# Patient Record
Sex: Male | Born: 1990 | Race: White | Hispanic: No | Marital: Single | Smoking: Current some day smoker
Health system: Southern US, Community
[De-identification: ages and names within clinical notes are randomized; demographics above are authoritative.]

## PROBLEM LIST (undated history)

## (undated) DIAGNOSIS — F209 Schizophrenia, unspecified: Secondary | ICD-10-CM

---
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
## 2012-05-23 ENCOUNTER — Emergency Department: Payer: Self-pay | Admitting: Emergency Medicine

## 2014-07-01 ENCOUNTER — Emergency Department: Payer: Self-pay | Admitting: Emergency Medicine

## 2014-11-02 ENCOUNTER — Emergency Department: Payer: Self-pay | Admitting: Internal Medicine

## 2014-11-02 LAB — URINALYSIS, COMPLETE
Bacteria: NONE SEEN
Bilirubin,UR: NEGATIVE
Blood: NEGATIVE
GLUCOSE, UR: NEGATIVE mg/dL (ref 0–75)
KETONE: NEGATIVE
Leukocyte Esterase: NEGATIVE
NITRITE: NEGATIVE
Ph: 6 (ref 4.5–8.0)
RBC,UR: 1 /HPF (ref 0–5)
Specific Gravity: 1.023 (ref 1.003–1.030)
Squamous Epithelial: <1

## 2014-11-02 LAB — COMPREHENSIVE METABOLIC PANEL
Albumin: 4.5 g/dL (ref 3.4–5.0)
Alkaline Phosphatase: 89 U/L
Anion Gap: 6 — ABNORMAL LOW (ref 7–16)
BUN: 14 mg/dL (ref 7–18)
Bilirubin,Total: 1.1 mg/dL — ABNORMAL HIGH (ref 0.2–1.0)
CHLORIDE: 103 mmol/L (ref 98–107)
Calcium, Total: 9.4 mg/dL (ref 8.5–10.1)
Co2: 26 mmol/L (ref 21–32)
Creatinine: 0.93 mg/dL (ref 0.60–1.30)
EGFR (African American): >60
EGFR (Non-African Amer.): >60
Glucose: 98 mg/dL (ref 65–99)
OSMOLALITY: 271 (ref 275–301)
POTASSIUM: 4.2 mmol/L (ref 3.5–5.1)
SGOT(AST): 19 U/L (ref 15–37)
SGPT (ALT): 29 U/L
Sodium: 135 mmol/L — ABNORMAL LOW (ref 136–145)
Total Protein: 8.1 g/dL (ref 6.4–8.2)

## 2014-11-02 LAB — CBC WITH DIFFERENTIAL/PLATELET
Basophil #: 0 10*3/uL (ref 0.0–0.1)
Basophil %: 0.4 %
EOS PCT: 0.6 %
Eosinophil #: 0.1 10*3/uL (ref 0.0–0.7)
HCT: 50.3 % (ref 40.0–52.0)
HGB: 16.9 g/dL (ref 13.0–18.0)
LYMPHS ABS: 2.3 10*3/uL (ref 1.0–3.6)
Lymphocyte %: 19 %
MCH: 29.6 pg (ref 26.0–34.0)
MCHC: 33.6 g/dL (ref 32.0–36.0)
MCV: 88 fL (ref 80–100)
Monocyte #: 0.8 x10 3/mm (ref 0.2–1.0)
Monocyte %: 6.3 %
Neutrophil #: 8.8 10*3/uL — ABNORMAL HIGH (ref 1.4–6.5)
Neutrophil %: 73.7 %
PLATELETS: 271 10*3/uL (ref 150–440)
RBC: 5.72 10*6/uL (ref 4.40–5.90)
RDW: 14.2 % (ref 11.5–14.5)
WBC: 11.9 10*3/uL — AB (ref 3.8–10.6)

## 2015-03-05 ENCOUNTER — Encounter (HOSPITAL_COMMUNITY): Payer: Self-pay | Admitting: Emergency Medicine

## 2015-03-05 ENCOUNTER — Emergency Department (HOSPITAL_COMMUNITY): Payer: No Typology Code available for payment source

## 2015-03-05 ENCOUNTER — Emergency Department (HOSPITAL_COMMUNITY)
Admission: EM | Admit: 2015-03-05 | Discharge: 2015-03-05 | Disposition: A | Discharge status: Home / Self Care | Payer: No Typology Code available for payment source | Attending: Emergency Medicine | Admitting: Emergency Medicine

## 2015-03-05 DIAGNOSIS — Y9389 Activity, other specified: Secondary | ICD-10-CM | POA: Diagnosis not present

## 2015-03-05 DIAGNOSIS — S3992XA Unspecified injury of lower back, initial encounter: Secondary | ICD-10-CM | POA: Diagnosis not present

## 2015-03-05 DIAGNOSIS — S3991XA Unspecified injury of abdomen, initial encounter: Secondary | ICD-10-CM | POA: Diagnosis not present

## 2015-03-05 DIAGNOSIS — F10129 Alcohol abuse with intoxication, unspecified: Secondary | ICD-10-CM | POA: Diagnosis not present

## 2015-03-05 DIAGNOSIS — Z720 Tobacco use: Secondary | ICD-10-CM | POA: Diagnosis not present

## 2015-03-05 DIAGNOSIS — Y998 Other external cause status: Secondary | ICD-10-CM | POA: Insufficient documentation

## 2015-03-05 DIAGNOSIS — S29092A Other injury of muscle and tendon of back wall of thorax, initial encounter: Secondary | ICD-10-CM | POA: Insufficient documentation

## 2015-03-05 DIAGNOSIS — S0990XA Unspecified injury of head, initial encounter: Secondary | ICD-10-CM

## 2015-03-05 DIAGNOSIS — Y92410 Unspecified street and highway as the place of occurrence of the external cause: Secondary | ICD-10-CM | POA: Insufficient documentation

## 2015-03-05 DIAGNOSIS — M549 Dorsalgia, unspecified: Secondary | ICD-10-CM

## 2015-03-05 LAB — CBC
HCT: 43.2 % (ref 39.0–52.0)
HEMOGLOBIN: 15.1 g/dL (ref 13.0–17.0)
MCH: 29.6 pg (ref 26.0–34.0)
MCHC: 35 g/dL (ref 30.0–36.0)
MCV: 84.7 fL (ref 78.0–100.0)
Platelets: 225 10*3/uL (ref 150–400)
RBC: 5.1 MIL/uL (ref 4.22–5.81)
RDW: 12.9 % (ref 11.5–15.5)
WBC: 7.9 10*3/uL (ref 4.0–10.5)

## 2015-03-05 LAB — COMPREHENSIVE METABOLIC PANEL
ALK PHOS: 81 U/L (ref 39–117)
ALT: 17 U/L (ref 0–53)
AST: 20 U/L (ref 0–37)
Albumin: 4.2 g/dL (ref 3.5–5.2)
Anion gap: 9 (ref 5–15)
BILIRUBIN TOTAL: 0.5 mg/dL (ref 0.3–1.2)
BUN: 10 mg/dL (ref 6–23)
CO2: 22 mmol/L (ref 19–32)
CREATININE: 1.16 mg/dL (ref 0.50–1.35)
Calcium: 8.5 mg/dL (ref 8.4–10.5)
Chloride: 109 mmol/L (ref 96–112)
GFR calc Af Amer: >90 mL/min (ref >90)
GFR calc non Af Amer: 89 mL/min — ABNORMAL LOW (ref >90)
Glucose, Bld: 91 mg/dL (ref 70–99)
POTASSIUM: 3.7 mmol/L (ref 3.5–5.1)
Sodium: 140 mmol/L (ref 135–145)
Total Protein: 6.8 g/dL (ref 6.0–8.3)

## 2015-03-05 LAB — ETHANOL: Alcohol, Ethyl (B): 211 mg/dL — ABNORMAL HIGH (ref 0–9)

## 2015-03-05 LAB — PROTIME-INR
INR: 1.04 (ref 0.00–1.49)
PROTHROMBIN TIME: 13.7 s (ref 11.6–15.2)

## 2015-03-05 LAB — SAMPLE TO BLOOD BANK

## 2015-03-05 MED ORDER — IOHEXOL 300 MG/ML  SOLN
100.0000 mL | Freq: Once | INTRAMUSCULAR | Status: AC | PRN
  Administered 2015-03-05: 100 mL via INTRAVENOUS

## 2015-03-05 MED ORDER — IBUPROFEN 800 MG PO TABS
800.0000 mg | ORAL_TABLET | Freq: Once | ORAL | Status: AC
  Administered 2015-03-05: 800 mg via ORAL
  Filled 2015-03-05: qty 1

## 2015-03-05 MED ORDER — NAPROXEN 500 MG PO TABS: 500.0000 mg | ORAL_TABLET | Freq: Two times a day (BID) | ORAL | Status: DC

## 2015-03-05 MED ORDER — SODIUM CHLORIDE 0.9 % IV BOLUS (SEPSIS)
1000.0000 mL | Freq: Once | INTRAVENOUS | Status: AC
  Administered 2015-03-05: 1000 mL via INTRAVENOUS

## 2015-03-05 MED ORDER — FENTANYL CITRATE 0.05 MG/ML IJ SOLN
50.0000 ug | INTRAMUSCULAR | Status: DC | PRN
  Administered 2015-03-05 (×2): 50 ug via INTRAVENOUS
  Filled 2015-03-05 (×2): qty 2

## 2015-03-05 MED ORDER — HYDROCODONE-ACETAMINOPHEN 5-325 MG PO TABS
2.0000 | ORAL_TABLET | Freq: Once | ORAL | Status: AC
  Administered 2015-03-05: 2 via ORAL
  Filled 2015-03-05: qty 2
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
## 2015-03-05 NOTE — Discharge Instructions (Signed)
If you were given medicines take as directed.  If you are on coumadin or contraceptives realize their levels and effectiveness is altered by many different medicines.  If you have any reaction (rash, tongues swelling, other) to the medicines stop taking and see a physician.   Please follow up as directed and return to the ER or see a physician for new or worsening symptoms.  Thank you. Filed Vitals:   03/05/15 1925 03/05/15 1927 03/05/15 2312  BP:  118/64 109/45  Pulse:  104 75  Temp:  98.7 F (37.1 C) 98.4 F (36.9 C)  TempSrc:  Oral Oral  Resp:  14 22  Height:  5\' 8"  (1.727 m)   Weight:  165 lb (74.844 kg)   SpO2: 96% 100% 99%
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
## 2015-03-07 ENCOUNTER — Encounter (HOSPITAL_COMMUNITY): Payer: Self-pay

## 2015-03-07 ENCOUNTER — Emergency Department (HOSPITAL_COMMUNITY)
Admission: EM | Admit: 2015-03-07 | Discharge: 2015-03-07 | Disposition: A | Discharge status: Home / Self Care | Payer: Self-pay | Attending: Emergency Medicine | Admitting: Emergency Medicine

## 2015-03-07 DIAGNOSIS — R110 Nausea: Secondary | ICD-10-CM

## 2015-03-07 DIAGNOSIS — Y92410 Unspecified street and highway as the place of occurrence of the external cause: Secondary | ICD-10-CM | POA: Insufficient documentation

## 2015-03-07 DIAGNOSIS — R51 Headache: Secondary | ICD-10-CM | POA: Insufficient documentation

## 2015-03-07 DIAGNOSIS — S39012A Strain of muscle, fascia and tendon of lower back, initial encounter: Secondary | ICD-10-CM

## 2015-03-07 DIAGNOSIS — R519 Headache, unspecified: Secondary | ICD-10-CM

## 2015-03-07 DIAGNOSIS — Y998 Other external cause status: Secondary | ICD-10-CM | POA: Insufficient documentation

## 2015-03-07 DIAGNOSIS — Y9389 Activity, other specified: Secondary | ICD-10-CM | POA: Insufficient documentation

## 2015-03-07 DIAGNOSIS — Z720 Tobacco use: Secondary | ICD-10-CM | POA: Insufficient documentation

## 2015-03-07 MED ORDER — ONDANSETRON 4 MG PO TBDP: ORAL_TABLET | ORAL | Status: DC

## 2015-03-07 MED ORDER — OXYCODONE-ACETAMINOPHEN 5-325 MG PO TABS: 2.0000 | ORAL_TABLET | Freq: Four times a day (QID) | ORAL | Status: DC | PRN

## 2015-03-07 MED ORDER — ONDANSETRON 4 MG PO TBDP
4.0000 mg | ORAL_TABLET | Freq: Once | ORAL | Status: AC
  Administered 2015-03-07: 4 mg via ORAL
  Filled 2015-03-07: qty 1

## 2015-03-07 MED ORDER — OXYCODONE-ACETAMINOPHEN 5-325 MG PO TABS
2.0000 | ORAL_TABLET | Freq: Once | ORAL | Status: AC
  Administered 2015-03-07: 2 via ORAL
  Filled 2015-03-07: qty 2

## 2015-03-07 MED ORDER — CYCLOBENZAPRINE HCL 10 MG PO TABS: 10.0000 mg | ORAL_TABLET | Freq: Two times a day (BID) | ORAL | Status: DC | PRN
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
## 2015-09-21 ENCOUNTER — Emergency Department
Admission: EM | Admit: 2015-09-21 | Discharge: 2015-09-21 | Disposition: A | Discharge status: Home / Self Care | Payer: BLUE CROSS/BLUE SHIELD | Attending: Emergency Medicine | Admitting: Emergency Medicine

## 2015-09-21 ENCOUNTER — Emergency Department: Payer: BLUE CROSS/BLUE SHIELD

## 2015-09-21 ENCOUNTER — Encounter: Payer: Self-pay | Admitting: *Deleted

## 2015-09-21 DIAGNOSIS — M25571 Pain in right ankle and joints of right foot: Secondary | ICD-10-CM | POA: Diagnosis not present

## 2015-09-21 DIAGNOSIS — Z720 Tobacco use: Secondary | ICD-10-CM | POA: Insufficient documentation

## 2015-09-21 DIAGNOSIS — Z79899 Other long term (current) drug therapy: Secondary | ICD-10-CM | POA: Diagnosis not present

## 2015-09-21 MED ORDER — NAPROXEN 500 MG PO TABS: 500.0000 mg | ORAL_TABLET | Freq: Two times a day (BID) | ORAL | Status: DC

## 2015-09-21 MED ORDER — IBUPROFEN 800 MG PO TABS
800.0000 mg | ORAL_TABLET | Freq: Once | ORAL | Status: AC
  Administered 2015-09-21: 800 mg via ORAL
  Filled 2015-09-21: qty 1

## 2015-09-21 MED ORDER — TRAMADOL HCL 50 MG PO TABS
50.0000 mg | ORAL_TABLET | Freq: Once | ORAL | Status: AC
  Administered 2015-09-21: 50 mg via ORAL
  Filled 2015-09-21: qty 1

## 2015-09-21 NOTE — Discharge Instructions (Signed)
Wear ankle support for 3-5 days as needed. Ankle Pain Ankle pain is a common symptom. The bones, cartilage, tendons, and muscles of the ankle joint perform a lot of work each day. The ankle joint holds your body weight and allows you to move around. Ankle pain can occur on either side or back of 1 or both ankles. Ankle pain may be sharp and burning or dull and aching. There may be tenderness, stiffness, redness, or warmth around the ankle. The pain occurs more often when a person walks or puts pressure on the ankle. CAUSES  There are many reasons ankle pain can develop. It is important to work with your caregiver to identify the cause since many conditions can impact the bones, cartilage, muscles, and tendons. Causes for ankle pain include:  Injury, including a break (fracture), sprain, or strain often due to a fall, sports, or a high-impact activity.  Swelling (inflammation) of a tendon (tendonitis).  Achilles tendon rupture.  Ankle instability after repeated sprains and strains.  Poor foot alignment.  Pressure on a nerve (tarsal tunnel syndrome).  Arthritis in the ankle or the lining of the ankle.  Crystal formation in the ankle (gout or pseudogout). DIAGNOSIS  A diagnosis is based on your medical history, your symptoms, results of your physical exam, and results of diagnostic tests. Diagnostic tests may include X-ray exams or a computerized magnetic scan (magnetic resonance imaging, MRI). TREATMENT  Treatment will depend on the cause of your ankle pain and may include:  Keeping pressure off the ankle and limiting activities.  Using crutches or other walking support (a cane or brace).  Using rest, ice, compression, and elevation.  Participating in physical therapy or home exercises.  Wearing shoe inserts or special shoes.  Losing weight.  Taking medications to reduce pain or swelling or receiving an injection.  Undergoing surgery. HOME CARE INSTRUCTIONS   Only take  over-the-counter or prescription medicines for pain, discomfort, or fever as directed by your caregiver.  Put ice on the injured area.  Put ice in a plastic bag.  Place a towel between your skin and the bag.  Leave the ice on for 15-20 minutes at a time, 03-04 times a day.  Keep your leg raised (elevated) when possible to lessen swelling.  Avoid activities that cause ankle pain.  Follow specific exercises as directed by your caregiver.  Record how often you have ankle pain, the location of the pain, and what it feels like. This information may be helpful to you and your caregiver.  Ask your caregiver about returning to work or sports and whether you should drive.  Follow up with your caregiver for further examination, therapy, or testing as directed. SEEK MEDICAL CARE IF:   Pain or swelling continues or worsens beyond 1 week.  You have an oral temperature above 102 F (38.9 C).  You are feeling unwell or have chills.  You are having an increasingly difficult time with walking.  You have loss of sensation or other new symptoms.  You have questions or concerns. MAKE SURE YOU:   Understand these instructions.  Will watch your condition.  Will get help right away if you are not doing well or get worse.   This information is not intended to replace advice given to you by your health care provider. Make sure you discuss any questions you have with your health care provider.   Document Released: 05/09/2010 Document Revised: 02/11/2012 Document Reviewed: 06/21/2015 Elsevier Interactive Patient Education [employer]2016 [employer]
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
## 2015-09-21 NOTE — ED Provider Notes (Signed)
[facility] Emergency Department Provider Note  ____________________________________________  Time seen: Approximately 11:33 AM  I have reviewed the triage vital signs and the nursing notes.   HISTORY  Chief Complaint Ankle Pain    HPI Jeremiah Newman is a 24 y.o. male patient complaining of right ankle pain and edema. Patient state he was standing this morning and felt like ankle collapsed. Patient has surgery 1 year ago to the right ankle. Patient denies any internal fixation. Patient is rating his pain as a 7/10. No palliative measures taken for this complaint.   History reviewed. No pertinent past medical history.  There are no active problems to display for this patient.   History reviewed. No pertinent past surgical history.  Current Outpatient Rx  Name  Route  Sig  Dispense  Refill  . cyclobenzaprine (FLEXERIL) 10 MG tablet   Oral   Take 1 tablet (10 mg total) by mouth 2 (two) times daily as needed for muscle spasms.   10 tablet   0   . naproxen (NAPROSYN) 500 MG tablet   Oral   Take 1 tablet (500 mg total) by mouth 2 (two) times daily.   10 tablet   0   . ondansetron (ZOFRAN ODT) 4 MG disintegrating tablet       ODT q4 hours prn nausea/vomit   20 tablet   0   . oxyCODONE-acetaminophen (PERCOCET) 5-325 MG per tablet   Oral   Take 2 tablets by mouth every 6 (six) hours as needed for moderate pain.   15 tablet   0     Allergies Review of patient's allergies indicates no known allergies.  No family history on file.  Social History Social History  Substance Use Topics  . Smoking status: Current Some Day Smoker  . Smokeless tobacco: None  . Alcohol Use: Yes    Review of Systems Constitutional: No fever/chills Eyes: No visual changes. ENT: No sore throat. Cardiovascular: Denies chest pain. Respiratory: Denies shortness of breath. Gastrointestinal: No abdominal pain.  No nausea, no vomiting.  No diarrhea.  No  constipation. Genitourinary: Negative for dysuria. Musculoskeletal: Right ankle pain  Skin: Negative for rash. Neurological: Negative for headaches, focal weakness or numbness. 10-point ROS otherwise negative.  ____________________________________________   PHYSICAL EXAM:  VITAL SIGNS: ED Triage Vitals  Enc Vitals Group     BP 09/21/15 1115 137/71 mmHg     Pulse Rate 09/21/15 1115 80     Resp 09/21/15 1115 18     Temp 09/21/15 1115 98 F (36.7 C)     Temp Source 09/21/15 1115 Oral     SpO2 09/21/15 1115 97 %     Weight 09/21/15 1115 170 lb (77.111 kg)     Height 09/21/15 1115  (1.727 m)     Head Cir --      Peak Flow --      Pain Score 09/21/15 1114 7     Pain Loc --      Pain Edu? --      Excl. in GC? --     Constitutional: Alert and oriented. Well appearing and in no acute distress. Eyes: Conjunctivae are normal. PERRL. EOMI. Head: Atraumatic. Nose: No congestion/rhinnorhea. Mouth/Throat: Mucous membranes are moist.  Oropharynx non-erythematous. Neck: No stridor.  No cervical spine tenderness to palpation. Hematological/Lymphatic/Immunilogical: No cervical lymphadenopathy. Cardiovascular: Normal rate, regular rhythm. Grossly normal heart sounds.  Good peripheral circulation. Respiratory: Normal respiratory effort.  No retractions. Lungs CTAB. Gastrointestinal: Soft and  nontender. No distention. No abdominal bruits. No CVA tenderness. Musculoskeletal:Deformity to the right medial malleolus. Surgical scar consistent with history. Moderate guarding palpation of the medial malleolus. Patient has decreased 2. discrimination to the great toe. Patient has full nuchal range of motion of the ankle.  Neurologic:  Normal speech and language. No gross focal neurologic deficits are appreciated. No gait instability. Skin:  Skin is warm, dry and intact. No rash noted. Psychiatric: Mood and affect are normal. Speech and behavior are  normal.  ____________________________________________   LABS (all labs ordered are listed, but only abnormal results are displayed)  Labs Reviewed - No data to display ____________________________________________  EKG   ____________________________________________  RADIOLOGY  No acute findings on x-ray. I, Joni Reiningonald K Smith, personally viewed and evaluated these images (plain radiographs) as part of my medical decision making.   ____________________________________________   PROCEDURES  Procedure(s) performed: None  Critical Care performed: No  ____________________________________________   INITIAL IMPRESSION / ASSESSMENT AND PLAN / ED COURSE  Pertinent labs & imaging results that were available during my care of the patient were reviewed by me and considered in my medical decision making (see chart for details). Sprain right ankle. Discussed x-ray findings with patient. Patient placed in a Velcro ankle splint. Patient given a prescription for naproxen. Patient [facility] this condition persists. ____________________________________________   FINAL CLINICAL IMPRESSION(S) / ED DIAGNOSES  Final diagnoses:  Ankle pain, right      Joni ReiningRonald K Smith, PA-C 09/21/15 1214  Darien Ramusavid W Kaminski, MD 09/21/15 [phone]

## 2015-12-08 ENCOUNTER — Emergency Department: Payer: BLUE CROSS/BLUE SHIELD

## 2015-12-08 ENCOUNTER — Emergency Department
Admission: EM | Admit: 2015-12-08 | Discharge: 2015-12-08 | Disposition: A | Discharge status: Home / Self Care | Payer: BLUE CROSS/BLUE SHIELD | Attending: Emergency Medicine | Admitting: Emergency Medicine

## 2015-12-08 DIAGNOSIS — S4991XA Unspecified injury of right shoulder and upper arm, initial encounter: Secondary | ICD-10-CM | POA: Diagnosis present

## 2015-12-08 DIAGNOSIS — Y9289 Other specified places as the place of occurrence of the external cause: Secondary | ICD-10-CM | POA: Diagnosis not present

## 2015-12-08 DIAGNOSIS — M542 Cervicalgia: Secondary | ICD-10-CM | POA: Insufficient documentation

## 2015-12-08 DIAGNOSIS — R221 Localized swelling, mass and lump, neck: Secondary | ICD-10-CM | POA: Diagnosis not present

## 2015-12-08 DIAGNOSIS — Y9389 Activity, other specified: Secondary | ICD-10-CM | POA: Diagnosis not present

## 2015-12-08 DIAGNOSIS — F17200 Nicotine dependence, unspecified, uncomplicated: Secondary | ICD-10-CM | POA: Insufficient documentation

## 2015-12-08 DIAGNOSIS — M62838 Other muscle spasm: Secondary | ICD-10-CM | POA: Insufficient documentation

## 2015-12-08 DIAGNOSIS — X58XXXA Exposure to other specified factors, initial encounter: Secondary | ICD-10-CM | POA: Diagnosis not present

## 2015-12-08 DIAGNOSIS — S46911A Strain of unspecified muscle, fascia and tendon at shoulder and upper arm level, right arm, initial encounter: Secondary | ICD-10-CM | POA: Diagnosis not present

## 2015-12-08 DIAGNOSIS — Y998 Other external cause status: Secondary | ICD-10-CM | POA: Insufficient documentation

## 2015-12-08 DIAGNOSIS — Z79899 Other long term (current) drug therapy: Secondary | ICD-10-CM | POA: Insufficient documentation

## 2015-12-08 DIAGNOSIS — Z791 Long term (current) use of non-steroidal anti-inflammatories (NSAID): Secondary | ICD-10-CM | POA: Diagnosis not present

## 2015-12-08 DIAGNOSIS — T148XXA Other injury of unspecified body region, initial encounter: Secondary | ICD-10-CM

## 2015-12-08 MED ORDER — OXYCODONE-ACETAMINOPHEN 5-325 MG PO TABS
1.0000 | ORAL_TABLET | Freq: Once | ORAL | Status: AC
  Administered 2015-12-08: 1 via ORAL
  Filled 2015-12-08: qty 1

## 2015-12-08 MED ORDER — DIAZEPAM 2 MG PO TABS
2.0000 mg | ORAL_TABLET | Freq: Once | ORAL | Status: AC
  Administered 2015-12-08: 2 mg via ORAL
  Filled 2015-12-08: qty 1

## 2015-12-08 MED ORDER — OXYCODONE-ACETAMINOPHEN 5-325 MG PO TABS: 1.0000 | ORAL_TABLET | ORAL | Status: DC | PRN

## 2015-12-08 MED ORDER — IBUPROFEN 800 MG PO TABS: 800.0000 mg | ORAL_TABLET | Freq: Three times a day (TID) | ORAL | Status: DC | PRN

## 2015-12-08 MED ORDER — DIAZEPAM 2 MG PO TABS: 2.0000 mg | ORAL_TABLET | Freq: Three times a day (TID) | ORAL | Status: DC | PRN

## 2015-12-08 MED ORDER — KETOROLAC TROMETHAMINE 30 MG/ML IJ SOLN
60.0000 mg | Freq: Once | INTRAMUSCULAR | Status: AC
  Administered 2015-12-08: 60 mg via INTRAMUSCULAR
  Filled 2015-12-08: qty 2

## 2015-12-08 MED ORDER — OXYCODONE-ACETAMINOPHEN 5-325 MG PO TABS
1.0000 | ORAL_TABLET | Freq: Once | ORAL | Status: AC
  Administered 2015-12-08: 1 via ORAL

## 2015-12-08 NOTE — Discharge Instructions (Signed)
1. Take medicines as needed for pain and muscle spasms (Motrin/Percocet/Valium #15). 2. Wear sling as needed for comfort. 3. Apply ice several times daily. 4. Return to the ER for worsening symptoms, persistent vomiting, difficulty breathing or other concerns.  Muscle Cramps and Spasms Muscle cramps and spasms occur when a muscle or muscles tighten and you have no control over this tightening (involuntary muscle contraction). They are a common problem and can develop in any muscle. The most common place is in the calf muscles of the leg. Both muscle cramps and muscle spasms are involuntary muscle contractions, but they also have differences:   Muscle cramps are sporadic and painful. They may last a few seconds to a quarter of an hour. Muscle cramps are often more forceful and last longer than muscle spasms.  Muscle spasms may or may not be painful. They may also last just a few seconds or much longer. CAUSES  It is uncommon for cramps or spasms to be due to a serious underlying problem. In many cases, the cause of cramps or spasms is unknown. Some common causes are:   Overexertion.   Overuse from repetitive motions (doing the same thing over and over).   Remaining in a certain position for a long period of time.   Improper preparation, form, or technique while performing a sport or activity.   Dehydration.   Injury.   Side effects of some medicines.   Abnormally low levels of the salts and ions in your blood (electrolytes), especially potassium and calcium. This could happen if you are taking water pills (diuretics) or you are pregnant.  Some underlying medical problems can make it more likely to develop cramps or spasms. These include, but are not limited to:   Diabetes.   Parkinson disease.   Hormone disorders, such as thyroid problems.   Alcohol abuse.   Diseases specific to muscles, joints, and bones.   Blood vessel disease where not enough blood is getting  to the muscles.  HOME CARE INSTRUCTIONS   Stay well hydrated. Drink enough water and fluids to keep your urine clear or pale yellow.  It may be helpful to massage, stretch, and relax the affected muscle.  For tight or tense muscles, use a warm towel, heating pad, or hot shower water directed to the affected area.  If you are sore or have pain after a cramp or spasm, applying ice to the affected area may relieve discomfort.  Put ice in a plastic bag.  Place a towel between your skin and the bag.  Leave the ice on for 15-20 minutes, 03-04 times a day.  Medicines used to treat a known cause of cramps or spasms may help reduce their frequency or severity. Only take over-the-counter or prescription medicines as directed by your caregiver. SEEK MEDICAL CARE IF:  Your cramps or spasms get more severe, more frequent, or do not improve over time.  MAKE SURE YOU:   Understand these instructions.  Will watch your condition.  Will get help right away if you are not doing well or get worse.   This information is not intended to replace advice given to you by your health care provider. Make sure you discuss any questions you have with your health care provider.   Document Released: 05/11/2002 Document Revised: 03/16/2013 Document Reviewed: 11/05/2012 Elsevier Interactive Patient Education [employer]2016 Elsevier Inc.

## 2015-12-08 NOTE — ED Notes (Signed)
Patient was bench pressing 200 pounds and felt something in right shoulder pop. Now with swelling of trapezius muscle on the right and difficulty straightening right arm. Patient states he has used ice and tried to sleep but can't take the pain anymore.

## 2015-12-08 NOTE — ED Provider Notes (Signed)
[facility] Emergency Department Provider Note  ____________________________________________  Time seen: Approximately 4:03 AM  I have reviewed the triage vital signs and the nursing notes.   HISTORY  Chief Complaint Shoulder Pain    HPI Jeremiah Newman is a 25 y.o. male who presents to the ED from home with a chief complaint of right shoulder and neck pain. Patient reports he was bench pressing 200 pounds, extended his right arm too wide and felt something pop in his right shoulder. Presents with swelling to his neck and shoulder with limited range of motion. Patient is left-hand dominant. Tried ice without relief. Denies associated headache, vision changes, weakness, numbness, tingling, chest pain, shortness of breath, abdominal pain, nausea, vomiting, diarrhea.   Past medical history None  There are no active problems to display for this patient.   History reviewed. No pertinent past surgical history.  Current Outpatient Rx  Name  Route  Sig  Dispense  Refill  . cyclobenzaprine (FLEXERIL) 10 MG tablet   Oral   Take 1 tablet (10 mg total) by mouth 2 (two) times daily as needed for muscle spasms.   10 tablet   0   . naproxen (NAPROSYN) 500 MG tablet   Oral   Take 1 tablet (500 mg total) by mouth 2 (two) times daily.   10 tablet   0   . naproxen (NAPROSYN) 500 MG tablet   Oral   Take 1 tablet (500 mg total) by mouth 2 (two) times daily with a meal.   20 tablet   0   . ondansetron (ZOFRAN ODT) 4 MG disintegrating tablet      4mg  ODT q4 hours prn nausea/vomit   20 tablet   0   . oxyCODONE-acetaminophen (PERCOCET) 5-325 MG per tablet   Oral   Take 2 tablets by mouth every 6 (six) hours as needed for moderate pain.   15 tablet   0     Allergies Review of patient's allergies indicates no known allergies.  No family history on file.  Social History Social History  Substance Use Topics  . Smoking status: Current Some Day Smoker   . Smokeless tobacco: None  . Alcohol Use: Yes    Review of Systems Constitutional: No fever/chills Eyes: No visual changes. ENT: No sore throat. Cardiovascular: Denies chest pain. Respiratory: Denies shortness of breath. Gastrointestinal: No abdominal pain.  No nausea, no vomiting.  No diarrhea.  No constipation. Genitourinary: Negative for dysuria. Musculoskeletal: Positive for right shoulder and neck pain. Negative for back pain. Skin: Negative for rash. Neurological: Negative for headaches, focal weakness or numbness.  10-point ROS otherwise negative.  ____________________________________________   PHYSICAL EXAM:  VITAL SIGNS: ED Triage Vitals  Enc Vitals Group     BP 12/08/15 0112 148/120 mmHg     Pulse Rate 12/08/15 0112 83     Resp 12/08/15 0112 22     Temp 12/08/15 0112 98 F (36.7 C)     Temp Source 12/08/15 0112 Oral     SpO2 12/08/15 0112 99 %     Weight 12/08/15 0112 170 lb (77.111 kg)     Height 12/08/15 0112 5\' 7"  (1.702 m)     Head Cir --      Peak Flow --      Pain Score 12/08/15 0113 10     Pain Loc --      Pain Edu? --      Excl. in GC? --     Constitutional:  Alert and oriented. Well appearing and in mild acute distress. Eyes: Conjunctivae are normal. PERRL. EOMI. Head: Atraumatic. Nose: No congestion/rhinnorhea. Mouth/Throat: Mucous membranes are moist.  Oropharynx non-erythematous. Neck: No stridor.  No cervical spine tenderness to palpation.  No carotid bruits.  Right trapezius (superior shoulder and neck) area with mild swelling, tender to palpation, limited range of motion secondary to pain. Cardiovascular: Normal rate, regular rhythm. Grossly normal heart sounds.  Good peripheral circulation. Respiratory: Normal respiratory effort.  No retractions. Lungs CTAB. Gastrointestinal: Soft and nontender. No distention. No abdominal bruits. No CVA tenderness. Musculoskeletal: See above under neck. No lower extremity tenderness nor edema.  No joint  effusions. Neurologic:  Normal speech and language. No gross focal neurologic deficits are appreciated. No gait instability. Skin:  Skin is warm, dry and intact. No rash noted. Psychiatric: Mood and affect are normal. Speech and behavior are normal.  ____________________________________________   LABS (all labs ordered are listed, but only abnormal results are displayed)  Labs Reviewed - No data to display ____________________________________________  EKG  None ____________________________________________  RADIOLOGY  Right shoulder x-rays (viewed by me, interpreted per Dr. Jake Sharkhaney): No evidence of fracture or dislocation. ____________________________________________   PROCEDURES  Procedure(s) performed: None  Critical Care performed: No  ____________________________________________   INITIAL IMPRESSION / ASSESSMENT AND PLAN / ED COURSE  Pertinent labs & imaging results that were available during my care of the patient were reviewed by me and considered in my medical decision making (see chart for details).  25 year old male with trapezius tear/strain. Patient received 2 Percocets while awaiting treatment room. Will administer IM Toradol, and Valium for muscle relaxation, place and sling with orthopedics follow-up. Strict return precautions given. Patient verbalizes understanding and agrees with plan of care. ____________________________________________   FINAL CLINICAL IMPRESSION(S) / ED DIAGNOSES  Final diagnoses:  Muscle strain  Muscle spasm      Irean HongJade J Sung, MD 12/08/15 [id number]

## 2016-06-11 ENCOUNTER — Emergency Department
Admission: EM | Admit: 2016-06-11 | Discharge: 2016-06-11 | Disposition: A | Discharge status: Home / Self Care | Payer: BLUE CROSS/BLUE SHIELD | Attending: Emergency Medicine | Admitting: Emergency Medicine

## 2016-06-11 ENCOUNTER — Encounter: Payer: Self-pay | Admitting: Emergency Medicine

## 2016-06-11 ENCOUNTER — Emergency Department: Payer: BLUE CROSS/BLUE SHIELD

## 2016-06-11 DIAGNOSIS — Z79899 Other long term (current) drug therapy: Secondary | ICD-10-CM | POA: Diagnosis not present

## 2016-06-11 DIAGNOSIS — F17210 Nicotine dependence, cigarettes, uncomplicated: Secondary | ICD-10-CM | POA: Diagnosis not present

## 2016-06-11 DIAGNOSIS — J9801 Acute bronchospasm: Secondary | ICD-10-CM | POA: Diagnosis not present

## 2016-06-11 DIAGNOSIS — Z791 Long term (current) use of non-steroidal anti-inflammatories (NSAID): Secondary | ICD-10-CM | POA: Diagnosis not present

## 2016-06-11 DIAGNOSIS — R05 Cough: Secondary | ICD-10-CM | POA: Diagnosis present

## 2016-06-11 MED ORDER — BENZONATATE 100 MG PO CAPS
200.0000 mg | ORAL_CAPSULE | Freq: Once | ORAL | Status: AC
  Administered 2016-06-11: 200 mg via ORAL
  Filled 2016-06-11: qty 2

## 2016-06-11 MED ORDER — BENZONATATE 100 MG PO CAPS: 100.0000 mg | ORAL_CAPSULE | Freq: Three times a day (TID) | ORAL | Status: AC | PRN

## 2016-06-11 MED ORDER — METHYLPREDNISOLONE 4 MG PO TBPK: ORAL_TABLET | ORAL | Status: DC

## 2016-06-11 MED ORDER — DEXAMETHASONE SODIUM PHOSPHATE 10 MG/ML IJ SOLN
10.0000 mg | Freq: Once | INTRAMUSCULAR | Status: AC
  Administered 2016-06-11: 10 mg via INTRAMUSCULAR
  Filled 2016-06-11: qty 1

## 2016-06-11 NOTE — ED Provider Notes (Signed)
[facility] Emergency Department Provider Note   ____________________________________________  Time seen: Approximately 1:18 PM  I have reviewed the triage vital signs and the nursing notes.   HISTORY  Chief Complaint Cough    HPI Jeremiah Newman is a 25 y.o. male presenting with cough and congestion for 1 week. Cough is described as productive with yellow sputum and morning blood and has woken him from sleep. Currently experiencing 8/10 musculoskeletal thoracic pain due to persistent coughing. Current smoker with a 5 pack year history. Has tried multiple palliative measures without relief. Associated symptoms include nausea, diarrhea, headaches, decreased appetite, skin sensitivity, and itchy eyes. Denies vomiting, fever, and eye discharge.   History reviewed. No pertinent past medical history.  There are no active problems to display for this patient.   History reviewed. No pertinent past surgical history.  Current Outpatient Rx  Name  Route  Sig  Dispense  Refill  . benzonatate (TESSALON PERLES) 100 MG capsule   Oral   Take 1 capsule (100 mg total) by mouth 3 (three) times daily as needed for cough.   30 capsule   0   . cyclobenzaprine (FLEXERIL) 10 MG tablet   Oral   Take 1 tablet (10 mg total) by mouth 2 (two) times daily as needed for muscle spasms.   10 tablet   0   . diazepam (VALIUM) 2 MG tablet   Oral   Take 1 tablet (2 mg total) by mouth every 8 (eight) hours as needed for muscle spasms.   15 tablet   0   . ibuprofen (ADVIL,MOTRIN) 800 MG tablet   Oral   Take 1 tablet (800 mg total) by mouth every 8 (eight) hours as needed for moderate pain.   15 tablet   0   . methylPREDNISolone (MEDROL DOSEPAK) 4 MG TBPK tablet      Take Tapered dose as directed   21 tablet   0   . naproxen (NAPROSYN) 500 MG tablet   Oral   Take 1 tablet (500 mg total) by mouth 2 (two) times daily.   10 tablet   0   . naproxen (NAPROSYN) 500 MG  tablet   Oral   Take 1 tablet (500 mg total) by mouth 2 (two) times daily with a meal.   20 tablet   0   . ondansetron (ZOFRAN ODT) 4 MG disintegrating tablet       ODT q4 hours prn nausea/vomit   20 tablet   0   . oxyCODONE-acetaminophen (ROXICET) 5-325 MG tablet   Oral   Take 1 tablet by mouth every 4 (four) hours as needed for severe pain.   15 tablet   0     Allergies Review of patient's allergies indicates no known allergies.  No family history on file.  Social History Social History  Substance Use Topics  . Smoking status: Current Some Day Smoker -- 1.00 packs/day    Types: Cigarettes  . Smokeless tobacco: None  . Alcohol Use: Yes    Review of Systems Constitutional: No fever/chills Eyes: No visual changes. ENT: No sore throat. rhinorrhea Cardiovascular: Denies chest pain. Respiratory: Denies shortness of breath. Productive cough  Gastrointestinal: No abdominal pain.  No nausea, no vomiting.  No diarrhea.  No constipation. Genitourinary: Negative for dysuria. Musculoskeletal: As wall pain  Skin: Negative for rash. Neurological: Negative for headaches, focal weakness or numbness.    ____________________________________________   PHYSICAL EXAM:  VITAL SIGNS: ED Triage Vitals  Enc Vitals Group     BP 06/11/16 1254 116/86 mmHg     Pulse Rate 06/11/16 1254 76     Resp 06/11/16 1254 18     Temp 06/11/16 1254 98.4 F (36.9 C)     Temp src --      SpO2 06/11/16 1254 96 %     Weight 06/11/16 1254 168 lb (76.204 kg)     Height 06/11/16 1254 5\' 8"  (1.727 m)     Head Cir --      Peak Flow --      Pain Score 06/11/16 1255 5     Pain Loc --      Pain Edu? --      Excl. in GC? --     Constitutional: Alert and oriented. Well appearing and in no acute distress. Eyes: Conjunctivae are normal. PERRL. EOMI. Head: Atraumatic. Nose: No congestion/rhinnorhea. Mouth/Throat: Mucous membranes are moist.  Oropharynx non-erythematous. Neck: No stridor.  No  cervical spine tenderness to palpation. Hematological/Lymphatic/Immunilogical: No cervical lymphadenopathy. Cardiovascular: Normal rate, regular rhythm. Grossly normal heart sounds.  Good peripheral circulation. Respiratory: Normal respiratory effort.  No retractions. Lungs CTAB. Gastrointestinal: Soft and nontender. No distention. No abdominal bruits. No CVA tenderness. Musculoskeletal: No lower extremity tenderness nor edema.  No joint effusions. Neurologic:  Normal speech and language. No gross focal neurologic deficits are appreciated. No gait instability. Skin:  Skin is warm, dry and intact. No rash noted. Psychiatric: Mood and affect are normal. Speech and behavior are normal.  ____________________________________________   LABS (all labs ordered are listed, but only abnormal results are displayed)  Labs Reviewed - No data to display ____________________________________________  EKG   ____________________________________________  RADIOLOGY  This x-ray unremarkable except for some mild bronchitic changes ____________________________________________   PROCEDURES  Procedure(s) performed: None  Procedures  Critical Care performed: No  ____________________________________________   INITIAL IMPRESSION / ASSESSMENT AND PLAN / ED COURSE  Pertinent labs & imaging results that were available during my care of the patient were reviewed by me and considered in my medical decision making (see chart for details).  All secondary to bronchospasms. Discussed x-ray finding with patient. Patient given discharge care instructions. Patient given a prescription for the Medrol Dosepak and Tessalon Perles. Patient given a work excuse for 2 days. Ad[facility] condition [facility]. ____________________________________________   FINAL CLINICAL IMPRESSION(S) / ED DIAGNOSES  Final diagnoses:  Cough due to bronchospasm      NEW MEDICATIONS  STARTED DURING THIS VISIT:  New Prescriptions   BENZONATATE (TESSALON PERLES) 100 MG CAPSULE    Take 1 capsule (100 mg total) by mouth 3 (three) times daily as needed for cough.   METHYLPREDNISOLONE (MEDROL DOSEPAK) 4 MG TBPK TABLET    Take Tapered dose as directed     Note:  This document was prepared using Dragon voice recognition software and may include unintentional dictation errors.    Joni Reiningonald K Smith, PA-C 06/11/16 1431  Jene Everyobert Kinner, MD 06/11/16 [phone]

## 2016-06-11 NOTE — ED Notes (Signed)
Cough and congestion x 1 week 

## 2016-06-11 NOTE — Discharge Instructions (Signed)
Bronchospasm, Adult A bronchospasm is when the tubes that carry air in and out of your lungs (airways) spasm or tighten. During a bronchospasm it is hard to breathe. This is because the airways get smaller. A bronchospasm can be triggered by:  Allergies. These may be to animals, pollen, food, or mold.  Infection. This is a common cause of bronchospasm.  Exercise.  Irritants. These include pollution, cigarette smoke, strong odors, aerosol sprays, and paint fumes.  Weather changes.  Stress.  Being emotional. HOME CARE   Always have a plan for getting help. Know when to call your doctor and local emergency services (911 in the [place] Know where you can get emergency care.  Only take medicines as told by your doctor.  If you were prescribed an inhaler or nebulizer machine, ask your doctor how to use it correctly. Always use a spacer with your inhaler if you were given one.  Stay calm during an attack. Try to relax and breathe more slowly.  Control your home environment:  Change your heating and air conditioning filter at least once a month.  Limit your use of fireplaces and wood stoves.  Do not  smoke. Do not  allow smoking in your home.  Avoid perfumes and fragrances.  Get rid of pests (such as roaches and mice) and their droppings.  Throw away plants if you see mold on them.  Keep your house clean and dust free.  Replace carpet with wood, tile, or vinyl flooring. Carpet can trap dander and dust.  Use allergy-proof pillows, mattress covers, and box spring covers.  Wash bed sheets and blankets every week in hot water. Dry them in a dryer.  Use blankets that are made of polyester or cotton.  Wash hands frequently. GET HELP IF:  You have muscle aches.  You have chest pain.  The thick spit you spit or cough up (sputum) changes from clear or white to yellow, green, gray, or bloody.  The thick spit you spit or cough up gets thicker.  There are problems that may be  related to the medicine you are given such as:  A rash.  Itching.  Swelling.  Trouble breathing. GET HELP RIGHT AWAY IF:  You feel you cannot breathe or catch your breath.  You cannot stop coughing.  Your treatment is not helping you breathe better.  You have very bad chest pain. MAKE SURE YOU:   Understand these instructions.  Will watch your condition.  Will get help right away if you are not doing well or get worse.   This information is not intended to replace advice given to you by your health care provider. Make sure you discuss any questions you have with your health care provider.   Document Released: 09/16/2009 Document Revised: 12/10/2014 Document Reviewed: 05/12/2013 Elsevier Interactive Patient Education 2016 Else[employer] Inc.  

## 2016-06-11 NOTE — ED Notes (Signed)
Cough times 1 week  Non productive  Afebrile on arrival  No resp distress noted

## 2016-06-12 ENCOUNTER — Emergency Department
Admission: EM | Admit: 2016-06-12 | Discharge: 2016-06-12 | Disposition: A | Discharge status: Home / Self Care | Payer: BLUE CROSS/BLUE SHIELD | Attending: Emergency Medicine | Admitting: Emergency Medicine

## 2016-06-12 ENCOUNTER — Encounter: Payer: Self-pay | Admitting: Emergency Medicine

## 2016-06-12 DIAGNOSIS — J40 Bronchitis, not specified as acute or chronic: Secondary | ICD-10-CM | POA: Diagnosis not present

## 2016-06-12 DIAGNOSIS — F17210 Nicotine dependence, cigarettes, uncomplicated: Secondary | ICD-10-CM | POA: Diagnosis not present

## 2016-06-12 DIAGNOSIS — R05 Cough: Secondary | ICD-10-CM | POA: Diagnosis present

## 2016-06-12 MED ORDER — ALBUTEROL SULFATE (2.5 MG/3ML) 0.083% IN NEBU
2.5000 mg | INHALATION_SOLUTION | Freq: Once | RESPIRATORY_TRACT | Status: AC
  Administered 2016-06-12: 2.5 mg via RESPIRATORY_TRACT
  Filled 2016-06-12: qty 3

## 2016-06-12 MED ORDER — AZITHROMYCIN 250 MG PO TABS: ORAL_TABLET | ORAL | Status: DC

## 2016-06-12 MED ORDER — ALBUTEROL SULFATE HFA 108 (90 BASE) MCG/ACT IN AERS: 2.0000 | INHALATION_SPRAY | Freq: Four times a day (QID) | RESPIRATORY_TRACT | Status: DC | PRN

## 2016-06-12 MED ORDER — IPRATROPIUM-ALBUTEROL 0.5-2.5 (3) MG/3ML IN SOLN
3.0000 mL | Freq: Once | RESPIRATORY_TRACT | Status: AC
  Administered 2016-06-12: 3 mL via RESPIRATORY_TRACT
  Filled 2016-06-12: qty 3

## 2016-06-12 MED ORDER — GUAIFENESIN-CODEINE 100-10 MG/5ML PO SYRP: 5.0000 mL | ORAL_SOLUTION | Freq: Three times a day (TID) | ORAL | Status: DC | PRN

## 2016-06-12 NOTE — Discharge Instructions (Signed)
How to Use an Inhaler °Proper inhaler technique is very important. Good technique ensures that the medicine reaches the lungs. Poor technique results in depositing the medicine on the tongue and back of the throat rather than in the airways. If you do not use the inhaler with good technique, the medicine will not help you. °STEPS TO FOLLOW IF USING AN INHALER WITHOUT AN EXTENSION TUBE °1. Remove the cap from the inhaler. °2. If you are using the inhaler for the first time, you will need to prime it. Shake the inhaler for 5 seconds and release four puffs into the air, away from your face. Ask your health care provider or pharmacist if you have questions about priming your inhaler. °3. Shake the inhaler for 5 seconds before each breath in (inhalation). °4. Position the inhaler so that the top of the canister faces up. °5. Put your index finger on the top of the medicine canister. Your thumb supports the bottom of the inhaler. °6. Open your mouth. °7. Either place the inhaler between your teeth and place your lips tightly around the mouthpiece, or hold the inhaler 1-2 inches away from your open mouth. If you are unsure of which technique to use, ask your health care provider. °8. Breathe out (exhale) normally and as completely as possible. °9. Press the canister down with your index finger to release the medicine. °10. At the same time as the canister is pressed, inhale deeply and slowly until your lungs are completely filled. This should take 4-6 seconds. Keep your tongue down. °11. Hold the medicine in your lungs for 5-10 seconds (10 seconds is best). This helps the medicine get into the small airways of your lungs. °12. Breathe out slowly, through pursed lips. Whistling is an example of pursed lips. °13. Wait at least 15-30 seconds between puffs. Continue with the above steps until you have taken the number of puffs your health care provider has ordered. Do not use the inhaler more than your health care provider  tells you. °14. Replace the cap on the inhaler. °15. Follow the directions from your health care provider or the inhaler insert for cleaning the inhaler. °STEPS TO FOLLOW IF USING AN INHALER WITH AN EXTENSION (SPACER) °1. Remove the cap from the inhaler. °2. If you are using the inhaler for the first time, you will need to prime it. Shake the inhaler for 5 seconds and release four puffs into the air, away from your face. Ask your health care provider or pharmacist if you have questions about priming your inhaler. °3. Shake the inhaler for 5 seconds before each breath in (inhalation). °4. Place the open end of the spacer onto the mouthpiece of the inhaler. °5. Position the inhaler so that the top of the canister faces up and the spacer mouthpiece faces you. °6. Put your index finger on the top of the medicine canister. Your thumb supports the bottom of the inhaler and the spacer. °7. Breathe out (exhale) normally and as completely as possible. °8. Immediately after exhaling, place the spacer between your teeth and into your mouth. Close your lips tightly around the spacer. °9. Press the canister down with your index finger to release the medicine. °10. At the same time as the canister is pressed, inhale deeply and slowly until your lungs are completely filled. This should take 4-6 seconds. Keep your tongue down and out of the way. °11. Hold the medicine in your lungs for 5-10 seconds (10 seconds is best). This helps the   medicine get into the small airways of your lungs. Exhale. °12. Repeat inhaling deeply through the spacer mouthpiece. Again hold that breath for up to 10 seconds (10 seconds is best). Exhale slowly. If it is difficult to take this second deep breath through the spacer, breathe normally several times through the spacer. Remove the spacer from your mouth. °13. Wait at least 15-30 seconds between puffs. Continue with the above steps until you have taken the number of puffs your health care provider has  ordered. Do not use the inhaler more than your health care provider tells you. °14. Remove the spacer from the inhaler, and place the cap on the inhaler. °15. Follow the directions from your health care provider or the inhaler insert for cleaning the inhaler and spacer. °If you are using different kinds of inhalers, use your quick relief medicine to open the airways 10-15 minutes before using a steroid if instructed to do so by your health care provider. If you are unsure which inhalers to use and the order of using them, ask your health care provider, nurse, or respiratory therapist. °If you are using a steroid inhaler, always rinse your mouth with water after your last puff, then gargle and spit out the water. Do not swallow the water. °AVOID: °· Inhaling before or after starting the spray of medicine. It takes practice to coordinate your breathing with triggering the spray. °· Inhaling through the nose (rather than the mouth) when triggering the spray. °HOW TO DETERMINE IF YOUR INHALER IS FULL OR NEARLY EMPTY °You cannot know when an inhaler is empty by shaking it. A few inhalers are now being made with dose counters. Ask your health care provider for a prescription that has a dose counter if you feel you need that extra help. If your inhaler does not have a counter, ask your health care provider to help you determine the date you need to refill your inhaler. Write the refill date on a calendar or your inhaler canister. Refill your inhaler 7-10 days before it runs out. Be sure to keep an adequate supply of medicine. This includes making sure it is not expired, and that you have a spare inhaler.  °SEEK MEDICAL CARE IF:  °· Your symptoms are only partially relieved with your inhaler. °· You are having trouble using your inhaler. °· You have some increase in phlegm. °SEEK IMMEDIATE MEDICAL CARE IF:  °· You feel little or no relief with your inhalers. You are still wheezing and are feeling shortness of breath or  tightness in your chest or both. °· You have dizziness, headaches, or a fast heart rate. °· You have chills, fever, or night sweats. °· You have a noticeable increase in phlegm production, or there is blood in the phlegm. °MAKE SURE YOU:  °· Understand these instructions. °· Will watch your condition. °· Will get help right away if you are not doing well or get worse. °  °This information is not intended to replace advice given to you by your health care provider. Make sure you discuss any questions you have with your health care provider. °  °Document Released: 11/16/2000 Document Revised: 09/09/2013 Document Reviewed: 06/18/2013 °Elsevier Interactive Patient Education ©2016 [employer] ° °

## 2016-06-12 NOTE — ED Notes (Signed)
  Reviewed d/c instructions, follow-up care, and prescriptions with pt. Pt verbalized understanding 

## 2016-06-12 NOTE — ED Notes (Signed)
States he was seen yesterday for same  States he is not any better  Had relief from cough last pm but cough returned this am

## 2016-06-12 NOTE — ED Provider Notes (Signed)
[facility] Emergency Department Provider Note  ____________________________________________  Time seen: Approximately 6:55 PM  I have reviewed the triage vital signs and the nursing notes.   HISTORY  Chief Complaint Cough   HPI Jeremiah Newman is a 25 y.o. male who presents to the emergency department for evaluation of cough. He was evaluated yesterday for the same. He has taken the steroid and tessalon pearls without relief. He states that he has coughed to the point that his throat and chest are burning and his head is throbbing. He states that he has been wheezing as well.   History reviewed. No pertinent past medical history.  There are no active problems to display for this patient.   History reviewed. No pertinent past surgical history.  Current Outpatient Rx  Name  Route  Sig  Dispense  Refill  . albuterol (PROVENTIL HFA;VENTOLIN HFA) 108 (90 Base) MCG/ACT inhaler   Inhalation   Inhale 2 puffs into the lungs every 6 (six) hours as needed for wheezing or shortness of breath.   1 Inhaler   2   . azithromycin (ZITHROMAX) 250 MG tablet      2 tablets today, then 1 tablet for the next 4 days.   6 each   0   . benzonatate (TESSALON PERLES) 100 MG capsule   Oral   Take 1 capsule (100 mg total) by mouth 3 (three) times daily as needed for cough.   30 capsule   0   . cyclobenzaprine (FLEXERIL) 10 MG tablet   Oral   Take 1 tablet (10 mg total) by mouth 2 (two) times daily as needed for muscle spasms.   10 tablet   0   . diazepam (VALIUM) 2 MG tablet   Oral   Take 1 tablet (2 mg total) by mouth every 8 (eight) hours as needed for muscle spasms.   15 tablet   0   . guaiFENesin-codeine (ROBITUSSIN AC) 100-10 MG/5ML syrup   Oral   Take 5 mLs by mouth 3 (three) times daily as needed for cough.   120 mL   0   . ibuprofen (ADVIL,MOTRIN) 800 MG tablet   Oral   Take 1 tablet (800 mg total) by mouth every 8 (eight) hours as needed for  moderate pain.   15 tablet   0   . methylPREDNISolone (MEDROL DOSEPAK) 4 MG TBPK tablet      Take Tapered dose as directed   21 tablet   0   . naproxen (NAPROSYN) 500 MG tablet   Oral   Take 1 tablet (500 mg total) by mouth 2 (two) times daily.   10 tablet   0   . naproxen (NAPROSYN) 500 MG tablet   Oral   Take 1 tablet (500 mg total) by mouth 2 (two) times daily with a meal.   20 tablet   0   . ondansetron (ZOFRAN ODT) 4 MG disintegrating tablet       ODT q4 hours prn nausea/vomit   20 tablet   0   . oxyCODONE-acetaminophen (ROXICET) 5-325 MG tablet   Oral   Take 1 tablet by mouth every 4 (four) hours as needed for severe pain.   15 tablet   0     Allergies Review of patient's allergies indicates no known allergies.  No family history on file.  Social History Social History  Substance Use Topics  . Smoking status: Current Some Day Smoker -- 1.00 packs/day    Types:  Cigarettes  . Smokeless tobacco: None  . Alcohol Use: Yes    Review of Systems Constitutional: Negative fever/chills ENT: Positive for sore throat. Cardiovascular: Denies chest pain. Respiratory: Negative for shortness of breath. Positive for cough. Gastrointestinal: Negative for nausea,  no vomiting.  Negative for diarrhea.  Musculoskeletal: Positive for body aches Skin: Negative for rash. Neurological: Positive for headaches ____________________________________________   PHYSICAL EXAM:  VITAL SIGNS: ED Triage Vitals  Enc Vitals Group     BP 06/12/16 1749 161/68 mmHg     Pulse Rate 06/12/16 1749 114     Resp --      Temp 06/12/16 1749 98.5 F (36.9 C)     Temp Source 06/12/16 1749 Oral     SpO2 06/12/16 1749 95 %     Weight 06/12/16 1749 165 lb (74.844 kg)     Height 06/12/16 1749 5\' 7"  (1.702 m)     Head Cir --      Peak Flow --      Pain Score 06/12/16 1751 10     Pain Loc --      Pain Edu? --      Excl. in GC? --     Constitutional: Alert and oriented. Acutely ill  appearing and in no acute distress. Eyes: Conjunctivae are normal. EOMI. Ears: Normal TM Nose: No congestion; no rhinnorhea. Mouth/Throat: Mucous membranes are moist.  Oropharynx erythematous. Tonsils appear normal. Neck: No stridor.  Lymphatic: No cervical lymphadenopathy. Cardiovascular: Normal rate, regular rhythm. Grossly normal heart sounds.  Good peripheral circulation. Respiratory: Normal respiratory effort.  No retractions. Wheezing throughout with rhonchi in the bases. Gastrointestinal: Soft and nontender.  Musculoskeletal: FROM x 4 extremities.  Neurologic:  Normal speech and language.  Skin:  Skin is warm, dry and intact. No rash noted. Psychiatric: Mood and affect are normal. Speech and behavior are normal.  ____________________________________________   LABS (all labs ordered are listed, but only abnormal results are displayed)  Labs Reviewed - No data to display ____________________________________________  EKG   ____________________________________________  RADIOLOGY  Not indicated. ____________________________________________   PROCEDURES  Procedure(s) performed: None  Critical Care performed: No  ____________________________________________   INITIAL IMPRESSION / ASSESSMENT AND PLAN / ED COURSE  Pertinent labs & imaging results that were available during my care of the patient were reviewed by me and considered in my medical decision making (see chart for details).   Duoneb given. Will reassess. Persistent cough observed.  ----------------------------------------- 7:35 PM on 06/12/2016 -----------------------------------------  Improved breath sounds with much improvement in the airflow. Albuterol treatment ordered. Will reassess. Plan will be to prescribe azithromycin for potential atypicals and give Robitussin AC for cough.  8:20 PM  Patient to be discharged home with azithromycin, albuterol inhaler, and Robitussin-AC. He was encouraged to  follow up with the primary care provider for choice for symptoms that are not improving over the next 2-3 days. He was encouraged to return to the emergency department for symptoms that change or worsen if he is unable schedule an appointment. ____________________________________________   FINAL CLINICAL IMPRESSION(S) / ED DIAGNOSES  Final diagnoses:  Bronchitis       Chinita PesterCari B Donyelle Enyeart, FNP 06/12/16 2059  Minna AntisKevin Paduchowski, MD 06/12/16 2332

## 2016-06-12 NOTE — ED Notes (Signed)
Pt presents with cough, was seen yesterday for same but not any better.

## 2017-03-18 ENCOUNTER — Emergency Department: Payer: BLUE CROSS/BLUE SHIELD

## 2017-03-18 ENCOUNTER — Encounter: Payer: Self-pay | Admitting: Emergency Medicine

## 2017-03-18 ENCOUNTER — Emergency Department
Admission: EM | Admit: 2017-03-18 | Discharge: 2017-03-18 | Disposition: A | Discharge status: Home / Self Care | Payer: BLUE CROSS/BLUE SHIELD | Attending: Emergency Medicine | Admitting: Emergency Medicine

## 2017-03-18 DIAGNOSIS — Y929 Unspecified place or not applicable: Secondary | ICD-10-CM | POA: Insufficient documentation

## 2017-03-18 DIAGNOSIS — Y939 Activity, unspecified: Secondary | ICD-10-CM | POA: Insufficient documentation

## 2017-03-18 DIAGNOSIS — S61412A Laceration without foreign body of left hand, initial encounter: Secondary | ICD-10-CM | POA: Diagnosis not present

## 2017-03-18 DIAGNOSIS — F17210 Nicotine dependence, cigarettes, uncomplicated: Secondary | ICD-10-CM | POA: Diagnosis not present

## 2017-03-18 DIAGNOSIS — M79642 Pain in left hand: Secondary | ICD-10-CM

## 2017-03-18 DIAGNOSIS — W228XXA Striking against or struck by other objects, initial encounter: Secondary | ICD-10-CM | POA: Diagnosis not present

## 2017-03-18 DIAGNOSIS — S6992XA Unspecified injury of left wrist, hand and finger(s), initial encounter: Secondary | ICD-10-CM | POA: Diagnosis present

## 2017-03-18 DIAGNOSIS — Y999 Unspecified external cause status: Secondary | ICD-10-CM | POA: Insufficient documentation

## 2017-03-18 DIAGNOSIS — F1010 Alcohol abuse, uncomplicated: Secondary | ICD-10-CM

## 2017-03-18 DIAGNOSIS — S60222A Contusion of left hand, initial encounter: Secondary | ICD-10-CM

## 2017-03-18 DIAGNOSIS — F1994 Other psychoactive substance use, unspecified with psychoactive substance-induced mood disorder: Secondary | ICD-10-CM

## 2017-03-18 LAB — COMPREHENSIVE METABOLIC PANEL
ALT: 22 U/L (ref 17–63)
ANION GAP: 9 (ref 5–15)
AST: 29 U/L (ref 15–41)
Albumin: 4.4 g/dL (ref 3.5–5.0)
Alkaline Phosphatase: 57 U/L (ref 38–126)
BILIRUBIN TOTAL: 0.7 mg/dL (ref 0.3–1.2)
BUN: 7 mg/dL (ref 6–20)
CO2: 22 mmol/L (ref 22–32)
CREATININE: 0.73 mg/dL (ref 0.61–1.24)
Calcium: 8.5 mg/dL — ABNORMAL LOW (ref 8.9–10.3)
Chloride: 110 mmol/L (ref 101–111)
GFR calc Af Amer: >60 mL/min (ref >60)
Glucose, Bld: 88 mg/dL (ref 65–99)
POTASSIUM: 3.7 mmol/L (ref 3.5–5.1)
Sodium: 141 mmol/L (ref 135–145)
Total Protein: 6.9 g/dL (ref 6.5–8.1)

## 2017-03-18 LAB — CBC
HCT: 46.6 % (ref 40.0–52.0)
Hemoglobin: 16 g/dL (ref 13.0–18.0)
MCH: 29.9 pg (ref 26.0–34.0)
MCHC: 34.4 g/dL (ref 32.0–36.0)
MCV: 87.1 fL (ref 80.0–100.0)
PLATELETS: 249 10*3/uL (ref 150–440)
RBC: 5.35 MIL/uL (ref 4.40–5.90)
RDW: 13 % (ref 11.5–14.5)
WBC: 15.9 10*3/uL — ABNORMAL HIGH (ref 3.8–10.6)

## 2017-03-18 LAB — ACETAMINOPHEN LEVEL: Acetaminophen (Tylenol), Serum: <10 ug/mL — ABNORMAL LOW (ref 10–30)

## 2017-03-18 LAB — SALICYLATE LEVEL

## 2017-03-18 LAB — ETHANOL: ALCOHOL ETHYL (B): 228 mg/dL — AB (ref <5)

## 2017-03-18 MED ORDER — BACITRACIN ZINC 500 UNIT/GM EX OINT
TOPICAL_OINTMENT | CUTANEOUS | Status: AC
  Administered 2017-03-18: 1
  Filled 2017-03-18: qty 0.9

## 2017-03-18 MED ORDER — LIDOCAINE HCL (PF) 1 % IJ SOLN
INTRAMUSCULAR | Status: AC
  Administered 2017-03-18: 5 mL via INTRADERMAL
  Filled 2017-03-18: qty 5

## 2017-03-18 MED ORDER — IBUPROFEN 800 MG PO TABS
800.0000 mg | ORAL_TABLET | Freq: Once | ORAL | Status: AC
Start: 2017-03-18 — End: 2017-03-18
  Administered 2017-03-18: 800 mg via ORAL

## 2017-03-18 MED ORDER — LIDOCAINE HCL (PF) 1 % IJ SOLN
5.0000 mL | Freq: Once | INTRAMUSCULAR | Status: AC
  Administered 2017-03-18: 5 mL via INTRADERMAL

## 2017-03-18 MED ORDER — IBUPROFEN 800 MG PO TABS
ORAL_TABLET | ORAL | Status: AC
  Administered 2017-03-18: 800 mg via ORAL
  Filled 2017-03-18: qty 1

## 2017-03-18 NOTE — ED Notes (Signed)
Gave pt a blanket
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
## 2017-03-18 NOTE — Discharge Instructions (Signed)
Keep the stitches and the rest of the wounds clean and dry. Change dressing every day. You can clean the skin with soap and water pat it dry. Do not rub. Reapply Neosporin to the open abrasions. Return for any signs of infection increased pain swelling or redness or pus. Your hand should feel better soon, you can take Motrin or Tylenol first 2 or 3 days if needed. If you're not any better return in about a week for re-x-ray. Follow-up with your doctor or urgent care or return in 2 days for recheck and get the stitches out in 10-14 days. Be careful with the alcohol. You can do silly only things if you're drunk.

## 2017-04-22 ENCOUNTER — Encounter: Payer: Self-pay | Admitting: Emergency Medicine

## 2017-04-22 ENCOUNTER — Emergency Department: Payer: BLUE CROSS/BLUE SHIELD

## 2017-04-22 ENCOUNTER — Emergency Department
Admission: EM | Admit: 2017-04-22 | Discharge: 2017-04-23 | Disposition: A | Discharge status: Home / Self Care | Payer: BLUE CROSS/BLUE SHIELD | Attending: Emergency Medicine | Admitting: Emergency Medicine

## 2017-04-22 DIAGNOSIS — Z79899 Other long term (current) drug therapy: Secondary | ICD-10-CM | POA: Diagnosis not present

## 2017-04-22 DIAGNOSIS — S0101XA Laceration without foreign body of scalp, initial encounter: Secondary | ICD-10-CM | POA: Insufficient documentation

## 2017-04-22 DIAGNOSIS — R0981 Nasal congestion: Secondary | ICD-10-CM | POA: Insufficient documentation

## 2017-04-22 DIAGNOSIS — Y999 Unspecified external cause status: Secondary | ICD-10-CM | POA: Insufficient documentation

## 2017-04-22 DIAGNOSIS — Z23 Encounter for immunization: Secondary | ICD-10-CM | POA: Diagnosis not present

## 2017-04-22 DIAGNOSIS — X838XXA Intentional self-harm by other specified means, initial encounter: Secondary | ICD-10-CM | POA: Insufficient documentation

## 2017-04-22 DIAGNOSIS — F1994 Other psychoactive substance use, unspecified with psychoactive substance-induced mood disorder: Secondary | ICD-10-CM | POA: Diagnosis not present

## 2017-04-22 DIAGNOSIS — F17210 Nicotine dependence, cigarettes, uncomplicated: Secondary | ICD-10-CM | POA: Diagnosis not present

## 2017-04-22 DIAGNOSIS — S0990XA Unspecified injury of head, initial encounter: Secondary | ICD-10-CM | POA: Diagnosis present

## 2017-04-22 DIAGNOSIS — Z7289 Other problems related to lifestyle: Secondary | ICD-10-CM

## 2017-04-22 DIAGNOSIS — Y939 Activity, unspecified: Secondary | ICD-10-CM | POA: Insufficient documentation

## 2017-04-22 DIAGNOSIS — R55 Syncope and collapse: Secondary | ICD-10-CM

## 2017-04-22 DIAGNOSIS — F1010 Alcohol abuse, uncomplicated: Secondary | ICD-10-CM | POA: Diagnosis present

## 2017-04-22 DIAGNOSIS — Y929 Unspecified place or not applicable: Secondary | ICD-10-CM | POA: Insufficient documentation

## 2017-04-22 DIAGNOSIS — F1410 Cocaine abuse, uncomplicated: Secondary | ICD-10-CM

## 2017-04-22 LAB — COMPREHENSIVE METABOLIC PANEL
ALBUMIN: 3.9 g/dL (ref 3.5–5.0)
ALT: 13 U/L — ABNORMAL LOW (ref 17–63)
ANION GAP: 6 (ref 5–15)
AST: 18 U/L (ref 15–41)
Alkaline Phosphatase: 75 U/L (ref 38–126)
BUN: 8 mg/dL (ref 6–20)
CO2: 22 mmol/L (ref 22–32)
Calcium: 8.8 mg/dL — ABNORMAL LOW (ref 8.9–10.3)
Chloride: 114 mmol/L — ABNORMAL HIGH (ref 101–111)
Creatinine, Ser: 0.98 mg/dL (ref 0.61–1.24)
GFR calc non Af Amer: >60 mL/min (ref >60)
GLUCOSE: 98 mg/dL (ref 65–99)
Potassium: 3.8 mmol/L (ref 3.5–5.1)
SODIUM: 142 mmol/L (ref 135–145)
Total Bilirubin: 0.4 mg/dL (ref 0.3–1.2)
Total Protein: 6.4 g/dL — ABNORMAL LOW (ref 6.5–8.1)

## 2017-04-22 LAB — CBC
HCT: 45.6 % (ref 40.0–52.0)
Hemoglobin: 15.8 g/dL (ref 13.0–18.0)
MCH: 30.7 pg (ref 26.0–34.0)
MCHC: 34.5 g/dL (ref 32.0–36.0)
MCV: 88.9 fL (ref 80.0–100.0)
PLATELETS: 247 10*3/uL (ref 150–440)
RBC: 5.13 MIL/uL (ref 4.40–5.90)
RDW: 13.8 % (ref 11.5–14.5)
WBC: 12.6 10*3/uL — ABNORMAL HIGH (ref 3.8–10.6)

## 2017-04-22 LAB — SALICYLATE LEVEL

## 2017-04-22 LAB — ETHANOL: Alcohol, Ethyl (B): 215 mg/dL — ABNORMAL HIGH (ref <5)

## 2017-04-22 LAB — ACETAMINOPHEN LEVEL

## 2017-04-22 MED ORDER — TETANUS-DIPHTH-ACELL PERTUSSIS 5-2.5-18.5 LF-MCG/0.5 IM SUSP
0.5000 mL | Freq: Once | INTRAMUSCULAR | Status: AC
  Administered 2017-04-23: 0.5 mL via INTRAMUSCULAR
  Filled 2017-04-22: qty 0.5

## 2017-04-22 MED ORDER — LIDOCAINE HCL (PF) 1 % IJ SOLN
INTRAMUSCULAR | Status: AC
  Filled 2017-04-22: qty 5
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
## 2017-04-22 NOTE — ED Provider Notes (Signed)
[facility] Emergency Department Provider Note  Time seen: 10:25 PM  I have reviewed the triage vital signs and the nursing notes.   HISTORY  Chief Complaint No chief complaint on file.    HPI Jeremiah Newman is a 26 y.o. male with a past medical history of alcohol use, presents the emergency department with self-mutilation and alcohol intoxication. According to police the patient was found to be intoxicated, was being arrested for public intoxication. While in the back of the police, the patient began hitting his head on the police car causing a laceration to his head. They state they got the patient out of the police car onto the ground and he began smashing his own head on the ground as well. Denied LOC. Denies vomiting. They stated the patient was then threatening police as well as his wife. Saying he wants to die, so they brought him to the emergency department for evaluation. Per police the patient does have a warning for his arrest for tonight's behavior.  No past medical history on file.  Patient Active Problem List   Diagnosis Date Noted  . Substance induced mood disorder (HCC) 03/18/2017  . Alcohol abuse 03/18/2017    No past surgical history on file.  Prior to Admission medications   Medication Sig Start Date End Date Taking? Authorizing Provider  albuterol (PROVENTIL HFA;VENTOLIN HFA) 108 (90 Base) MCG/ACT inhaler Inhale 2 puffs into the lungs every 6 (six) hours as needed for wheezing or shortness of breath. 06/12/16   Triplett, Rulon Eisenmengerari B, FNP  azithromycin (ZITHROMAX) 250 MG tablet 2 tablets today, then 1 tablet for the next 4 days. 06/12/16   Triplett, Cari B, FNP  benzonatate (TESSALON PERLES) 100 MG capsule Take 1 capsule (100 mg total) by mouth 3 (three) times daily as needed for cough. 06/11/16 06/11/17  Joni ReiningSmith, Ronald K, PA-C  cyclobenzaprine (FLEXERIL) 10 MG tablet Take 1 tablet (10 mg total) by mouth 2 (two) times daily as needed for muscle  spasms. 03/07/15   Purvis SheffieldHarrison, Forrest, MD  diazepam (VALIUM) 2 MG tablet Take 1 tablet (2 mg total) by mouth every 8 (eight) hours as needed for muscle spasms. 12/08/15   Irean HongSung, Jade J, MD  guaiFENesin-codeine [facility](ROBITUSSIN AC) 100-10 MG/5ML syrup Take 5 mLs by mouth 3 (three) times daily as needed for cough. 06/12/16   Triplett, Cari B, FNP  ibuprofen (ADVIL,MOTRIN) 800 MG tablet Take 1 tablet (800 mg total) by mouth every 8 (eight) hours as needed for moderate pain. 12/08/15   Irean HongSung, Jade J, MD  methylPREDNISolone (MEDROL DOSEPAK) 4 MG TBPK tablet Take Tapered dose as directed 06/11/16   Joni ReiningSmith, Ronald K, PA-C  naproxen (NAPROSYN) 500 MG tablet Take 1 tablet (500 mg total) by mouth 2 (two) times daily. 03/05/15   Blane OharaZavitz, Joshua, MD  naproxen (NAPROSYN) 500 MG tablet Take 1 tablet (500 mg total) by mouth 2 (two) times daily with a meal. 09/21/15   Joni ReiningSmith, Ronald K, PA-C  ondansetron (ZOFRAN ODT) 4 MG disintegrating tablet 4mg  ODT q4 hours prn nausea/vomit 03/07/15   Purvis SheffieldHarrison, Forrest, MD  oxyCODONE-acetaminophen (ROXICET) 5-325 MG tablet Take 1 tablet by mouth every 4 (four) hours as needed for severe pain. 12/08/15   Irean HongSung, Jade J, MD    No Known Allergies  No family history on file.  Social History Social History  Substance Use Topics  . Smoking status: Current Some Day Smoker    Packs/day: 1.00    Types: Cigarettes  . Smokeless tobacco: Never  Used  . Alcohol use Yes    Review of Systems Constitutional: Negative for fever. Eyes: Negative for visual changes. ENT: Positive for nasal congestion Cardiovascular: Negative for chest pain. Respiratory: Negative for shortness of breath. Gastrointestinal: Negative for abdominal pain Musculoskeletal: Negative for neck pain or back pain Skin: Laceration to the head Neurological: Moderate headache. Denies weakness or numbness. All other ROS negative  ____________________________________________   PHYSICAL EXAM:  VITAL SIGNS: ED Triage Vitals [04/22/17  2215]  Enc Vitals Group     BP (!) 120/53     Pulse Rate 100     Resp 18     Temp 98.9 F (37.2 C)     Temp Source Oral     SpO2 97 %     Weight      Height      Head Circumference      Peak Flow      Pain Score      Pain Loc      Pain Edu?      Excl. in GC?     Constitutional: Alert and oriented. Well appearing and in no distress. Eyes: Normal exam ENT   Head: 3 cm laceration to frontal scalp within the hairline, hemostatic, small laceration above right eye. Dried blood over face.   Mouth/Throat: Mucous membranes are moist.No oral trauma. The patient does have dried blood in bilateral nostrils but no septal hematoma. Cardiovascular: Normal rate, regular rhythm. No murmur Respiratory: Normal respiratory effort without tachypnea nor retractions. Breath sounds are clear  Gastrointestinal: Soft and nontender. No distention.  Musculoskeletal: Nontender with normal range of motion in all extremities.  Neurologic:  Normal speech and language. No gross focal neurologic deficits  Skin:  Skin is warm, dry. 3 similar laceration to frontal scalp within the hairline, 0.5 cm laceration above right eye. Psychiatric: Suicidal ideation, self inhalation  ____________________________________________     RADIOLOGY  CT head negative  ____________________________________________   INITIAL IMPRESSION / ASSESSMENT AND PLAN / ED COURSE  Pertinent labs & imaging results that were available during my care of the patient were reviewed by me and considered in my medical decision making (see chart for details).  Patient presents to the emergency department with self-mutilation and suicidal ideation. Patient continues to admit suicidal ideation in the emergency department. We will place the patient under an involuntary commitment. The patient does have a 3 cm laceration to the frontal scalp within the hairline, we will repair with staples. The patient has a very small 0.5 cm laceration just  above the right eyebrow that is non-gaping, non-hemostatic we will Dermabond. Given the patient's head injury we'll obtain a CT scan and rule out intracranial abnormality. We'll place the patient under IVC, have the patient seen by psychiatry and TTS. Patient agreeable to plan. Currently calm and cooperative.   LACERATION REPAIR Performed by: Minna Antis Authorized by: Minna Antis Consent: Verbal consent obtained. Risks and benefits: risks, benefits and alternatives were discussed Consent given by: patient Patient identity confirmed: provided demographic data Prepped and Draped in normal sterile fashion Wound explored  Laceration Location: frontal scalp  Laceration Length: 3cm  No Foreign Bodies seen or palpated  Anesthesia: local infiltration  Local anesthetic: lidocaine 1 % w/o epinephrine  Anesthetic total: 4 ml  Irrigation method: syringe Amount of cleaning: standard  Skin closure: staples  Number of staples: 3   Technique: staples  Patient tolerance: Patient tolerated the procedure well with no immediate complications.   ____________________________________________   FINAL  CLINICAL IMPRESSION(S) / ED DIAGNOSES  Head injury Laceration Self mutilation Suicidal ideation    Minna Antis, MD 04/22/17 2320

## 2017-04-23 DIAGNOSIS — F1994 Other psychoactive substance use, unspecified with psychoactive substance-induced mood disorder: Secondary | ICD-10-CM

## 2017-04-23 DIAGNOSIS — R55 Syncope and collapse: Secondary | ICD-10-CM

## 2017-04-23 DIAGNOSIS — F1410 Cocaine abuse, uncomplicated: Secondary | ICD-10-CM

## 2017-04-23 LAB — URINE DRUG SCREEN, QUALITATIVE (ARMC ONLY)
AMPHETAMINES, UR SCREEN: NOT DETECTED
BARBITURATES, UR SCREEN: NOT DETECTED
BENZODIAZEPINE, UR SCRN: NOT DETECTED
Cannabinoid 50 Ng, Ur ~~LOC~~: NOT DETECTED
Cocaine Metabolite,Ur ~~LOC~~: POSITIVE — AB
MDMA (Ecstasy)Ur Screen: NOT DETECTED
Methadone Scn, Ur: NOT DETECTED
Opiate, Ur Screen: NOT DETECTED
PHENCYCLIDINE (PCP) UR S: NOT DETECTED
Tricyclic, Ur Screen: NOT DETECTED

## 2017-04-23 NOTE — ED Notes (Signed)
Discussed discharge instructions, prescriptions, and follow-up care with patient. No questions or concerns at this time. Pt stable at discharge.  

## 2017-04-23 NOTE — ED Triage Notes (Signed)
[place] [facility] [place] [facility] Per EMS pt was combative at scene, banging head against police car window. When patient was removed from vehicle, patient persisted on hitting head against asphalt in the parking lot. Upon EMS arrival pt was alert and oriented, pupil WNL, and patient calm upon their arrival. (+) ETOH and (+) SI.  Patient arrives with head dressing and covered in dried blood. Laceration noted to middle top of head, bleeding controlled. Patient alert and oriented, (+) ETOH Pt states drank 3-4 (25 oz) beers today, denies drug use. Reports depression, states SI with plan. When asked what his plan is, pt states "the quickest way-cutting my wrist or slicing my throat." Pt alert and oriented, respirations even and unlabored.

## 2017-04-23 NOTE — Consult Note (Signed)
[place] Psychiatry Consult   Reason for Consult:  Consult for 26 year old man brought in by police last night after pounding his head on the police car while intoxicated Referring Physician:  R[place] Patient Identification: Jeremiah Newman MRN:  [id number] Principal Diagnosis: Substance induced mood disorder [facility]) Diagnosis:   Patient Active Problem List   Diagnosis Date Noted  . Cocaine abuse [F14.10] 04/23/2017  . Blackout [R55] 04/23/2017  . Substance induced mood disorder ([place]) [F19.94] 03/18/2017  . Alcohol abuse [F10.10] 03/18/2017    Total Time spent with patient: 1 hour  Subjective:   Jeremiah Newman is a 26 y.o. male patient admitted with "I guess they say that I hit my head or something".  HPI:  Patient interviewed. Chart reviewed. 26 year old man brought to the emergency room last night by law enforcement. They picked him up for intoxication in public and reports that he banged his head intentionally on the police car and made statements about wishing he were dead. Patient on interview today has no memory of any of this but doesn't deny the possibility of it. He remembers drinking heavily yesterday. Can't remember how much but says he's been drinking every single day and also using a lot of drugs. He is vague about exactly what he's been using. Drug screen is positive for cocaine. Patient says he knows that his substance abuse problem is getting worse and is out of control. Mood feels down and negative and nervous most of the time. Sleep patterns are erratic. He is having trouble maintaining his work and his relationships with his family. Has pretty much lost any stable place to live for now. Patient denies however having any thought of wishing to die or wishing to hurt himself now that he is sober and denies any wish to hurt anyone else. He denies that he's having any hallucinations. He is feeling jittery and admits that he is having blackouts pretty regularly. He is  not currently engaged in any kind of outpatient substance abuse treatment or any mental health treatment. He doesn't report any stresses except for those already mentioned which she takes essentially flow from his substance abuse problem.  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 He says he doesn't really have a place to live anymore because nobody can put up with him so he's probably going to stay in his car.  Medical history: He has a small laceration just above his right eyebrow from where he had been hitting his head. It's not bleeding right now and doesn't look like he needs any other treatment. Doesn't know of any ongoing medical problems.  Substance abuse history: He says he's been drinking since he was 23 and has had spells of adding drugs on intermittently since but things are just getting worse. He does not know of any seizures that he's ever had and does not appear to of ever had delirium tremens. Has really never engaged in any kind of substance abuse treatment program. He has had previous presentations to the emergency room here for similar circumstances  Past Psychiatric History: No history of suicide attempts no history of hospitalization no history of psychiatric medicine. Behavioral and mood problems seem to all come from his substance abuse issues. No evidence or history of psychosis  Risk to Self: Suicidal Ideation: Yes-Currently Present Suicidal Intent: Yes-Currently Present Is patient at risk for suicide?: No (Currently in the hospital) Suicidal Plan?: Yes-Currently Present Specify Current Suicidal  Plan: Cut his wrist or his throat Access to Means: No (Currently in the hospital) What has been your use of drugs/alcohol within the last 12 months?: Reports use of alcohol, heroine, cocaine, and molly How many times?: 1 Other Self Harm Risks: denied Triggers for Past Attempts: None known Intentional Self Injurious  Behavior: None Risk to Others: Homicidal Ideation: No Thoughts of Harm to Others: No Current Homicidal Intent: No Current Homicidal Plan: No Access to Homicidal Means: No Identified Victim: None reported History of harm to others?: No Assessment of Violence: On admission Violent Behavior Description: combative with officers in community Does patient have access to weapons?: Yes (Comment) (Knives and guns) Criminal Charges Pending?: No Does patient have a court date: No Prior Inpatient Therapy: Prior Inpatient Therapy: No Prior Therapy Dates: n/a Prior Therapy Facilty/Provider(s): n/a Reason for Treatment: n/a Prior Outpatient Therapy: Prior Outpatient Therapy: No Prior Therapy Dates: n/a Prior Therapy Facilty/Provider(s): n/a Reason for Treatment: n/a Does patient have an ACCT team?: No Does patient have Intensive In-House Services?  : No Does patient have [facility] services? : No Does patient have P4CC services?: No  Past Medical History: History reviewed. No pertinent past medical history. History reviewed. No pertinent surgical history. Family History: History reviewed. No pertinent family history. Family Psychiatric  History: He says there is a very extensive family history of substance abuse. He describes both of his parents as having been alcoholic's says that there are multiple people on both sides of his family with heavy drug and alcohol problems Social History:  History  Alcohol Use  . Yes     History  Drug Use No    Social History   Social History  . Marital status: Single    Spouse name: N/A  . Number of children: N/A  . Years of education: N/A   Social History Main Topics  . Smoking status: Current Some Day Smoker    Packs/day: 1.00    Types: Cigarettes  . Smokeless tobacco: Never Used  . Alcohol use Yes  . Drug use: No  . Sexual activity: Not Asked   Other Topics Concern  . None   Social History Narrative  . None   Additional Social History:      Allergies:  No Known Allergies  Labs:  Results for orders placed or performed during the hospital encounter of 04/22/17 (from the past 48 hour(s))  CBC     Status: Abnormal   Collection Time: 04/22/17 10:17 PM  Result Value Ref Range   WBC 12.6 (H) 3.8 - 10.6 K/uL   RBC 5.13 4.40 - 5.90 MIL/uL   Hemoglobin 15.8 13.0 - 18.0 g/dL   HCT 45.6 40.0 - 52.0 %   MCV 88.9 80.0 - 100.0 fL   MCH 30.7 26.0 - 34.0 pg   MCHC 34.5 32.0 - 36.0 g/dL   RDW 13.8 11.5 - 14.5 %   Platelets 247 150 - 440 K/uL  Comprehensive metabolic panel     Status: Abnormal   Collection Time: 04/22/17 10:17 PM  Result Value Ref Range   Sodium 142 135 - 145 mmol/L   Potassium 3.8 3.5 - 5.1 mmol/L   Chloride 114 (H) 101 - 111 mmol/L   CO2 22 22 - 32 mmol/L   Glucose, Bld 98 65 - 99 mg/dL   BUN 8 6 - 20 mg/dL   Creatinine, Ser 0.98 0.61 - 1.24 mg/dL   Calcium 8.8 (L) 8.9 - 10.3 mg/dL   Total Protein 6.4 (L) 6.5 -  8.1 g/dL   Albumin 3.9 3.5 - 5.0 g/dL   AST 18 15 - 41 U/L   ALT 13 (L) 17 - 63 U/L   Alkaline Phosphatase 75 38 - 126 U/L   Total Bilirubin 0.4 0.3 - 1.2 mg/dL   GFR calc non Af Amer >60 >60 mL/min   GFR calc Af Amer >60 >60 mL/min    Comment: (NOTE) The eGFR has been calculated using the CKD EPI equation. This calculation has not been validated in all clinical situations. eGFR's persistently <60 mL/min signify possible Chronic Kidney Disease.    Anion gap 6 5 - 15  Ethanol     Status: Abnormal   Collection Time: 04/22/17 10:17 PM  Result Value Ref Range   Alcohol, Ethyl (B) 215 (H) <5 mg/dL    Comment:        LOWEST DETECTABLE LIMIT FOR SERUM ALCOHOL IS 5 mg/dL FOR MEDICAL PURPOSES ONLY   Acetaminophen level     Status: Abnormal   Collection Time: 04/22/17 10:17 PM  Result Value Ref Range   Acetaminophen (Tylenol), Serum <10 (L) 10 - 30 ug/mL    Comment:        THERAPEUTIC CONCENTRATIONS VARY SIGNIFICANTLY. A RANGE OF 10-30 ug/mL MAY BE AN EFFECTIVE CONCENTRATION FOR MANY  PATIENTS. HOWEVER, SOME ARE BEST TREATED AT CONCENTRATIONS OUTSIDE THIS RANGE. ACETAMINOPHEN CONCENTRATIONS >150 ug/mL AT 4 HOURS AFTER INGESTION AND >50 ug/mL AT 12 HOURS AFTER INGESTION ARE OFTEN ASSOCIATED WITH TOXIC REACTIONS.   Salicylate level     Status: None   Collection Time: 04/22/17 10:17 PM  Result Value Ref Range   Salicylate Lvl <3.2 2.8 - 30.0 mg/dL  Urine Drug Screen, Qualitative ([facility] only)     Status: Abnormal   Collection Time: 04/22/17 10:33 PM  Result Value Ref Range   Tricyclic, Ur Screen NONE DETECTED NONE DETECTED   Amphetamines, Ur Screen NONE DETECTED NONE DETECTED   MDMA (Ecstasy)Ur Screen NONE DETECTED NONE DETECTED   Cocaine Metabolite,Ur [place] POSITIVE (A) NONE DETECTED   Opiate, Ur Screen NONE DETECTED NONE DETECTED   Phencyclidine (PCP) Ur S NONE DETECTED NONE DETECTED   Cannabinoid 50 Ng, Ur [place] NONE DETECTED NONE DETECTED   Barbiturates, Ur Screen NONE DETECTED NONE DETECTED   Benzodiazepine, Ur Scrn NONE DETECTED NONE DETECTED   Methadone Scn, Ur NONE DETECTED NONE DETECTED    Comment: (NOTE) 919  Tricyclics, urine               Cutoff 1000 ng/mL 200  Amphetamines, urine             Cutoff 1000 ng/mL 300  MDMA (Ecstasy), urine           Cutoff 500 ng/mL 400  Cocaine Metabolite, urine       Cutoff 300 ng/mL 500  Opiate, urine                   Cutoff 300 ng/mL 600  Phencyclidine (PCP), urine      Cutoff 25 ng/mL 700  Cannabinoid, urine              Cutoff 50 ng/mL 800  Barbiturates, urine             Cutoff 200 ng/mL 900  Benzodiazepine, urine           Cutoff 200 ng/mL 1000 Methadone, urine                Cutoff 300 ng/mL 1100 1200 The urine  drug screen provides only a preliminary, unconfirmed 1300 analytical test result and should not be used for non-medical 1400 purposes. Clinical consideration and professional judgment should 1500 be applied to any positive drug screen result due to possible 1600 interfering substances. A more specific  alternate chemical method 1700 must be used in order to obtain a confirmed analytical result.  1800 Gas chromato graphy / mass spectrometry (GC/MS) is the preferred 1900 confirmatory method.     No current facility-administered medications for this encounter.    Current Outpatient Prescriptions  Medication Sig Dispense Refill  . albuterol (PROVENTIL HFA;VENTOLIN HFA) 108 (90 Base) MCG/ACT inhaler Inhale 2 puffs into the lungs every 6 (six) hours as needed for wheezing or shortness of breath. 1 Inhaler 2  . azithromycin (ZITHROMAX) 250 MG tablet 2 tablets today, then 1 tablet for the next 4 days. 6 each 0  . benzonatate (TESSALON PERLES) 100 MG capsule Take 1 capsule (100 mg total) by mouth 3 (three) times daily as needed for cough. 30 capsule 0  . cyclobenzaprine (FLEXERIL) 10 MG tablet Take 1 tablet (10 mg total) by mouth 2 (two) times daily as needed for muscle spasms. 10 tablet 0  . diazepam (VALIUM) 2 MG tablet Take 1 tablet (2 mg total) by mouth every 8 (eight) hours as needed for muscle spasms. 15 tablet 0  . guaiFENesin-codeine (ROBITUSSIN AC) 100-10 MG/5ML syrup Take 5 mLs by mouth 3 (three) times daily as needed for cough. 120 mL 0  . ibuprofen (ADVIL,MOTRIN) 800 MG tablet Take 1 tablet (800 mg total) by mouth every 8 (eight) hours as needed for moderate pain. 15 tablet 0  . methylPREDNISolone (MEDROL DOSEPAK) 4 MG TBPK tablet Take Tapered dose as directed 21 tablet 0  . naproxen (NAPROSYN) 500 MG tablet Take 1 tablet (500 mg total) by mouth 2 (two) times daily. 10 tablet 0  . naproxen (NAPROSYN) 500 MG tablet Take 1 tablet (500 mg total) by mouth 2 (two) times daily with a meal. 20 tablet 0  . ondansetron (ZOFRAN ODT) 4 MG disintegrating tablet 70m ODT q4 hours prn nausea/vomit 20 tablet 0  . oxyCODONE-acetaminophen (ROXICET) 5-325 MG tablet Take 1 tablet by mouth every 4 (four) hours as needed for severe pain. 15 tablet 0    Musculoskeletal: Strength & Muscle Tone: within normal  limits Gait & Station: normal Patient leans: N/A  Psychiatric Specialty Exam: Physical Exam  Nursing note and vitals reviewed. Constitutional: He appears well-developed and well-nourished.  HENT:  Head: Normocephalic and atraumatic.  Eyes: Conjunctivae are normal. Pupils are equal, round, and reactive to light.  Neck: Normal range of motion.  Cardiovascular: Regular rhythm and normal heart sounds.   Respiratory: Effort normal.  GI: Soft.  Musculoskeletal: Normal range of motion.  Neurological: He is alert.  Skin: Skin is warm and dry.     Psychiatric: His affect is blunt. His speech is delayed. He is slowed. Thought content is not paranoid. Cognition and memory are impaired. He expresses impulsivity. He expresses no homicidal and no suicidal ideation. He exhibits abnormal recent memory.    Review of Systems  Constitutional: Negative.   HENT: Negative.   Eyes: Negative.   Respiratory: Negative.   Cardiovascular: Negative.   Gastrointestinal: Negative.   Musculoskeletal: Negative.   Skin: Negative.   Neurological: Positive for headaches.  Psychiatric/Behavioral: Positive for depression, memory loss and substance abuse. Negative for hallucinations and suicidal ideas. The patient is nervous/anxious and has insomnia.     Blood pressure (Marland Kitchen  103/51, pulse 90, temperature 98.6 F (37 C), temperature source Oral, resp. rate 16, height _0  (1.702 m), weight 81.6 kg (180 lb), SpO2 96 %.Body mass index is 28.19 kg/m.  General Appearance: Disheveled  Eye Contact:  Fair  Speech:  Slow  Volume:  Decreased  Mood:  Dysphoric  Affect:  Constricted  Thought Process:  Goal Directed  Orientation:  Full (Time, Place, and Person)  Thought Content:  Logical  Suicidal Thoughts:  No  Homicidal Thoughts:  No  Memory:  Immediate;   Fair Recent;   Fair Remote;   Poor  Judgement:  Impaired  Insight:  Shallow  Psychomotor Activity:  Decreased  Concentration:  Concentration: Fair  Recall:   [employer] of Knowledge:  Fair  Language:  Fair  Akathisia:  No  Handed:  Right  AIMS (if indicated):     Assets:  Desire for Improvement Physical Health  ADL's:  Intact  Cognition:  WNL  Sleep:        Treatment Plan Summary: Plan 26 year old man who presented last night intoxicated with a blood alcohol level over 200 and a drug screen positive for cocaine. He appears to of had a blackout last night and engaged in some self-destructive behavior. On interview today he is not psychotic not agitated and not delirious. Denies any suicidal ideation. Patient was educated about how his behavior and substance abuse issues are getting more and more life threatening. He was educated about the availability of substance abuse treatment in the community. I offered him the option of going to residential treatment services for detox but he declines that. At this point I don't think he meets commitment and would not benefit from hospitalization on our inpatient unit. His problems appear to be primarily his substance abuse issues. Tried to offer support to him and informed him that [place] is available all the time and has very good substance abuse counselors and also encouraged him to look into just starting to go to 12 step meetings. Case reviewed with emergency room physician. Case reviewed with TTS. Discontinue IVC and he can be discharged from the emergency room at the discretion of the ER doctor.  Disposition: Patient does not meet criteria for psychiatric inpatient admission. Supportive therapy provided about ongoing stressors.  Alethia Berthold, MD 04/23/2017 12:51 PM

## 2017-04-23 NOTE — ED Provider Notes (Signed)
-----------------------------------------   6:41 AM on 04/23/2017 -----------------------------------------   Blood pressure (!) 103/51, pulse 90, temperature 98.6 F (37 C), temperature source Oral, resp. rate 16, height 5\' 7"  (1.702 m), weight 81.6 kg (180 lb), SpO2 96 %.  The patient had no acute events since last update.  Calm and cooperative at this time.  Disposition is pending Psychiatry/Behavioral Medicine team recommendations.     Irean HongSung, Jade J, MD 04/23/17 [phone]

## 2017-04-23 NOTE — BH Assessment (Signed)
Per Dr.Clapacs pt's IVC has been rescinded. Pt will be d/c and does not want community resources.

## 2017-04-23 NOTE — ED Notes (Signed)
PT up to shower

## 2017-04-23 NOTE — ED Notes (Signed)
[facility]   RESCINDED  PER  DR  CLAPACS  INFORMED  RN  [name]

## 2017-04-23 NOTE — ED Provider Notes (Signed)
Dr. Toni Amendlapacs has rescinded the patient's involuntary commitment. He does not want help with detox. He is discharged home in stable condition.   Merrily Brittleifenbark, Horatio Bertz, MD 04/23/17 [phone]
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
## 2023-10-08 ENCOUNTER — Other Ambulatory Visit: Payer: Self-pay

## 2023-10-08 ENCOUNTER — Emergency Department
Admission: EM | Admit: 2023-10-08 | Discharge: 2023-10-09 | Disposition: A | Discharge status: Other Acute Inpatient Hospital | Payer: BLUE CROSS/BLUE SHIELD | Attending: Emergency Medicine | Admitting: Emergency Medicine

## 2023-10-08 DIAGNOSIS — F4310 Post-traumatic stress disorder, unspecified: Secondary | ICD-10-CM | POA: Insufficient documentation

## 2023-10-08 DIAGNOSIS — R45851 Suicidal ideations: Secondary | ICD-10-CM

## 2023-10-08 DIAGNOSIS — G4700 Insomnia, unspecified: Secondary | ICD-10-CM | POA: Insufficient documentation

## 2023-10-08 DIAGNOSIS — F209 Schizophrenia, unspecified: Secondary | ICD-10-CM | POA: Insufficient documentation

## 2023-10-08 DIAGNOSIS — Z79899 Other long term (current) drug therapy: Secondary | ICD-10-CM | POA: Insufficient documentation

## 2023-10-08 DIAGNOSIS — F332 Major depressive disorder, recurrent severe without psychotic features: Secondary | ICD-10-CM | POA: Insufficient documentation

## 2023-10-08 NOTE — ED Notes (Signed)
Pt dressed out in triage room 1 with this Clinical research associate and EDT French Ana.  Pt dressed out into blue colored BH scrubs.  Pt belongings placed into pt belongings bag and labeled appropriately w/pt labels.     Pt belongings: Black t-shirt  [employer]  Wallace Cullens boxer Therapist, music  Black cell phone  Black tennis shoes  Musician

## 2023-10-08 NOTE — ED Triage Notes (Signed)
Pt presents to ER with c/o needing help with mental health issues.  Pt reports he just got out of prison 2-3 weeks ago, and has been having issues with hearing voices, not sleeping as well, and has not been taking his meds for a while as he states they don't work for him.  Pt reports he is back home with family and it has been a very toxic environment for him, and his stress has been getting worse.  Pt states he has been having SI as well.  No specific plan in mind.  Pt otherwise calm and cooperative in triage and is A&O x4 and in NAD at this time.

## 2023-10-09 ENCOUNTER — Encounter: Payer: Self-pay | Admitting: Psychiatric/Mental Health

## 2023-10-09 ENCOUNTER — Inpatient Hospital Stay
Admission: AD | Admit: 2023-10-09 | Discharge: 2023-10-23 | DRG: 885 | Disposition: A | Discharge status: Home / Self Care | Payer: 59 | Source: Intra-hospital | Attending: Psychiatry | Admitting: Psychiatry

## 2023-10-09 DIAGNOSIS — F4310 Post-traumatic stress disorder, unspecified: Secondary | ICD-10-CM | POA: Diagnosis present

## 2023-10-09 DIAGNOSIS — Z818 Family history of other mental and behavioral disorders: Secondary | ICD-10-CM | POA: Diagnosis not present

## 2023-10-09 DIAGNOSIS — Z653 Problems related to other legal circumstances: Secondary | ICD-10-CM | POA: Diagnosis not present

## 2023-10-09 DIAGNOSIS — F410 Panic disorder [episodic paroxysmal anxiety] without agoraphobia: Secondary | ICD-10-CM | POA: Diagnosis present

## 2023-10-09 DIAGNOSIS — R45851 Suicidal ideations: Secondary | ICD-10-CM | POA: Diagnosis present

## 2023-10-09 DIAGNOSIS — F1010 Alcohol abuse, uncomplicated: Secondary | ICD-10-CM | POA: Diagnosis present

## 2023-10-09 DIAGNOSIS — F411 Generalized anxiety disorder: Secondary | ICD-10-CM | POA: Diagnosis present

## 2023-10-09 DIAGNOSIS — F333 Major depressive disorder, recurrent, severe with psychotic symptoms: Secondary | ICD-10-CM | POA: Diagnosis not present

## 2023-10-09 DIAGNOSIS — Z59819 Housing instability, housed unspecified: Secondary | ICD-10-CM | POA: Diagnosis not present

## 2023-10-09 DIAGNOSIS — F332 Major depressive disorder, recurrent severe without psychotic features: Secondary | ICD-10-CM | POA: Diagnosis present

## 2023-10-09 DIAGNOSIS — Z79899 Other long term (current) drug therapy: Secondary | ICD-10-CM

## 2023-10-09 DIAGNOSIS — Z814 Family history of other substance abuse and dependence: Secondary | ICD-10-CM | POA: Diagnosis not present

## 2023-10-09 DIAGNOSIS — G4700 Insomnia, unspecified: Secondary | ICD-10-CM | POA: Diagnosis present

## 2023-10-09 DIAGNOSIS — F17210 Nicotine dependence, cigarettes, uncomplicated: Secondary | ICD-10-CM | POA: Diagnosis present

## 2023-10-09 DIAGNOSIS — F209 Schizophrenia, unspecified: Secondary | ICD-10-CM | POA: Insufficient documentation

## 2023-10-09 DIAGNOSIS — F203 Undifferentiated schizophrenia: Secondary | ICD-10-CM

## 2023-10-09 LAB — COMPREHENSIVE METABOLIC PANEL
ALT: 17 U/L (ref 0–44)
AST: 18 U/L (ref 15–41)
Albumin: 4.5 g/dL (ref 3.5–5.0)
Alkaline Phosphatase: 83 U/L (ref 38–126)
Anion gap: 8 (ref 5–15)
BUN: 13 mg/dL (ref 6–20)
CO2: 25 mmol/L (ref 22–32)
Calcium: 9.4 mg/dL (ref 8.9–10.3)
Chloride: 107 mmol/L (ref 98–111)
Creatinine, Ser: 1.03 mg/dL (ref 0.61–1.24)
GFR, Estimated: >60 mL/min (ref >60)
Glucose, Bld: 111 mg/dL — ABNORMAL HIGH (ref 70–99)
Potassium: 3.9 mmol/L (ref 3.5–5.1)
Sodium: 140 mmol/L (ref 135–145)
Total Bilirubin: 0.8 mg/dL (ref <1.2)
Total Protein: 7.8 g/dL (ref 6.5–8.1)

## 2023-10-09 LAB — CBC
HCT: 45.7 % (ref 39.0–52.0)
Hemoglobin: 15.6 g/dL (ref 13.0–17.0)
MCH: 30.2 pg (ref 26.0–34.0)
MCHC: 34.1 g/dL (ref 30.0–36.0)
MCV: 88.4 fL (ref 80.0–100.0)
Platelets: 285 10*3/uL (ref 150–400)
RBC: 5.17 MIL/uL (ref 4.22–5.81)
RDW: 13.3 % (ref 11.5–15.5)
WBC: 11.1 10*3/uL — ABNORMAL HIGH (ref 4.0–10.5)
nRBC: 0 % (ref 0.0–0.2)

## 2023-10-09 LAB — URINE DRUG SCREEN, QUALITATIVE (ARMC ONLY)
Amphetamines, Ur Screen: NOT DETECTED
Barbiturates, Ur Screen: NOT DETECTED
Benzodiazepine, Ur Scrn: NOT DETECTED
Cannabinoid 50 Ng, Ur ~~LOC~~: POSITIVE — AB
Cocaine Metabolite,Ur ~~LOC~~: NOT DETECTED
MDMA (Ecstasy)Ur Screen: NOT DETECTED
Methadone Scn, Ur: NOT DETECTED
Opiate, Ur Screen: NOT DETECTED
Phencyclidine (PCP) Ur S: NOT DETECTED
Tricyclic, Ur Screen: NOT DETECTED

## 2023-10-09 LAB — ACETAMINOPHEN LEVEL: Acetaminophen (Tylenol), Serum: <10 ug/mL — ABNORMAL LOW (ref 10–30)

## 2023-10-09 LAB — SALICYLATE LEVEL: Salicylate Lvl: <7 mg/dL — ABNORMAL LOW (ref 7.0–30.0)

## 2023-10-09 LAB — ETHANOL: Alcohol, Ethyl (B): <10 mg/dL (ref <10)

## 2023-10-09 MED ORDER — ESCITALOPRAM OXALATE 10 MG PO TABS
10.0000 mg | ORAL_TABLET | Freq: Every day | ORAL | Status: DC
  Administered 2023-10-09 – 2023-10-13 (×5): 10 mg via ORAL
  Filled 2023-10-09 (×4): qty 1

## 2023-10-09 MED ORDER — MAGNESIUM HYDROXIDE 400 MG/5ML PO SUSP
30.0000 mL | Freq: Every day | ORAL | Status: DC | PRN
  Administered 2023-10-15: 30 mL via ORAL
  Filled 2023-10-09: qty 30

## 2023-10-09 MED ORDER — DIPHENHYDRAMINE HCL 50 MG/ML IJ SOLN: 50.0000 mg | Freq: Three times a day (TID) | INTRAMUSCULAR | Status: DC | PRN

## 2023-10-09 MED ORDER — LORAZEPAM 2 MG PO TABS
2.0000 mg | ORAL_TABLET | Freq: Three times a day (TID) | ORAL | Status: DC | PRN
Start: 2023-10-09 — End: 2023-10-22

## 2023-10-09 MED ORDER — HALOPERIDOL 5 MG PO TABS: 5.0000 mg | ORAL_TABLET | Freq: Three times a day (TID) | ORAL | Status: DC | PRN

## 2023-10-09 MED ORDER — QUETIAPINE FUMARATE 25 MG PO TABS
50.0000 mg | ORAL_TABLET | Freq: Every day | ORAL | Status: DC
  Administered 2023-10-09: 50 mg via ORAL
  Filled 2023-10-09: qty 2

## 2023-10-09 MED ORDER — ACETAMINOPHEN 325 MG PO TABS
650.0000 mg | ORAL_TABLET | Freq: Four times a day (QID) | ORAL | Status: DC | PRN
  Administered 2023-10-15 – 2023-10-16 (×2): 650 mg via ORAL
  Filled 2023-10-09 (×2): qty 2

## 2023-10-09 MED ORDER — HYDROXYZINE HCL 25 MG PO TABS
25.0000 mg | ORAL_TABLET | Freq: Three times a day (TID) | ORAL | Status: DC | PRN
Start: 2023-10-09 — End: 2023-10-13

## 2023-10-09 MED ORDER — QUETIAPINE FUMARATE 100 MG PO TABS
100.0000 mg | ORAL_TABLET | Freq: Every day | ORAL | Status: DC
  Administered 2023-10-09 – 2023-10-11 (×3): 100 mg via ORAL
  Filled 2023-10-09 (×3): qty 1

## 2023-10-09 MED ORDER — HALOPERIDOL LACTATE 5 MG/ML IJ SOLN: 5.0000 mg | Freq: Three times a day (TID) | INTRAMUSCULAR | Status: DC | PRN

## 2023-10-09 MED ORDER — TRAZODONE HCL 100 MG PO TABS
100.0000 mg | ORAL_TABLET | Freq: Every evening | ORAL | Status: DC | PRN
  Administered 2023-10-10 – 2023-10-13 (×3): 100 mg via ORAL
  Filled 2023-10-09 (×4): qty 1

## 2023-10-09 MED ORDER — ALUM & MAG HYDROXIDE-SIMETH 200-200-20 MG/5ML PO SUSP
30.0000 mL | ORAL | Status: DC | PRN
  Administered 2023-10-21: 30 mL via ORAL
  Filled 2023-10-09: qty 30

## 2023-10-09 MED ORDER — TRAZODONE HCL 50 MG PO TABS: 50.0000 mg | ORAL_TABLET | Freq: Every evening | ORAL | Status: DC | PRN

## 2023-10-09 MED ORDER — LORAZEPAM 2 MG/ML IJ SOLN
2.0000 mg | Freq: Three times a day (TID) | INTRAMUSCULAR | Status: DC | PRN
Start: 2023-10-09 — End: 2023-10-18

## 2023-10-09 MED ORDER — DIPHENHYDRAMINE HCL 25 MG PO CAPS: 50.0000 mg | ORAL_CAPSULE | Freq: Three times a day (TID) | ORAL | Status: DC | PRN

## 2023-10-09 MED ORDER — QUETIAPINE FUMARATE 25 MG PO TABS: 50.0000 mg | ORAL_TABLET | Freq: Every day | ORAL | Status: DC

## 2023-10-09 MED ORDER — GABAPENTIN 100 MG PO CAPS
200.0000 mg | ORAL_CAPSULE | Freq: Three times a day (TID) | ORAL | Status: DC
  Administered 2023-10-09 – 2023-10-12 (×8): 200 mg via ORAL
  Filled 2023-10-09 (×8): qty 2

## 2023-10-09 NOTE — ED Notes (Signed)
Patient participating in assessment with psych team at this time.
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
## 2023-10-09 NOTE — Tx Team (Signed)
Initial Treatment Plan 10/09/2023 4:43 AM [facility] [place] [id number]    PATIENT STRESSORS: Substance abuse   Traumatic event     PATIENT STRENGTHS: Average or above average intelligence  Capable of independent living    PATIENT IDENTIFIED PROBLEMS: Major Depressive Disorder  Substance abuse  Ineffective coping/ released from prison 2 weeks ago                 DISCHARGE CRITERIA:  Improved stabilization in mood, thinking, and/or behavior  PRELIMINARY DISCHARGE PLAN: Outpatient therapy  PATIENT/FAMILY INVOLVEMENT: This treatment plan has been presented to and reviewed with the patient, Jeremiah Newman, and/or family member, .  The patient and family have been given the opportunity to ask questions and make suggestions.  Shelia Media, RN 10/09/2023, 4:43 AM

## 2023-10-09 NOTE — ED Notes (Signed)
Pt. has been discharged to the b[facility] room #311

## 2023-10-09 NOTE — Progress Notes (Signed)
Pt calm and pleasant during assessment denying SI/HI/AVH. Pt stated he slept a lot today and he feels that's what he needs right now. Pt compliant with medication administration per MD orders. Pt given education, support, and encouragement to be active in his treatment plan. Pt being monitored Q 15 minutes for safety per unit protocol, remains safe on the unit

## 2023-10-09 NOTE — BH IP Treatment Plan (Signed)
Interdisciplinary Treatment and Diagnostic Plan Update  10/09/2023 Time of Session: 9:56 Jeremiah Newman MRN: [id number]  Principal Diagnosis: Major depressive disorder, recurrent episode, severe, with psychosis (HCC)  Secondary Diagnoses: Principal Problem:   Major depressive disorder, recurrent episode, severe, with psychosis (HCC)   Current Medications:  Current Facility-Administered Medications  Medication Dose Route Frequency Provider Last Rate Last Admin   acetaminophen (TYLENOL) tablet 650 mg  650 mg Oral Q6H PRN [name], NP       alum & mag hydroxide-simeth (MAALOX/MYLANTA) 200-200-20 MG/5ML suspension 30 mL  30 mL Oral Q4H PRN Dixon, [name], NP       diphenhydrAMINE (BENADRYL) capsule 50 mg  50 mg Oral TID PRN [name], NP       Or   diphenhydrAMINE (BENADRYL) injection 50 mg  50 mg Intramuscular TID PRN Dixon, [name], NP       haloperidol (HALDOL) tablet 5 mg  5 mg Oral TID PRN [name], NP       Or   haloperidol lactate (HALDOL) injection 5 mg  5 mg Intramuscular TID PRN [name], NP       hydrOXYzine (ATARAX) tablet 25 mg  25 mg Oral TID PRN [name], NP       LORazepam (ATIVAN) tablet 2 mg  2 mg Oral TID PRN [name], NP       Or   LORazepam (ATIVAN) injection 2 mg  2 mg Intramuscular TID PRN Dixon, [name], NP       magnesium hydroxide (MILK OF MAGNESIA) suspension 30 mL  30 mL Oral Daily PRN Dixon, [name], NP       QUEtiapine (SEROQUEL) tablet 50 mg  50 mg Oral QHS Dixon, [name], NP       traZODone (DESYREL) tablet 50 mg  50 mg Oral QHS PRN [name], NP       PTA Medications: No medications prior to admission.    Patient Stressors: Substance abuse   Traumatic event    Patient Strengths: Average or above average intelligence  Capable of independent living   Treatment Modalities: Medication Management, Group therapy, Case management,  1 to 1 session with clinician, Psychoeducation,  Recreational therapy.   Physician Treatment Plan for Primary Diagnosis: Major depressive disorder, recurrent episode, severe, with psychosis (HCC) Long Term Goal(s): Improvement in symptoms so as ready for discharge   Short Term Goals: Ability to identify changes in lifestyle to reduce recurrence of condition will improve Ability to verbalize feelings will improve Ability to disclose and discuss suicidal ideas Ability to demonstrate self-control will improve Ability to identify and develop effective coping behaviors will improve Ability to maintain clinical measurements within normal limits will improve Compliance with prescribed medications will improve Ability to identify triggers associated with substance abuse/mental health issues will improve  Medication Management: Evaluate patient's response, side effects, and tolerance of medication regimen.  Therapeutic Interventions: 1 to 1 sessions, Unit Group sessions and Medication administration.  Evaluation of Outcomes: Not Met  Physician Treatment Plan for Secondary Diagnosis: Principal Problem:   Major depressive disorder, recurrent episode, severe, with psychosis (HCC)  Long Term Goal(s): Improvement in symptoms so as ready for discharge   Short Term Goals: Ability to identify changes in lifestyle to reduce recurrence of condition will improve Ability to verbalize feelings will improve Ability to disclose and discuss suicidal ideas Ability to demonstrate self-control will improve Ability to identify and develop effective coping behaviors  will improve Ability to maintain clinical measurements within normal limits will improve Compliance with prescribed medications will improve Ability to identify triggers associated with substance abuse/mental health issues will improve     Medication Management: Evaluate patient's response, side effects, and tolerance of medication regimen.  Therapeutic Interventions: 1 to 1 sessions, Unit Group  sessions and Medication administration.  Evaluation of Outcomes: Not Met   RN Treatment Plan for Primary Diagnosis: Major depressive disorder, recurrent episode, severe, with psychosis (HCC) Long Term Goal(s): Knowledge of disease and therapeutic regimen to maintain health will improve  Short Term Goals: Ability to remain free from injury will improve, Ability to verbalize frustration and anger appropriately will improve, Ability to demonstrate self-control, Ability to participate in decision making will improve, Ability to verbalize feelings will improve, Ability to disclose and discuss suicidal ideas, and Ability to identify and develop effective coping behaviors will improve  Medication Management: RN will administer medications as ordered by provider, will assess and evaluate patient's response and provide education to patient for prescribed medication. RN will report any adverse and/or side effects to prescribing provider.  Therapeutic Interventions: 1 on 1 counseling sessions, Psychoeducation, Medication administration, Evaluate responses to treatment, Monitor vital signs and CBGs as ordered, Perform/monitor CIWA, COWS, AIMS and Fall Risk screenings as ordered, Perform wound care treatments as ordered.  Evaluation of Outcomes: Not Met   LCSW Treatment Plan for Primary Diagnosis: Major depressive disorder, recurrent episode, severe, with psychosis (HCC) Long Term Goal(s): Safe transition to appropriate next level of care at discharge, Engage patient in therapeutic group addressing interpersonal concerns.  Short Term Goals: Engage patient in aftercare planning with referrals and resources, Increase social support, Increase ability to appropriately verbalize feelings, Increase emotional regulation, Facilitate acceptance of mental health diagnosis and concerns, Facilitate patient progression through stages of change regarding substance use diagnoses and concerns, Identify triggers associated  with mental health/substance abuse issues, and Increase skills for wellness and recovery  Therapeutic Interventions: Assess for all discharge needs, 1 to 1 time with Social worker, Explore available resources and support systems, Assess for adequacy in community support network, Educate family and significant other(s) on suicide prevention, Complete Psychosocial Assessment, Interpersonal group therapy.  Evaluation of Outcomes: Not Met   Progress in Treatment: Attending groups: No. Participating in groups: No. Taking medication as prescribed: Yes. Toleration medication: Yes. Family/Significant other contact made: No, will contact:  CSW to contact once permission is granted.  Patient understands diagnosis: Yes. and No. Discussing patient identified problems/goals with staff: Yes. Medical problems stabilized or resolved: Yes. and No. Denies suicidal/homicidal ideation: Yes. Issues/concerns per patient self-inventory: Yes. Other: None  New problem(s) identified: Yes, Describe:  Patient is not comfortable at home and is interested in possible group home placement.   New Short Term/Long Term Goal(s):detox, elimination of symptoms of psychosis, medication management for mood stabilization; elimination of SI thoughts; development of comprehensive mental wellness/sobriety plan.    Patient Goals:  "Have peace in my life."  Discharge Plan or Barriers:   Reason for Continuation of Hospitalization: Anxiety Depression Medication stabilization Withdrawal symptoms  Estimated Length of Stay:1-7 days.   Last 3 [place] Suicide Severity Risk Score: Flowsheet Row Admission (Current) from 10/09/2023 in [facility] INPATIENT BE[facility]AL MEDICINE ED from 10/08/2023 in [facility] Emergency Department at [facility]  C-SSRS RISK CATEGORY Moderate Risk Moderate Risk       Last PHQ 2/9 Scores:     No data to display          Scribe  for Treatment Team: Lowry Ram, LCSW 10/09/2023 11:12  AM

## 2023-10-09 NOTE — ED Notes (Signed)
Pt instructed to obtain urine sample for admission.

## 2023-10-09 NOTE — Group Note (Signed)
Date:  10/09/2023 Time:  6:19 PM  Group Topic/Focus:  Wellness Toolbox:   The focus of this group is to discuss various aspects of wellness, balancing those aspects and exploring ways to increase the ability to experience wellness.  Patients will create a wellness toolbox for use upon discharge.    Participation Level:  Did Not Attend  Lynelle Smoke [facility] 10/09/2023, 6:19 PM

## 2023-10-09 NOTE — [facility] Suicide Risk Assessment (Cosign Needed Addendum)
[facility] Admission Suicide Risk Assessment   Nursing information obtained from:  Patient Demographic factors:  Male, Caucasian Current Mental Status:  NA Loss Factors:  NA Historical Factors:  NA Risk Reduction Factors:  NA  Total Time spent with patient: 1 hour Principal Problem: Major depressive disorder, recurrent episode, severe, with psychosis (HCC) Diagnosis:  Principal Problem:   Major depressive disorder, recurrent episode, severe, with psychosis (HCC)  Subjective Data: "I'm constantly peeping out the window, panicking, always fight or flight."   Continued Clinical Symptoms:  Alcohol Use Disorder Identification Test Final Score (AUDIT): 1 The "Alcohol Use Disorders Identification Test", Guidelines for Use in Primary Care, Second Edition.  World Science writer [facility]). Score between 0-7:  no or low risk or alcohol related problems. Score between 8-15:  moderate risk of alcohol related problems. Score between 16-19:  high risk of alcohol related problems. Score 20 or above:  warrants further diagnostic evaluation for alcohol dependence and treatment.   CLINICAL FACTORS:   Depression:   Anhedonia Comorbid alcohol abuse/dependence Insomnia   Musculoskeletal: Strength & Muscle Tone: within normal limits Gait & Station: normal Patient leans: N/A  Psychiatric Specialty Exam: Physical Exam Vitals and nursing note reviewed.  Constitutional:      Appearance: Normal appearance.  HENT:     Head: Normocephalic.     Nose: Nose normal.  Pulmonary:     Effort: Pulmonary effort is normal.  Musculoskeletal:        General: Normal range of motion.     Cervical back: Normal range of motion.  Neurological:     General: No focal deficit present.     Mental Status: He is alert and oriented to person, place, and time.     Review of Systems  Psychiatric/Behavioral:  Positive for depression. The patient is nervous/anxious.   All other systems reviewed and are negative.   Blood  pressure 118/69, pulse 71, temperature 98.1 F (36.7 C), temperature source Oral, resp. rate 14, height 5\' 8"  (1.727 m), weight 79.4 kg, SpO2 100%.Body mass index is 26.61 kg/m.  General Appearance: Disheveled  Eye Contact:  Fair  Speech:  Normal Rate  Volume:  Normal  Mood:  Anxious and Depressed  Affect:  Congruent  Thought Process:  Coherent and Descriptions of Associations: Intact  Orientation:  Full (Time, Place, and Person)  Thought Content:  Rumination  Suicidal Thoughts:  Yes.  without intent/plan  Homicidal Thoughts:  No  Memory:  Immediate;   Fair Recent;   Fair Remote;   Fair  Judgement:  Fair  Insight:  Fair  Psychomotor Activity:  Normal  Concentration:  Concentration: Fair and Attention Span: Fair  Recall:  Good  Fund of Knowledge:  Fair  Language:  Good  Akathisia:  No  Handed:  Right  AIMS (if indicated):     Assets:  Housing Leisure Time Physical Health Resilience  ADL's:  Intact  Cognition:  WNL  Sleep:         Physical Exam: Physical Exam Vitals and nursing note reviewed.  Constitutional:      Appearance: Normal appearance.  HENT:     Head: Normocephalic.     Nose: Nose normal.  Pulmonary:     Effort: Pulmonary effort is normal.  Musculoskeletal:        General: Normal range of motion.     Cervical back: Normal range of motion.  Neurological:     General: No focal deficit present.     Mental Status: He is alert and  oriented to person, place, and time.    Review of Systems  Psychiatric/Behavioral:  Positive for depression, substance abuse and suicidal ideas. The patient is nervous/anxious.   All other systems reviewed and are negative.  Blood pressure 118/69, pulse 71, temperature 98.1 F (36.7 C), temperature source Oral, resp. rate 14, height 5\' 8"  (1.727 m), weight 79.4 kg, SpO2 100%. Body mass index is 26.61 kg/m.   COGNITIVE FEATURES THAT CONTRIBUTE TO RISK:  None    SUICIDE RISK:   Minimal: No identifiable suicidal ideation.   Patients presenting with no risk factors but with morbid ruminations; may be classified as minimal risk based on the severity of the depressive symptoms  PLAN OF CARE:  Major depressive disorder, recurrent, severe with psychosis: Lexapro 10 mg daily  General anxiety disorder: Gabapentin 200 mg TID  Insomnia: Seroquel 100 mg daily at bedtime Trazodone 100 mg daily at bedtime PRN  I certify that inpatient services furnished can reasonably be expected to improve the patient's condition.   Nanine Means, NP 10/09/2023, 7:27 AM

## 2023-10-09 NOTE — Group Note (Signed)
Date:  10/09/2023 Time:  10:27 AM  Group Topic/Focus:  Activity Group:  The focus of the group is to encourage patients to come outside to the courtyard to get some fresh air and exercise for the benefit of their mental health,    Participation Level:  Did Not Attend    Jeremiah Newman 10/09/2023, 10:27 AM

## 2023-10-09 NOTE — Consult Note (Signed)
Telepsych Consultation   Reason for Consult:  Psych Evaluation Referring Physician:  Dr. Rosalia Hammers Location of Patient: [facility] ER Location of Provider: Behavioral Health TTS Department  Patient Identification: Jeremiah Newman MRN:  [id number] Principal Diagnosis: MDD (major depressive disorder), recurrent episode, severe (HCC) Diagnosis:  Principal Problem:   MDD (major depressive disorder), recurrent episode, severe (HCC) Active Problems:   Schizophrenia (HCC)   Suicidal ideation   PTSD (post-traumatic stress disorder)   Insomnia   Total Time spent with patient: 30 minutes  Subjective:   "I got out of prison 3 weeks ago"  HPI:  Tele psych Assessment   Jeremiah Newman, 32 y.o., male patient seen via tele health by TTS and this provider; chart reviewed and consulted with Dr. Silver Newman on 10/09/23.  On evaluation Jeremiah Newman reports "I can't sleep at night and I hear voices. My environment isn't good."  He says he does not take his medications any longer because he feels as thought it doesn't help him.  He says he takes Remeron at night because it helps him to sleep.  He says he is feeling so hopeless and helpless.  He wishes his life was better. He states that everyday is fight for his sanity , between the voices and the visions, he "can't take it anymore."  He admits that he does not want to kill himself, but he's tired, but would not mind being dead.Marland Kitchen He says he drank the day before yesterday, but no longer have a drinking or drug problem. He says he's having panic attacks and is unable to sleep.  He says that everything is fine until his mother starts drinking.  He says his mother is suffering and is difficult to watch, and as result he's suffering.   During evaluation Jeremiah Newman is sitting on his bed, he has a disheveled and ungroomed appearance he is alert/oriented x 4; anxious/ somber/ depressed/ cooperative; and mood congruent with affect.  Patient is speaking in a  clear tone at moderate volume, and normal pace; with good eye contact.  His thought process is coherent and relevant; There is no indication that he is currently responding to internal/external stimuli or experiencing delusional thought content.  Patient endorsed depressed mood, markedly diminished pleasure, decreased appetite, decreased sleep, and feelings of  guilt and worthlessness with passive SI.  Patient  has answered questions appropriately.    Recommendations:  Psychiatric Inpatient hospitalization  Dr. Rosalia Hammers informed of above recommendation and disposition   Past Psychiatric History: MDD, anxiety  Risk to Self:   Risk to Others:   Prior Inpatient Therapy:   Prior Outpatient Therapy:    Past Medical History: No past medical history on file. No past surgical history on file. Family History: No family history on file. Family Psychiatric  History: unknown Social History:  Social History   Substance and Sexual Activity  Alcohol Use Yes     Social History   Substance and Sexual Activity  Drug Use No    Social History   Socioeconomic History   Marital status: Single    Spouse name: Not on file   Number of children: Not on file   Years of education: Not on file   Highest education level: Not on file  Occupational History   Not on file  Tobacco Use   Smoking status: Some Days    Current packs/day: 1.00    Types: Cigarettes   Smokeless tobacco: Never  Substance and Sexual Activity  Alcohol use: Yes   Drug use: No   Sexual activity: Not on file  Other Topics Concern   Not on file  Social History Narrative   Not on file   Social Determinants of Health   Financial Resource Strain: Not on file  Food Insecurity: Not on file  Transportation Needs: Not on file  Physical Activity: Not on file  Stress: Not on file  Social Connections: Not on file   Additional Social History:    Allergies:  No Known Allergies  Labs:  Results for orders placed or performed during  the hospital encounter of 10/08/23 (from the past 48 hour(s))  Comprehensive metabolic panel     Status: Abnormal   Collection Time: 10/08/23 11:32 PM  Result Value Ref Range   Sodium 140 135 - 145 mmol/L   Potassium 3.9 3.5 - 5.1 mmol/L   Chloride 107 98 - 111 mmol/L   CO2 25 22 - 32 mmol/L   Glucose, Bld 111 (H) 70 - 99 mg/dL    Comment: Glucose reference range applies only to samples taken after fasting for at least 8 hours.   BUN 13 6 - 20 mg/dL   Creatinine, Ser 8.11 0.61 - 1.24 mg/dL   Calcium 9.4 8.9 - 91.4 mg/dL   Total Protein 7.8 6.5 - 8.1 g/dL   Albumin 4.5 3.5 - 5.0 g/dL   AST 18 15 - 41 U/L   ALT 17 0 - 44 U/L   Alkaline Phosphatase 83 38 - 126 U/L   Total Bilirubin 0.8 <1.2 mg/dL   GFR, Estimated >78 >29 mL/min    Comment: (NOTE) Calculated using the CKD-EPI Creatinine Equation (2021)    Anion gap 8 5 - 15    Comment: Performed at [facility], [address], [place], [place] [address]  Ethanol     Status: None   Collection Time: 10/08/23 11:32 PM  Result Value Ref Range   Alcohol, Ethyl (B) <10 <10 mg/dL    Comment: (NOTE) Lowest detectable limit for serum alcohol is 10 mg/dL.  For medical purposes only. Performed at [facility], [address] [place], [place] [address]   Salicylate level     Status: Abnormal   Collection Time: 10/08/23 11:32 PM  Result Value Ref Range   Salicylate Lvl <7.0 (L) 7.0 - 30.0 mg/dL    Comment: Performed at [facility], [address] [place], [place] [address]  Acetaminophen level     Status: Abnormal   Collection Time: 10/08/23 11:32 PM  Result Value Ref Range   Acetaminophen (Tylenol), Serum <10 (L) 10 - 30 ug/mL    Comment: (NOTE) Therapeutic concentrations vary significantly. A range of 10-30 ug/mL  may be an effective concentration for many patients. However, some  are best treated at concentrations outside of this range. Acetaminophen concentrations >150 ug/mL at 4 hours after  ingestion  and >50 ug/mL at 12 hours after ingestion are often associated with  toxic reactions.  Performed at [facility], [address] [place], [place] [address]   cbc     Status: Abnormal   Collection Time: 10/08/23 11:32 PM  Result Value Ref Range   WBC 11.1 (H) 4.0 - 10.5 K/uL   RBC 5.17 4.22 - 5.81 MIL/uL   Hemoglobin 15.6 13.0 - 17.0 g/dL   HCT 41.3 24.4 - 01.0 %   MCV 88.4 80.0 - 100.0 fL   MCH 30.2 26.0 - 34.0 pg   MCHC 34.1 30.0 -  36.0 g/dL   RDW 16.1 09.6 - 04.5 %   Platelets 285 150 - 400 K/uL   nRBC 0.0 0.0 - 0.2 %    Comment: Performed at [facility], [address] [place], [place] [address]    Medications:  No current facility-administered medications for this encounter.   Current Outpatient Medications  Medication Sig Dispense Refill   albuterol (PROVENTIL HFA;VENTOLIN HFA) 108 (90 Base) MCG/ACT inhaler Inhale 2 puffs into the lungs every 6 (six) hours as needed for wheezing or shortness of breath. 1 Inhaler 2   azithromycin (ZITHROMAX) 250 MG tablet 2 tablets today, then 1 tablet for the next 4 days. 6 each 0   cyclobenzaprine (FLEXERIL) 10 MG tablet Take 1 tablet (10 mg total) by mouth 2 (two) times daily as needed for muscle spasms. 10 tablet 0   diazepam (VALIUM) 2 MG tablet Take 1 tablet (2 mg total) by mouth every 8 (eight) hours as needed for muscle spasms. 15 tablet 0   guaiFENesin-codeine (ROBITUSSIN AC) 100-10 MG/5ML syrup Take 5 mLs by mouth 3 (three) times daily as needed for cough. 120 mL 0   ibuprofen (ADVIL,MOTRIN) 800 MG tablet Take 1 tablet (800 mg total) by mouth every 8 (eight) hours as needed for moderate pain. 15 tablet 0   methylPREDNISolone (MEDROL DOSEPAK) 4 MG TBPK tablet Take Tapered dose as directed 21 tablet 0   naproxen (NAPROSYN) 500 MG tablet Take 1 tablet (500 mg total) by mouth 2 (two) times daily. 10 tablet 0   naproxen (NAPROSYN) 500 MG tablet Take 1 tablet (500 mg total) by mouth 2 (two) times daily  with a meal. 20 tablet 0   ondansetron (ZOFRAN ODT) 4 MG disintegrating tablet 4mg  ODT q4 hours prn nausea/vomit 20 tablet 0   oxyCODONE-acetaminophen (ROXICET) 5-325 MG tablet Take 1 tablet by mouth every 4 (four) hours as needed for severe pain. 15 tablet 0    Musculoskeletal: Strength & Muscle Tone: within normal limits Gait & Station: normal Patient leans: N/A  Psychiatric Specialty Exam:  Presentation  General Appearance: Appropriate for Environment; Disheveled  Eye Contact:Fair  Speech:Clear and Coherent  Speech Volume:Normal  Handedness:Right   Mood and Affect  Mood:Anxious; Depressed; Dysphoric; Hopeless; Worthless  Affect:Congruent   Thought Process  Thought Processes:Coherent  Descriptions of Associations:Intact  Orientation:Full (Time, Place and Person)  Thought Content:WDL  History of Schizophrenia/Schizoaffective disorder:No data recorded Duration of Psychotic Symptoms:No data recorded Hallucinations:Hallucinations: Auditory; Visual  Ideas of Reference:Paranoia; Percusatory  Suicidal Thoughts:Suicidal Thoughts: Yes, Passive SI Passive Intent and/or Plan: Without Plan  Homicidal Thoughts:Homicidal Thoughts: No   Sensorium  Memory:Immediate Fair; Remote Fair  Judgment:Impaired  Insight:Fair   Executive Functions  Concentration:Fair  Attention Span:Fair  Recall:Fair  Fund of Knowledge:Fair  Language:Fair   Psychomotor Activity  Psychomotor Activity:Psychomotor Activity: Normal   Assets  Assets:Communication Skills; Desire for Improvement; Housing   Sleep  Sleep:Sleep: Poor    Physical Exam: Physical Exam Vitals and nursing note reviewed.  HENT:     Head: Normocephalic and atraumatic.     Nose: Nose normal.     Mouth/Throat:     Mouth: Mucous membranes are dry.  Eyes:     Pupils: Pupils are equal, round, and reactive to light.  Musculoskeletal:        General: Normal range of motion.  Skin:    General: Skin is  dry.  Neurological:     Mental Status: He is alert and oriented to person, place, and time.  Psychiatric:  Attention and Perception: Attention normal. He perceives auditory hallucinations.        Mood and Affect: Mood is anxious and depressed. Affect is tearful.        Speech: Speech normal.        Behavior: Behavior is cooperative.        Thought Content: Thought content is paranoid. Thought content includes suicidal ideation. Thought content does not include suicidal plan.        Judgment: Judgment is impulsive.    Review of Systems  Psychiatric/Behavioral:  Positive for depression, hallucinations and suicidal ideas. The patient is nervous/anxious and has insomnia.   All other systems reviewed and are negative.  Blood pressure 136/86, pulse 78, temperature 98.1 F (36.7 C), temperature source Oral, resp. rate 18, height 5\' 8"  (1.727 m), weight 79.4 kg, SpO2 98%. Body mass index is 26.61 kg/m.  Treatment Plan Summary: Daily contact with patient to assess and evaluate symptoms and progress in treatment, Medication management, and Plan  Jeremiah Newman was admitted to [facility] ER MDD (major depressive disorder), recurrent episode, severe (HCC), crisis management, and stabilization. Routine labs ordered, which include Lab Orders         Comprehensive metabolic panel         Ethanol         Salicylate level         Acetaminophen level         cbc         Urine Drug Screen, Qualitative    Medication Management: Medications started  Will maintain observation checks every 15 minutes for safety. Psychosocial education regarding relapse prevention and self-care; social and communication  Social work will consult with family for collateral information and discuss discharge and follow up plan.  Disposition: Recommend psychiatric Inpatient admission when medically cleared. Supportive therapy provided about ongoing stressors. Discussed crisis plan, support from social network, calling  911, coming to the Emergency Department, and calling Suicide Hotline.  This service was provided via telemedicine using a 2-way, interactive audio and video technology.    [name], NP 10/09/2023 1:09 AM

## 2023-10-09 NOTE — BH Assessment (Signed)
Comprehensive Clinical Assessment (CCA) Note  10/09/2023 [name] [id number] Recommendations for Services/Supports/Treatments: Psych NP Rashaun D. determined pt. meets psychiatric inpatient criteria.  Jeremiah Newman is a 32 y.o., Caucasian, English speaking male with a history of PTSD and schizophrenia. Per triage note: Pt presents to ER with c/o needing help with mental health issues. Pt reports he just got out of prison 2-3 weeks ago, and has been having issues with hearing voices, not sleeping as well, and has not been taking his meds for a while as he states they don't work for him. Pt reports he is back home with family and it has been a very toxic environment for him, and his stress has been getting worse. Pt states he has been having SI as well. No specific plan in mind. Pt otherwise calm and cooperative in triage and is A&O x4 and in NAD at this time.    Pt presented with a depressed mood and a congruent affect. Pt was alert and oriented x3. Pt identified the reason he'd presented to the ED as family conflict and issues adjusting post prison release. Pt explained that his parents are alcoholics and the stress of living in the environment is overwhelming and adversely impacts his mental health. Pt reported that he has not been on any mental health medications since he was released from prison October 11. Pt reported having worsening hallucinations, paranoia, and depressive sx. Pt denied substance use; however, pt admitted that he'd used marijuana just prior to leaving prison. Pt had good insight and impaired judgment. Pt expressed a desire for help, explaining that that his current medications are ineffective and that he feels like ending things. Pt endorsed having sleep disturbance. Pt presented with restless psychomotor activity. Pt denies HI. Pt continued to endorse passive SI and AV/H and became tearful when describing his living situation.   Chief Complaint:  Chief Complaint   Patient presents with   Mental Health Problem   Visit Diagnosis: MDD (major depressive disorder), recurrent episode, severe (HCC) Active Problems:   Schizophrenia (HCC)   Suicidal ideation   PTSD (post-traumatic stress disorder)   Insomnia    CCA Screening, Triage and Referral (STR)  Patient Reported Information How did you hear about [place]? Self  Referral name: No data recorded Referral phone number: No data recorded  Whom do you see for routine medical problems? No data recorded Practice/Facility Name: No data recorded Practice/Facility Phone Number: No data recorded Name of Contact: No data recorded Contact Number: No data recorded Contact Fax Number: No data recorded Prescriber Name: No data recorded Prescriber Address (if known): No data recorded  What Is the Reason for Your Visit/Call Today? No data recorded How Long Has This Been Causing You Problems? No data recorded What Do You Feel Would Help You the Most Today? No data recorded  Have You Recently Been in Any Inpatient Treatment (Hospital/Detox/Crisis Center/28-Day Program)? No data recorded Name/Location of Program/Hospital:No data recorded How Long Were You There? No data recorded When Were You Discharged? No data recorded  Have You Ever Received Services From [facility] Before? No data recorded Who Do You See at [facility]? No data recorded  Have You Recently Had Any Thoughts About Hurting Yourself? No data recorded Are You Planning to Commit Suicide/Harm Yourself At This time? No data recorded  Have you Recently Had Thoughts About Hurting Someone [name]? No data recorded Explanation: No data recorded  Have You Used Any Alcohol or Drugs in the Past 24 Hours?  No data recorded How Long Ago Did You Use Drugs or Alcohol? No data recorded What Did You Use and How Much? No data recorded  Do You Currently Have a Therapist/Psychiatrist? No data recorded Name of Therapist/Psychiatrist: No data recorded  Have  You Been Recently Discharged From Any Office Practice or Programs? No data recorded Explanation of Discharge From Practice/Program: No data recorded    CCA Screening Triage Referral Assessment Type of Contact: No data recorded Is this Initial or Reassessment? No data recorded Date Telepsych consult ordered in [facility]:  No data recorded Time Telepsych consult ordered in [facility]:  No data recorded  Patient Reported Information Reviewed? No data recorded Patient Left Without Being Seen? No data recorded Reason for Not Completing Assessment: No data recorded  Collateral Involvement: No data recorded  Does Patient Have a Court Appointed Legal Guardian? No data recorded Name and Contact of Legal Guardian: No data recorded If Minor and Not Living with Parent(s), Who has Custody? No data recorded Is CPS involved or ever been involved? No data recorded Is APS involved or ever been involved? No data recorded  Patient Determined To Be At Risk for Harm To Self or Others Based on Review of Patient Reported Information or Presenting Complaint? No data recorded Method: No data recorded Availability of Means: No data recorded Intent: No data recorded Notification Required: No data recorded Additional Information for Danger to Others Potential: No data recorded Additional Comments for Danger to Others Potential: No data recorded Are There Guns or Other Weapons in Your Home? No data recorded Types of Guns/Weapons: No data recorded Are These Weapons Safely Secured?                            No data recorded Who Could Verify You Are Able To Have These Secured: No data recorded Do You Have any Outstanding Charges, Pending Court Dates, Parole/Probation? No data recorded Contacted To Inform of Risk of Harm To Self or Others: No data recorded  Location of Assessment: No data recorded  Does Patient Present under Involuntary Commitment? No data recorded IVC Papers Initial File Date: No data recorded  [place]  of Residence: No data recorded  Patient Currently Receiving the Following Services: No data recorded  Determination of Need: No data recorded  Options For Referral: No data recorded    CCA Biopsychosocial Intake/Chief Complaint:  No data recorded Current Symptoms/Problems: No data recorded  Patient Reported Schizophrenia/Schizoaffective Diagnosis in Past: No data recorded  Strengths: No data recorded Preferences: No data recorded Abilities: No data recorded  Type of Services Patient Feels are Needed: No data recorded  Initial Clinical Notes/Concerns: No data recorded  Mental Health Symptoms Depression:  No data recorded  Duration of Depressive symptoms: No data recorded  Mania:  No data recorded  Anxiety:   No data recorded  Psychosis:  No data recorded  Duration of Psychotic symptoms: No data recorded  Trauma:  No data recorded  Obsessions:  No data recorded  Compulsions:  No data recorded  Inattention:  No data recorded  Hyperactivity/Impulsivity:  No data recorded  Oppositional/Defiant Behaviors:  No data recorded  Emotional Irregularity:  No data recorded  Other Mood/Personality Symptoms:  No data recorded   Mental Status Exam Appearance and self-care  Stature:  No data recorded  Weight:  No data recorded  Clothing:  No data recorded  Grooming:  No data recorded  Cosmetic use:  No data recorded  Posture/gait:  No data recorded  Motor activity:  No data recorded  Sensorium  Attention:  No data recorded  Concentration:  No data recorded  Orientation:  No data recorded  Recall/memory:  No data recorded  Affect and Mood  Affect:  No data recorded  Mood:  No data recorded  Relating  Eye contact:  No data recorded  Facial expression:  No data recorded  Attitude toward examiner:  No data recorded  Thought and Language  Speech flow: No data recorded  Thought content:  No data recorded  Preoccupation:  No data recorded  Hallucinations:  No data recorded   Organization:  No data recorded  [employer] of Knowledge:  No data recorded  Intelligence:  No data recorded  Abstraction:  No data recorded  Judgement:  No data recorded  Reality Testing:  No data recorded  Insight:  No data recorded  Decision Making:  No data recorded  Social Functioning  Social Maturity:  No data recorded  Social Judgement:  No data recorded  Stress  Stressors:  No data recorded  Coping Ability:  No data recorded  Skill Deficits:  No data recorded  Supports:  No data recorded    Religion:    Leisure/Recreation:    Exercise/Diet:     CCA Employment/Education Employment/Work Situation:    Education:     CCA Family/Childhood History Family and Relationship History:    Childhood History:     Child/Adolescent Assessment:     CCA Substance Use Alcohol/Drug Use:                           ASAM's:  Six Dimensions of Multidimensional Assessment  Dimension 1:  Acute Intoxication and/or Withdrawal Potential:      Dimension 2:  Biomedical Conditions and Complications:      Dimension 3:  Emotional, Behavioral, or Cognitive Conditions and Complications:     Dimension 4:  Readiness to Change:     Dimension 5:  Relapse, Continued use, or Continued Problem Potential:     Dimension 6:  Recovery/Living Environment:     ASAM Severity Score:    ASAM Recommended Level of Treatment:     Substance use Disorder (SUD)    Recommendations for Services/Supports/Treatments:    DSM5 Diagnoses: Patient Active Problem List   Diagnosis Date Noted   MDD (major depressive disorder), recurrent episode, severe (HCC) 10/09/2023   Schizophrenia (HCC) 10/09/2023   Suicidal ideation 10/09/2023   PTSD (post-traumatic stress disorder) 10/09/2023   Insomnia 10/09/2023   Cocaine abuse (HCC) 04/23/2017   Blackout 04/23/2017   Substance induced mood disorder (HCC) 03/18/2017   Alcohol abuse 03/18/2017   Jenalee Trevizo R Lenell Lama, LCAS

## 2023-10-09 NOTE — Group Note (Signed)
Recreation Therapy Group Note   Group Topic:Goal Setting  Group Date: 10/09/2023 Start Time: 1015 End Time: 1115 Facilitators: Rosina Lowenstein, LRT, CTRS Location:  Dayroom  Group Description: Vision Boards. Patients were given many different magazines, a glue stick, markers, and a piece of cardstock paper. LRT and pts discussed the importance of having goals in life. LRT and pts discussed the difference between short-term and long-term goals, as well as what a SMART goal is. LRT encouraged pts to create a vision board, with images they picked and then cut out with safety scissors from the magazine, for themselves, that capture their short and long-term goals. LRT encouraged pts to show and explain their vision board to the group.   Goal Area(s) Addressed:  Patient will gain knowledge of short vs. long term goals.  Patient will identify goals for themselves. Patient will practice setting SMART goals. Patient will verbalize their goals to LRT and peers.   Affect/Mood: N/A   Participation Level: Did not attend    Clinical Observations/Individualized Feedback: Yahsir did not attend group.   Plan: Continue to engage patient in RT group sessions 2-3x/week.   Rosina Lowenstein, LRT, CTRS 10/09/2023 11:40 AM

## 2023-10-09 NOTE — ED Provider Notes (Signed)
[facility] Provider Note    Event Date/Time   First MD Initiated Contact with Patient 10/08/23 2343     (approximate)   History   Mental Health Problem   HPI  Jeremiah Newman is a 32 year old male with presenting to the emergency department with behavioral health concerns.  Patient reports he has been incarcerated for the past 6 years.  Reports he was diagnosed with PTSD, bipolar/schizophrenia.  He was recently released and reports that his home has been a stressful environment and he is having worsening mental health problems.  Reports hearing voices, poor sleep.  Reports he has had suicidal thoughts, but denies them currently and denies a specific plan.  Reported that he has not been taking his medications in triage.      Physical Exam   Triage Vital Signs: ED Triage Vitals  Encounter Vitals Group     BP 10/08/23 2327 136/86     Systolic BP Percentile --      Diastolic BP Percentile --      Pulse Rate 10/08/23 2327 78     Resp 10/08/23 2327 18     Temp 10/08/23 2327 98.1 F (36.7 C)     Temp Source 10/08/23 2327 Oral     SpO2 10/08/23 2327 98 %     Weight 10/08/23 2332 175 lb (79.4 kg)     Height 10/08/23 2332 5\' 8"  (1.727 m)     Head Circumference --      Peak Flow --      Pain Score 10/08/23 2332 5     Pain Loc --      Pain Education --      Exclude from Growth Chart --     Most recent vital signs: Vitals:   10/08/23 2327  BP: 136/86  Pulse: 78  Resp: 18  Temp: 98.1 F (36.7 C)  SpO2: 98%     General: Awake, interactive  CV:  Regular rate, good peripheral perfusion.  Resp:  Unlabored respirations.  Abd:  Nondistended.  Neuro:  Symmetric facial movement, fluid speech   ED Results / Procedures / Treatments   Labs (all labs ordered are listed, but only abnormal results are displayed) Labs Reviewed  COMPREHENSIVE METABOLIC PANEL - Abnormal; Notable for the following components:      Result Value   Glucose, Bld 111 (*)     All other components within normal limits  SALICYLATE LEVEL - Abnormal; Notable for the following components:   Salicylate Lvl <7.0 (*)    All other components within normal limits  ACETAMINOPHEN LEVEL - Abnormal; Notable for the following components:   Acetaminophen (Tylenol), Serum <10 (*)    All other components within normal limits  CBC - Abnormal; Notable for the following components:   WBC 11.1 (*)    All other components within normal limits  ETHANOL  URINE DRUG SCREEN, QUALITATIVE ([facility] ONLY)     EKG EKG independently reviewed interpreted by myself (ER attending) demonstrates:    RADIOLOGY Imaging independently reviewed and interpreted by myself demonstrates:    PROCEDURES:  Critical Care performed: No  Procedures   MEDICATIONS ORDERED IN ED: Medications  QUEtiapine (SEROQUEL) tablet 50 mg (50 mg Oral Given 10/09/23 0118)     IMPRESSION / MDM / ASSESSMENT AND PLAN / ED COURSE  I reviewed the triage vital signs and the nursing notes.  Differential diagnosis includes, but is not limited to, decompensated primary psychiatric disorder, substance-induced mood disorder, acute stress response  Patient's presentation is most consistent with acute presentation with potential threat to life or bodily function.  32 year old male presenting to the emergency department for evaluation of behavioral health concerns.  No acute medical concerns.  Vital stable on presentation.  UDS pending, labs otherwise reassuring, do think patient can be medically cleared.  Psychiatry and TTS consulted.  No active SI or plan, will hold off on IVC for now.  The patient has been placed in psychiatric observation due to the need to provide a safe environment for the patient while obtaining psychiatric consultation and evaluation, as well as ongoing medical and medication management to treat the patient's condition.  The patient has not been placed under full IVC at this time.       FINAL  CLINICAL IMPRESSION(S) / ED DIAGNOSES   Final diagnoses:  Suicidal ideation     Rx / DC Orders   ED Discharge Orders     None        Note:  This document was prepared using Dragon voice recognition software and may include unintentional dictation errors.   Trinna Post, MD 10/09/23 [phone]

## 2023-10-09 NOTE — Plan of Care (Signed)
  Problem: Education: Goal: Knowledge of [facility] Education information/materials will improve Outcome: Progressing Goal: Emotional status will improve Outcome: Progressing   

## 2023-10-09 NOTE — Group Note (Signed)
Date:  10/09/2023 Time:  9:59 PM  Group Topic/Focus:  Orientation:   The focus of this group is to educate the patient on the purpose and policies of crisis stabilization and provide a format to answer questions about their admission.  The group details unit policies and expectations of patients while admitted.    Participation Level:  Did Not Attend  Participation Quality:   none  Affect:   none  Cognitive:   none  Insight: None  Engagement in Group:   none  Modes of Intervention:   none  Additional Comments:  none   Monserratt Knezevic 10/09/2023, 9:59 PM

## 2023-10-09 NOTE — Progress Notes (Signed)
Patient admitted from [facility] - ED, report received form Herbert Seta, RN. Pt calm and pleasant during assessment. Pt stated he was recently released form prison which he was in for the past 6 years. Pt stated his home isn't a safe environment for him. Pt stated he needs help with his mental health and probably housing. Pt given education, support, and encouragement to be active in his treatment plan. Pt skin assessment completed with Marylu Lund, RN, no abnormalities just scattered tattoos. Pt oriented to his room and the unit. Pt being monitored Q 15 minutes for safety per unit protocol, remains safe on the unit

## 2023-10-10 DIAGNOSIS — F333 Major depressive disorder, recurrent, severe with psychotic symptoms: Secondary | ICD-10-CM | POA: Diagnosis not present

## 2023-10-10 LAB — HEMOGLOBIN A1C
Hgb A1c MFr Bld: 4.9 % (ref 4.8–5.6)
Mean Plasma Glucose: 93.93 mg/dL

## 2023-10-10 LAB — LIPID PANEL
Cholesterol: 214 mg/dL — ABNORMAL HIGH (ref 0–200)
HDL: 41 mg/dL (ref >40)
LDL Cholesterol: 146 mg/dL — ABNORMAL HIGH (ref 0–99)
Total CHOL/HDL Ratio: 5.2 {ratio}
Triglycerides: 135 mg/dL (ref <150)
VLDL: 27 mg/dL (ref 0–40)

## 2023-10-10 LAB — TSH: TSH: 0.925 u[IU]/mL (ref 0.350–4.500)

## 2023-10-10 NOTE — Progress Notes (Addendum)
[facility] MD Progress Note  10/10/2023 7:16 AM Jeremiah Newman  MRN:  [id number]  Subjective:  Notes, labs, and vital signs reviewed. Client resting in bed when entering the room. "I am just relaxing, my thoughts are wondering". Reports anxiety is 2/10 when alone, when around people anxiety is 10/10, denies panic attacks. Reports "My depression is always high, that never changes", denies current suicidal ideation. Reports sleeping well and his appetite is getting better. Denies medication side effects, paranoia and homicidal ideations. Reports an internal dialogue with himself, his good and bad side. Reports seeing characters like Sponge Nadine Counts or Luisa Hart pop up around his room, on the floor or in the paint, states this occurs daily.   Principal Problem: Major depressive disorder, recurrent episode, severe, with psychosis (HCC) Diagnosis: Principal Problem:   Major depressive disorder, recurrent episode, severe, with psychosis (HCC) Active Problems:   PTSD (post-traumatic stress disorder)   Insomnia  Total Time spent with patient: 30 minutes  Past Psychiatric History: depression, anxiety, substance abuse  Past Medical History: History reviewed. No pertinent past medical history. History reviewed. No pertinent surgical history. Family History: History reviewed. No pertinent family history. Family Psychiatric  History: mother with alcohol use disorder Social History:  Social History   Substance and Sexual Activity  Alcohol Use Yes     Social History   Substance and Sexual Activity  Drug Use No    Social History   Socioeconomic History   Marital status: Single    Spouse name: Not on file   Number of children: Not on file   Years of education: Not on file   Highest education level: Not on file  Occupational History   Not on file  Tobacco Use   Smoking status: Some Days    Current packs/day: 1.00    Average packs/day: 1 pack/day for 8.8 years (8.8 ttl pk-yrs)    Types: Cigarettes     Start date: 2016   Smokeless tobacco: Never  Substance and Sexual Activity   Alcohol use: Yes   Drug use: No   Sexual activity: Not Currently  Other Topics Concern   Not on file  Social History Narrative   Not on file   Social Determinants of Health   Financial Resource Strain: Not on file  Food Insecurity: No Food Insecurity (10/09/2023)   Hunger Vital Sign    Worried About Running Out of Food in the Last Year: Never true    Ran Out of Food in the Last Year: Never true  Transportation Needs: No Transportation Needs (10/09/2023)   PRAPARE - Administrator, Civil Service (Medical): No    Lack of Transportation (Non-Medical): No  Physical Activity: Not on file  Stress: Not on file  Social Connections: Not on file   Additional Social History: lives with his parents    Sleep: Good  Appetite:  Good  Current Medications: Current Facility-Administered Medications  Medication Dose Route Frequency Provider Last Rate Last Admin   acetaminophen (TYLENOL) tablet 650 mg  650 mg Oral Q6H PRN Dixon, [name], NP       alum & mag hydroxide-simeth (MAALOX/MYLANTA) 200-200-20 MG/5ML suspension 30 mL  30 mL Oral Q4H PRN Dixon, [name], NP       diphenhydrAMINE (BENADRYL) capsule 50 mg  50 mg Oral TID PRN [name], NP       Or   diphenhydrAMINE (BENADRYL) injection 50 mg  50 mg Intramuscular TID PRN [name], NP  escitalopram (LEXAPRO) tablet 10 mg  10 mg Oral Daily Charm Rings, NP   10 mg at 10/09/23 1822   gabapentin (NEURONTIN) capsule 200 mg  200 mg Oral TID Charm Rings, NP   200 mg at 10/09/23 1821   haloperidol (HALDOL) tablet 5 mg  5 mg Oral TID PRN [name], NP       Or   haloperidol lactate (HALDOL) injection 5 mg  5 mg Intramuscular TID PRN [name], NP       hydrOXYzine (ATARAX) tablet 25 mg  25 mg Oral TID PRN [name], NP       LORazepam (ATIVAN) tablet 2 mg  2 mg Oral TID PRN [name], NP       Or    LORazepam (ATIVAN) injection 2 mg  2 mg Intramuscular TID PRN [name], NP       magnesium hydroxide (MILK OF MAGNESIA) suspension 30 mL  30 mL Oral Daily PRN [name], NP       QUEtiapine (SEROQUEL) tablet 100 mg  100 mg Oral QHS Charm Rings, NP   100 mg at 10/09/23 2127   traZODone (DESYREL) tablet 100 mg  100 mg Oral QHS PRN Charm Rings, NP        Lab Results:  Results for orders placed or performed during the hospital encounter of 10/08/23 (from the past 48 hour(s))  Comprehensive metabolic panel     Status: Abnormal   Collection Time: 10/08/23 11:32 PM  Result Value Ref Range   Sodium 140 135 - 145 mmol/L   Potassium 3.9 3.5 - 5.1 mmol/L   Chloride 107 98 - 111 mmol/L   CO2 25 22 - 32 mmol/L   Glucose, Bld 111 (H) 70 - 99 mg/dL    Comment: Glucose reference range applies only to samples taken after fasting for at least 8 hours.   BUN 13 6 - 20 mg/dL   Creatinine, Ser 2.95 0.61 - 1.24 mg/dL   Calcium 9.4 8.9 - 62.1 mg/dL   Total Protein 7.8 6.5 - 8.1 g/dL   Albumin 4.5 3.5 - 5.0 g/dL   AST 18 15 - 41 U/L   ALT 17 0 - 44 U/L   Alkaline Phosphatase 83 38 - 126 U/L   Total Bilirubin 0.8 <1.2 mg/dL   GFR, Estimated >30 >86 mL/min    Comment: (NOTE) Calculated using the CKD-EPI Creatinine Equation (2021)    Anion gap 8 5 - 15    Comment: Performed at [facility], [address], [place], [place] [address]  Ethanol     Status: None   Collection Time: 10/08/23 11:32 PM  Result Value Ref Range   Alcohol, Ethyl (B) <10 <10 mg/dL    Comment: (NOTE) Lowest detectable limit for serum alcohol is 10 mg/dL.  For medical purposes only. Performed at [facility], [address] [place], [place] [address]   Salicylate level     Status: Abnormal   Collection Time: 10/08/23 11:32 PM  Result Value Ref Range   Salicylate Lvl <7.0 (L) 7.0 - 30.0 mg/dL    Comment: Performed at [facility], [address] [place], [place] [address]   Acetaminophen level     Status: Abnormal   Collection Time: 10/08/23 11:32 PM  Result Value Ref Range   Acetaminophen (Tylenol), Serum <10 (L) 10 - 30 ug/mL    Comment: (NOTE) Therapeutic concentrations vary significantly. A range  of 10-30 ug/mL  may be an effective concentration for many patients. However, some  are best treated at concentrations outside of this range. Acetaminophen concentrations >150 ug/mL at 4 hours after ingestion  and >50 ug/mL at 12 hours after ingestion are often associated with  toxic reactions.  Performed at [facility], [address] [place], [place] [address]   cbc     Status: Abnormal   Collection Time: 10/08/23 11:32 PM  Result Value Ref Range   WBC 11.1 (H) 4.0 - 10.5 K/uL   RBC 5.17 4.22 - 5.81 MIL/uL   Hemoglobin 15.6 13.0 - 17.0 g/dL   HCT 64.4 03.4 - 74.2 %   MCV 88.4 80.0 - 100.0 fL   MCH 30.2 26.0 - 34.0 pg   MCHC 34.1 30.0 - 36.0 g/dL   RDW 59.5 63.8 - 75.6 %   Platelets 285 150 - 400 K/uL   nRBC 0.0 0.0 - 0.2 %    Comment: Performed at [facility], [address], [place], [place] [address]  Urine Drug Screen, Qualitative     Status: Abnormal   Collection Time: 10/09/23  3:36 AM  Result Value Ref Range   Tricyclic, Ur Screen NONE DETECTED NONE DETECTED   Amphetamines, Ur Screen NONE DETECTED NONE DETECTED   MDMA (Ecstasy)Ur Screen NONE DETECTED NONE DETECTED   Cocaine Metabolite,Ur [place] NONE DETECTED NONE DETECTED   Opiate, Ur Screen NONE DETECTED NONE DETECTED   Phencyclidine (PCP) Ur S NONE DETECTED NONE DETECTED   Cannabinoid 50 Ng, Ur [place] POSITIVE (A) NONE DETECTED   Barbiturates, Ur Screen NONE DETECTED NONE DETECTED   Benzodiazepine, Ur Scrn NONE DETECTED NONE DETECTED   Methadone Scn, Ur NONE DETECTED NONE DETECTED    Comment: (NOTE) Tricyclics + metabolites, urine    Cutoff 1000 ng/mL Amphetamines + metabolites, urine  Cutoff 1000 ng/mL MDMA (Ecstasy), urine              Cutoff 500 ng/mL Cocaine  Metabolite, urine          Cutoff 300 ng/mL Opiate + metabolites, urine        Cutoff 300 ng/mL Phencyclidine (PCP), urine         Cutoff 25 ng/mL Cannabinoid, urine                 Cutoff 50 ng/mL Barbiturates + metabolites, urine  Cutoff 200 ng/mL Benzodiazepine, urine              Cutoff 200 ng/mL Methadone, urine                   Cutoff 300 ng/mL  The urine drug screen provides only a preliminary, unconfirmed analytical test result and should not be used for non-medical purposes. Clinical consideration and professional judgment should be applied to any positive drug screen result due to possible interfering substances. A more specific alternate chemical method must be used in order to obtain a confirmed analytical result. Gas chromatography / mass spectrometry (GC/MS) is the preferred confirm atory method. Performed at [facility], [address] [place], [place] [address]     Blood Alcohol level:  Lab Results  Component Value Date   ETH <10 10/08/2023   ETH 215 (H) 04/22/2017    Metabolic Disorder Labs: No results found for: "HGBA1C", "MPG" No results found for: "PROLACTIN" No results found for: "CHOL", "TRIG", "HDL", "CHOLHDL", "VLDL", "LDLCALC"   Musculoskeletal: Strength & Muscle Tone: within normal limits Gait & Station: normal  Patient leans: N/A  Psychiatric Specialty Exam: Physical Exam Vitals and nursing note reviewed.  Constitutional:      Appearance: Normal appearance.  HENT:     Head: Normocephalic.     Nose: Nose normal.  Pulmonary:     Effort: Pulmonary effort is normal.  Musculoskeletal:        General: Normal range of motion.     Cervical back: Normal range of motion.  Neurological:     General: No focal deficit present.     Mental Status: He is alert and oriented to person, place, and time.     Review of Systems  Psychiatric/Behavioral:  Positive for depression and hallucinations. The patient is nervous/anxious.   All other  systems reviewed and are negative.   Blood pressure 109/62, pulse 61, temperature 98.4 F (36.9 C), temperature source Oral, resp. rate 18, height 5\' 8"  (1.727 m), weight 79.4 kg, SpO2 99%.Body mass index is 26.61 kg/m.  General Appearance: Fairly Groomed  Eye Contact:  Good  Speech:  Normal Rate  Volume:  Normal  Mood:  Anxious and Depressed  Affect:  Depressed and Flat  Thought Process:  Coherent  Orientation:  Full (Time, Place, and Person)  Thought Content:  Hallucinations: Visual  Suicidal Thoughts:  No  Homicidal Thoughts:  No  Memory:  Immediate;   Fair Recent;   Fair Remote;   Fair  Judgement:  Fair  Insight:  Lacking  Psychomotor Activity:  Normal  Concentration:  Concentration: Fair and Attention Span: Fair  Recall:  Good  Fund of Knowledge:  Fair  Language:  Good  Akathisia:  NA  Handed:  Right  AIMS (if indicated):     Assets:  Communication Skills Desire for Improvement Resilience  ADL's:  Intact  Cognition:  WNL  Sleep:  Good     Physical Exam: Physical Exam Vitals and nursing note reviewed.  Constitutional:      Appearance: Normal appearance.  HENT:     Head: Normocephalic.     Nose: Nose normal.  Pulmonary:     Effort: Pulmonary effort is normal.  Musculoskeletal:        General: Normal range of motion.     Cervical back: Normal range of motion.  Neurological:     General: No focal deficit present.     Mental Status: He is alert and oriented to person, place, and time.    Review of Systems  Psychiatric/Behavioral:  Positive for depression and hallucinations. The patient is nervous/anxious.   All other systems reviewed and are negative.  Blood pressure 109/62, pulse 61, temperature 98.4 F (36.9 C), temperature source Oral, resp. rate 18, height 5\' 8"  (1.727 m), weight 79.4 kg, SpO2 99%. Body mass index is 26.61 kg/m.   Treatment Plan Summary: Daily contact with patient to assess and evaluate symptoms and progress in treatment,  Medication management, and Plan : Major depressive disorder, recurrent, severe with psychosis: Lexapro 10 mg daily   General anxiety disorder: Gabapentin 200 mg TID   Insomnia: Seroquel 100 mg daily at bedtime increased to 150 mg--lipid panel with elevated cholesterol of 214 and LDLs of 146, EKG & A1C Pending, and TSH 0.925 Trazodone 100 mg daily at bedtime PRN   Nanine Means, NP 10/10/2023, 7:16 AM

## 2023-10-10 NOTE — Group Note (Unsigned)
Date:  10/10/2023 Time:  9:22 PM  Group Topic/Focus:  Wrap-Up Group:   The focus of this group is to help patients review their daily goal of treatment and discuss progress on daily workbooks.     Participation Level:  {[facility] PARTICIPATION WUJWJ:19147}  Participation Quality:  {[facility] PARTICIPATION QUALITY:22265}  Affect:  {[facility] AFFECT:22266}  Cognitive:  {[facility] COGNITIVE:22267}  Insight: {BHH Insight2:20797}  Engagement in Group:  {B[facility] ENGAGEMENT IN WGNFA:21308}  Modes of Intervention:  {B[facility] MODES OF INTERVENTION:22269}  Additional Comments:  ***  [name] 10/10/2023, 9:22 PM

## 2023-10-10 NOTE — Group Note (Signed)
Date:  10/10/2023 Time:  9:31 PM  Group Topic/Focus:  Wrap-Up Group:   The focus of this group is to help patients review their daily goal of treatment and discuss progress on daily workbooks.    Participation Level:  Active  Participation Quality:  Appropriate, Attentive, Sharing, and Supportive  Affect:  Appropriate  Cognitive:  Appropriate  Insight: Appropriate and Good  Engagement in Group:  Supportive  Modes of Intervention:  Discussion and Support  Additional Comments:     [name] 10/10/2023, 9:31 PM

## 2023-10-10 NOTE — [facility] Suicide Risk Assessment (Signed)
[facility] INPATIENT:  Family/Significant Other Suicide Prevention Education  Suicide Prevention Education:  Patient Refusal for Family/Significant Other Suicide Prevention Education: The patient [name] has refused to provide written consent for family/significant other to be provided Family/Significant Other Suicide Prevention Education during admission and/or prior to discharge.  Physician notified.  Lowry Ram 10/10/2023, 3:16 PM
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
## 2023-10-10 NOTE — Group Note (Signed)
Recreation Therapy Group Note   Group Topic:Relaxation  Group Date: 10/10/2023 Start Time: 1005 End Time: 1055 Facilitators: Rosina Lowenstein, LRT, CTRS Location:  [facility]  Group Description: PMR (Progressive Muscle Relaxation). LRT asks patients their current level of stress/anxiety from 1-10, with 10 being the highest. LRT educates patients on what PMR is and the benefits that come from it. Patients are asked to sit with their feet flat on the floor while sitting up and all the way back in their chair, if possible. LRT and pts follow a prompt through a speaker that requires you to tense and release different muscles in their body and focus on their breathing. During session, lights are off and soft music is being played. Pts are given a stress ball to use if needed. At the end of the prompt, LRT asks patients to rank their current levels of stress/anxiety from 1-10, 10 being the highest. LRT provides patients with an education handout on PMR.   Goal Area(s) Addressed:  Patients will be able to describe progressive muscle relaxation.  Patient will practice using relaxation technique. Patient will identify a new coping skill.  Patient will follow multistep directions to reduce anxiety and stress.   Affect/Mood: N/A   Participation Level: Did not attend    Clinical Observations/Individualized Feedback: [place] did not attend group.   Plan: Continue to engage patient in RT group sessions 2-3x/week.   Rosina Lowenstein, LRT, CTRS 10/10/2023 11:24 AM

## 2023-10-10 NOTE — Progress Notes (Signed)
D- Patient alert and oriented x4, affect/mood. Seems bright and superficial, he denied SI, HI, plan or intent, did admit to  AVH, and without pain. Patient declined to attended scheduled groups and minimum interactions noted with peers. A- Scheduled medications administered to patient, per MD orders. Support and encouragement provided.  Routine safety checks conducted every 15 minutes.  Patient informed to notify staff with problems or concerns. R- No adverse drug reactions noted. Patient contracts for safety at this time. Patient compliant with medications and treatment plan. Patient receptive, calm, and cooperative. Patient interacts well with others on the unit.  Patient remains safe at this time.

## 2023-10-10 NOTE — Plan of Care (Signed)
  Problem: Education: Goal: Knowledge of [facility] Education information/materials will improve Outcome: Progressing Goal: Mental status will improve Outcome: Progressing Goal: Verbalization of understanding the information provided will improve Outcome: Progressing   Problem: Activity: Goal: Sleeping patterns will improve Outcome: Progressing   Problem: Coping: Goal: Ability to verbalize frustrations and anger appropriately will improve Outcome: Progressing   Problem: Education: Goal: Emotional status will improve Outcome: Not Progressing   Problem: Activity: Goal: Interest or engagement in activities will improve Outcome: Not Progressing

## 2023-10-10 NOTE — Plan of Care (Signed)
Pt denies SI/HI/AVH, compliant with medication administration per MD orders  Problem: Education: Goal: Knowledge of [facility] Education information/materials will improve Outcome: Progressing Goal: Emotional status will improve Outcome: Progressing Goal: Mental status will improve Outcome: Progressing Goal: Verbalization of understanding the information provided will improve Outcome: Progressing   Problem: Activity: Goal: Interest or engagement in activities will improve Outcome: Progressing Goal: Sleeping patterns will improve Outcome: Progressing   Problem: Coping: Goal: Ability to verbalize frustrations and anger appropriately will improve Outcome: Progressing Goal: Ability to demonstrate self-control will improve Outcome: Progressing   Problem: Health Behavior/Discharge Planning: Goal: Identification of resources available to assist in meeting health care needs will improve Outcome: Progressing Goal: Compliance with treatment plan for underlying cause of condition will improve Outcome: Progressing   Problem: Physical Regulation: Goal: Ability to maintain clinical measurements within normal limits will improve Outcome: Progressing   Problem: Safety: Goal: Periods of time without injury will increase Outcome: Progressing   Problem: Education: Goal: Knowledge of [facility] Education information/materials will improve Outcome: Progressing Goal: Emotional status will improve Outcome: Progressing Goal: Mental status will improve Outcome: Progressing Goal: Verbalization of understanding the information provided will improve Outcome: Progressing   Problem: Activity: Goal: Interest or engagement in activities will improve Outcome: Progressing Goal: Sleeping patterns will improve Outcome: Progressing   Problem: Coping: Goal: Ability to verbalize frustrations and anger appropriately will improve Outcome: Progressing Goal: Ability to demonstrate  self-control will improve Outcome: Progressing   Problem: Health Behavior/Discharge Planning: Goal: Identification of resources available to assist in meeting health care needs will improve Outcome: Progressing Goal: Compliance with treatment plan for underlying cause of condition will improve Outcome: Progressing   Problem: Physical Regulation: Goal: Ability to maintain clinical measurements within normal limits will improve Outcome: Progressing   Problem: Safety: Goal: Periods of time without injury will increase Outcome: Progressing   Problem: Education: Goal: Utilization of techniques to improve thought processes will improve Outcome: Progressing Goal: Knowledge of the prescribed therapeutic regimen will improve Outcome: Progressing   Problem: Activity: Goal: Interest or engagement in leisure activities will improve Outcome: Progressing Goal: Imbalance in normal sleep/wake cycle will improve Outcome: Progressing   Problem: Coping: Goal: Coping ability will improve Outcome: Progressing Goal: Will verbalize feelings Outcome: Progressing   Problem: Health Behavior/Discharge Planning: Goal: Ability to make decisions will improve Outcome: Progressing Goal: Compliance with therapeutic regimen will improve Outcome: Progressing   Problem: Role Relationship: Goal: Will demonstrate positive changes in social behaviors and relationships Outcome: Progressing   Problem: Safety: Goal: Ability to disclose and discuss suicidal ideas will improve Outcome: Progressing Goal: Ability to identify and utilize support systems that promote safety will improve Outcome: Progressing   Problem: Self-Concept: Goal: Will verbalize positive feelings about self Outcome: Progressing Goal: Level of anxiety will decrease Outcome: Progressing   Problem: Activity: Goal: Will identify at least one activity in which they can participate Outcome: Progressing   Problem: Coping: Goal: Ability  to identify and develop effective coping behavior will improve Outcome: Progressing Goal: Ability to interact with others will improve Outcome: Progressing Goal: Demonstration of participation in decision-making regarding own care will improve Outcome: Progressing Goal: Ability to use eye contact when communicating with others will improve Outcome: Progressing   Problem: Health Behavior/Discharge Planning: Goal: Identification of resources available to assist in meeting health care needs will improve Outcome: Progressing   Problem: Self-Concept: Goal: Will verbalize positive feelings about self Outcome: Progressing

## 2023-10-10 NOTE — Group Note (Signed)
Date:  10/10/2023 Time:  10:23 AM  Group Topic/Focus:  Goals Group:   The focus of this group is to help patients establish daily goals to achieve during treatment and discuss how the patient can incorporate goal setting into their daily lives to aide in recovery.    Participation Level:  Did Not Attend   [facility] 10/10/2023, 10:23 AM

## 2023-10-11 DIAGNOSIS — F333 Major depressive disorder, recurrent, severe with psychotic symptoms: Secondary | ICD-10-CM | POA: Diagnosis not present

## 2023-10-11 NOTE — Plan of Care (Signed)
Patient pleasant and cooperative on approach. Patient stated that his depression is high but anxiety is ok. Patient states " lots going on. I haven't seen my son for 4 years." Patient stated that his goal is to have " peace." Patient went outside for group. Denies SI,HI and AVH at this time. Appetite and energy level good. Support and encouragement given.

## 2023-10-11 NOTE — Plan of Care (Signed)
  Problem: Education: Goal: Knowledge of [facility] Education information/materials will improve Outcome: Progressing Goal: Emotional status will improve Outcome: Progressing Goal: Mental status will improve Outcome: Progressing Goal: Verbalization of understanding the information provided will improve Outcome: Progressing   Problem: Activity: Goal: Interest or engagement in activities will improve Outcome: Progressing Goal: Sleeping patterns will improve Outcome: Progressing   Problem: Coping: Goal: Ability to verbalize frustrations and anger appropriately will improve Outcome: Progressing Goal: Ability to demonstrate self-control will improve Outcome: Progressing   Problem: Health Behavior/Discharge Planning: Goal: Identification of resources available to assist in meeting health care needs will improve Outcome: Progressing Goal: Compliance with treatment plan for underlying cause of condition will improve Outcome: Progressing   Problem: Physical Regulation: Goal: Ability to maintain clinical measurements within normal limits will improve Outcome: Progressing   Problem: Safety: Goal: Periods of time without injury will increase Outcome: Progressing   Problem: Education: Goal: Knowledge of [facility] [place] [facility] Education information/materials will improve Outcome: Progressing Goal: Emotional status will improve Outcome: Progressing Goal: Mental status will improve Outcome: Progressing Goal: Verbalization of understanding the information provided will improve Outcome: Progressing   Problem: Activity: Goal: Interest or engagement in activities will improve Outcome: Progressing Goal: Sleeping patterns will improve Outcome: Progressing   Problem: Coping: Goal: Ability to verbalize frustrations and anger appropriately will improve Outcome: Progressing Goal: Ability to demonstrate self-control will improve Outcome: Progressing   Problem: Health  Behavior/Discharge Planning: Goal: Identification of resources available to assist in meeting health care needs will improve Outcome: Progressing Goal: Compliance with treatment plan for underlying cause of condition will improve Outcome: Progressing   Problem: Physical Regulation: Goal: Ability to maintain clinical measurements within normal limits will improve Outcome: Progressing   Problem: Safety: Goal: Periods of time without injury will increase Outcome: Progressing   Problem: Education: Goal: Utilization of techniques to improve thought processes will improve Outcome: Progressing Goal: Knowledge of the prescribed therapeutic regimen will improve Outcome: Progressing   Problem: Activity: Goal: Interest or engagement in leisure activities will improve Outcome: Progressing Goal: Imbalance in normal sleep/wake cycle will improve Outcome: Progressing   Problem: Coping: Goal: Coping ability will improve Outcome: Progressing Goal: Will verbalize feelings Outcome: Progressing   Problem: Health Behavior/Discharge Planning: Goal: Ability to make decisions will improve Outcome: Progressing Goal: Compliance with therapeutic regimen will improve Outcome: Progressing   Problem: Role Relationship: Goal: Will demonstrate positive changes in social behaviors and relationships Outcome: Progressing   Problem: Safety: Goal: Ability to disclose and discuss suicidal ideas will improve Outcome: Progressing Goal: Ability to identify and utilize support systems that promote safety will improve Outcome: Progressing   Problem: Self-Concept: Goal: Will verbalize positive feelings about self Outcome: Progressing Goal: Level of anxiety will decrease Outcome: Progressing   Problem: Activity: Goal: Will identify at least one activity in which they can participate Outcome: Progressing   Problem: Coping: Goal: Ability to identify and develop effective coping behavior will  improve Outcome: Progressing Goal: Ability to interact with others will improve Outcome: Progressing Goal: Demonstration of participation in decision-making regarding own care will improve Outcome: Progressing Goal: Ability to use eye contact when communicating with others will improve Outcome: Progressing   Problem: Health Behavior/Discharge Planning: Goal: Identification of resources available to assist in meeting health care needs will improve Outcome: Progressing   Problem: Self-Concept: Goal: Will verbalize positive feelings about self Outcome: Progressing

## 2023-10-11 NOTE — Group Note (Signed)
Date:  10/11/2023 Time:  12:28 PM  Group Topic/Focus:  Developing a Wellness Toolbox:   The focus of this group is to help patients develop a "wellness toolbox" with skills and strategies to promote recovery upon discharge.    Participation Level:  Active  Participation Quality:  Appropriate  Affect:  Appropriate  Cognitive:  Appropriate  Insight: Appropriate  Engagement in Group:  Engaged  Modes of Intervention:  Activity  Additional Comments:    Jeremiah Newman 10/11/2023, 12:28 PM

## 2023-10-11 NOTE — Group Note (Signed)
Recreation Therapy Group Note   Group Topic:Leisure Education  Group Date: 10/11/2023 Start Time: 1000 End Time: 1100 Facilitators: Rosina Lowenstein, LRT, CTRS Location:  Craft Room  Group Description: Leisure. Patients were given the option to choose from singing karaoke, coloring mandalas, using oil pastels, journaling, or playing with play-doh. LRT and pts discussed the meaning of leisure, the importance of participating in leisure during their free time/when they're outside of the hospital, as well as how our leisure interests can also serve as coping skills.   Goal Area(s) Addressed:  Patient will identify a current leisure interest.  Patient will learn the definition of "leisure". Patient will practice making a positive decision. Patient will have the opportunity to try a new leisure activity. Patient will communicate with peers and LRT.    Affect/Mood: Appropriate   Participation Level: Active and Engaged   Participation Quality: Independent   Behavior: Calm and Cooperative   Speech/Thought Process: Coherent   Insight: Good   Judgement: Good   Modes of Intervention: Activity   Patient Response to Interventions:  Attentive, Engaged, and Interested    Education Outcome:  Acknowledges education   Clinical Observations/Individualized Feedback: Jimel was active in their participation of session activities and group discussion. 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 Pt chose to draw with oil pastels while in group. Pt interacted well with LRT and peers duration of session.    Plan: Continue to engage patient in RT group sessions 2-3x/week.   Rosina Lowenstein, LRT, CTRS 10/11/2023 12:03 PM

## 2023-10-11 NOTE — Group Note (Signed)
Date:  10/11/2023 Time:  12:55 PM  Group Topic/Focus:  Coping With Mental Health Crisis:   The purpose of this group is to help patients identify strategies for coping with mental health crisis.  Group discusses possible causes of crisis and ways to manage them effectively. Goals Group:   The focus of this group is to help patients establish daily goals to achieve during treatment and discuss how the patient can incorporate goal setting into their daily lives to aide in recovery.    Participation Level:  Active  Participation Quality:  Appropriate  Affect:  Appropriate  Cognitive:  Appropriate  Insight: Appropriate  Engagement in Group:  Engaged  Modes of Intervention:  Discussion  Additional Comments:    Ophelia Shoulder 10/11/2023, 12:55 PM

## 2023-10-11 NOTE — Progress Notes (Signed)
[facility] MD Progress Note  10/11/2023 7:27 AM Jeremiah Newman  MRN:  [id number]  Subjective:  Notes, labs, and vital signs reviewed. Client is in the group room mingling with others. Client reports "I am ok. My depression is pretty high. I am anxious about being out in the real world again when I leave here. My Mom/ Dad drink almost every night and when they do I get these urges to go do things I shouldn't. I mean I have friends I can call to get out of the house, but it is hard." Client reports mild cravings for substances and alcohol but does not specify which substances. Reports sleep is fair. Appetite is good "The food is a lot better here than what I have been eating for the past years." Denies suicidal and homicidal ideations. Reports hearing negative self-talk frequently, denies visual hallucinations and paranoia. No medication side effects   Principal Problem: Major depressive disorder, recurrent episode, severe, with psychosis (HCC) Diagnosis: Principal Problem:   Major depressive disorder, recurrent episode, severe, with psychosis (HCC) Active Problems:   PTSD (post-traumatic stress disorder)   Insomnia  Total Time spent with patient: 30 minutes  Past Psychiatric History: depression, anxiety, substance abuse  Past Medical History: History reviewed. No pertinent past medical history. History reviewed. No pertinent surgical history. Family History: History reviewed. No pertinent family history. Family Psychiatric  History: mother with alcohol use disorder Social History:  Social History   Substance and Sexual Activity  Alcohol Use Yes     Social History   Substance and Sexual Activity  Drug Use No    Social History   Socioeconomic History   Marital status: Single    Spouse name: Not on file   Number of children: Not on file   Years of education: Not on file   Highest education level: Not on file  Occupational History   Not on file  Tobacco Use   Smoking status: Some  Days    Current packs/day: 1.00    Average packs/day: 1 pack/day for 8.9 years (8.9 ttl pk-yrs)    Types: Cigarettes    Start date: 2016   Smokeless tobacco: Never  Substance and Sexual Activity   Alcohol use: Yes   Drug use: No   Sexual activity: Not Currently  Other Topics Concern   Not on file  Social History Narrative   Not on file   Social Determinants of Health   Financial Resource Strain: Not on file  Food Insecurity: No Food Insecurity (10/09/2023)   Hunger Vital Sign    Worried About Running Out of Food in the Last Year: Never true    Ran Out of Food in the Last Year: Never true  Transportation Needs: No Transportation Needs (10/09/2023)   PRAPARE - Administrator, Civil Service (Medical): No    Lack of Transportation (Non-Medical): No  Physical Activity: Not on file  Stress: Not on file  Social Connections: Not on file   Additional Social History: lives with his parents    Sleep: Good  Appetite:  Good  Current Medications: Current Facility-Administered Medications  Medication Dose Route Frequency Provider Last Rate Last Admin   acetaminophen (TYLENOL) tablet 650 mg  650 mg Oral Q6H PRN Dixon, [name], NP       alum & mag hydroxide-simeth (MAALOX/MYLANTA) 200-200-20 MG/5ML suspension 30 mL  30 mL Oral Q4H PRN Dixon, [name], NP       diphenhydrAMINE (BENADRYL) capsule 50 mg  50  mg Oral TID PRN [name], NP       Or   diphenhydrAMINE (BENADRYL) injection 50 mg  50 mg Intramuscular TID PRN Durwin Nora, [name], NP       escitalopram (LEXAPRO) tablet 10 mg  10 mg Oral Daily Charm Rings, NP   10 mg at 10/10/23 9811   gabapentin (NEURONTIN) capsule 200 mg  200 mg Oral TID Charm Rings, NP   200 mg at 10/10/23 1715   haloperidol (HALDOL) tablet 5 mg  5 mg Oral TID PRN [name], NP       Or   haloperidol lactate (HALDOL) injection 5 mg  5 mg Intramuscular TID PRN [name], NP       hydrOXYzine (ATARAX) tablet 25 mg  25 mg  Oral TID PRN [name], NP       LORazepam (ATIVAN) tablet 2 mg  2 mg Oral TID PRN [name], NP       Or   LORazepam (ATIVAN) injection 2 mg  2 mg Intramuscular TID PRN [name], NP       magnesium hydroxide (MILK OF MAGNESIA) suspension 30 mL  30 mL Oral Daily PRN [name], NP       QUEtiapine (SEROQUEL) tablet 100 mg  100 mg Oral QHS Charm Rings, NP   100 mg at 10/10/23 2113   traZODone (DESYREL) tablet 100 mg  100 mg Oral QHS PRN Charm Rings, NP   100 mg at 10/10/23 2113    Lab Results:  Results for orders placed or performed during the hospital encounter of 10/09/23 (from the past 48 hour(s))  Lipid panel     Status: Abnormal   Collection Time: 10/10/23  6:54 AM  Result Value Ref Range   Cholesterol 214 (H) 0 - 200 mg/dL   Triglycerides 914 <782 mg/dL   HDL 41 >95 mg/dL   Total CHOL/HDL Ratio 5.2 RATIO   VLDL 27 0 - 40 mg/dL   LDL Cholesterol 621 (H) 0 - 99 mg/dL    Comment:        Total Cholesterol/HDL:CHD Risk Coronary Heart Disease Risk Table                     Men   Women  1/2 Average Risk   3.4   3.3  Average Risk       5.0   4.4  2 X Average Risk   9.6   7.1  3 X Average Risk  23.4   11.0        Use the calculated Patient Ratio above and the CHD Risk Table to determine the patient's CHD Risk.        ATP III CLASSIFICATION (LDL):  <100     mg/dL   Optimal  308-657  mg/dL   Near or Above                    Optimal  130-159  mg/dL   Borderline  846-962  mg/dL   High  >952     mg/dL   Very High Performed at [facility], [address] [place], [place] [address]   Hemoglobin A1c     Status: None   Collection Time: 10/10/23  6:54 AM  Result Value Ref Range   Hgb A1c MFr Bld 4.9 4.8 - 5.6 %    Comment: (NOTE) Pre diabetes:  5.7%-6.4%  Diabetes:              >6.4%  Glycemic control for   <7.0% adults with diabetes    Mean Plasma Glucose 93.93 mg/dL    Comment: Performed at [facility],  [address], [place], [place] [address]  TSH     Status: None   Collection Time: 10/10/23  6:54 AM  Result Value Ref Range   TSH 0.925 0.350 - 4.500 uIU/mL    Comment: Performed by a 3rd Generation assay with a functional sensitivity of <=0.01 uIU/mL. Performed at [facility], [address] [place], [place] [address]     Blood Alcohol level:  Lab Results  Component Value Date   ETH <10 10/08/2023   ETH 215 (H) 04/22/2017    Metabolic Disorder Labs: Lab Results  Component Value Date   HGBA1C 4.9 10/10/2023   MPG 93.93 10/10/2023   No results found for: "PROLACTIN" Lab Results  Component Value Date   CHOL 214 (H) 10/10/2023   TRIG 135 10/10/2023   HDL 41 10/10/2023   CHOLHDL 5.2 10/10/2023   VLDL 27 10/10/2023   LDLCALC 146 (H) 10/10/2023     Musculoskeletal: Strength & Muscle Tone: within normal limits Gait & Station: normal Patient leans: N/A  Psychiatric Specialty Exam: Physical Exam Vitals and nursing note reviewed.  Constitutional:      Appearance: Normal appearance.  HENT:     Head: Normocephalic.     Nose: Nose normal.  Pulmonary:     Effort: Pulmonary effort is normal.  Musculoskeletal:        General: Normal range of motion.     Cervical back: Normal range of motion.  Neurological:     General: No focal deficit present.     Mental Status: He is alert and oriented to person, place, and time.    Review of Systems  Psychiatric/Behavioral:  Positive for depression. The patient is nervous/anxious.   All other systems reviewed and are negative.   Blood pressure (!) 98/53, pulse 67, temperature 97.7 F (36.5 C), temperature source Oral, resp. rate 18, height 5\' 8"  (1.727 m), weight 79.4 kg, SpO2 100%.Body mass index is 26.61 kg/m.  General Appearance: Fairly Groomed  Eye Contact:  Good  Speech:  Normal Rate  Volume:  Normal  Mood:  Anxious and Depressed  Affect:  Depressed and Flat  Thought Process:  Coherent  Orientation:  Full (Time,  Place, and Person)  Thought Content:  WDL  Suicidal Thoughts:  No  Homicidal Thoughts:  No  Memory:  Immediate;   Fair Recent;   Fair Remote;   Fair  Judgement:  Good  Insight:  Fair  Psychomotor Activity:  Normal  Concentration:  Concentration: Fair and Attention Span: Fair  Recall:  Good  Fund of Knowledge:  Fair  Language:  Good  Akathisia:  NA  Handed:  Right  AIMS (if indicated):     Assets:  Communication Skills Desire for Improvement Resilience  ADL's:  Intact  Cognition:  WNL  Sleep:  Good     Physical Exam: Physical Exam Vitals and nursing note reviewed.  Constitutional:      Appearance: Normal appearance.  HENT:     Head: Normocephalic.     Nose: Nose normal.  Pulmonary:     Effort: Pulmonary effort is normal.  Musculoskeletal:        General: Normal range of motion.     Cervical back: Normal range of motion.  Neurological:     General: No focal deficit present.     Mental Status: He is alert and oriented to person, place, and time.   Review of Systems  Psychiatric/Behavioral:  Positive for depression. The patient is nervous/anxious.   All other systems reviewed and are negative.  Blood pressure (!) 98/53, pulse 67, temperature 97.7 F (36.5 C), temperature source Oral, resp. rate 18, height 5\' 8"  (1.727 m), weight 79.4 kg, SpO2 100%. Body mass index is 26.61 kg/m.   Treatment Plan Summary: Daily contact with patient to assess and evaluate symptoms and progress in treatment, Medication management, and Plan : Major depressive disorder, recurrent, severe with psychosis: Lexapro 10 mg daily   General anxiety disorder: Gabapentin 200 mg TID increased to 300 mg TID   Insomnia: Seroquel 100 mg daily at bedtime increased to 150 mg--lipid panel with elevated cholesterol of 214 and LDLs of 146, EKG & A1C Pending, and TSH 0.925 Trazodone 100 mg daily at bedtime PRN   Nanine Means, NP 10/11/2023, 7:27 AM

## 2023-10-11 NOTE — Group Note (Signed)
Date:  10/11/2023 Time:  6:14 PM  Group Topic/Focus:  Making Healthy Choices:   The focus of this group is to help patients identify negative/unhealthy choices they were using prior to admission and identify positive/healthier coping strategies to replace them upon discharge.    Participation Level:  Active  Participation Quality:  Appropriate  Affect:  Appropriate  Cognitive:  Appropriate  Insight: Appropriate  Engagement in Group:  Engaged  Modes of Intervention:  Activity, Discussion, and Socialization  Additional Comments:    Wilford Corner 10/11/2023, 6:14 PM

## 2023-10-11 NOTE — Group Note (Unsigned)
Date:  10/11/2023 Time:  10:32 PM  Group Topic/Focus:  Wrap-Up Group:   The focus of this group is to help patients review their daily goal of treatment and discuss progress on daily workbooks.     Participation Level:  {BHH PARTICIPATION XBJYN:82956}  Participation Quality:  {BHH PARTICIPATION QUALITY:22265}  Affect:  {BHH AFFECT:22266}  Cognitive:  {BHH COGNITIVE:22267}  Insight: {BHH Insight2:20797}  Engagement in Group:  {BHH ENGAGEMENT IN OZHYQ:65784}  Modes of Intervention:  {BHH MODES OF INTERVENTION:22269}  Additional Comments:  ***  Lenore Cordia 10/11/2023, 10:32 PM

## 2023-10-11 NOTE — Group Note (Signed)
Date:  10/11/2023 Time:  10:52 PM  Group Topic/Focus:  Wrap-Up Group:   The focus of this group is to help patients review their daily goal of treatment and discuss progress on daily workbooks.    Participation Level:  Active  Participation Quality:  Appropriate  Affect:  Appropriate  Cognitive:  Appropriate  Insight: Appropriate  Engagement in Group:  Engaged  Modes of Intervention:  Discussion   Lenore Cordia 10/11/2023, 10:52 PM

## 2023-10-11 NOTE — Group Note (Unsigned)
Date:  10/11/2023 Time:  11:23 PM  Group Topic/Focus:  Wrap-Up Group:   The focus of this group is to help patients review their daily goal of treatment and discuss progress on daily workbooks.     Participation Level:  {BHH PARTICIPATION OZHYQ:65784}  Participation Quality:  {BHH PARTICIPATION QUALITY:22265}  Affect:  {BHH AFFECT:22266}  Cognitive:  {BHH COGNITIVE:22267}  Insight: {BHH Insight2:20797}  Engagement in Group:  {BHH ENGAGEMENT IN ONGEX:52841}  Modes of Intervention:  {BHH MODES OF INTERVENTION:22269}  Additional Comments:  ***  Lenore Cordia 10/11/2023, 11:23 PM

## 2023-10-12 DIAGNOSIS — F333 Major depressive disorder, recurrent, severe with psychotic symptoms: Secondary | ICD-10-CM | POA: Diagnosis not present

## 2023-10-12 MED ORDER — QUETIAPINE FUMARATE 100 MG PO TABS
150.0000 mg | ORAL_TABLET | Freq: Every day | ORAL | Status: DC
  Administered 2023-10-12 – 2023-10-15 (×4): 150 mg via ORAL
  Filled 2023-10-12 (×4): qty 2

## 2023-10-12 MED ORDER — MIRTAZAPINE 15 MG PO TABS
7.5000 mg | ORAL_TABLET | Freq: Every day | ORAL | Status: DC
  Administered 2023-10-13 – 2023-10-18 (×6): 7.5 mg via ORAL
  Filled 2023-10-12 (×7): qty 1

## 2023-10-12 MED ORDER — GABAPENTIN 300 MG PO CAPS
300.0000 mg | ORAL_CAPSULE | Freq: Three times a day (TID) | ORAL | Status: DC
  Administered 2023-10-12 – 2023-10-23 (×34): 300 mg via ORAL
  Filled 2023-10-12 (×34): qty 1

## 2023-10-12 NOTE — Progress Notes (Signed)
D- Patient alert and oriented x 4. Affect flat/mood congruent. Denies SI/ HI/ AVH. Patient denies pain. Patient endorses depression and anxiety. His goal today is "peace". He states he has anxiety and is concerned about transitioning "back home, my family drinks a lot of alcohol". He states he doesn't want to "be around it". He wants to go to a structured rehabilitation to get back in the community and get a job and a house. A- Scheduled medications administered to patient, per MD orders. Support and encouragement provided.  Routine safety checks conducted every 15 minutes without incident.  Patient informed to notify staff with problems or concerns and verbalizes understanding. R- No adverse drug reactions noted.  Patient compliant with medications and treatment plan. Patient receptive, calm and cooperative. He interacts well with others on the unit.  Patient contracts for safety and  remains safe on the unit at this time.

## 2023-10-12 NOTE — Plan of Care (Signed)
Problem: Education: Goal: Knowledge of [facility] Education information/materials will improve Outcome: Not Progressing Goal: Emotional status will improve Outcome: Not Progressing Goal: Mental status will improve Outcome: Not Progressing Goal: Verbalization of understanding the information provided will improve Outcome: Not Progressing   Problem: Activity: Goal: Interest or engagement in activities will improve Outcome: Not Progressing Goal: Sleeping patterns will improve Outcome: Not Progressing   Problem: Coping: Goal: Ability to verbalize frustrations and anger appropriately will improve Outcome: Not Progressing Goal: Ability to demonstrate self-control will improve Outcome: Not Progressing   Problem: Health Behavior/Discharge Planning: Goal: Identification of resources available to assist in meeting health care needs will improve Outcome: Not Progressing Goal: Compliance with treatment plan for underlying cause of condition will improve Outcome: Not Progressing   Problem: Physical Regulation: Goal: Ability to maintain clinical measurements within normal limits will improve Outcome: Not Progressing   Problem: Safety: Goal: Periods of time without injury will increase Outcome: Not Progressing   Problem: Education: Goal: Knowledge of  General Education information/materials will improve Outcome: Not Progressing Goal: Emotional status will improve Outcome: Not Progressing Goal: Mental status will improve Outcome: Not Progressing Goal: Verbalization of understanding the information provided will improve Outcome: Not Progressing   Problem: Activity: Goal: Interest or engagement in activities will improve Outcome: Not Progressing Goal: Sleeping patterns will improve Outcome: Not Progressing   Problem: Coping: Goal: Ability to verbalize frustrations and anger appropriately will improve Outcome: Not Progressing Goal: Ability to demonstrate  self-control will improve Outcome: Not Progressing   Problem: Health Behavior/Discharge Planning: Goal: Identification of resources available to assist in meeting health care needs will improve Outcome: Not Progressing Goal: Compliance with treatment plan for underlying cause of condition will improve Outcome: Not Progressing   Problem: Physical Regulation: Goal: Ability to maintain clinical measurements within normal limits will improve Outcome: Not Progressing   Problem: Safety: Goal: Periods of time without injury will increase Outcome: Not Progressing   Problem: Education: Goal: Utilization of techniques to improve thought processes will improve Outcome: Not Progressing Goal: Knowledge of the prescribed therapeutic regimen will improve Outcome: Not Progressing   Problem: Activity: Goal: Interest or engagement in leisure activities will improve Outcome: Not Progressing Goal: Imbalance in normal sleep/wake cycle will improve Outcome: Not Progressing   Problem: Coping: Goal: Coping ability will improve Outcome: Not Progressing Goal: Will verbalize feelings Outcome: Not Progressing   Problem: Health Behavior/Discharge Planning: Goal: Ability to make decisions will improve Outcome: Not Progressing Goal: Compliance with therapeutic regimen will improve Outcome: Not Progressing   Problem: Role Relationship: Goal: Will demonstrate positive changes in social behaviors and relationships Outcome: Not Progressing   Problem: Safety: Goal: Ability to disclose and discuss suicidal ideas will improve Outcome: Not Progressing Goal: Ability to identify and utilize support systems that promote safety will improve Outcome: Not Progressing   Problem: Self-Concept: Goal: Will verbalize positive feelings about self Outcome: Not Progressing Goal: Level of anxiety will decrease Outcome: Not Progressing   Problem: Activity: Goal: Will identify at least one activity in which they  can participate Outcome: Not Progressing   Problem: Coping: Goal: Ability to identify and develop effective coping behavior will improve Outcome: Not Progressing Goal: Ability to interact with others will improve Outcome: Not Progressing Goal: Demonstration of participation in decision-making regarding own care will improve Outcome: Not Progressing Goal: Ability to use eye contact when communicating with others will improve Outcome: Not Progressing   Problem: Health Behavior/Discharge Planning: Goal: Identification of resources available to assist in  meeting health care needs will improve Outcome: Not Progressing   Problem: Self-Concept: Goal: Will verbalize positive feelings about self Outcome: Not Progressing

## 2023-10-12 NOTE — Group Note (Signed)
Date:  10/12/2023 Time:  3:53 PM  Group Topic/Focus:  Self Care:   The focus of this group is to help patients understand the importance of self-care in order to improve or restore emotional, physical, spiritual, interpersonal, and financial health.    Participation Level:  Active  Participation Quality:  Appropriate  Affect:  Appropriate  Cognitive:  Appropriate  Insight: Appropriate  Engagement in Group:  Engaged  Modes of Intervention:  Activity  Additional Comments:    Kyla Duffy 10/12/2023, 3:53 PM

## 2023-10-12 NOTE — Plan of Care (Signed)
  Problem: Education: Goal: Knowledge of [facility] Education information/materials will improve Outcome: Progressing Goal: Emotional status will improve Outcome: Progressing Goal: Mental status will improve Outcome: Progressing Goal: Verbalization of understanding the information provided will improve Outcome: Progressing   Problem: Activity: Goal: Interest or engagement in activities will improve Outcome: Progressing Goal: Sleeping patterns will improve Outcome: Progressing   Problem: Coping: Goal: Ability to verbalize frustrations and anger appropriately will improve Outcome: Progressing Goal: Ability to demonstrate self-control will improve Outcome: Progressing   Problem: Health Behavior/Discharge Planning: Goal: Identification of resources available to assist in meeting health care needs will improve Outcome: Progressing Goal: Compliance with treatment plan for underlying cause of condition will improve Outcome: Progressing   Problem: Physical Regulation: Goal: Ability to maintain clinical measurements within normal limits will improve Outcome: Progressing   Problem: Safety: Goal: Periods of time without injury will increase Outcome: Progressing   Problem: Education: Goal: Knowledge of [facility] [place] [facility] Education information/materials will improve Outcome: Progressing Goal: Emotional status will improve Outcome: Progressing Goal: Mental status will improve Outcome: Progressing Goal: Verbalization of understanding the information provided will improve Outcome: Progressing   Problem: Activity: Goal: Interest or engagement in activities will improve Outcome: Progressing Goal: Sleeping patterns will improve Outcome: Progressing   Problem: Coping: Goal: Ability to verbalize frustrations and anger appropriately will improve Outcome: Progressing Goal: Ability to demonstrate self-control will improve Outcome: Progressing   Problem: Health  Behavior/Discharge Planning: Goal: Identification of resources available to assist in meeting health care needs will improve Outcome: Progressing Goal: Compliance with treatment plan for underlying cause of condition will improve Outcome: Progressing   Problem: Physical Regulation: Goal: Ability to maintain clinical measurements within normal limits will improve Outcome: Progressing   Problem: Safety: Goal: Periods of time without injury will increase Outcome: Progressing   Problem: Education: Goal: Utilization of techniques to improve thought processes will improve Outcome: Progressing Goal: Knowledge of the prescribed therapeutic regimen will improve Outcome: Progressing   Problem: Activity: Goal: Interest or engagement in leisure activities will improve Outcome: Progressing Goal: Imbalance in normal sleep/wake cycle will improve Outcome: Progressing   Problem: Coping: Goal: Coping ability will improve Outcome: Progressing Goal: Will verbalize feelings Outcome: Progressing   Problem: Health Behavior/Discharge Planning: Goal: Ability to make decisions will improve Outcome: Progressing Goal: Compliance with therapeutic regimen will improve Outcome: Progressing   Problem: Role Relationship: Goal: Will demonstrate positive changes in social behaviors and relationships Outcome: Progressing   Problem: Safety: Goal: Ability to disclose and discuss suicidal ideas will improve Outcome: Progressing Goal: Ability to identify and utilize support systems that promote safety will improve Outcome: Progressing   Problem: Self-Concept: Goal: Will verbalize positive feelings about self Outcome: Progressing Goal: Level of anxiety will decrease Outcome: Progressing   Problem: Activity: Goal: Will identify at least one activity in which they can participate Outcome: Progressing   Problem: Coping: Goal: Ability to identify and develop effective coping behavior will  improve Outcome: Progressing Goal: Ability to interact with others will improve Outcome: Progressing Goal: Demonstration of participation in decision-making regarding own care will improve Outcome: Progressing Goal: Ability to use eye contact when communicating with others will improve Outcome: Progressing   Problem: Health Behavior/Discharge Planning: Goal: Identification of resources available to assist in meeting health care needs will improve Outcome: Progressing   Problem: Self-Concept: Goal: Will verbalize positive feelings about self Outcome: Progressing

## 2023-10-12 NOTE — Progress Notes (Signed)
[facility] MD Progress Note  10/12/2023 7:22 AM Jeremiah Newman  MRN:  [id number]  Subjective:  Notes, labs, and vital signs reviewed. Client is in the group room mingling with others. Client reports "I was a little shaky this morning from my anxiety but it is getting better". Reports anxiety 4/10, denies panic attacks. Per client depression is high when he is alone, when around others he can get his mind off of it and his depression lowers. Appetite remains fair, "I eat because I have to". Sleep reported as poor due to anxiety and racing thoughts about what life looks like after discharge. Client reports craving a connection and when that is not fulfilled he craves drugs/ alcohol. Denies suicidal ideation but reports having self destructive behavior thoughts such as doing drugs or drinking. Denies medication side effects, homicidal ideation, paranoia and hallucinations. Reports when his depression/ anxiety is heightened, that is when he typically sees "bad faces" pop up around him. Reports that has not occurred since he has been admitted to the unit.   Principal Problem: Major depressive disorder, recurrent episode, severe, with psychosis (HCC) Diagnosis: Principal Problem:   Major depressive disorder, recurrent episode, severe, with psychosis (HCC) Active Problems:   PTSD (post-traumatic stress disorder)   Insomnia  Total Time spent with patient: 30 minutes  Past Psychiatric History: depression, anxiety, substance abuse  Past Medical History: History reviewed. No pertinent past medical history. History reviewed. No pertinent surgical history. Family History: History reviewed. No pertinent family history. Family Psychiatric  History: mother with alcohol use disorder Social History:  Social History   Substance and Sexual Activity  Alcohol Use Yes     Social History   Substance and Sexual Activity  Drug Use No    Social History   Socioeconomic History   Marital status: Single    Spouse  name: Not on file   Number of children: Not on file   Years of education: Not on file   Highest education level: Not on file  Occupational History   Not on file  Tobacco Use   Smoking status: Some Days    Current packs/day: 1.00    Average packs/day: 1 pack/day for 8.9 years (8.9 ttl pk-yrs)    Types: Cigarettes    Start date: 2016   Smokeless tobacco: Never  Substance and Sexual Activity   Alcohol use: Yes   Drug use: No   Sexual activity: Not Currently  Other Topics Concern   Not on file  Social History Narrative   Not on file   Social Determinants of Health   Financial Resource Strain: Not on file  Food Insecurity: No Food Insecurity (10/09/2023)   Hunger Vital Sign    Worried About Running Out of Food in the Last Year: Never true    Ran Out of Food in the Last Year: Never true  Transportation Needs: No Transportation Needs (10/09/2023)   PRAPARE - Administrator, Civil Service (Medical): No    Lack of Transportation (Non-Medical): No  Physical Activity: Not on file  Stress: Not on file  Social Connections: Not on file   Additional Social History: lives with his parents    Sleep: Good  Appetite:  Good  Current Medications: Current Facility-Administered Medications  Medication Dose Route Frequency Provider Last Rate Last Admin   acetaminophen (TYLENOL) tablet 650 mg  650 mg Oral Q6H PRN [name], NP       alum & mag hydroxide-simeth (MAALOX/MYLANTA) 200-200-20 MG/5ML suspension 30  mL  30 mL Oral Q4H PRN [name], NP       diphenhydrAMINE (BENADRYL) capsule 50 mg  50 mg Oral TID PRN [name], NP       Or   diphenhydrAMINE (BENADRYL) injection 50 mg  50 mg Intramuscular TID PRN [name], NP       escitalopram (LEXAPRO) tablet 10 mg  10 mg Oral Daily Charm Rings, NP   10 mg at 10/11/23 1914   gabapentin (NEURONTIN) capsule 200 mg  200 mg Oral TID Charm Rings, NP   200 mg at 10/11/23 1708   haloperidol (HALDOL)  tablet 5 mg  5 mg Oral TID PRN [name], NP       Or   haloperidol lactate (HALDOL) injection 5 mg  5 mg Intramuscular TID PRN [name], NP       hydrOXYzine (ATARAX) tablet 25 mg  25 mg Oral TID PRN [name], NP       LORazepam (ATIVAN) tablet 2 mg  2 mg Oral TID PRN [name], NP       Or   LORazepam (ATIVAN) injection 2 mg  2 mg Intramuscular TID PRN [name], NP       magnesium hydroxide (MILK OF MAGNESIA) suspension 30 mL  30 mL Oral Daily PRN [name], NP       QUEtiapine (SEROQUEL) tablet 100 mg  100 mg Oral QHS Charm Rings, NP   100 mg at 10/11/23 2130   traZODone (DESYREL) tablet 100 mg  100 mg Oral QHS PRN Charm Rings, NP   100 mg at 10/11/23 2130    Lab Results:  No results found for this or any previous visit (from the past 48 hour(s)).   Blood Alcohol level:  Lab Results  Component Value Date   ETH <10 10/08/2023   ETH 215 (H) 04/22/2017    Metabolic Disorder Labs: Lab Results  Component Value Date   HGBA1C 4.9 10/10/2023   MPG 93.93 10/10/2023   No results found for: "PROLACTIN" Lab Results  Component Value Date   CHOL 214 (H) 10/10/2023   TRIG 135 10/10/2023   HDL 41 10/10/2023   CHOLHDL 5.2 10/10/2023   VLDL 27 10/10/2023   LDLCALC 146 (H) 10/10/2023     Musculoskeletal: Strength & Muscle Tone: within normal limits Gait & Station: normal Patient leans: N/A  Psychiatric Specialty Exam: Physical Exam Vitals and nursing note reviewed.  Constitutional:      Appearance: Normal appearance.  HENT:     Head: Normocephalic.     Nose: Nose normal.  Pulmonary:     Effort: Pulmonary effort is normal.  Musculoskeletal:        General: Normal range of motion.     Cervical back: Normal range of motion.  Neurological:     General: No focal deficit present.     Mental Status: He is alert and oriented to person, place, and time.     Review of Systems  Psychiatric/Behavioral:  Positive for depression.  The patient is nervous/anxious.   All other systems reviewed and are negative.   Blood pressure 112/71, pulse 61, temperature (!) 97.3 F (36.3 C), resp. rate 18, height 5\' 8"  (1.727 m), weight 79.4 kg, SpO2 99%.Body mass index is 26.61 kg/m.  General Appearance: Fairly Groomed  Eye Contact:  Good  Speech:  Normal Rate  Volume:  Normal  Mood:  Anxious and Depressed  Affect:  Depressed  Thought Process:  Coherent  Orientation:  Full (Time, Place, and Person)  Thought Content:  WDL  Suicidal Thoughts:  No  Homicidal Thoughts:  No  Memory:  Immediate;   Good Recent;   Good Remote;   Good  Judgement:  Fair  Insight:  Fair  Psychomotor Activity:  Normal  Concentration:  Concentration: Fair and Attention Span: Fair  Recall:  Good  Fund of Knowledge:  Fair  Language:  Good  Akathisia:  NA  Handed:  Right  AIMS (if indicated):     Assets:  Communication Skills Desire for Improvement Resilience  ADL's:  Intact  Cognition:  WNL  Sleep:  Poor    Physical Exam: Physical Exam Vitals and nursing note reviewed.  Constitutional:      Appearance: Normal appearance.  HENT:     Head: Normocephalic.     Nose: Nose normal.  Pulmonary:     Effort: Pulmonary effort is normal.  Musculoskeletal:        General: Normal range of motion.     Cervical back: Normal range of motion.  Neurological:     General: No focal deficit present.     Mental Status: He is alert and oriented to person, place, and time.    Review of Systems  Psychiatric/Behavioral:  Positive for depression. The patient is nervous/anxious.   All other systems reviewed and are negative.  Blood pressure 112/71, pulse 61, temperature (!) 97.3 F (36.3 C), resp. rate 18, height 5\' 8"  (1.727 m), weight 79.4 kg, SpO2 99%. Body mass index is 26.61 kg/m.   Treatment Plan Summary: Daily contact with patient to assess and evaluate symptoms and progress in treatment, Medication management, and Plan :  Major depressive  disorder, recurrent, severe with psychosis: Lexapro 10 mg daily   General anxiety disorder: Gabapentin 200 mg TID increased to 300 mg TID   Insomnia: Seroquel 100 mg daily at bedtime increased to 150 mg--lipid panel with elevated cholesterol of 214 and LDLs of 146, EKG & A1C Pending, and TSH 0.925 Trazodone 100 mg daily at bedtime PRN Remeron 7.5 mg daily at bedtime started   Nanine Means, NP 10/12/2023, 7:22 AM

## 2023-10-12 NOTE — Group Note (Signed)
Date:  10/12/2023 Time:  8:59 PM  Group Topic/Focus:  Wrap-Up Group:   The focus of this group is to help patients review their daily goal of treatment and discuss progress on daily workbooks.    Participation Level:  Active  Participation Quality:  Appropriate, Attentive, Sharing, and Supportive  Affect:  Appropriate  Cognitive:  Appropriate  Insight: Appropriate and Good  Engagement in Group:  Engaged and Supportive  Modes of Intervention:  Discussion and Support  Additional Comments:     Jeremiah Newman 10/12/2023, 8:59 PM

## 2023-10-13 DIAGNOSIS — F333 Major depressive disorder, recurrent, severe with psychotic symptoms: Secondary | ICD-10-CM | POA: Diagnosis not present

## 2023-10-13 MED ORDER — HYDROXYZINE HCL 25 MG PO TABS
25.0000 mg | ORAL_TABLET | Freq: Four times a day (QID) | ORAL | Status: DC | PRN
  Administered 2023-10-14 – 2023-10-21 (×4): 25 mg via ORAL
  Filled 2023-10-13 (×6): qty 1

## 2023-10-13 MED ORDER — ESCITALOPRAM OXALATE 10 MG PO TABS
20.0000 mg | ORAL_TABLET | Freq: Every day | ORAL | Status: DC
  Administered 2023-10-14 – 2023-10-19 (×6): 20 mg via ORAL
  Filled 2023-10-13 (×6): qty 2

## 2023-10-13 NOTE — Plan of Care (Signed)
  Problem: Education: Goal: Emotional status will improve Outcome: Progressing   Problem: Education: Goal: Mental status will improve Outcome: Progressing   

## 2023-10-13 NOTE — Progress Notes (Signed)
[facility] MD Progress Note  10/13/2023 5:45 PM Jeremiah Newman  MRN:  [id number] Subjective:  32 year old Caucasian male.reports a desire to enter rehab, stating, "I want to go to rehab, I don't want to go back home; it is too toxic." He describes experiencing significant anxiety and acknowledges a history of PTSD. Expresses dissatisfaction with current medications, noting, "I don't want to take the trazodone; it's too much, and the Remeron makes me gain weight."Engaged and cooperative in expressing needs and treatment preferences. Principal Problem: Major depressive disorder, recurrent episode, severe, with psychosis (HCC) Diagnosis: Principal Problem:   Major depressive disorder, recurrent episode, severe, with psychosis (HCC) Active Problems:   PTSD (post-traumatic stress disorder)   Insomnia  Total Time spent with patient: 45 minutes  Past Psychiatric History: Anxiety  Past Medical History: History reviewed. No pertinent past medical history. History reviewed. No pertinent surgical history. Family History: History reviewed. No pertinent family history. Family Psychiatric  History: Mother and Father Polysubstance Abuse Depression Social History:  Social History   Substance and Sexual Activity  Alcohol Use Yes     Social History   Substance and Sexual Activity  Drug Use No    Social History   Socioeconomic History   Marital status: Single    Spouse name: Not on file   Number of children: Not on file   Years of education: Not on file   Highest education level: Not on file  Occupational History   Not on file  Tobacco Use   Smoking status: Some Days    Current packs/day: 1.00    Average packs/day: 1 pack/day for 8.9 years (8.9 ttl pk-yrs)    Types: Cigarettes    Start date: 2016   Smokeless tobacco: Never  Substance and Sexual Activity   Alcohol use: Yes   Drug use: No   Sexual activity: Not Currently  Other Topics Concern   Not on file  Social History Narrative   Not  on file   Social Determinants of Health   Financial Resource Strain: Not on file  Food Insecurity: No Food Insecurity (10/09/2023)   Hunger Vital Sign    Worried About Running Out of Food in the Last Year: Never true    Ran Out of Food in the Last Year: Never true  Transportation Needs: No Transportation Needs (10/09/2023)   PRAPARE - Administrator, Civil Service (Medical): No    Lack of Transportation (Non-Medical): No  Physical Activity: Not on file  Stress: Not on file  Social Connections: Not on file   Additional Social History:                         Sleep: Good  Appetite:  Good  Current Medications: Current Facility-Administered Medications  Medication Dose Route Frequency Provider Last Rate Last Admin   acetaminophen (TYLENOL) tablet 650 mg  650 mg Oral Q6H PRN Dixon, [name], NP       alum & mag hydroxide-simeth (MAALOX/MYLANTA) 200-200-20 MG/5ML suspension 30 mL  30 mL Oral Q4H PRN Dixon, [name], NP       diphenhydrAMINE (BENADRYL) capsule 50 mg  50 mg Oral TID PRN [name], NP       Or   diphenhydrAMINE (BENADRYL) injection 50 mg  50 mg Intramuscular TID PRN [name], NP       [START ON 10/14/2023] escitalopram (LEXAPRO) tablet 20 mg  20 mg Oral Daily [name], NP  gabapentin (NEURONTIN) capsule 300 mg  300 mg Oral TID Charm Rings, NP   300 mg at 10/13/23 1724   haloperidol (HALDOL) tablet 5 mg  5 mg Oral TID PRN [name], NP       Or   haloperidol lactate (HALDOL) injection 5 mg  5 mg Intramuscular TID PRN [name], NP       hydrOXYzine (ATARAX) tablet 25 mg  25 mg Oral Q6H PRN [name], NP       LORazepam (ATIVAN) tablet 2 mg  2 mg Oral TID PRN [name], NP       Or   LORazepam (ATIVAN) injection 2 mg  2 mg Intramuscular TID PRN [name], NP       magnesium hydroxide (MILK OF MAGNESIA) suspension 30 mL  30 mL Oral Daily PRN Dixon, [name], NP       mirtazapine (REMERON)  tablet 7.5 mg  7.5 mg Oral QHS Charm Rings, NP       QUEtiapine (SEROQUEL) tablet 150 mg  150 mg Oral QHS Charm Rings, NP   150 mg at 10/12/23 2209   traZODone (DESYREL) tablet 100 mg  100 mg Oral QHS PRN Charm Rings, NP   100 mg at 10/11/23 2130    Lab Results: No results found for this or any previous visit (from the past 48 hour(s)).  Blood Alcohol level:  Lab Results  Component Value Date   ETH <10 10/08/2023   ETH 215 (H) 04/22/2017    Metabolic Disorder Labs: Lab Results  Component Value Date   HGBA1C 4.9 10/10/2023   MPG 93.93 10/10/2023   No results found for: "PROLACTIN" Lab Results  Component Value Date   CHOL 214 (H) 10/10/2023   TRIG 135 10/10/2023   HDL 41 10/10/2023   CHOLHDL 5.2 10/10/2023   VLDL 27 10/10/2023   LDLCALC 146 (H) 10/10/2023    Physical Findings: AIMS:  , ,  ,  ,    CIWA:    COWS:     Musculoskeletal: Strength & Muscle Tone: within normal limits Gait & Station: normal Patient leans: N/A  Psychiatric Specialty Exam:  Presentation  General Appearance:  Appropriate for Environment  Eye Contact: Good  Speech: Clear and Coherent; Normal Rate  Speech Volume: Normal  Handedness: Right   Mood and Affect  Mood: Euthymic  Affect: Appropriate; Congruent   Thought Process  Thought Processes: Coherent  Descriptions of Associations:Intact  Orientation:Full (Time, Place and Person) (and situation)  Thought Content:WDL  History of Schizophrenia/Schizoaffective disorder:No  Duration of Psychotic Symptoms:N/A  Hallucinations:Hallucinations: None  Ideas of Reference:None  Suicidal Thoughts:Suicidal Thoughts: Yes, Passive SI Passive Intent and/or Plan: Without Intent; Without Plan; Without Access to Means; Without Means to Carry Out  Homicidal Thoughts:Homicidal Thoughts: No   Sensorium  Memory: Immediate Good; Remote Good  Judgment: Fair  Insight: Fair   Art therapist   Concentration: Fair  Attention Span: Good  Recall: Good  Fund of Knowledge: Good  Language: Good   Psychomotor Activity  Psychomotor Activity:Psychomotor Activity: Normal   Assets  Assets: Communication Skills; Desire for Improvement; Resilience   Sleep  Sleep:Sleep: Good Number of Hours of Sleep: 6    Physical Exam: Physical Exam Vitals and nursing note reviewed.  Constitutional:      Appearance: Normal appearance.  HENT:     Head: Normocephalic and atraumatic.     Nose: Nose normal.  Pulmonary:  Effort: Pulmonary effort is normal.  Musculoskeletal:        General: Normal range of motion.     Cervical back: Normal range of motion.  Neurological:     General: No focal deficit present.     Mental Status: He is alert and oriented to person, place, and time.  Psychiatric:        Attention and Perception: Attention and perception normal.        Mood and Affect: Affect normal. Mood is anxious.        Speech: Speech normal.        Behavior: Behavior normal. Behavior is cooperative.        Thought Content: Thought content normal.        Cognition and Memory: Cognition and memory normal.        Judgment: Judgment normal.    Review of Systems  Psychiatric/Behavioral:  The patient is nervous/anxious.   All other systems reviewed and are negative.  Blood pressure 113/65, pulse 79, temperature (!) 97.3 F (36.3 C), resp. rate 20, height 5\' 8"  (1.727 m), weight 79.4 kg, SpO2 99%. Body mass index is 26.61 kg/m.   Treatment Plan Summary: Daily contact with patient to assess and evaluate symptoms and progress in treatment and Medication management Remeron Decrease dose to 2.5 mg to address concerns of weight gain and to evaluate response at a lower dosage. Lexapro Increase dose to 20 mg daily to better manage anxiety and symptoms of PTSD. Vistaril Increase dose to 25 mg every 6 hours as needed for acute anxiety relief. Rehab Consultation: Consult with LCSW  to facilitate rehab placement, prioritizing a supportive, non-toxic environment for recovery and stability Collaborate with the LCSW to identify and secure a rehab facility that meets the patient's needs, with a focus on stable, supportive recovery and trauma-informed care.  [name], NP 10/13/2023, 8:21 PM

## 2023-10-13 NOTE — Plan of Care (Signed)
  Problem: Education: Goal: Knowledge of [facility] Education information/materials will improve Outcome: Progressing Goal: Emotional status will improve Outcome: Progressing Goal: Mental status will improve Outcome: Progressing Goal: Verbalization of understanding the information provided will improve Outcome: Progressing   Problem: Activity: Goal: Interest or engagement in activities will improve Outcome: Progressing Goal: Sleeping patterns will improve Outcome: Progressing   Problem: Coping: Goal: Ability to verbalize frustrations and anger appropriately will improve Outcome: Progressing Goal: Ability to demonstrate self-control will improve Outcome: Progressing   Problem: Health Behavior/Discharge Planning: Goal: Identification of resources available to assist in meeting health care needs will improve Outcome: Progressing Goal: Compliance with treatment plan for underlying cause of condition will improve Outcome: Progressing   Problem: Physical Regulation: Goal: Ability to maintain clinical measurements within normal limits will improve Outcome: Progressing   Problem: Safety: Goal: Periods of time without injury will increase Outcome: Progressing   Problem: Education: Goal: Knowledge of [facility] [place] [facility] Education information/materials will improve Outcome: Progressing Goal: Emotional status will improve Outcome: Progressing Goal: Mental status will improve Outcome: Progressing Goal: Verbalization of understanding the information provided will improve Outcome: Progressing   Problem: Activity: Goal: Interest or engagement in activities will improve Outcome: Progressing Goal: Sleeping patterns will improve Outcome: Progressing   Problem: Coping: Goal: Ability to verbalize frustrations and anger appropriately will improve Outcome: Progressing Goal: Ability to demonstrate self-control will improve Outcome: Progressing   Problem: Health  Behavior/Discharge Planning: Goal: Identification of resources available to assist in meeting health care needs will improve Outcome: Progressing Goal: Compliance with treatment plan for underlying cause of condition will improve Outcome: Progressing   Problem: Physical Regulation: Goal: Ability to maintain clinical measurements within normal limits will improve Outcome: Progressing   Problem: Safety: Goal: Periods of time without injury will increase Outcome: Progressing   Problem: Education: Goal: Utilization of techniques to improve thought processes will improve Outcome: Progressing Goal: Knowledge of the prescribed therapeutic regimen will improve Outcome: Progressing   Problem: Activity: Goal: Interest or engagement in leisure activities will improve Outcome: Progressing Goal: Imbalance in normal sleep/wake cycle will improve Outcome: Progressing   Problem: Coping: Goal: Coping ability will improve Outcome: Progressing Goal: Will verbalize feelings Outcome: Progressing   Problem: Health Behavior/Discharge Planning: Goal: Ability to make decisions will improve Outcome: Progressing Goal: Compliance with therapeutic regimen will improve Outcome: Progressing   Problem: Role Relationship: Goal: Will demonstrate positive changes in social behaviors and relationships Outcome: Progressing   Problem: Safety: Goal: Ability to disclose and discuss suicidal ideas will improve Outcome: Progressing Goal: Ability to identify and utilize support systems that promote safety will improve Outcome: Progressing   Problem: Self-Concept: Goal: Will verbalize positive feelings about self Outcome: Progressing Goal: Level of anxiety will decrease Outcome: Progressing   Problem: Activity: Goal: Will identify at least one activity in which they can participate Outcome: Progressing   Problem: Coping: Goal: Ability to identify and develop effective coping behavior will  improve Outcome: Progressing Goal: Ability to interact with others will improve Outcome: Progressing Goal: Demonstration of participation in decision-making regarding own care will improve Outcome: Progressing Goal: Ability to use eye contact when communicating with others will improve Outcome: Progressing   Problem: Health Behavior/Discharge Planning: Goal: Identification of resources available to assist in meeting health care needs will improve Outcome: Progressing   Problem: Self-Concept: Goal: Will verbalize positive feelings about self Outcome: Progressing

## 2023-10-13 NOTE — Group Note (Signed)
Date:  10/13/2023 Time:  4:31 PM  Group Topic/Focus:  Activity Group:  The focus of this group is to promote activity for the patients and to encourage them to go outside in the courtyard to get some exercise and some fresh air.    Participation Level:  Active  Participation Quality:  Appropriate  Affect:  Appropriate  Cognitive:  Appropriate  Insight: Appropriate  Engagement in Group:  Engaged  Modes of Intervention:  Activity  Additional Comments:    Jeremiah Newman [name] 10/13/2023, 4:31 PM

## 2023-10-13 NOTE — Group Note (Signed)
Date:  10/13/2023 Time:  10:13 AM  Group Topic/Focus:  Goals Group:   The focus of this group is to help patients establish daily goals to achieve during treatment and discuss how the patient can incorporate goal setting into their daily lives to aide in recovery. Healthy Communication:   The focus of this group is to discuss communication, barriers to communication, as well as healthy ways to communicate with others.    Participation Level:  Active  Participation Quality:  Appropriate and Attentive  Affect:  Appropriate  Cognitive:  Alert, Appropriate, and Oriented  Insight: Appropriate  Engagement in Group:  Developing/Improving and Engaged  Modes of Intervention:  Activity, Discussion, Education, and Rapport Building  Additional Comments:    Rosaura Carpenter 10/13/2023, 10:13 AM

## 2023-10-13 NOTE — Group Note (Signed)
[facility] LCSW Group Therapy Note   Group Date: 10/13/2023 Start Time: 1300 End Time: 1420   Type of Therapy/Topic:  Group Therapy:  Emotion Regulation  Participation Level:  Active   Mood:  Description of Group:    The purpose of this group is to assist patients in learning to regulate negative emotions and experience positive emotions. Patients will be guided to discuss ways in which they have been vulnerable to their negative emotions. These vulnerabilities will be juxtaposed with experiences of positive emotions or situations, and patients challenged to use positive emotions to combat negative ones. Special emphasis will be placed on coping with negative emotions in conflict situations, and patients will process healthy conflict resolution skills.  Therapeutic Goals: Patient will identify two positive emotions or experiences to reflect on in order to balance out negative emotions:  Patient will label two or more emotions that they find the most difficult to experience:  Patient will be able to demonstrate positive conflict resolution skills through discussion or role plays:   Summary of Patient Progress:   The patient attended group. Patient proved open to input from peers and feedback from [facility]. The patient was respectful of peers and participated throughout the entire session. The patient participated during The Emotional Regulation BINGO game.      Marshell Levan, LCSW

## 2023-10-13 NOTE — Progress Notes (Signed)
Nursing Shift Note:  1900-0700  Attended Evening Group: Yes, actively participated Medication Compliant:  Yes Behavior: cooperative; anxious Sleep Quality: good Significant Changes:  None noted    10/13/23 0100  Psych Admission Type (Psych Patients Only)  Admission Status Voluntary  Psychosocial Assessment  Patient Complaints Anxiety  Eye Contact Fair  Facial Expression Anxious  Affect Anxious  Speech Logical/coherent  Interaction Minimal  Motor Activity Slow  Appearance/Hygiene Unremarkable  Behavior Characteristics Cooperative  Mood Anxious  Thought Process  Coherency WDL  Content WDL  Delusions None reported or observed  Perception WDL  Hallucination None reported or observed  Judgment WDL  Confusion None  Danger to Self  Current suicidal ideation? Denies  Danger to Others  Danger to Others None reported or observed

## 2023-10-13 NOTE — Group Note (Signed)
Date:  10/13/2023 Time:  9:00 PM  Group Topic/Focus:  Making Healthy Choices:   The focus of this group is to help patients identify negative/unhealthy choices they were using prior to admission and identify positive/healthier coping strategies to replace them upon discharge.    Participation Level:  Active  Participation Quality:  Appropriate and Attentive  Affect:  Appropriate  Cognitive:  Alert and Appropriate  Insight: Appropriate and Good  Engagement in Group:  Developing/Improving and Engaged  Modes of Intervention:  Activity, Discussion, Rapport Building, and Support  Additional Comments:     Jeremiah Newman 10/13/2023, 9:00 PM

## 2023-10-13 NOTE — Progress Notes (Signed)
D- Patient alert and oriented x 4. Affect anxious/mood congruent and pleasant.. Denies SI/ HI/ AVH. Patient denies pain. Patient endorses depression and anxiety. His goal of the day is "peace" and a "safe home plan". A- Scheduled medications administered to patient, per MD orders. Support and encouragement provided.  Routine safety checks conducted every 15 minutes without incident.  Patient informed to notify staff with problems or concerns and verbalizes understanding. R- No adverse drug reactions noted.  Patient compliant with medications and treatment plan. Patient receptive, calm and cooperative. He interacts well with others on the unit.  Patient contracts for safety and  remains safe on the unit at this time.

## 2023-10-14 DIAGNOSIS — F333 Major depressive disorder, recurrent, severe with psychotic symptoms: Secondary | ICD-10-CM | POA: Diagnosis not present

## 2023-10-14 NOTE — Progress Notes (Signed)
[facility] MD Progress Note  10/14/2023 1:10 PM Jeremiah Newman  MRN:  [id number] Subjective:  32yo male has found placment in rehab, arrangements are being made for follow up care. Denies SI/HI/A/V hallucinations Principal Problem: Major depressive disorder, recurrent episode, severe, with psychosis (HCC) Diagnosis: Principal Problem:   Major depressive disorder, recurrent episode, severe, with psychosis (HCC) Active Problems:   PTSD (post-traumatic stress disorder)   Insomnia  Total Time spent with patient: 45 minutes  Past Psychiatric History: Anxiety PTSD  Past Medical History: History reviewed. No pertinent past medical history. History reviewed. No pertinent surgical history. Family History: History reviewed. No pertinent family history. Family Psychiatric  History: Depression Maternal Social History:  Social History   Substance and Sexual Activity  Alcohol Use Yes     Social History   Substance and Sexual Activity  Drug Use No    Social History   Socioeconomic History   Marital status: Single    Spouse name: Not on file   Number of children: Not on file   Years of education: Not on file   Highest education level: Not on file  Occupational History   Not on file  Tobacco Use   Smoking status: Some Days    Current packs/day: 1.00    Average packs/day: 1 pack/day for 8.9 years (8.9 ttl pk-yrs)    Types: Cigarettes    Start date: 2016   Smokeless tobacco: Never  Substance and Sexual Activity   Alcohol use: Yes   Drug use: No   Sexual activity: Not Currently  Other Topics Concern   Not on file  Social History Narrative   Not on file   Social Determinants of Health   Financial Resource Strain: Not on file  Food Insecurity: No Food Insecurity (10/09/2023)   Hunger Vital Sign    Worried About Running Out of Food in the Last Year: Never true    Ran Out of Food in the Last Year: Never true  Transportation Needs: No Transportation Needs (10/09/2023)   PRAPARE -  Administrator, Civil Service (Medical): No    Lack of Transportation (Non-Medical): No  Physical Activity: Not on file  Stress: Not on file  Social Connections: Not on file   Additional Social History:                         Sleep: Good  Appetite:  Good  Current Medications: Current Facility-Administered Medications  Medication Dose Route Frequency Provider Last Rate Last Admin   acetaminophen (TYLENOL) tablet 650 mg  650 mg Oral Q6H PRN Dixon, [name], NP       alum & mag hydroxide-simeth (MAALOX/MYLANTA) 200-200-20 MG/5ML suspension 30 mL  30 mL Oral Q4H PRN Dixon, [name], NP       diphenhydrAMINE (BENADRYL) capsule 50 mg  50 mg Oral TID PRN [name], NP       Or   diphenhydrAMINE (BENADRYL) injection 50 mg  50 mg Intramuscular TID PRN [name], NP       escitalopram (LEXAPRO) tablet 20 mg  20 mg Oral Daily [name], NP   20 mg at 10/14/23 8295   gabapentin (NEURONTIN) capsule 300 mg  300 mg Oral TID Charm Rings, NP   300 mg at 10/14/23 1717   haloperidol (HALDOL) tablet 5 mg  5 mg Oral TID PRN [name], NP       Or   haloperidol  lactate (HALDOL) injection 5 mg  5 mg Intramuscular TID PRN [name], NP       hydrOXYzine (ATARAX) tablet 25 mg  25 mg Oral Q6H PRN [name], NP   25 mg at 10/14/23 0844   LORazepam (ATIVAN) tablet 2 mg  2 mg Oral TID PRN [name], NP       Or   LORazepam (ATIVAN) injection 2 mg  2 mg Intramuscular TID PRN [name], NP       magnesium hydroxide (MILK OF MAGNESIA) suspension 30 mL  30 mL Oral Daily PRN [name], NP       mirtazapine (REMERON) tablet 7.5 mg  7.5 mg Oral QHS Charm Rings, NP   7.5 mg at 10/13/23 2114   QUEtiapine (SEROQUEL) tablet 150 mg  150 mg Oral QHS Charm Rings, NP   150 mg at 10/13/23 2113   traZODone (DESYREL) tablet 100 mg  100 mg Oral QHS PRN Charm Rings, NP   100 mg at 10/13/23 2114    Lab Results: No results found for  this or any previous visit (from the past 48 hour(s)).  Blood Alcohol level:  Lab Results  Component Value Date   ETH <10 10/08/2023   ETH 215 (H) 04/22/2017    Metabolic Disorder Labs: Lab Results  Component Value Date   HGBA1C 4.9 10/10/2023   MPG 93.93 10/10/2023   No results found for: "PROLACTIN" Lab Results  Component Value Date   CHOL 214 (H) 10/10/2023   TRIG 135 10/10/2023   HDL 41 10/10/2023   CHOLHDL 5.2 10/10/2023   VLDL 27 10/10/2023   LDLCALC 146 (H) 10/10/2023    Physical Findings: AIMS:  , ,  ,  ,    CIWA:    COWS:     Musculoskeletal: Strength & Muscle Tone: within normal limits Gait & Station: normal Patient leans: N/A  Psychiatric Specialty Exam:  Presentation  General Appearance:  Appropriate for Environment  Eye Contact: Good  Speech: Clear and Coherent; Normal Rate  Speech Volume: Normal  Handedness: Right   Mood and Affect  Mood: Euthymic  Affect: Appropriate; Congruent   Thought Process  Thought Processes: Coherent  Descriptions of Associations:Intact  Orientation:Full (Time, Place and Person) (and situation)  Thought Content:WDL  History of Schizophrenia/Schizoaffective disorder:No  Duration of Psychotic Symptoms:N/A  Hallucinations:Hallucinations: None  Ideas of Reference:None  Suicidal Thoughts:Suicidal Thoughts: Yes, Passive SI Passive Intent and/or Plan: Without Intent; Without Plan; Without Access to Means; Without Means to Carry Out  Homicidal Thoughts:Homicidal Thoughts: No   Sensorium  Memory: Immediate Good; Remote Good  Judgment: Fair  Insight: Fair   Art therapist  Concentration: Fair  Attention Span: Good  Recall: Good  Fund of Knowledge: Good  Language: Good   Psychomotor Activity  Psychomotor Activity: Psychomotor Activity: Normal   Assets  Assets: Communication Skills; Desire for Improvement; Resilience   Sleep  Sleep: Sleep: Good Number of  Hours of Sleep: 6    Physical Exam: Physical Exam Vitals and nursing note reviewed.  Constitutional:      Appearance: Normal appearance.  HENT:     Head: Normocephalic and atraumatic.     Nose: Nose normal.  Pulmonary:     Effort: Pulmonary effort is normal.  Musculoskeletal:        General: Normal range of motion.     Cervical back: Normal range of motion.  Neurological:     General: No focal deficit present.  Mental Status: He is alert and oriented to person, place, and time.  Psychiatric:        Attention and Perception: Attention and perception normal.        Mood and Affect: Mood and affect normal.        Speech: Speech normal.        Behavior: Behavior normal. Behavior is cooperative.        Thought Content: Thought content normal.        Cognition and Memory: Cognition and memory normal.        Judgment: Judgment normal.    Review of Systems  All other systems reviewed and are negative.  Blood pressure 113/65, pulse 79, temperature (!) 97.3 F (36.3 C), resp. rate 20, height 5\' 8"  (1.727 m), weight 79.4 kg, SpO2 99%. Body mass index is 26.61 kg/m.   Treatment Plan Summary: Daily contact with patient to assess and evaluate symptoms and progress in treatment and Medication management Remeron  2.5 mg to address concerns of weight gain and to evaluate response at a lower dosage. Lexapro Increase dose to 20 mg daily to better manage anxiety and symptoms of PTSD  [name], NP 10/14/2023, 6:56 PM

## 2023-10-14 NOTE — [facility] Counselor (Signed)
CSW provided patient with referral to [facility].  Patient completed and returned the form.  CSW sent the fax with confirmation of receipt.  Penni Homans, MSW, LCSW 10/14/2023 3:13 PM

## 2023-10-14 NOTE — Plan of Care (Signed)
  Problem: Education: Goal: Knowledge of [facility] Education information/materials will improve Outcome: Progressing Goal: Emotional status will improve Outcome: Progressing Goal: Mental status will improve Outcome: Progressing Goal: Verbalization of understanding the information provided will improve Outcome: Progressing   Problem: Activity: Goal: Interest or engagement in activities will improve Outcome: Progressing Goal: Sleeping patterns will improve Outcome: Progressing   Problem: Coping: Goal: Ability to verbalize frustrations and anger appropriately will improve Outcome: Progressing Goal: Ability to demonstrate self-control will improve Outcome: Progressing   Problem: Health Behavior/Discharge Planning: Goal: Identification of resources available to assist in meeting health care needs will improve Outcome: Progressing Goal: Compliance with treatment plan for underlying cause of condition will improve Outcome: Progressing   Problem: Physical Regulation: Goal: Ability to maintain clinical measurements within normal limits will improve Outcome: Progressing   Problem: Safety: Goal: Periods of time without injury will increase Outcome: Progressing   Problem: Education: Goal: Knowledge of [facility] [place] [facility] Education information/materials will improve Outcome: Progressing Goal: Emotional status will improve Outcome: Progressing Goal: Mental status will improve Outcome: Progressing Goal: Verbalization of understanding the information provided will improve Outcome: Progressing   Problem: Activity: Goal: Interest or engagement in activities will improve Outcome: Progressing Goal: Sleeping patterns will improve Outcome: Progressing   Problem: Coping: Goal: Ability to verbalize frustrations and anger appropriately will improve Outcome: Progressing Goal: Ability to demonstrate self-control will improve Outcome: Progressing   Problem: Health  Behavior/Discharge Planning: Goal: Identification of resources available to assist in meeting health care needs will improve Outcome: Progressing Goal: Compliance with treatment plan for underlying cause of condition will improve Outcome: Progressing   Problem: Physical Regulation: Goal: Ability to maintain clinical measurements within normal limits will improve Outcome: Progressing   Problem: Safety: Goal: Periods of time without injury will increase Outcome: Progressing   Problem: Education: Goal: Utilization of techniques to improve thought processes will improve Outcome: Progressing Goal: Knowledge of the prescribed therapeutic regimen will improve Outcome: Progressing   Problem: Activity: Goal: Interest or engagement in leisure activities will improve Outcome: Progressing Goal: Imbalance in normal sleep/wake cycle will improve Outcome: Progressing   Problem: Coping: Goal: Coping ability will improve Outcome: Progressing Goal: Will verbalize feelings Outcome: Progressing   Problem: Health Behavior/Discharge Planning: Goal: Ability to make decisions will improve Outcome: Progressing Goal: Compliance with therapeutic regimen will improve Outcome: Progressing   Problem: Role Relationship: Goal: Will demonstrate positive changes in social behaviors and relationships Outcome: Progressing   Problem: Safety: Goal: Ability to disclose and discuss suicidal ideas will improve Outcome: Progressing Goal: Ability to identify and utilize support systems that promote safety will improve Outcome: Progressing   Problem: Self-Concept: Goal: Will verbalize positive feelings about self Outcome: Progressing Goal: Level of anxiety will decrease Outcome: Progressing   Problem: Activity: Goal: Will identify at least one activity in which they can participate Outcome: Progressing   Problem: Coping: Goal: Ability to identify and develop effective coping behavior will  improve Outcome: Progressing Goal: Ability to interact with others will improve Outcome: Progressing Goal: Demonstration of participation in decision-making regarding own care will improve Outcome: Progressing Goal: Ability to use eye contact when communicating with others will improve Outcome: Progressing   Problem: Health Behavior/Discharge Planning: Goal: Identification of resources available to assist in meeting health care needs will improve Outcome: Progressing   Problem: Self-Concept: Goal: Will verbalize positive feelings about self Outcome: Progressing

## 2023-10-14 NOTE — Group Note (Signed)
Date:  10/14/2023 Time:  10:04 PM  Group Topic/Focus:  Wrap-Up Group:   The focus of this group is to help patients review their daily goal of treatment and discuss progress on daily workbooks.    Participation Level:  Active  Participation Quality:  Appropriate, Attentive, Sharing, and Supportive  Affect:  Appropriate and Excited  Cognitive:  Alert and Appropriate  Insight: Appropriate and Good  Engagement in Group:  Engaged  Modes of Intervention:  Discussion  Additional Comments:     [name],Zavion Sleight E 10/14/2023, 10:04 PM

## 2023-10-14 NOTE — Group Note (Signed)
Date:  10/14/2023 Time:  10:22 AM  Group Topic/Focus:  Dimensions of Wellness:   The focus of this group is to introduce the topic of wellness and discuss the role each dimension of wellness plays in total health. Goals Group:   The focus of this group is to help patients establish daily goals to achieve during treatment and discuss how the patient can incorporate goal setting into their daily lives to aide in recovery.    Participation Level:  Active  Participation Quality:  Appropriate and Attentive  Affect:  Appropriate  Cognitive:  Alert, Appropriate, and Oriented  Insight: Appropriate  Engagement in Group:  Developing/Improving and Engaged  Modes of Intervention:  Activity, Discussion, and Education  Additional Comments:    Jeremiah Newman 10/14/2023, 10:22 AM

## 2023-10-14 NOTE — BH IP Treatment Plan (Signed)
Interdisciplinary Treatment and Diagnostic Plan Update  10/14/2023 Time of Session: 9:00AM HAOX[place] Jeremiah Newman MRN: [id number]  Principal Diagnosis: Major depressive disorder, recurrent episode, severe, with psychosis (HCC)  Secondary Diagnoses: Principal Problem:   Major depressive disorder, recurrent episode, severe, with psychosis (HCC) Active Problems:   PTSD (post-traumatic stress disorder)   Insomnia   Current Medications:  Current Facility-Administered Medications  Medication Dose Route Frequency Provider Last Rate Last Admin   acetaminophen (TYLENOL) tablet 650 mg  650 mg Oral Q6H PRN [name], NP       alum & mag hydroxide-simeth (MAALOX/MYLANTA) 200-200-20 MG/5ML suspension 30 mL  30 mL Oral Q4H PRN Dixon, [name], NP       diphenhydrAMINE (BENADRYL) capsule 50 mg  50 mg Oral TID PRN [name], NP       Or   diphenhydrAMINE (BENADRYL) injection 50 mg  50 mg Intramuscular TID PRN [name], NP       escitalopram (LEXAPRO) tablet 20 mg  20 mg Oral Daily [name], NP   20 mg at 10/14/23 0865   gabapentin (NEURONTIN) capsule 300 mg  300 mg Oral TID Charm Rings, NP   300 mg at 10/14/23 1222   haloperidol (HALDOL) tablet 5 mg  5 mg Oral TID PRN [name], NP       Or   haloperidol lactate (HALDOL) injection 5 mg  5 mg Intramuscular TID PRN [name], NP       hydrOXYzine (ATARAX) tablet 25 mg  25 mg Oral Q6H PRN [name], NP   25 mg at 10/14/23 0844   LORazepam (ATIVAN) tablet 2 mg  2 mg Oral TID PRN [name], NP       Or   LORazepam (ATIVAN) injection 2 mg  2 mg Intramuscular TID PRN [name], NP       magnesium hydroxide (MILK OF MAGNESIA) suspension 30 mL  30 mL Oral Daily PRN [name], NP       mirtazapine (REMERON) tablet 7.5 mg  7.5 mg Oral QHS Charm Rings, NP   7.5 mg at 10/13/23 2114   QUEtiapine (SEROQUEL) tablet 150 mg  150 mg Oral QHS Charm Rings, NP   150 mg at 10/13/23 2113    traZODone (DESYREL) tablet 100 mg  100 mg Oral QHS PRN Charm Rings, NP   100 mg at 10/13/23 2114   PTA Medications: No medications prior to admission.    Patient Stressors: Substance abuse   Traumatic event    Patient Strengths: Average or above average intelligence  Capable of independent living   Treatment Modalities: Medication Management, Group therapy, Case management,  1 to 1 session with clinician, Psychoeducation, Recreational therapy.   Physician Treatment Plan for Primary Diagnosis: Major depressive disorder, recurrent episode, severe, with psychosis (HCC) Long Term Goal(s): Improvement in symptoms so as ready for discharge   Short Term Goals: Ability to identify changes in lifestyle to reduce recurrence of condition will improve Ability to verbalize feelings will improve Ability to disclose and discuss suicidal ideas Ability to demonstrate self-control will improve Ability to identify and develop effective coping behaviors will improve Ability to maintain clinical measurements within normal limits will improve Compliance with prescribed medications will improve Ability to identify triggers associated with substance abuse/mental health issues will improve  Medication Management: Evaluate patient's response, side effects, and tolerance of medication regimen.  Therapeutic Interventions: 1 to 1 sessions,  Unit Group sessions and Medication administration.  Evaluation of Outcomes: Progressing  Physician Treatment Plan for Secondary Diagnosis: Principal Problem:   Major depressive disorder, recurrent episode, severe, with psychosis (HCC) Active Problems:   PTSD (post-traumatic stress disorder)   Insomnia  Long Term Goal(s): Improvement in symptoms so as ready for discharge   Short Term Goals: Ability to identify changes in lifestyle to reduce recurrence of condition will improve Ability to verbalize feelings will improve Ability to disclose and discuss suicidal  ideas Ability to demonstrate self-control will improve Ability to identify and develop effective coping behaviors will improve Ability to maintain clinical measurements within normal limits will improve Compliance with prescribed medications will improve Ability to identify triggers associated with substance abuse/mental health issues will improve     Medication Management: Evaluate patient's response, side effects, and tolerance of medication regimen.  Therapeutic Interventions: 1 to 1 sessions, Unit Group sessions and Medication administration.  Evaluation of Outcomes: Progressing   RN Treatment Plan for Primary Diagnosis: Major depressive disorder, recurrent episode, severe, with psychosis (HCC) Long Term Goal(s): Knowledge of disease and therapeutic regimen to maintain health will improve  Short Term Goals: Ability to demonstrate self-control, Ability to participate in decision making will improve, Ability to verbalize feelings will improve, Ability to disclose and discuss suicidal ideas, Ability to identify and develop effective coping behaviors will improve, and Compliance with prescribed medications will improve  Medication Management: RN will administer medications as ordered by provider, will assess and evaluate patient's response and provide education to patient for prescribed medication. RN will report any adverse and/or side effects to prescribing provider.  Therapeutic Interventions: 1 on 1 counseling sessions, Psychoeducation, Medication administration, Evaluate responses to treatment, Monitor vital signs and CBGs as ordered, Perform/monitor CIWA, COWS, AIMS and Fall Risk screenings as ordered, Perform wound care treatments as ordered.  Evaluation of Outcomes: Progressing   LCSW Treatment Plan for Primary Diagnosis: Major depressive disorder, recurrent episode, severe, with psychosis (HCC) Long Term Goal(s): Safe transition to appropriate next level of care at discharge,  Engage patient in therapeutic group addressing interpersonal concerns.  Short Term Goals: Engage patient in aftercare planning with referrals and resources, Increase social support, Increase ability to appropriately verbalize feelings, Increase emotional regulation, Facilitate acceptance of mental health diagnosis and concerns, Facilitate patient progression through stages of change regarding substance use diagnoses and concerns, Identify triggers associated with mental health/substance abuse issues, and Increase skills for wellness and recovery  Therapeutic Interventions: Assess for all discharge needs, 1 to 1 time with Social worker, Explore available resources and support systems, Assess for adequacy in community support network, Educate family and significant other(s) on suicide prevention, Complete Psychosocial Assessment, Interpersonal group therapy.  Evaluation of Outcomes: Progressing   Progress in Treatment: Attending groups: Yes. Participating in groups: Yes. Taking medication as prescribed: Yes. Toleration medication: Yes. Family/Significant other contact made: No, will contact:  once permission has been given Patient understands diagnosis: Yes. Discussing patient identified problems/goals with staff: Yes. Medical problems stabilized or resolved: Yes. Denies suicidal/homicidal ideation: Yes. Issues/concerns per patient self-inventory: No. Other: none  New problem(s) identified: Yes, Describe:  Patient is not comfortable at home and is interested in possible group home placement. Update 10/14/2023:  Patient is now seeking placement at a substance abuse residential facility.     New Short Term/Long Term Goal(s):detox, elimination of symptoms of psychosis, medication management for mood stabilization; elimination of SI thoughts; development of comprehensive mental wellness/sobriety plan.  Update 10/14/2023:   No changes at  this time.    Patient Goals:  "Have peace in my life."  Update 10/14/2023:   No changes at this time.    Discharge Plan or Barriers:  Update 10/14/2023:   Patient reports that he does not want to return to the home with his parents.  He states that his parents are "toxic".  He is requesting CSW assistance with placement at a residential facility.      Reason for Continuation of Hospitalization: Anxiety Depression Medication stabilization Withdrawal symptoms   Estimated Length of Stay:1-7 days.  Update 10/14/2023:   TBD.   Last 3 [place] Suicide Severity Risk Score: Flowsheet Row Admission (Current) from 10/09/2023 in [facility] INPATIENT BE[facility]IORAL MEDICINE ED from 10/08/2023 in [facility] Emergency Department at [facility]  C-SSRS RISK CATEGORY Moderate Risk Moderate Risk       Last PHQ 2/9 Scores:     No data to display          Scribe for Treatment Team: Gaylan Gerold 10/14/2023 12:36 PM

## 2023-10-14 NOTE — Progress Notes (Signed)
   10/14/23 1400  Spiritual Encounters  Type of Visit Initial  Care provided to: Patient  Conversation partners present during encounter Nurse  Referral source Patient request  Reason for visit Routine spiritual support  OnCall Visit No  Spiritual Framework  Presenting Themes Values and beliefs   While doing rounds patient asked chaplain if she could bring him a Bible. Chaplain brought the patient a Bible and offered him encouraging words.

## 2023-10-14 NOTE — [facility] Counselor (Signed)
CSW spoke with the patient about aftercare plans. CSW as provided patient with referral packet for [facility].  Patient to fill out and return to this CSW.    Penni Homans, MSW, LCSW 10/14/2023 1:49 PM

## 2023-10-14 NOTE — Plan of Care (Signed)
Patient pleasant and cooperative on approach. Patient stated that he still hearing voices to hurt himself  when he thinks about some situations. Patient denies SI,HI and AVH at this time. Patient appropriate with staff & peers. Patient stated that he likes to go for a rehab program,talk to the SW about it.Appetite and energy level good. Support and encouragement given.

## 2023-10-14 NOTE — Group Note (Signed)
Date:  10/14/2023 Time:  6:11 PM  Group Topic/Focus:  OUTDOOR RECREATION STRUCTURED ACTIVITY    Participation Level:  Active  Participation Quality:  Appropriate, Attentive, Sharing, and Supportive  Affect:  Appropriate and Excited  Cognitive:  Alert, Appropriate, and Oriented  Insight: Appropriate and Improving  Engagement in Group:  Developing/Improving, Engaged, and Supportive  Modes of Intervention:  Activity, Discussion, Role-play, and Socialization  Additional Comments:    Rosaura Carpenter 10/14/2023, 6:11 PM

## 2023-10-15 NOTE — Group Note (Signed)
[facility] LCSW Group Therapy Note    Group Date: 10/15/2023 Start Time: 1330 End Time: 1430  Type of Therapy and Topic:  Group Therapy:  Overcoming Obstacles  Participation Level:  [facility] PARTICIPATION LEVEL: Did Not Attend  Mood:  Description of Group:   In this group patients will be encouraged to explore what they see as obstacles to their own wellness and recovery. They will be guided to discuss their thoughts, feelings, and behaviors related to these obstacles. The group will process together ways to cope with barriers, with attention given to specific choices patients can make. Each patient will be challenged to identify changes they are motivated to make in order to overcome their obstacles. This group will be process-oriented, with patients participating in exploration of their own experiences as well as giving and receiving support and challenge from other group members.  Therapeutic Goals: 1. Patient will identify personal and current obstacles as they relate to admission. 2. Patient will identify barriers that currently interfere with their wellness or overcoming obstacles.  3. Patient will identify feelings, thought process and behaviors related to these barriers. 4. Patient will identify two changes they are willing to make to overcome these obstacles:    Summary of Patient Progress   X   Therapeutic Modalities:   Cognitive Behavioral Therapy Solution Focused Therapy Motivational Interviewing Relapse Prevention Therapy   Harden Mo, LCSW

## 2023-10-15 NOTE — Progress Notes (Signed)
[facility] MD Progress Note  10/16/2023 9:29 AM Jeremiah Newman  MRN:  [id number] Subjective:   32 year old Caucasian male, reports, "I am trying to find somewhere to go. I can't go home to my family." He expresses concerns about his living situation but denies suicidal ideation (SI), homicidal ideation (HI), or self-injurious behaviors (SIB) at this time.The patient is experiencing acute distress related to housing instability. While he denies SI, HI, or SIB, the lack of a stable living situation may contribute to increased stress and anxiety, warranting further intervention to ensure safety and stability. Principal Problem: Major depressive disorder, recurrent episode, severe, with psychosis (HCC) Diagnosis: Principal Problem:   Major depressive disorder, recurrent episode, severe, with psychosis (HCC) Active Problems:   PTSD (post-traumatic stress disorder)   Insomnia  Total Time spent with patient: 45 minutes  Past Psychiatric History: PTSD MDD  Past Medical History: History reviewed. No pertinent past medical history. History reviewed. No pertinent surgical history. Family History: History reviewed. No pertinent family history. Family Psychiatric  History: Mother Substance abuse  Social History:  Social History   Substance and Sexual Activity  Alcohol Use Yes     Social History   Substance and Sexual Activity  Drug Use No    Social History   Socioeconomic History   Marital status: Single    Spouse name: Not on file   Number of children: Not on file   Years of education: Not on file   Highest education level: Not on file  Occupational History   Not on file  Tobacco Use   Smoking status: Some Days    Current packs/day: 1.00    Average packs/day: 1 pack/day for 8.9 years (8.9 ttl pk-yrs)    Types: Cigarettes    Start date: 2016   Smokeless tobacco: Never  Substance and Sexual Activity   Alcohol use: Yes   Drug use: No   Sexual activity: Not Currently  Other Topics  Concern   Not on file  Social History Narrative   Not on file   Social Determinants of Health   Financial Resource Strain: Not on file  Food Insecurity: No Food Insecurity (10/09/2023)   Hunger Vital Sign    Worried About Running Out of Food in the Last Year: Never true    Ran Out of Food in the Last Year: Never true  Transportation Needs: No Transportation Needs (10/09/2023)   PRAPARE - Administrator, Civil Service (Medical): No    Lack of Transportation (Non-Medical): No  Physical Activity: Not on file  Stress: Not on file  Social Connections: Not on file   Additional Social History:                         Sleep: Good  Appetite:  Good  Current Medications: Current Facility-Administered Medications  Medication Dose Route Frequency Provider Last Rate Last Admin   acetaminophen (TYLENOL) tablet 650 mg  650 mg Oral Q6H PRN [name], NP   650 mg at 10/15/23 2118   alum & mag hydroxide-simeth (MAALOX/MYLANTA) 200-200-20 MG/5ML suspension 30 mL  30 mL Oral Q4H PRN [name], NP       diphenhydrAMINE (BENADRYL) capsule 50 mg  50 mg Oral TID PRN [name], NP       Or   diphenhydrAMINE (BENADRYL) injection 50 mg  50 mg Intramuscular TID PRN [name], NP       escitalopram (LEXAPRO) tablet 20  mg  20 mg Oral Daily [name], NP   20 mg at 10/16/23 4098   gabapentin (NEURONTIN) capsule 300 mg  300 mg Oral TID Charm Rings, NP   300 mg at 10/16/23 1191   haloperidol (HALDOL) tablet 5 mg  5 mg Oral TID PRN [name], NP       Or   haloperidol lactate (HALDOL) injection 5 mg  5 mg Intramuscular TID PRN [name], NP       hydrOXYzine (ATARAX) tablet 25 mg  25 mg Oral Q6H PRN [name], NP   25 mg at 10/14/23 0844   LORazepam (ATIVAN) tablet 2 mg  2 mg Oral TID PRN [name], NP       Or   LORazepam (ATIVAN) injection 2 mg  2 mg Intramuscular TID PRN [name], NP       magnesium hydroxide (MILK OF  MAGNESIA) suspension 30 mL  30 mL Oral Daily PRN [name], NP   30 mL at 10/15/23 0841   mirtazapine (REMERON) tablet 7.5 mg  7.5 mg Oral QHS Charm Rings, NP   7.5 mg at 10/15/23 2118   QUEtiapine (SEROQUEL) tablet 150 mg  150 mg Oral QHS Charm Rings, NP   150 mg at 10/15/23 2118   traZODone (DESYREL) tablet 100 mg  100 mg Oral QHS PRN Charm Rings, NP   100 mg at 10/13/23 2114    Lab Results: No results found for this or any previous visit (from the past 48 hour(s)).  Blood Alcohol level:  Lab Results  Component Value Date   ETH <10 10/08/2023   ETH 215 (H) 04/22/2017    Metabolic Disorder Labs: Lab Results  Component Value Date   HGBA1C 4.9 10/10/2023   MPG 93.93 10/10/2023   No results found for: "PROLACTIN" Lab Results  Component Value Date   CHOL 214 (H) 10/10/2023   TRIG 135 10/10/2023   HDL 41 10/10/2023   CHOLHDL 5.2 10/10/2023   VLDL 27 10/10/2023   LDLCALC 146 (H) 10/10/2023    Physical Findings: AIMS:  , ,  ,  ,    CIWA:    COWS:     Musculoskeletal: Strength & Muscle Tone: within normal limits Gait & Station: normal Patient leans: N/A  Psychiatric Specialty Exam:  Presentation  General Appearance:  Appropriate for Environment  Eye Contact: Good  Speech: Clear and Coherent; Normal Rate  Speech Volume: Normal  Handedness: Right   Mood and Affect  Mood: Euthymic  Affect: Flat   Thought Process  Thought Processes: Coherent  Descriptions of Associations:Intact  Orientation:Full (Time, Place and Person)  Thought Content:WDL  History of Schizophrenia/Schizoaffective disorder:No  Duration of Psychotic Symptoms:N/A  Hallucinations:Hallucinations: None  Ideas of Reference:None  Suicidal Thoughts:Suicidal Thoughts: No SI Passive Intent and/or Plan: -- (denies)  Homicidal Thoughts:Homicidal Thoughts: No   Sensorium  Memory: Immediate Good; Remote Good  Judgment: Fair  Insight: Fair   Restaurant manager, fast food  Concentration: Good  Attention Span: Good  Recall: Good  Fund of Knowledge: Good  Language: Good   Psychomotor Activity  Psychomotor Activity:Psychomotor Activity: Normal   Assets  Assets: Communication Skills; Desire for Improvement; Social Support   Sleep  Sleep:Sleep: Fair Number of Hours of Sleep: 7    Physical Exam: Physical Exam Vitals and nursing note reviewed.  Constitutional:      Appearance: Normal appearance.  HENT:     Head: Normocephalic and  atraumatic.     Nose: Nose normal.  Pulmonary:     Effort: Pulmonary effort is normal.  Musculoskeletal:        General: Normal range of motion.     Cervical back: Normal range of motion.  Neurological:     Mental Status: He is alert.  Psychiatric:        Attention and Perception: Attention and perception normal.        Mood and Affect: Affect normal. Mood is anxious.        Speech: Speech normal.        Behavior: Behavior normal. Behavior is cooperative.        Thought Content: Thought content normal.        Cognition and Memory: Cognition and memory normal.        Judgment: Judgment normal.   Review of Systems  Psychiatric/Behavioral:  The patient is nervous/anxious.   All other systems reviewed and are negative.  Blood pressure (!) 101/59, pulse 69, temperature 98.2 F (36.8 C), resp. rate 18, height 5\' 8"  (1.727 m), weight 79.4 kg, SpO2 98%. Body mass index is 26.61 kg/m.   Treatment Plan Summary: Daily contact with patient to assess and evaluate symptoms and progress in treatment and Medication management Remeron 2.5 mg to address concerns of weight gain and to evaluate response at a lower dosage. Lexapro Increase dose to 20 mg daily to better manage anxiety and symptoms of PTSD  [name], NP 10/16/2023, 9:29 AM

## 2023-10-15 NOTE — Plan of Care (Signed)
  Problem: Education: Goal: Knowledge of [place] [facility] Education information/materials will improve 10/15/2023 1522 by Delos Haring, RN Outcome: Progressing 10/15/2023 1522 by Delos Haring, RN Outcome: Progressing Goal: Emotional status will improve Outcome: Progressing Goal: Mental status will improve Outcome: Progressing Goal: Verbalization of understanding the information provided will improve Outcome: Progressing   Problem: Activity: Goal: Interest or engagement in activities will improve Outcome: Progressing Goal: Sleeping patterns will improve Outcome: Progressing   Problem: Coping: Goal: Ability to verbalize frustrations and anger appropriately will improve Outcome: Progressing Goal: Ability to demonstrate self-control will improve Outcome: Progressing   Problem: Health Behavior/Discharge Planning: Goal: Identification of resources available to assist in meeting health care needs will improve Outcome: Progressing Goal: Compliance with treatment plan for underlying cause of condition will improve Outcome: Progressing   Problem: Physical Regulation: Goal: Ability to maintain clinical measurements within normal limits will improve Outcome: Progressing   Problem: Safety: Goal: Periods of time without injury will increase Outcome: Progressing   Problem: Education: Goal: Knowledge of [place] [facility] Education information/materials will improve Outcome: Progressing Goal: Emotional status will improve Outcome: Progressing Goal: Mental status will improve Outcome: Progressing Goal: Verbalization of understanding the information provided will improve Outcome: Progressing   Problem: Activity: Goal: Interest or engagement in activities will improve Outcome: Progressing Goal: Sleeping patterns will improve Outcome: Progressing   Problem: Coping: Goal: Ability to verbalize frustrations and anger appropriately will improve Outcome: Progressing Goal:  Ability to demonstrate self-control will improve Outcome: Progressing   Problem: Health Behavior/Discharge Planning: Goal: Identification of resources available to assist in meeting health care needs will improve Outcome: Progressing Goal: Compliance with treatment plan for underlying cause of condition will improve Outcome: Progressing   Problem: Physical Regulation: Goal: Ability to maintain clinical measurements within normal limits will improve Outcome: Progressing   Problem: Safety: Goal: Periods of time without injury will increase Outcome: Progressing   Problem: Education: Goal: Utilization of techniques to improve thought processes will improve Outcome: Progressing Goal: Knowledge of the prescribed therapeutic regimen will improve Outcome: Progressing   Problem: Activity: Goal: Interest or engagement in leisure activities will improve Outcome: Progressing Goal: Imbalance in normal sleep/wake cycle will improve Outcome: Progressing   Problem: Coping: Goal: Coping ability will improve Outcome: Progressing Goal: Will verbalize feelings Outcome: Progressing   Problem: Health Behavior/Discharge Planning: Goal: Ability to make decisions will improve Outcome: Progressing Goal: Compliance with therapeutic regimen will improve Outcome: Progressing   Problem: Role Relationship: Goal: Will demonstrate positive changes in social behaviors and relationships Outcome: Progressing   Problem: Safety: Goal: Ability to disclose and discuss suicidal ideas will improve Outcome: Progressing Goal: Ability to identify and utilize support systems that promote safety will improve Outcome: Progressing   Problem: Self-Concept: Goal: Will verbalize positive feelings about self Outcome: Progressing Goal: Level of anxiety will decrease Outcome: Progressing   Problem: Activity: Goal: Will identify at least one activity in which they can participate Outcome: Progressing   Problem:  Coping: Goal: Ability to identify and develop effective coping behavior will improve Outcome: Progressing Goal: Ability to interact with others will improve Outcome: Progressing Goal: Demonstration of participation in decision-making regarding own care will improve Outcome: Progressing Goal: Ability to use eye contact when communicating with others will improve Outcome: Progressing   Problem: Health Behavior/Discharge Planning: Goal: Identification of resources available to assist in meeting health care needs will improve Outcome: Progressing   Problem: Self-Concept: Goal: Will verbalize positive feelings about self Outcome: Progressing

## 2023-10-15 NOTE — Group Note (Signed)
Recreation Therapy Group Note   Group Topic:Coping Skills  Group Date: 10/15/2023 Start Time: 1000 End Time: 1055 Facilitators: Rosina Lowenstein, LRT, CTRS Location:  Craft Room  Group Description: Mind Map.  Patient was provided a blank template of a diagram with 32 blank boxes in a tiered system, branching from the center (similar to a bubble chart). LRT directed patients to label the middle of the diagram "Coping Skills". LRT and patients then came up with 8 different coping skills as examples. Pt were directed to record their coping skills in the 2nd tier boxes closest to the center.  Patients would then share their coping skills with the group as LRT wrote them out. LRT gave a handout of 99 different coping skills at the end of group.   Goal Area(s) Addressed: Patients will be able to define "coping skills". Patient will identify new coping skills.  Patient will increase communication.   Affect/Mood: Appropriate   Participation Level: Active and Engaged   Participation Quality: Independent   Behavior: Calm and Cooperative   Speech/Thought Process: Coherent   Insight: Good   Judgement: Good   Modes of Intervention: Worksheet   Patient Response to Interventions:  Attentive, Engaged, Interested , and Receptive   Education Outcome:  Acknowledges education   Clinical Observations/Individualized Feedback: Jeremiah Newman was active in their participation of session activities and group discussion. Pt identified "eating sushi, board games, cards, and writing" as coping skills. Pt interacted well with LRT and peers duration of session.    Plan: Continue to engage patient in RT group sessions 2-3x/week.   Rosina Lowenstein, LRT, CTRS 10/15/2023 12:45 PM

## 2023-10-15 NOTE — Group Note (Signed)
Date:  10/15/2023 Time:  6:53 PM  Group Topic/Focus:  Healthy Communication:   The focus of this group is to discuss communication, barriers to communication, as well as healthy ways to communicate with others.  OUTDOOR RECREATION STRUCTURED ACTIVITY     Participation Level:  Active  Participation Quality:  Appropriate and Attentive  Affect:  Appropriate and Excited  Cognitive:  Alert, Appropriate, and Oriented  Insight: Appropriate  Engagement in Group:  Developing/Improving and Engaged  Modes of Intervention:  Activity, Discussion, Exploration, and Socialization  Additional Comments:    Rosaura Carpenter 10/15/2023, 6:53 PM

## 2023-10-15 NOTE — Group Note (Signed)
Date:  10/15/2023 Time:  11:20 AM  Group Topic/Focus:  Diagnosis Education:   The focus of this group is to discuss the major disorders that patients maybe diagnosed with.  Group discusses the importance of knowing what one's diagnosis is so that one can understand treatment and better advocate for oneself. Self Care:   The focus of this group is to help patients understand the importance of self-care in order to improve or restore emotional, physical, spiritual, interpersonal, and financial health.    Participation Level:  Active  Participation Quality:  Appropriate and Attentive  Affect:  Appropriate  Cognitive:  Alert and Appropriate  Insight: Appropriate  Engagement in Group:  Developing/Improving and Engaged  Modes of Intervention:  Activity, Discussion, Education, Socialization, and Support  Additional Comments:    Jeremiah Newman 10/15/2023, 11:20 AM

## 2023-10-15 NOTE — Progress Notes (Signed)
Pt iS alert and oriented X4. Affect bright/mood congruent. Pt denies anxiety/depression SI/HI/AVH as well as pain at this time.   Scheduled medications administered to patient per MD orders.Support and  encouragement provided .Routine safety checks conducted  every 15 minutes without incident, Patient informed to notify staff with problems or concerns and verbalize understanding.   No adverse drug noted. Pt is compliant with medications and treatment plan. Pt is receptive calm, cooperative and interacts well with others on the unit. Pt contracts for safety and remains safe on the unit at this time.

## 2023-10-15 NOTE — Progress Notes (Signed)
D- Patient alert and oriented x 4. Affect anxious/mood congruent. Denies SI/ HI/ AVH. Patient denies pain. Patient endorses mild depression and anxiety. His goals today are "to be at peace and be a positive person". A- Scheduled medications administered to patient, per MD orders. Support and encouragement provided.  Routine safety checks conducted every 15 minutes without incident.  Patient informed to notify staff with problems or concerns and verbalizes understanding. R- No adverse drug reactions noted.  Patient compliant with medications and treatment plan. Patient receptive and cooperative. She interacts well with others on the unit.  Patient contracts for safety and  remains safe on the unit at this time.

## 2023-10-15 NOTE — Plan of Care (Signed)
  Problem: Education: Goal: Knowledge of [facility] Education information/materials will improve Outcome: Progressing Goal: Emotional status will improve Outcome: Progressing Goal: Mental status will improve Outcome: Progressing Goal: Verbalization of understanding the information provided will improve Outcome: Progressing   Problem: Activity: Goal: Interest or engagement in activities will improve Outcome: Progressing Goal: Sleeping patterns will improve Outcome: Progressing   Problem: Coping: Goal: Ability to verbalize frustrations and anger appropriately will improve Outcome: Progressing Goal: Ability to demonstrate self-control will improve Outcome: Progressing   Problem: Health Behavior/Discharge Planning: Goal: Identification of resources available to assist in meeting health care needs will improve Outcome: Progressing Goal: Compliance with treatment plan for underlying cause of condition will improve Outcome: Progressing   Problem: Physical Regulation: Goal: Ability to maintain clinical measurements within normal limits will improve Outcome: Progressing   Problem: Safety: Goal: Periods of time without injury will increase Outcome: Progressing   Problem: Education: Goal: Knowledge of [facility] [place] [facility] Education information/materials will improve Outcome: Progressing Goal: Emotional status will improve Outcome: Progressing Goal: Mental status will improve Outcome: Progressing Goal: Verbalization of understanding the information provided will improve Outcome: Progressing   Problem: Activity: Goal: Interest or engagement in activities will improve Outcome: Progressing Goal: Sleeping patterns will improve Outcome: Progressing   Problem: Coping: Goal: Ability to verbalize frustrations and anger appropriately will improve Outcome: Progressing Goal: Ability to demonstrate self-control will improve Outcome: Progressing   Problem: Health  Behavior/Discharge Planning: Goal: Identification of resources available to assist in meeting health care needs will improve Outcome: Progressing Goal: Compliance with treatment plan for underlying cause of condition will improve Outcome: Progressing   Problem: Physical Regulation: Goal: Ability to maintain clinical measurements within normal limits will improve Outcome: Progressing   Problem: Safety: Goal: Periods of time without injury will increase Outcome: Progressing   Problem: Education: Goal: Utilization of techniques to improve thought processes will improve Outcome: Progressing Goal: Knowledge of the prescribed therapeutic regimen will improve Outcome: Progressing   Problem: Activity: Goal: Interest or engagement in leisure activities will improve Outcome: Progressing Goal: Imbalance in normal sleep/wake cycle will improve Outcome: Progressing   Problem: Coping: Goal: Coping ability will improve Outcome: Progressing Goal: Will verbalize feelings Outcome: Progressing   Problem: Health Behavior/Discharge Planning: Goal: Ability to make decisions will improve Outcome: Progressing Goal: Compliance with therapeutic regimen will improve Outcome: Progressing   Problem: Role Relationship: Goal: Will demonstrate positive changes in social behaviors and relationships Outcome: Progressing   Problem: Safety: Goal: Ability to disclose and discuss suicidal ideas will improve Outcome: Progressing Goal: Ability to identify and utilize support systems that promote safety will improve Outcome: Progressing   Problem: Self-Concept: Goal: Will verbalize positive feelings about self Outcome: Progressing Goal: Level of anxiety will decrease Outcome: Progressing   Problem: Activity: Goal: Will identify at least one activity in which they can participate Outcome: Progressing   Problem: Coping: Goal: Ability to identify and develop effective coping behavior will  improve Outcome: Progressing Goal: Ability to interact with others will improve Outcome: Progressing Goal: Demonstration of participation in decision-making regarding own care will improve Outcome: Progressing Goal: Ability to use eye contact when communicating with others will improve Outcome: Progressing   Problem: Health Behavior/Discharge Planning: Goal: Identification of resources available to assist in meeting health care needs will improve Outcome: Progressing   Problem: Self-Concept: Goal: Will verbalize positive feelings about self Outcome: Progressing

## 2023-10-16 MED ORDER — QUETIAPINE FUMARATE 200 MG PO TABS
200.0000 mg | ORAL_TABLET | Freq: Every day | ORAL | Status: DC
  Administered 2023-10-16 – 2023-10-18 (×3): 200 mg via ORAL
  Filled 2023-10-16 (×3): qty 1

## 2023-10-16 NOTE — Group Note (Signed)
Recreation Therapy Group Note   Group Topic:Goal Setting  Group Date: 10/16/2023 Start Time: 1030 End Time: 1135 Facilitators: Rosina Lowenstein, LRT, CTRS Location:  Craft Room  Group Description: Vision Boards. Patients were given many different magazines, a glue stick, markers, and a piece of cardstock paper. LRT and pts discussed the importance of having goals in life. LRT and pts discussed the difference between short-term and long-term goals, as well as what a SMART goal is. LRT encouraged pts to create a vision board, with images they picked and then cut out with safety scissors from the magazine, for themselves, that capture their short and long-term goals. LRT encouraged pts to show and explain their vision board to the group.   Goal Area(s) Addressed:  Patient will gain knowledge of short vs. long term goals.  Patient will identify goals for themselves. Patient will practice setting SMART goals. Patient will verbalize their goals to LRT and peers.   Affect/Mood: Appropriate   Participation Level: Active and Engaged   Participation Quality: Independent   Behavior: Appropriate, Calm, and Cooperative   Speech/Thought Process: Coherent   Insight: Good   Judgement: Good   Modes of Intervention: Art   Patient Response to Interventions:  Attentive, Engaged, Interested , and Receptive   Education Outcome:  Acknowledges education   Clinical Observations/Individualized Feedback: Jeremiah Newman was active in their participation of session activities and group discussion. Pt identified "I want to get a job, make money, and spend time with my family since I am a family man" as his goals. Pt interacted well with LRT and peers duration of session.    Plan: Continue to engage patient in RT group sessions 2-3x/week.   Rosina Lowenstein, LRT, CTRS 10/16/2023 12:29 PM

## 2023-10-16 NOTE — Group Note (Signed)
Date:  10/16/2023 Time:  10:29 AM  Group Topic/Focus:  Goals Group:   The focus of this group is to help patients establish daily goals to achieve during treatment and discuss how the patient can incorporate goal setting into their daily lives to aide in recovery.    Participation Level:  Active  Participation Quality:  Appropriate  Affect:  Appropriate  Cognitive:  Appropriate  Insight: Appropriate  Engagement in Group:  Engaged  Modes of Intervention:  Discussion, Education, and Support  Additional Comments:    Wilford Corner 10/16/2023, 10:29 AM

## 2023-10-16 NOTE — Group Note (Signed)
Date:  10/16/2023 Time:  6:07 PM  Group Topic/Focus:  Rediscovering Joy:   The focus of this group is to explore various ways to relieve stress in a positive manner. OUTDOOR RECREATION STRUCTURED ACTIVITY    Participation Level:  Active  Participation Quality:  Appropriate  Affect:  Appropriate  Cognitive:  Alert, Appropriate, and Oriented  Insight: Appropriate  Engagement in Group:  Improving  Modes of Intervention:  Activity and Socialization  Additional Comments:    Rosaura Carpenter 10/16/2023, 6:07 PM

## 2023-10-16 NOTE — Progress Notes (Signed)
   10/15/23 2000  Psych Admission Type (Psych Patients Only)  Admission Status Voluntary  Psychosocial Assessment  Patient Complaints None  Eye Contact Fair  Facial Expression Worried  Affect Anxious  Speech Logical/coherent  Interaction Assertive  Motor Activity Slow  Appearance/Hygiene Unremarkable  Behavior Characteristics Appropriate to situation  Mood Pleasant  Thought Process  Coherency WDL  Content WDL  Delusions None reported or observed  Perception WDL  Hallucination None reported or observed  Judgment WDL  Confusion WDL  Danger to Self  Current suicidal ideation? Denies  Agreement Not to Harm Self Yes  Description of Agreement verbal  Danger to Others  Danger to Others None reported or observed  Danger to Others Abnormal  Harmful Behavior to others No threats or harm toward other people  Destructive Behavior No threats or harm toward property

## 2023-10-16 NOTE — Progress Notes (Signed)
[facility] MD Progress Note  10/16/2023 4:04 PM Jeremiah Newman  MRN:  [id number] Subjective:  32 year old Caucasian male, reports, "I am having thoughts of hurting myself," and describes experiencing intrusive thoughts. He states that his current medication "is not working" and reports auditory hallucinations of whispers. The patient denies delusions but expresses significant distress related to his current mental state.The patient is experiencing worsening symptoms of depression and psychosis, including suicidal ideation (SI), intrusive thoughts, and auditory hallucinations. His report that medication is not working suggests potential nonresponse or nonadherence, requiring immediate intervention to stabilize his condition. Principal Problem: Major depressive disorder, recurrent episode, severe, with psychosis (HCC) Diagnosis: Principal Problem:   Major depressive disorder, recurrent episode, severe, with psychosis (HCC) Active Problems:   PTSD (post-traumatic stress disorder)   Insomnia  Total Time spent with patient: 1 hour  Past Psychiatric History: PTSD Depression  Past Medical History: History reviewed. No pertinent past medical history. History reviewed. No pertinent surgical history. Family History: History reviewed. No pertinent family history. Family Psychiatric  History: Mother Substance Abuse Social History:  Social History   Substance and Sexual Activity  Alcohol Use Yes     Social History   Substance and Sexual Activity  Drug Use No    Social History   Socioeconomic History   Marital status: Single    Spouse name: Not on file   Number of children: Not on file   Years of education: Not on file   Highest education level: Not on file  Occupational History   Not on file  Tobacco Use   Smoking status: Some Days    Current packs/day: 1.00    Average packs/day: 1 pack/day for 8.9 years (8.9 ttl pk-yrs)    Types: Cigarettes    Start date: 2016   Smokeless tobacco: Never   Substance and Sexual Activity   Alcohol use: Yes   Drug use: No   Sexual activity: Not Currently  Other Topics Concern   Not on file  Social History Narrative   Not on file   Social Determinants of Health   Financial Resource Strain: Not on file  Food Insecurity: No Food Insecurity (10/09/2023)   Hunger Vital Sign    Worried About Running Out of Food in the Last Year: Never true    Ran Out of Food in the Last Year: Never true  Transportation Needs: No Transportation Needs (10/09/2023)   PRAPARE - Administrator, Civil Service (Medical): No    Lack of Transportation (Non-Medical): No  Physical Activity: Not on file  Stress: Not on file  Social Connections: Not on file   Additional Social History:                         Sleep: Fair  Appetite:  Fair  Current Medications: Current Facility-Administered Medications  Medication Dose Route Frequency Provider Last Rate Last Admin   acetaminophen (TYLENOL) tablet 650 mg  650 mg Oral Q6H PRN [name], NP   650 mg at 10/16/23 1035   alum & mag hydroxide-simeth (MAALOX/MYLANTA) 200-200-20 MG/5ML suspension 30 mL  30 mL Oral Q4H PRN [name], NP       diphenhydrAMINE (BENADRYL) capsule 50 mg  50 mg Oral TID PRN [name], NP       Or   diphenhydrAMINE (BENADRYL) injection 50 mg  50 mg Intramuscular TID PRN [name], NP       escitalopram (LEXAPRO) tablet  20 mg  20 mg Oral Daily [name], NP   20 mg at 10/16/23 4540   gabapentin (NEURONTIN) capsule 300 mg  300 mg Oral TID Charm Rings, NP   300 mg at 10/16/23 1224   haloperidol (HALDOL) tablet 5 mg  5 mg Oral TID PRN [name], NP       Or   haloperidol lactate (HALDOL) injection 5 mg  5 mg Intramuscular TID PRN [name], NP       hydrOXYzine (ATARAX) tablet 25 mg  25 mg Oral Q6H PRN [name], NP   25 mg at 10/16/23 1035   LORazepam (ATIVAN) tablet 2 mg  2 mg Oral TID PRN [name], NP       Or    LORazepam (ATIVAN) injection 2 mg  2 mg Intramuscular TID PRN [name], NP       magnesium hydroxide (MILK OF MAGNESIA) suspension 30 mL  30 mL Oral Daily PRN [name], NP   30 mL at 10/15/23 0841   mirtazapine (REMERON) tablet 7.5 mg  7.5 mg Oral QHS Charm Rings, NP   7.5 mg at 10/15/23 2118   QUEtiapine (SEROQUEL) tablet 150 mg  150 mg Oral QHS Charm Rings, NP   150 mg at 10/15/23 2118   traZODone (DESYREL) tablet 100 mg  100 mg Oral QHS PRN Charm Rings, NP   100 mg at 10/13/23 2114    Lab Results: No results found for this or any previous visit (from the past 48 hour(s)).  Blood Alcohol level:  Lab Results  Component Value Date   ETH <10 10/08/2023   ETH 215 (H) 04/22/2017    Metabolic Disorder Labs: Lab Results  Component Value Date   HGBA1C 4.9 10/10/2023   MPG 93.93 10/10/2023   No results found for: "PROLACTIN" Lab Results  Component Value Date   CHOL 214 (H) 10/10/2023   TRIG 135 10/10/2023   HDL 41 10/10/2023   CHOLHDL 5.2 10/10/2023   VLDL 27 10/10/2023   LDLCALC 146 (H) 10/10/2023    Physical Findings: AIMS:  , ,  ,  ,    CIWA:    COWS:     Musculoskeletal: Strength & Muscle Tone: within normal limits Gait & Station: normal Patient leans: N/A  Psychiatric Specialty Exam:  Presentation  General Appearance:  Disheveled  Eye Contact: Minimal  Speech: Clear and Coherent; Normal Rate  Speech Volume: Normal  Handedness: Left   Mood and Affect  Mood: Anxious; Irritable  Affect: Flat   Thought Process  Thought Processes: Coherent; Linear  Descriptions of Associations:Intact  Orientation:Full (Time, Place and Person) (and situation)  Thought Content:Rumination  History of Schizophrenia/Schizoaffective disorder:No  Duration of Psychotic Symptoms:Less than six months (30 days)  Hallucinations:Hallucinations: Auditory Description of Auditory Hallucinations: "whispers"  Ideas of Reference:None  Suicidal  Thoughts:Suicidal Thoughts: Yes, Passive SI Passive Intent and/or Plan: Without Intent; Without Plan; Without Means to Carry Out; Without Access to Means  Homicidal Thoughts:Homicidal Thoughts: No   Sensorium  Memory: Immediate Fair; Remote Fair  Judgment: Fair  Insight: Fair   Executive Functions  Concentration: Good  Attention Span: Fair  Recall: Fair  Fund of Knowledge: Good  Language: Good   Psychomotor Activity  Psychomotor Activity: Psychomotor Activity: Normal   Assets  Assets: Communication Skills; Physical Health   Sleep  Sleep: Sleep: Fair Number of Hours of Sleep: 4    Physical Exam: Physical Exam  Psychiatric:        Attention and Perception: He perceives auditory hallucinations.        Mood and Affect: Mood is anxious. Affect is flat.        Speech: Speech normal.        Behavior: Behavior normal. Behavior is cooperative.        Thought Content: Thought content includes suicidal ideation.        Cognition and Memory: Cognition and memory normal.        Judgment: Judgment normal.    Review of Systems  Psychiatric/Behavioral:  Positive for hallucinations. The patient is nervous/anxious and has insomnia.   All other systems reviewed and are negative.  Blood pressure (!) 101/59, pulse 69, temperature 98.2 F (36.8 C), resp. rate 18, height 5\' 8"  (1.727 m), weight 79.4 kg, SpO2 98%. Body mass index is 26.61 kg/m.   Treatment Plan Summary: Daily contact with patient to assess and evaluate symptoms and progress in treatment and Medication management. Lexapro Increase dose to 20 mg daily to better manage anxiety and symptoms of PTSD  Seroquel (quetiapine): Increase to 200 mg nightly for mood stabilization and to assist with sleep. Remeron (mirtazapine): Continue at 7.5 mg nightly for sleep and mood support. Monitor for changes in thought content, particularly any escalation in suicidal ideation or worsening hallucinations. Conduct  regular mental status evaluations to assess response to interventions.  [name], NP 10/16/2023, 4:04 PM

## 2023-10-16 NOTE — Plan of Care (Signed)
  Problem: Education: Goal: Knowledge of [facility] Education information/materials will improve Outcome: Progressing   Problem: Activity: Goal: Interest or engagement in activities will improve Outcome: Progressing   

## 2023-10-16 NOTE — Group Note (Signed)
Date:  10/16/2023 Time:  10:05 PM  Group Topic/Focus:  Wrap-Up Group:   The focus of this group is to help patients review their daily goal of treatment and discuss progress on daily workbooks.    Participation Level:  Active  Participation Quality:  Appropriate and Attentive  Affect:  Appropriate  Cognitive:  Alert and Appropriate  Insight: Appropriate and Good  Engagement in Group:  Developing/Improving  Modes of Intervention:  Clarification, Rapport Building, and Support  Additional Comments:     Odilon Cass 10/16/2023, 10:05 PM

## 2023-10-16 NOTE — OR Nursing (Signed)
.  D- Patient alert and oriented. Patient presented in a pleasant mood. Denies SI, HI, AVH, and pain.  A- Scheduled night time medications administered to patient, per MD orders. Support and encouragement provided.  Routine safety checks conducted every 15 minutes.  Patient informed to notify staff with problems or concerns. R- No adverse drug reactions noted. Patient contracts for safety at this time. Patient compliant with medications and treatment plan. Patient receptive, calm, and cooperative. Patient interacts well with others on the unit.  Patient remains safe at this time.

## 2023-10-16 NOTE — Plan of Care (Signed)
Patient appropriate with staff & peers. Patient stated that he is having intrusive negative thoughts. Patient stated that he is not going to act on it here.Patient does not think the medicine is working. Patient visible in the milieu. Interacting with peers. Denies SI,HI and AVH at this time. Support and encouragement given.

## 2023-10-16 NOTE — [facility] Group Notes (Signed)
LCSW Wellness Group Note   10/16/2023 1:00pm  Type of Group and Topic: Psychoeducational Group:  Wellness  Participation Level:  did not attend  Description of Group  Wellness group introduces the topic and its focus on developing healthy habits across the spectrum and its relationship to a decrease in hospital admissions.  Six areas of wellness are discussed: physical, social spiritual, intellectual, occupational, and emotional.  Patients are asked to consider their current wellness habits and to identify areas of wellness where they are interested and able to focus on improvements.    Therapeutic Goals Patients will understand components of wellness and how they can positively impact overall health.  Patients will identify areas of wellness where they have developed good habits. Patients will identify areas of wellness where they would like to make improvements.    Summary of Patient Progress     Therapeutic Modalities: Cognitive Behavioral Therapy Psychoeducation    Lorri Frederick, LCSW

## 2023-10-17 DIAGNOSIS — F333 Major depressive disorder, recurrent, severe with psychotic symptoms: Secondary | ICD-10-CM | POA: Diagnosis not present

## 2023-10-17 MED ORDER — QUETIAPINE FUMARATE 200 MG PO TABS
200.0000 mg | ORAL_TABLET | Freq: Every day | ORAL | 1 refills | Status: DC
  Filled 2023-10-17: qty 30, 30d supply, fill #0

## 2023-10-17 MED ORDER — MIRTAZAPINE 7.5 MG PO TABS
7.5000 mg | ORAL_TABLET | Freq: Every day | ORAL | 1 refills | Status: DC
  Filled 2023-10-17: qty 30, 30d supply, fill #0

## 2023-10-17 MED ORDER — HYDROXYZINE HCL 25 MG PO TABS
25.0000 mg | ORAL_TABLET | Freq: Three times a day (TID) | ORAL | 0 refills | Status: DC | PRN
  Filled 2023-10-17: qty 30, 10d supply, fill #0

## 2023-10-17 MED ORDER — ESCITALOPRAM OXALATE 20 MG PO TABS
20.0000 mg | ORAL_TABLET | Freq: Every day | ORAL | 1 refills | Status: DC
  Filled 2023-10-17: qty 30, 30d supply, fill #0

## 2023-10-17 MED ORDER — GABAPENTIN 300 MG PO CAPS
300.0000 mg | ORAL_CAPSULE | Freq: Three times a day (TID) | ORAL | 0 refills | Status: DC
  Filled 2023-10-17: qty 90, 30d supply, fill #0

## 2023-10-17 MED ORDER — TRAZODONE HCL 100 MG PO TABS
100.0000 mg | ORAL_TABLET | Freq: Every evening | ORAL | 0 refills | Status: DC | PRN
  Filled 2023-10-17: qty 30, 30d supply, fill #0

## 2023-10-17 NOTE — Plan of Care (Signed)
  Problem: Education: Goal: Knowledge of [facility] Education information/materials will improve Outcome: Progressing Goal: Verbalization of understanding the information provided will improve Outcome: Progressing   Problem: Activity: Goal: Interest or engagement in activities will improve Outcome: Progressing Goal: Sleeping patterns will improve Outcome: Progressing

## 2023-10-17 NOTE — Group Note (Signed)
LCSW Group Therapy Note   Group Date: 10/17/2023 Start Time: 1330 End Time: 1430   Type of Therapy and Topic:  Group Therapy: Challenging Core Beliefs  Participation Level:  Did Not Attend  Description of Group:  Patients were educated about core beliefs and asked to identify one harmful core belief that they have. Patients were asked to explore from where those beliefs originate. Patients were asked to discuss how those beliefs make them feel and the resulting behaviors of those beliefs. They were then be asked if those beliefs are true and, if so, what evidence they have to support them. Lastly, group members were challenged to replace those negative core beliefs with helpful beliefs.   Therapeutic Goals:   1. Patient will identify harmful core beliefs and explore the origins of such beliefs. 2. Patient will identify feelings and behaviors that result from those core beliefs. 3. Patient will discuss whether such beliefs are true. 4.  Patient will replace harmful core beliefs with helpful ones.  Summary of Patient Progress:  Patient did not attend.   Therapeutic Modalities: Cognitive Behavioral Therapy; Solution-Focused Therapy   Lowry Ram, LCSWA 10/17/2023  2:21 PM

## 2023-10-17 NOTE — B[facility] Counselor (Signed)
CSW followed up with [facility].  No confirmation on it patient was accepted, however [facility] staff will contact this Clinical research associate for additional information.  CSW sent fax [facility].  CSW to follow up.  Penni Homans, MSW, LCSW 10/17/2023 12:47 PM

## 2023-10-17 NOTE — Group Note (Signed)
Date:  10/17/2023 Time:  10:01 AM  Group Topic/Focus:  Goals Group:   The focus of this group is to help patients establish daily goals to achieve during treatment and discuss how the patient can incorporate goal setting into their daily lives to aide in recovery.    Participation Level:  Active  Participation Quality:  Appropriate  Affect:  Appropriate  Cognitive:  Appropriate  Insight: Appropriate  Engagement in Group:  Engaged  Modes of Intervention:  Discussion, Education, and Support  Additional Comments:    Wilford Corner 10/17/2023, 10:01 AM

## 2023-10-17 NOTE — Progress Notes (Signed)
Pt BP was low when the MHT checked it. Pt was not symptomatic and was rechecked with a higher BP. Pt given fluids, and will continue to be monitored

## 2023-10-17 NOTE — Group Note (Signed)
Recreation Therapy Group Note   Group Topic:Relaxation  Group Date: 10/17/2023 Start Time: 1000 End Time: 1045 Facilitators: Rosina Lowenstein, LRT, CTRS Location:  C[facility]  Group Description: PMR (Progressive Muscle Relaxation). LRT asks patients their current level of stress/anxiety from 1-10, with 10 being the highest. LRT educates patients on what PMR is and the benefits that come from it. Patients are asked to sit with their feet flat on the floor while sitting up and all the way back in their chair, if possible. LRT and pts follow a prompt through a speaker that requires you to tense and release different muscles in their body and focus on their breathing. During session, lights are off and soft music is being played. Pts are given a stress ball to use if needed. At the end of the prompt, LRT asks patients to rank their current levels of stress/anxiety from 1-10, 10 being the highest. LRT provides patients with an education handout on PMR.   Goal Area(s) Addressed:  Patients will be able to describe progressive muscle relaxation.  Patient will practice using relaxation technique. Patient will identify a new coping skill.  Patient will follow multistep directions to reduce anxiety and stress.   Affect/Mood: Full range   Participation Level: Active   Participation Quality: Minimal Cues   Behavior: Cooperative   Speech/Thought Process: Coherent   Insight: Fair   Judgement: Fair    Modes of Intervention: Activity   Patient Response to Interventions:  Receptive   Education Outcome:  Acknowledges education   Clinical Observations/Individualized Feedback: [place] was active in their participation of session activities and group discussion. Pt identified that his anxiety was a 7 and stress was a 4 before the session. Pt shared that afterwards, his anxiety was a 5 and stress was a 2. Pt required minimal cues to stop talking and not to bounce the stress ball as it was being a  distraction to others.    Plan: Continue to engage patient in RT group sessions 2-3x/week.   Rosina Lowenstein, LRT, CTRS 10/17/2023 11:27 AM

## 2023-10-17 NOTE — Progress Notes (Signed)
Patient is a voluntary admission to [facility] for MDD. Patient is calm, cooperative, friendly and interactive with staff and peers. Plan is to discharge tomorrow to with the NP speaking with pharmacy to get his meds together for discharge to take with him. Patient denies SI, HI, AVH, anxiety and depression.  Will continue to monitor and offer support.

## 2023-10-17 NOTE — Group Note (Signed)
Date:  10/17/2023 Time:  9:02 PM  Group Topic/Focus:  Wrap-Up Group:   The focus of this group is to help patients review their daily goal of treatment and discuss progress on daily workbooks.    Participation Level:  Active  Participation Quality:  Appropriate, Attentive, Sharing, and Supportive  Affect:  Appropriate  Cognitive:  Appropriate  Insight: Appropriate and Good  Engagement in Group:  Supportive  Modes of Intervention:  Discussion and Support  Additional Comments:     [name] 10/17/2023, 9:02 PM

## 2023-10-17 NOTE — Progress Notes (Signed)
[facility] MD Progress Note  10/17/2023 10:30 AM Jeremiah Newman  MRN:  [id number] Subjective:  32 year old Caucasian male reports, "I am feeling a lot better today." They deny suicidal ideation (SI), homicidal ideation (HI), self-injurious behavior (SIB), hallucinations, or delusions.The client is showing improvement in mood and denies any acute psychiatric symptoms such as SI, HI, SIB, or psychosis. The client appears to be stabilizing. Principal Problem: Major depressive disorder, recurrent episode, severe, with psychosis (HCC) Diagnosis: Principal Problem:   Major depressive disorder, recurrent episode, severe, with psychosis (HCC) Active Problems:   PTSD (post-traumatic stress disorder)   Insomnia  Total Time spent with patient: 45 minutes  Past Psychiatric History: Anxiety Polysubstance Abise  Past Medical History: History reviewed. No pertinent past medical history. History reviewed. No pertinent surgical history. Family History: History reviewed. No pertinent family history. Family Psychiatric  History: Mother Depression Social History:  Social History   Substance and Sexual Activity  Alcohol Use Yes     Social History   Substance and Sexual Activity  Drug Use No    Social History   Socioeconomic History   Marital status: Single    Spouse name: Not on file   Number of children: Not on file   Years of education: Not on file   Highest education level: Not on file  Occupational History   Not on file  Tobacco Use   Smoking status: Some Days    Current packs/day: 1.00    Average packs/day: 1 pack/day for 8.9 years (8.9 ttl pk-yrs)    Types: Cigarettes    Start date: 2016   Smokeless tobacco: Never  Substance and Sexual Activity   Alcohol use: Yes   Drug use: No   Sexual activity: Not Currently  Other Topics Concern   Not on file  Social History Narrative   Not on file   Social Determinants of Health   Financial Resource Strain: Not on file  Food Insecurity: No  Food Insecurity (10/09/2023)   Hunger Vital Sign    Worried About Running Out of Food in the Last Year: Never true    Ran Out of Food in the Last Year: Never true  Transportation Needs: No Transportation Needs (10/09/2023)   PRAPARE - Administrator, Civil Service (Medical): No    Lack of Transportation (Non-Medical): No  Physical Activity: Not on file  Stress: Not on file  Social Connections: Not on file   Additional Social History:                         Sleep: Good  Appetite:  Good  Current Medications: Current Facility-Administered Medications  Medication Dose Route Frequency Provider Last Rate Last Admin   acetaminophen (TYLENOL) tablet 650 mg  650 mg Oral Q6H PRN [name], NP   650 mg at 10/16/23 1035   alum & mag hydroxide-simeth (MAALOX/MYLANTA) 200-200-20 MG/5ML suspension 30 mL  30 mL Oral Q4H PRN [name], NP       diphenhydrAMINE (BENADRYL) capsule 50 mg  50 mg Oral TID PRN [name], NP       Or   diphenhydrAMINE (BENADRYL) injection 50 mg  50 mg Intramuscular TID PRN [name], NP       escitalopram (LEXAPRO) tablet 20 mg  20 mg Oral Daily [name], NP   20 mg at 10/17/23 0835   gabapentin (NEURONTIN) capsule 300 mg  300 mg Oral TID Nanine Means  Y, NP   300 mg at 10/17/23 1722   haloperidol (HALDOL) tablet 5 mg  5 mg Oral TID PRN [name], NP       Or   haloperidol lactate (HALDOL) injection 5 mg  5 mg Intramuscular TID PRN [name], NP       hydrOXYzine (ATARAX) tablet 25 mg  25 mg Oral Q6H PRN [name], NP   25 mg at 10/16/23 1035   LORazepam (ATIVAN) tablet 2 mg  2 mg Oral TID PRN [name], NP       Or   LORazepam (ATIVAN) injection 2 mg  2 mg Intramuscular TID PRN [name], NP       magnesium hydroxide (MILK OF MAGNESIA) suspension 30 mL  30 mL Oral Daily PRN [name], NP   30 mL at 10/15/23 0841   mirtazapine (REMERON) tablet 7.5 mg  7.5 mg Oral QHS Charm Rings, NP   7.5 mg at 10/16/23 2113   QUEtiapine (SEROQUEL) tablet 200 mg  200 mg Oral QHS [name], NP   200 mg at 10/16/23 2113   traZODone (DESYREL) tablet 100 mg  100 mg Oral QHS PRN Charm Rings, NP   100 mg at 10/13/23 2114    Lab Results: No results found for this or any previous visit (from the past 48 hour(s)).  Blood Alcohol level:  Lab Results  Component Value Date   ETH <10 10/08/2023   ETH 215 (H) 04/22/2017    Metabolic Disorder Labs: Lab Results  Component Value Date   HGBA1C 4.9 10/10/2023   MPG 93.93 10/10/2023   No results found for: "PROLACTIN" Lab Results  Component Value Date   CHOL 214 (H) 10/10/2023   TRIG 135 10/10/2023   HDL 41 10/10/2023   CHOLHDL 5.2 10/10/2023   VLDL 27 10/10/2023   LDLCALC 146 (H) 10/10/2023    Physical Findings: AIMS:  , ,  ,  ,    CIWA:    COWS:     Musculoskeletal: Strength & Muscle Tone: within normal limits Gait & Station: normal Patient leans: N/A  Psychiatric Specialty Exam:  Presentation  General Appearance:  Disheveled  Eye Contact: Minimal  Speech: Clear and Coherent; Normal Rate  Speech Volume: Normal  Handedness: Left   Mood and Affect  Mood: Anxious; Irritable  Affect: Flat   Thought Process  Thought Processes: Coherent; Linear  Descriptions of Associations:Intact  Orientation:Full (Time, Place and Person) (and situation)  Thought Content:Rumination  History of Schizophrenia/Schizoaffective disorder:No  Duration of Psychotic Symptoms:Less than six months (30 days)  Hallucinations:Hallucinations: Auditory Description of Auditory Hallucinations: "whispers"  Ideas of Reference:None  Suicidal Thoughts:Suicidal Thoughts: Yes, Passive SI Passive Intent and/or Plan: Without Intent; Without Plan; Without Means to Carry Out; Without Access to Means  Homicidal Thoughts:Homicidal Thoughts: No   Sensorium  Memory: Immediate Fair; Remote  Fair  Judgment: Fair  Insight: Fair   Executive Functions  Concentration: Good  Attention Span: Fair  Recall: Fair  Fund of Knowledge: Good  Language: Good   Psychomotor Activity  Psychomotor Activity: Psychomotor Activity: Normal   Assets  Assets: Communication Skills; Physical Health   Sleep  Sleep: Sleep: Fair Number of Hours of Sleep: 4    Physical Exam: Physical Exam Vitals and nursing note reviewed.  Constitutional:      Appearance: Normal appearance.  HENT:     Head: Normocephalic and atraumatic.     Nose: Nose normal.  Pulmonary:     Effort: Pulmonary effort is normal.  Musculoskeletal:        General: Normal range of motion.  Neurological:     General: No focal deficit present.     Mental Status: He is alert and oriented to person, place, and time.  Psychiatric:        Attention and Perception: Attention and perception normal.        Mood and Affect: Mood and affect normal.        Speech: Speech normal.        Behavior: Behavior normal. Behavior is cooperative.        Thought Content: Thought content normal.        Cognition and Memory: Cognition and memory normal.        Judgment: Judgment normal.    Review of Systems  All other systems reviewed and are negative.  Blood pressure 125/70, pulse 66, temperature 97.7 F (36.5 C), temperature source Oral, resp. rate 18, height 5\' 8"  (1.727 m), weight 79.4 kg, SpO2 99%. Body mass index is 26.61 kg/m.   Treatment Plan Summary: Daily contact with patient to assess and evaluate symptoms and progress in treatment and Medication management Lexapro Increase 20 mg daily to better manage anxiety and symptoms of PTSD  Seroquel (quetiapine): Increase to 200 mg nightly for mood stabilization and to assist with sleep. Remeron (mirtazapine): Continue at 7.5 mg nightly for sleep and mood support [name], NP 10/17/2023, 9:07 PM

## 2023-10-17 NOTE — Group Note (Signed)
LCSW Group Therapy Note  Group Date: 10/17/2023 Start Time: 1100 End Time: 1200   Type of Therapy and Topic:  Group Therapy - Healthy vs Unhealthy Coping Skills  Participation Level:  Active   Description of Group The focus of this group was to determine what unhealthy coping techniques typically are used by group members and what healthy coping techniques would be helpful in coping with various problems. Patients were guided in becoming aware of the differences between healthy and unhealthy coping techniques. Patients were asked to identify 2-3 healthy coping skills they would like to learn to use more effectively.  Therapeutic Goals Patients learned that coping is what human beings do all day long to deal with various situations in their lives Patients defined and discussed healthy vs unhealthy coping techniques Patients identified their preferred coping techniques and identified whether these were healthy or unhealthy Patients determined 2-3 healthy coping skills they would like to become more familiar with and use more often. Patients provided support and ideas to each other   Summary of Patient Progress:  Did not attend   Therapeutic Modalities Cognitive Behavioral Therapy Motivational Interviewing  Marinda Elk, [place] 10/17/2023  1:39 PM

## 2023-10-18 ENCOUNTER — Other Ambulatory Visit: Payer: Self-pay

## 2023-10-18 MED ORDER — LORAZEPAM 0.5 MG PO TABS
0.5000 mg | ORAL_TABLET | Freq: Once | ORAL | Status: AC
  Administered 2023-10-18: 0.5 mg via ORAL
  Filled 2023-10-18: qty 1

## 2023-10-18 NOTE — Progress Notes (Signed)
Patient was pleasant and cooperative on the shift. She denies SI, HI & AVH. He was visible in the milieu. No new behavioral issues to report through out the night. Patient seemed to sleep well through out the night.

## 2023-10-18 NOTE — Progress Notes (Signed)
   10/18/23 1311  Psych Admission Type (Psych Patients Only)  Admission Status Voluntary  Psychosocial Assessment  Patient Complaints None  Eye Contact Brief  Facial Expression Flat  Affect Appropriate to circumstance  Speech Logical/coherent  Interaction Assertive  Motor Activity Slow  Appearance/Hygiene Unremarkable  Behavior Characteristics Cooperative  Mood Pleasant  Thought Process  Coherency WDL  Content WDL  Delusions None reported or observed  Perception WDL  Hallucination None reported or observed  Judgment WDL  Confusion WDL  Danger to Self  Current suicidal ideation? Denies  Agreement Not to Harm Self Yes  Danger to Others  Danger to Others None reported or observed  Danger to Others Abnormal  Harmful Behavior to others No threats or harm toward other people  Destructive Behavior No threats or harm toward property

## 2023-10-18 NOTE — Progress Notes (Signed)
[facility] MD Progress Note  10/18/2023 3:39 PM Jeremiah Newman  MRN:  [id number] Subjective:   32 year old Caucasian male, reports, "I am getting extremely anxious. I feel like I am going to hurt myself." He acknowledges ideas of self-injury and suicidal ideation but denies having a specific plan. The patient denies delusions or hallucinations.The patient is experiencing acute anxiety and suicidal ideation, though he denies having a specific plan. He denies psychotic symptoms, including delusions or hallucinations. His heightened distress necessitates immediate intervention to reduce anxiety and ensure safety. Principal Problem: Major depressive disorder, recurrent episode, severe, with psychosis (HCC) Diagnosis: Principal Problem:   Major depressive disorder, recurrent episode, severe, with psychosis (HCC) Active Problems:   PTSD (post-traumatic stress disorder)   Insomnia  Total Time spent with patient: 45 minutes  Past Psychiatric History: Anxiety  Past Medical History: History reviewed. No pertinent past medical history. History reviewed. No pertinent surgical history. Family History: History reviewed. No pertinent family history. Family Psychiatric  History: none reported Social History:  Social History   Substance and Sexual Activity  Alcohol Use Yes     Social History   Substance and Sexual Activity  Drug Use No    Social History   Socioeconomic History   Marital status: Single    Spouse name: Not on file   Number of children: Not on file   Years of education: Not on file   Highest education level: Not on file  Occupational History   Not on file  Tobacco Use   Smoking status: Some Days    Current packs/day: 1.00    Average packs/day: 1 pack/day for 8.9 years (8.9 ttl pk-yrs)    Types: Cigarettes    Start date: 2016   Smokeless tobacco: Never  Substance and Sexual Activity   Alcohol use: Yes   Drug use: No   Sexual activity: Not Currently  Other Topics Concern    Not on file  Social History Narrative   Not on file   Social Determinants of Health   Financial Resource Strain: Not on file  Food Insecurity: No Food Insecurity (10/09/2023)   Hunger Vital Sign    Worried About Running Out of Food in the Last Year: Never true    Ran Out of Food in the Last Year: Never true  Transportation Needs: No Transportation Needs (10/09/2023)   PRAPARE - Administrator, Civil Service (Medical): No    Lack of Transportation (Non-Medical): No  Physical Activity: Not on file  Stress: Not on file  Social Connections: Not on file   Additional Social History:                         Sleep: Fair  Appetite:  Fair  Current Medications: Current Facility-Administered Medications  Medication Dose Route Frequency Provider Last Rate Last Admin   acetaminophen (TYLENOL) tablet 650 mg  650 mg Oral Q6H PRN [name], NP   650 mg at 10/16/23 1035   alum & mag hydroxide-simeth (MAALOX/MYLANTA) 200-200-20 MG/5ML suspension 30 mL  30 mL Oral Q4H PRN [name], NP       diphenhydrAMINE (BENADRYL) capsule 50 mg  50 mg Oral TID PRN [name], NP       Or   diphenhydrAMINE (BENADRYL) injection 50 mg  50 mg Intramuscular TID PRN [name], NP       escitalopram (LEXAPRO) tablet 20 mg  20 mg Oral Daily [name],  NP   20 mg at 10/18/23 0908   gabapentin (NEURONTIN) capsule 300 mg  300 mg Oral TID Charm Rings, NP   300 mg at 10/18/23 1435   haloperidol (HALDOL) tablet 5 mg  5 mg Oral TID PRN [name], NP       Or   haloperidol lactate (HALDOL) injection 5 mg  5 mg Intramuscular TID PRN [name], NP       hydrOXYzine (ATARAX) tablet 25 mg  25 mg Oral Q6H PRN [name], NP   25 mg at 10/18/23 1435   LORazepam (ATIVAN) tablet 2 mg  2 mg Oral TID PRN [name], NP       Or   LORazepam (ATIVAN) injection 2 mg  2 mg Intramuscular TID PRN [name], NP       magnesium hydroxide (MILK OF MAGNESIA)  suspension 30 mL  30 mL Oral Daily PRN [name], NP   30 mL at 10/15/23 0841   mirtazapine (REMERON) tablet 7.5 mg  7.5 mg Oral QHS Charm Rings, NP   7.5 mg at 10/17/23 2112   QUEtiapine (SEROQUEL) tablet 200 mg  200 mg Oral QHS [name], NP   200 mg at 10/17/23 2113   traZODone (DESYREL) tablet 100 mg  100 mg Oral QHS PRN Charm Rings, NP   100 mg at 10/13/23 2114    Lab Results: No results found for this or any previous visit (from the past 48 hour(s)).  Blood Alcohol level:  Lab Results  Component Value Date   ETH <10 10/08/2023   ETH 215 (H) 04/22/2017    Metabolic Disorder Labs: Lab Results  Component Value Date   HGBA1C 4.9 10/10/2023   MPG 93.93 10/10/2023   No results found for: "PROLACTIN" Lab Results  Component Value Date   CHOL 214 (H) 10/10/2023   TRIG 135 10/10/2023   HDL 41 10/10/2023   CHOLHDL 5.2 10/10/2023   VLDL 27 10/10/2023   LDLCALC 146 (H) 10/10/2023    Physical Findings: AIMS:  , ,  ,  ,    CIWA:    COWS:     Musculoskeletal: Strength & Muscle Tone: within normal limits Gait & Station: normal Patient leans: N/A  Psychiatric Specialty Exam:  Presentation  General Appearance:  Appropriate for Environment  Eye Contact: Good  Speech: Clear and Coherent; Normal Rate  Speech Volume: Normal  Handedness: Right   Mood and Affect  Mood: Euthymic  Affect: Congruent; Appropriate   Thought Process  Thought Processes: Coherent  Descriptions of Associations:Intact  Orientation:Full (Time, Place and Person) (and situation)  Thought Content:WDL  History of Schizophrenia/Schizoaffective disorder:No  Duration of Psychotic Symptoms:N/A  Hallucinations:Hallucinations: None Description of Auditory Hallucinations: denies  Ideas of Reference:None  Suicidal Thoughts:Suicidal Thoughts: No SI Passive Intent and/or Plan: -- (denies)  Homicidal Thoughts:No data recorded  Sensorium  Memory: Immediate Good;  Remote Good  Judgment: Fair  Insight: Fair   Art therapist  Concentration: Good  Attention Span: Fair  Recall: Good  Fund of Knowledge: Good  Language: Good   Psychomotor Activity  Psychomotor Activity: Psychomotor Activity: Normal   Assets  Assets: Desire for Improvement; Communication Skills; Physical Health   Sleep  Sleep: Sleep: Good Number of Hours of Sleep: 7    Physical Exam: Physical Exam Vitals and nursing note reviewed.  Constitutional:      Appearance: Normal appearance.  HENT:     Head: Normocephalic and  atraumatic.     Nose: Nose normal.  Pulmonary:     Effort: Pulmonary effort is normal.  Musculoskeletal:        General: Normal range of motion.     Cervical back: Normal range of motion.  Neurological:     General: No focal deficit present.     Mental Status: He is alert and oriented to person, place, and time.  Psychiatric:        Attention and Perception: Attention and perception normal.        Mood and Affect: Mood is anxious. Affect is flat.        Speech: Speech normal.        Behavior: Behavior is withdrawn. Behavior is cooperative.        Thought Content: Thought content includes suicidal ideation.        Cognition and Memory: Cognition and memory normal.        Judgment: Judgment normal.    Review of Systems  Psychiatric/Behavioral:  Positive for suicidal ideas. The patient is nervous/anxious.   All other systems reviewed and are negative.  Blood pressure (!) 100/51, pulse 69, temperature (!) 97.5 F (36.4 C), resp. rate 18, height 5\' 8"  (1.727 m), weight 79.4 kg, SpO2 97%. Body mass index is 26.61 kg/m.   Treatment Plan Summary: Daily contact with patient to assess and evaluate symptoms and progress in treatment and Medication management Lexapro Increase 20 mg daily to better manage anxiety and symptoms of PTSD  Seroquel (quetiapine): Increase to 200 mg nightly for mood stabilization and to assist with  sleep. Remeron (mirtazapine): Continue at 7.5 mg nightly for sleep and mood support Ativan (Lorazepam) 0.5 mg, one-time dose to manage acute anxiety. [name], NP 10/18/2023, 3:39 PM

## 2023-10-18 NOTE — Group Note (Signed)
Date:  10/18/2023 Time:  11:36 PM  Group Topic/Focus:  Wrap-Up Group:   The focus of this group is to help patients review their daily goal of treatment and discuss progress on daily workbooks.    Participation Level:  Active  Participation Quality:  Appropriate  Affect:  Appropriate  Cognitive:  Appropriate  Insight: Appropriate  Engagement in Group:  Engaged  Modes of Intervention:  Discussion Lenore Cordia 10/18/2023, 11:36 PM

## 2023-10-18 NOTE — Group Note (Unsigned)
Date:  10/18/2023 Time:  11:19 PM  Group Topic/Focus:  Watch Movie     Participation Level:  {BHH PARTICIPATION HQION:62952}  Participation Quality:  {BHH PARTICIPATION QUALITY:22265}  Affect:  {BHH AFFECT:22266}  Cognitive:  {BHH COGNITIVE:22267}  Insight: {BHH Insight2:20797}  Engagement in Group:  {BHH ENGAGEMENT IN GROUP:22268}  Modes of Intervention:  {BHH MODES OF INTERVENTION:22269}  Additional Comments:  ***  Lenore Cordia 10/18/2023, 11:19 PM

## 2023-10-18 NOTE — Plan of Care (Signed)
  Problem: Activity: Goal: Interest or engagement in activities will improve Outcome: Progressing   Problem: Coping: Goal: Ability to demonstrate self-control will improve Outcome: Progressing   Problem: Coping: Goal: Ability to verbalize frustrations and anger appropriately will improve Outcome: Progressing

## 2023-10-18 NOTE — BHH Counselor (Addendum)
ADDENDUM CSW contacted [facility] again and spoke with Summer.  Summer reports that she is alone in the office until "Tuesday of next week so I haven't reviewed anything".  She requests that CSW follow up with her on Monday around 2PM.  Penni Homans, MSW, LCSW 10/18/2023 11:32 AM      CSW attempted to contact [facility].  CSW left HIPAA compliant voicemail with Summer requesting a return call.  Penni Homans, MSW, LCSW 10/18/2023 11:01 AM

## 2023-10-18 NOTE — Group Note (Signed)
Date:  10/18/2023 Time:  10:04 AM  Group Topic/Focus:  Spirituality:   The focus of this group is to discuss how one's spirituality can aide in recovery.    Participation Level:  Did Not Attend   Jeremiah Newman 10/18/2023, 10:04 AM

## 2023-10-18 NOTE — Plan of Care (Signed)
  Problem: Education: Goal: Knowledge of [facility] Education information/materials will improve Outcome: Progressing Goal: Emotional status will improve Outcome: Progressing Goal: Mental status will improve Outcome: Progressing Goal: Verbalization of understanding the information provided will improve Outcome: Progressing   Problem: Activity: Goal: Interest or engagement in activities will improve Outcome: Progressing Goal: Sleeping patterns will improve Outcome: Progressing   Problem: Coping: Goal: Ability to verbalize frustrations and anger appropriately will improve Outcome: Progressing Goal: Ability to demonstrate self-control will improve Outcome: Progressing   Problem: Health Behavior/Discharge Planning: Goal: Identification of resources available to assist in meeting health care needs will improve Outcome: Progressing Goal: Compliance with treatment plan for underlying cause of condition will improve Outcome: Progressing   Problem: Physical Regulation: Goal: Ability to maintain clinical measurements within normal limits will improve Outcome: Progressing   Problem: Safety: Goal: Periods of time without injury will increase Outcome: Progressing   Problem: Education: Goal: Knowledge of [facility] [place] [facility] Education information/materials will improve Outcome: Progressing Goal: Emotional status will improve Outcome: Progressing Goal: Mental status will improve Outcome: Progressing Goal: Verbalization of understanding the information provided will improve Outcome: Progressing   Problem: Activity: Goal: Interest or engagement in activities will improve Outcome: Progressing Goal: Sleeping patterns will improve Outcome: Progressing   Problem: Coping: Goal: Ability to verbalize frustrations and anger appropriately will improve Outcome: Progressing Goal: Ability to demonstrate self-control will improve Outcome: Progressing   Problem: Health  Behavior/Discharge Planning: Goal: Identification of resources available to assist in meeting health care needs will improve Outcome: Progressing Goal: Compliance with treatment plan for underlying cause of condition will improve Outcome: Progressing   Problem: Physical Regulation: Goal: Ability to maintain clinical measurements within normal limits will improve Outcome: Progressing   Problem: Safety: Goal: Periods of time without injury will increase Outcome: Progressing   Problem: Education: Goal: Utilization of techniques to improve thought processes will improve Outcome: Progressing Goal: Knowledge of the prescribed therapeutic regimen will improve Outcome: Progressing   Problem: Activity: Goal: Interest or engagement in leisure activities will improve Outcome: Progressing Goal: Imbalance in normal sleep/wake cycle will improve Outcome: Progressing   Problem: Coping: Goal: Coping ability will improve Outcome: Progressing Goal: Will verbalize feelings Outcome: Progressing   Problem: Health Behavior/Discharge Planning: Goal: Ability to make decisions will improve Outcome: Progressing Goal: Compliance with therapeutic regimen will improve Outcome: Progressing   Problem: Role Relationship: Goal: Will demonstrate positive changes in social behaviors and relationships Outcome: Progressing   Problem: Safety: Goal: Ability to disclose and discuss suicidal ideas will improve Outcome: Progressing Goal: Ability to identify and utilize support systems that promote safety will improve Outcome: Progressing   Problem: Self-Concept: Goal: Will verbalize positive feelings about self Outcome: Progressing Goal: Level of anxiety will decrease Outcome: Progressing   Problem: Activity: Goal: Will identify at least one activity in which they can participate Outcome: Progressing   Problem: Coping: Goal: Ability to identify and develop effective coping behavior will  improve Outcome: Progressing Goal: Ability to interact with others will improve Outcome: Progressing Goal: Demonstration of participation in decision-making regarding own care will improve Outcome: Progressing Goal: Ability to use eye contact when communicating with others will improve Outcome: Progressing   Problem: Health Behavior/Discharge Planning: Goal: Identification of resources available to assist in meeting health care needs will improve Outcome: Progressing   Problem: Self-Concept: Goal: Will verbalize positive feelings about self Outcome: Progressing

## 2023-10-18 NOTE — BHH Counselor (Signed)
Patient approached this CSW requesting information on the [facility].  CSW provided patient with the number as well as the contact information for other residential facilities.  Penni Homans, MSW, LCSW 10/18/2023 12:45 PM

## 2023-10-18 NOTE — Group Note (Signed)
Recreation Therapy Group Note   Group Topic:Leisure Education  Group Date: 10/18/2023 Start Time: 1015 End Time: 1115 Facilitators: Rosina Lowenstein, LRT, CTRS Location:  Craft Room  Group Description: Leisure. Patients were given the option to choose from singing karaoke, coloring mandalas, using oil pastels, journaling, or playing with play-doh. LRT and pts discussed the meaning of leisure, the importance of participating in leisure during their free time/when they're outside of the hospital, as well as how our leisure interests can also serve as coping skills.   Goal Area(s) Addressed:  Patient will identify a current leisure interest.  Patient will learn the definition of "leisure". Patient will practice making a positive decision. Patient will have the opportunity to try a new leisure activity. Patient will communicate with peers and LRT.    Affect/Mood: N/A   Participation Level: Did not attend    Clinical Observations/Individualized Feedback: Nandan did not attend group.   Plan: Continue to engage patient in RT group sessions 2-3x/week.   Rosina Lowenstein, LRT, CTRS 10/18/2023 12:02 PM

## 2023-10-18 NOTE — Group Note (Signed)
Date:  10/18/2023 Time:  4:53 PM  Group Topic/Focus:  Activity Group:  The focus of the group is to encourage patients to go outside to the courtyard to get some fresh air and exercise.    Participation Level:  Active  Participation Quality:  Appropriate  Affect:  Appropriate  Cognitive:  Appropriate  Insight: Appropriate  Engagement in Group:  Engaged  Modes of Intervention:  Activity  Additional Comments:    Jeremiah Newman [facility] 10/18/2023, 4:53 PM

## 2023-10-19 MED ORDER — MIRTAZAPINE 15 MG PO TABS
15.0000 mg | ORAL_TABLET | Freq: Every day | ORAL | Status: DC
  Administered 2023-10-19 – 2023-10-22 (×4): 15 mg via ORAL
  Filled 2023-10-19 (×4): qty 1

## 2023-10-19 MED ORDER — ESCITALOPRAM OXALATE 10 MG PO TABS
30.0000 mg | ORAL_TABLET | Freq: Every day | ORAL | Status: DC
  Administered 2023-10-20 – 2023-10-22 (×3): 30 mg via ORAL
  Filled 2023-10-19 (×3): qty 3

## 2023-10-19 MED ORDER — QUETIAPINE FUMARATE 200 MG PO TABS
300.0000 mg | ORAL_TABLET | Freq: Every day | ORAL | Status: DC
  Administered 2023-10-19 – 2023-10-22 (×4): 300 mg via ORAL
  Filled 2023-10-19 (×4): qty 1

## 2023-10-19 NOTE — BH IP Treatment Plan (Signed)
Interdisciplinary Treatment and Diagnostic Plan Update  10/19/2023 Time of Session: 1:04 pm Jeremiah Newman MRN: [id number]  Principal Diagnosis: Major depressive disorder, recurrent episode, severe, with psychosis (HCC)  Secondary Diagnoses: Principal Problem:   Major depressive disorder, recurrent episode, severe, with psychosis (HCC) Active Problems:   PTSD (post-traumatic stress disorder)   Insomnia   Current Medications:  Current Facility-Administered Medications  Medication Dose Route Frequency Provider Last Rate Last Admin   acetaminophen (TYLENOL) tablet 650 mg  650 mg Oral Q6H PRN [name], NP   650 mg at 10/16/23 1035   alum & mag hydroxide-simeth (MAALOX/MYLANTA) 200-200-20 MG/5ML suspension 30 mL  30 mL Oral Q4H PRN [name], NP       diphenhydrAMINE (BENADRYL) capsule 50 mg  50 mg Oral TID PRN [name], NP       Or   diphenhydrAMINE (BENADRYL) injection 50 mg  50 mg Intramuscular TID PRN [name], NP       escitalopram (LEXAPRO) tablet 20 mg  20 mg Oral Daily [name], NP   20 mg at 10/19/23 1005   gabapentin (NEURONTIN) capsule 300 mg  300 mg Oral TID Charm Rings, NP   300 mg at 10/19/23 1238   haloperidol (HALDOL) tablet 5 mg  5 mg Oral TID PRN [name], NP       Or   haloperidol lactate (HALDOL) injection 5 mg  5 mg Intramuscular TID PRN [name], NP       hydrOXYzine (ATARAX) tablet 25 mg  25 mg Oral Q6H PRN [name], NP   25 mg at 10/18/23 1435   LORazepam (ATIVAN) tablet 2 mg  2 mg Oral TID PRN [name], NP       magnesium hydroxide (MILK OF MAGNESIA) suspension 30 mL  30 mL Oral Daily PRN [name], NP   30 mL at 10/15/23 0841   mirtazapine (REMERON) tablet 7.5 mg  7.5 mg Oral QHS Charm Rings, NP   7.5 mg at 10/18/23 2236   QUEtiapine (SEROQUEL) tablet 200 mg  200 mg Oral QHS [name], NP   200 mg at 10/18/23 2235   traZODone (DESYREL) tablet 100 mg  100 mg Oral QHS PRN Charm Rings, NP   100 mg at 10/13/23 2114   PTA Medications: No medications prior to admission.    Patient Stressors: Substance abuse   Traumatic event    Patient Strengths: Average or above average intelligence  Capable of independent living   Treatment Modalities: Medication Management, Group therapy, Case management,  1 to 1 session with clinician, Psychoeducation, Recreational therapy.   Physician Treatment Plan for Primary Diagnosis: Major depressive disorder, recurrent episode, severe, with psychosis (HCC) Long Term Goal(s): Improvement in symptoms so as ready for discharge   Short Term Goals: Ability to identify changes in lifestyle to reduce recurrence of condition will improve Ability to verbalize feelings will improve Ability to disclose and discuss suicidal ideas Ability to demonstrate self-control will improve Ability to identify and develop effective coping behaviors will improve Ability to maintain clinical measurements within normal limits will improve Compliance with prescribed medications will improve Ability to identify triggers associated with substance abuse/mental health issues will improve  Medication Management: Evaluate patient's response, side effects, and tolerance of medication regimen.  Therapeutic Interventions: 1 to 1 sessions, Unit Group sessions and Medication administration.  Evaluation of Outcomes: Progressing  Physician Treatment Plan for Secondary  Diagnosis: Principal Problem:   Major depressive disorder, recurrent episode, severe, with psychosis (HCC) Active Problems:   PTSD (post-traumatic stress disorder)   Insomnia  Long Term Goal(s): Improvement in symptoms so as ready for discharge   Short Term Goals: Ability to identify changes in lifestyle to reduce recurrence of condition will improve Ability to verbalize feelings will improve Ability to disclose and discuss suicidal ideas Ability to demonstrate self-control will improve Ability  to identify and develop effective coping behaviors will improve Ability to maintain clinical measurements within normal limits will improve Compliance with prescribed medications will improve Ability to identify triggers associated with substance abuse/mental health issues will improve     Medication Management: Evaluate patient's response, side effects, and tolerance of medication regimen.  Therapeutic Interventions: 1 to 1 sessions, Unit Group sessions and Medication administration.  Evaluation of Outcomes: Progressing   RN Treatment Plan for Primary Diagnosis: Major depressive disorder, recurrent episode, severe, with psychosis (HCC) Long Term Goal(s): Knowledge of disease and therapeutic regimen to maintain health will improve  Short Term Goals: Ability to remain free from injury will improve, Ability to verbalize frustration and anger appropriately will improve, Ability to demonstrate self-control, Ability to participate in decision making will improve, Ability to verbalize feelings will improve, Ability to disclose and discuss suicidal ideas, Ability to identify and develop effective coping behaviors will improve, and Compliance with prescribed medications will improve  Medication Management: RN will administer medications as ordered by provider, will assess and evaluate patient's response and provide education to patient for prescribed medication. RN will report any adverse and/or side effects to prescribing provider.  Therapeutic Interventions: 1 on 1 counseling sessions, Psychoeducation, Medication administration, Evaluate responses to treatment, Monitor vital signs and CBGs as ordered, Perform/monitor CIWA, COWS, AIMS and Fall Risk screenings as ordered, Perform wound care treatments as ordered.  Evaluation of Outcomes: Progressing   LCSW Treatment Plan for Primary Diagnosis: Major depressive disorder, recurrent episode, severe, with psychosis (HCC) Long Term Goal(s): Safe  transition to appropriate next level of care at discharge, Engage patient in therapeutic group addressing interpersonal concerns.  Short Term Goals: Engage patient in aftercare planning with referrals and resources, Increase social support, Increase ability to appropriately verbalize feelings, Increase emotional regulation, Facilitate acceptance of mental health diagnosis and concerns, and Increase skills for wellness and recovery  Therapeutic Interventions: Assess for all discharge needs, 1 to 1 time with Social worker, Explore available resources and support systems, Assess for adequacy in community support network, Educate family and significant other(s) on suicide prevention, Complete Psychosocial Assessment, Interpersonal group therapy.  Evaluation of Outcomes: Progressing   Progress in Treatment: Attending groups: No. Participating in groups: No. Taking medication as prescribed: Yes. Toleration medication: Yes. Family/Significant other contact made: No, will contact:  once the patietn provides permission. Patient understands diagnosis: Yes. Discussing patient identified problems/goals with staff: Yes. Medical problems stabilized or resolved: Yes. Denies suicidal/homicidal ideation: No. Issues/concerns per patient self-inventory: No. Other: none  New problem(s) identified: Yes, Describe:  Patient is not comfortable at home and is interested in possible group home placement. Update 10/14/2023:  Patient is now seeking placement at a substance abuse residential facility.  Update 10/19/23: None at this time.   New Short Term/Long Term Goal(s):detox, elimination of symptoms of psychosis, medication management for mood stabilization; elimination of SI thoughts; development of comprehensive mental wellness/sobriety plan.  Update 10/14/2023:   No changes at this time.   Update 10/19/23: None at this time.  Patient Goals:  "Have peace in my life." Update 10/14/2023:   No changes at this  time.   Update 10/19/23: None at this time.   Discharge Plan or Barriers:  Update 10/14/2023:   Patient reports that he does not want to return to the home with his parents.  He states that his parents are "toxic".  He is requesting CSW assistance with placement at a residential facility.    Update 10/19/23: The referrals have been sent, waiting on decision to be made. The patient is still reporting that he is suicidal.            Reason for Continuation of Hospitalization: Anxiety Depression Medication stabilization Withdrawal symptoms   Estimated Length of Stay:1-7 days.  Update 10/14/2023:   TBD. Update 10/19/23: None at this time.  Last 3 [place] Suicide Severity Risk Score: Flowsheet Row Admission (Current) from 10/09/2023 in [facility] ED from 10/08/2023 in [facility] Department at [facility]  C-SSRS RISK CATEGORY Moderate Risk Moderate Risk       Last PHQ 2/9 Scores:     No data to display          Scribe for Treatment Team: Marshell Levan, LCSW 10/19/2023 1:04 PM

## 2023-10-19 NOTE — Progress Notes (Signed)
D- Patient alert and oriented. Affect/mood. Denies SI/ HI/ AVH. Patient denies pain. Patient endorses depression and anxiety. Goal of the day. A- Scheduled medications administered to patient, per MD orders. Support and encouragement provided.  Routine safety checks conducted every 15 minutes without incident.  Patient informed to notify staff with problems or concerns and verbalizes understanding. R- No adverse drug reactions noted.  Patient compliant with medications and treatment plan. Patient receptive, calm cooperative and interacts well with others on the unit.  Patient contracts for safety and  remains safe on the unit at this time.

## 2023-10-19 NOTE — Plan of Care (Signed)
  Problem: Education: Goal: Knowledge of [place] [place] [facility] Education information/materials will improve 10/19/2023 1842 by Delos Haring, RN Outcome: Progressing 10/19/2023 1841 by Delos Haring, RN Outcome: Progressing Goal: Emotional status will improve Outcome: Progressing Goal: Mental status will improve Outcome: Progressing Goal: Verbalization of understanding the information provided will improve Outcome: Progressing   Problem: Activity: Goal: Interest or engagement in activities will improve Outcome: Progressing Goal: Sleeping patterns will improve Outcome: Progressing   Problem: Coping: Goal: Ability to verbalize frustrations and anger appropriately will improve Outcome: Progressing Goal: Ability to demonstrate self-control will improve Outcome: Progressing   Problem: Health Behavior/Discharge Planning: Goal: Identification of resources available to assist in meeting health care needs will improve Outcome: Progressing Goal: Compliance with treatment plan for underlying cause of condition will improve Outcome: Progressing   Problem: Physical Regulation: Goal: Ability to maintain clinical measurements within normal limits will improve Outcome: Progressing   Problem: Safety: Goal: Periods of time without injury will increase Outcome: Progressing   Problem: Education: Goal: Knowledge of [place] Education information/materials will improve Outcome: Progressing Goal: Emotional status will improve Outcome: Progressing Goal: Mental status will improve Outcome: Progressing Goal: Verbalization of understanding the information provided will improve Outcome: Progressing   Problem: Activity: Goal: Interest or engagement in activities will improve Outcome: Progressing Goal: Sleeping patterns will improve Outcome: Progressing   Problem: Coping: Goal: Ability to verbalize frustrations and anger appropriately will improve Outcome: Progressing Goal:  Ability to demonstrate self-control will improve Outcome: Progressing   Problem: Health Behavior/Discharge Planning: Goal: Identification of resources available to assist in meeting health care needs will improve Outcome: Progressing Goal: Compliance with treatment plan for underlying cause of condition will improve Outcome: Progressing   Problem: Physical Regulation: Goal: Ability to maintain clinical measurements within normal limits will improve Outcome: Progressing   Problem: Safety: Goal: Periods of time without injury will increase Outcome: Progressing   Problem: Education: Goal: Utilization of techniques to improve thought processes will improve Outcome: Progressing Goal: Knowledge of the prescribed therapeutic regimen will improve Outcome: Progressing   Problem: Activity: Goal: Interest or engagement in leisure activities will improve Outcome: Progressing Goal: Imbalance in normal sleep/wake cycle will improve Outcome: Progressing   Problem: Coping: Goal: Coping ability will improve Outcome: Progressing Goal: Will verbalize feelings Outcome: Progressing   Problem: Health Behavior/Discharge Planning: Goal: Ability to make decisions will improve Outcome: Progressing Goal: Compliance with therapeutic regimen will improve Outcome: Progressing   Problem: Role Relationship: Goal: Will demonstrate positive changes in social behaviors and relationships Outcome: Progressing   Problem: Safety: Goal: Ability to disclose and discuss suicidal ideas will improve Outcome: Progressing Goal: Ability to identify and utilize support systems that promote safety will improve Outcome: Progressing   Problem: Self-Concept: Goal: Will verbalize positive feelings about self Outcome: Progressing Goal: Level of anxiety will decrease Outcome: Progressing   Problem: Activity: Goal: Will identify at least one activity in which they can participate Outcome: Progressing   Problem:  Coping: Goal: Ability to identify and develop effective coping behavior will improve Outcome: Progressing Goal: Ability to interact with others will improve Outcome: Progressing Goal: Demonstration of participation in decision-making regarding own care will improve Outcome: Progressing Goal: Ability to use eye contact when communicating with others will improve Outcome: Progressing   Problem: Health Behavior/Discharge Planning: Goal: Identification of resources available to assist in meeting health care needs will improve Outcome: Progressing   Problem: Self-Concept: Goal: Will verbalize positive feelings about self Outcome: Progressing

## 2023-10-19 NOTE — Progress Notes (Signed)
[facility] MD Progress Note  10/19/2023 6:59 PM Jeremiah Newman  MRN:  [id number] Subjective:  *** Principal Problem: Major depressive disorder, recurrent episode, severe, with psychosis (HCC) Diagnosis: Principal Problem:   Major depressive disorder, recurrent episode, severe, with psychosis (HCC) Active Problems:   PTSD (post-traumatic stress disorder)   Insomnia  Total Time spent with patient: {Time; 15 min - 8 hours:17441}  Past Psychiatric History: ***  Past Medical History: History reviewed. No pertinent past medical history. History reviewed. No pertinent surgical history. Family History: History reviewed. No pertinent family history. Family Psychiatric  History: *** Social History:  Social History   Substance and Sexual Activity  Alcohol Use Yes     Social History   Substance and Sexual Activity  Drug Use No    Social History   Socioeconomic History   Marital status: Single    Spouse name: Not on file   Number of children: Not on file   Years of education: Not on file   Highest education level: Not on file  Occupational History   Not on file  Tobacco Use   Smoking status: Some Days    Current packs/day: 1.00    Average packs/day: 1 pack/day for 8.9 years (8.9 ttl pk-yrs)    Types: Cigarettes    Start date: 2016   Smokeless tobacco: Never  Substance and Sexual Activity   Alcohol use: Yes   Drug use: No   Sexual activity: Not Currently  Other Topics Concern   Not on file  Social History Narrative   Not on file   Social Determinants of Health   Financial Resource Strain: Not on file  Food Insecurity: No Food Insecurity (10/09/2023)   Hunger Vital Sign    Worried About Running Out of Food in the Last Year: Never true    Ran Out of Food in the Last Year: Never true  Transportation Needs: No Transportation Needs (10/09/2023)   PRAPARE - Administrator, Civil Service (Medical): No    Lack of Transportation (Non-Medical): No  Physical Activity:  Not on file  Stress: Not on file  Social Connections: Not on file   Additional Social History:                         Sleep: {BHH GOOD/FAIR/POOR:22877}  Appetite:  {BHH GOOD/FAIR/POOR:22877}  Current Medications: Current Facility-Administered Medications  Medication Dose Route Frequency Provider Last Rate Last Admin   acetaminophen (TYLENOL) tablet 650 mg  650 mg Oral Q6H PRN [name], NP   650 mg at 10/16/23 1035   alum & mag hydroxide-simeth (MAALOX/MYLANTA) 200-200-20 MG/5ML suspension 30 mL  30 mL Oral Q4H PRN [name], NP       diphenhydrAMINE (BENADRYL) capsule 50 mg  50 mg Oral TID PRN [name], NP       Or   diphenhydrAMINE (BENADRYL) injection 50 mg  50 mg Intramuscular TID PRN [name], NP       [START ON 10/20/2023] escitalopram (LEXAPRO) tablet 30 mg  30 mg Oral Daily [name], NP       gabapentin (NEURONTIN) capsule 300 mg  300 mg Oral TID Charm Rings, NP   300 mg at 10/19/23 1710   haloperidol (HALDOL) tablet 5 mg  5 mg Oral TID PRN [name], NP       Or   haloperidol lactate (HALDOL) injection 5 mg  5 mg Intramuscular TID  PRN [name], NP       hydrOXYzine (ATARAX) tablet 25 mg  25 mg Oral Q6H PRN [name], NP   25 mg at 10/18/23 1435   LORazepam (ATIVAN) tablet 2 mg  2 mg Oral TID PRN [name], NP       magnesium hydroxide (MILK OF MAGNESIA) suspension 30 mL  30 mL Oral Daily PRN [name], NP   30 mL at 10/15/23 0841   mirtazapine (REMERON) tablet 15 mg  15 mg Oral QHS [name], NP       QUEtiapine (SEROQUEL) tablet 300 mg  300 mg Oral QHS [name], NP       traZODone (DESYREL) tablet 100 mg  100 mg Oral QHS PRN Charm Rings, NP   100 mg at 10/13/23 2114    Lab Results: No results found for this or any previous visit (from the past 48 hour(s)).  Blood Alcohol level:  Lab Results  Component Value Date   ETH <10 10/08/2023   ETH 215 (H) 04/22/2017    Metabolic  Disorder Labs: Lab Results  Component Value Date   HGBA1C 4.9 10/10/2023   MPG 93.93 10/10/2023   No results found for: "PROLACTIN" Lab Results  Component Value Date   CHOL 214 (H) 10/10/2023   TRIG 135 10/10/2023   HDL 41 10/10/2023   CHOLHDL 5.2 10/10/2023   VLDL 27 10/10/2023   LDLCALC 146 (H) 10/10/2023    Physical Findings: AIMS:  , ,  ,  ,    CIWA:    COWS:     Musculoskeletal: Strength & Muscle Tone: {desc; muscle tone:32375} Gait & Station: {PE GAIT ED NATL:22525} Patient leans: {Patient Leans:21022755}  Psychiatric Specialty Exam:  Presentation  General Appearance:  Appropriate for Environment  Eye Contact: Good  Speech: Clear and Coherent; Normal Rate  Speech Volume: Normal  Handedness: Right   Mood and Affect  Mood: Anxious; Irritable; Hopeless  Affect: Flat   Thought Process  Thought Processes: Coherent  Descriptions of Associations:Intact  Orientation:Full (Time, Place and Person)  Thought Content:Logical; WDL  History of Schizophrenia/Schizoaffective disorder:No  Duration of Psychotic Symptoms:Less than six months  Hallucinations:Hallucinations: Auditory Description of Auditory Hallucinations: "to hurt myself"  Ideas of Reference:None  Suicidal Thoughts:Suicidal Thoughts: Yes, Passive SI Passive Intent and/or Plan: Without Intent; Without Plan; Without Access to Means; Without Means to Carry Out  Homicidal Thoughts:Homicidal Thoughts: No   Sensorium  Memory: Immediate Good; Remote Good  Judgment: Poor  Insight: Poor   Executive Functions  Concentration: Fair  Attention Span: Fair  Recall: Good  Fund of Knowledge: Good  Language: Good   Psychomotor Activity  Psychomotor Activity: Psychomotor Activity: Normal   Assets  Assets: Communication Skills; Desire for Improvement; Social Support   Sleep  Sleep: Sleep: Fair Number of Hours of Sleep: 5    Physical Exam: Physical  Exam ROS Blood pressure (!) 110/42, pulse 71, temperature 98.4 F (36.9 C), resp. rate 16, height 5\' 8"  (1.727 m), weight 79.4 kg, SpO2 99%. Body mass index is 26.61 kg/m.   Treatment Plan Summary: {CHL [facility] MD [place]. ZOXW:960454098}  [name], NP 10/19/2023, 6:59 PM

## 2023-10-19 NOTE — Progress Notes (Signed)
   10/19/23 0900  Psych Admission Type (Psych Patients Only)  Admission Status Voluntary  Psychosocial Assessment  Patient Complaints Anxiety  Eye Contact Fair  Facial Expression Anxious  Affect Appropriate to circumstance  Speech Logical/coherent  Interaction Assertive  Motor Activity Slow  Appearance/Hygiene Unremarkable  Behavior Characteristics Cooperative  Mood Pleasant  Thought Process  Coherency WDL  Content WDL  Delusions None reported or observed  Perception WDL  Hallucination None reported or observed  Judgment WDL  Confusion WDL  Danger to Self  Current suicidal ideation? Denies  Agreement Not to Harm Self Yes  Description of Agreement verbal  Danger to Others Abnormal  Harmful Behavior to others No threats or harm toward other people  Destructive Behavior No threats or harm toward property

## 2023-10-19 NOTE — Group Note (Signed)
Date:  10/19/2023 Time:  8:54 PM  Group Topic/Focus:  Wrap-Up Group:   The focus of this group is to help patients review their daily goal of treatment and discuss progress on daily workbooks.    Participation Level:  Active  Participation Quality:  Appropriate  Affect:  Appropriate  Cognitive:  Appropriate  Insight: Appropriate  Engagement in Group:  Engaged  Modes of Intervention:  Discussion    Lenore Cordia 10/19/2023, 8:54 PM

## 2023-10-19 NOTE — Progress Notes (Signed)
Nursing Shift Note:  1900-0700  Attended Evening Group: yes, active in group Medication Compliant:  Yes Behavior:      Pleasant Sleep Quality: Fair Significant Changes:    10/19/23 0400  Psych Admission Type (Psych Patients Only)  Admission Status Voluntary  Psychosocial Assessment  Patient Complaints None  Eye Contact Brief  Facial Expression Flat  Affect Appropriate to circumstance  Speech Logical/coherent  Interaction Assertive  Motor Activity Slow  Appearance/Hygiene Unremarkable  Behavior Characteristics Cooperative  Mood Pleasant  Thought Process  Coherency WDL  Content WDL  Delusions None reported or observed  Perception WDL  Hallucination None reported or observed  Judgment WDL  Confusion WDL  Danger to Self  Current suicidal ideation? Denies  Agreement Not to Harm Self Yes  Danger to Others  Danger to Others None reported or observed  Danger to Others Abnormal  Harmful Behavior to others No threats or harm toward other people  Destructive Behavior No threats or harm toward property

## 2023-10-20 DIAGNOSIS — F333 Major depressive disorder, recurrent, severe with psychotic symptoms: Secondary | ICD-10-CM | POA: Diagnosis not present

## 2023-10-20 MED ORDER — ESCITALOPRAM OXALATE 10 MG PO TABS
30.0000 mg | ORAL_TABLET | Freq: Every day | ORAL | 0 refills | Status: DC
  Filled 2023-10-20: qty 90, 30d supply, fill #0

## 2023-10-20 MED ORDER — MIRTAZAPINE 15 MG PO TABS
15.0000 mg | ORAL_TABLET | Freq: Every day | ORAL | 0 refills | Status: DC
  Filled 2023-10-20: qty 30, 30d supply, fill #0

## 2023-10-20 MED ORDER — QUETIAPINE FUMARATE 300 MG PO TABS
300.0000 mg | ORAL_TABLET | Freq: Every day | ORAL | 0 refills | Status: DC
  Filled 2023-10-20: qty 30, 30d supply, fill #0

## 2023-10-20 NOTE — Group Note (Signed)
LCSW Group Therapy Note   Group Date: 10/20/2023 Start Time: 1310 End Time: 1350   Type of Therapy and Topic:  Group Therapy: Feelings Awareness  Participation Level:  Active    Summary of Patient Progress:  The patient attended group. The patient demonstrated an understanding of the difference between a feeling and a behavior. The patient participated during both the discussion and game of Feelings Charades.     Therapeutic Modalities:   Cognitive behavioral therapy (CBT)  Marshell Levan, LCSWA 10/20/2023  2:08 PM

## 2023-10-20 NOTE — Plan of Care (Signed)
  Problem: Activity: Goal: Interest or engagement in activities will improve Outcome: Progressing Goal: Sleeping patterns will improve Outcome: Progressing   Problem: Coping: Goal: Ability to verbalize frustrations and anger appropriately will improve Outcome: Progressing Goal: Ability to demonstrate self-control will improve Outcome: Progressing   Problem: Education: Goal: Knowledge of [place] [facility] Education information/materials will improve Outcome: Progressing Goal: Emotional status will improve Outcome: Progressing Goal: Verbalization of understanding the information provided will improve Outcome: Progressing

## 2023-10-20 NOTE — Plan of Care (Signed)
  Problem: Education: Goal: Knowledge of [facility] Education information/materials will improve Outcome: Progressing Goal: Emotional status will improve Outcome: Progressing Goal: Mental status will improve Outcome: Progressing Goal: Verbalization of understanding the information provided will improve Outcome: Progressing   Problem: Activity: Goal: Interest or engagement in activities will improve Outcome: Progressing Goal: Sleeping patterns will improve Outcome: Progressing   Problem: Coping: Goal: Ability to verbalize frustrations and anger appropriately will improve Outcome: Progressing Goal: Ability to demonstrate self-control will improve Outcome: Progressing   Problem: Health Behavior/Discharge Planning: Goal: Identification of resources available to assist in meeting health care needs will improve Outcome: Progressing Goal: Compliance with treatment plan for underlying cause of condition will improve Outcome: Progressing   Problem: Physical Regulation: Goal: Ability to maintain clinical measurements within normal limits will improve Outcome: Progressing   Problem: Safety: Goal: Periods of time without injury will increase Outcome: Progressing   Problem: Education: Goal: Knowledge of [facility] [place] [facility] Education information/materials will improve Outcome: Progressing Goal: Emotional status will improve Outcome: Progressing Goal: Mental status will improve Outcome: Progressing Goal: Verbalization of understanding the information provided will improve Outcome: Progressing   Problem: Activity: Goal: Interest or engagement in activities will improve Outcome: Progressing Goal: Sleeping patterns will improve Outcome: Progressing   Problem: Coping: Goal: Ability to verbalize frustrations and anger appropriately will improve Outcome: Progressing Goal: Ability to demonstrate self-control will improve Outcome: Progressing   Problem: Health  Behavior/Discharge Planning: Goal: Identification of resources available to assist in meeting health care needs will improve Outcome: Progressing Goal: Compliance with treatment plan for underlying cause of condition will improve Outcome: Progressing   Problem: Physical Regulation: Goal: Ability to maintain clinical measurements within normal limits will improve Outcome: Progressing   Problem: Safety: Goal: Periods of time without injury will increase Outcome: Progressing   Problem: Education: Goal: Utilization of techniques to improve thought processes will improve Outcome: Progressing Goal: Knowledge of the prescribed therapeutic regimen will improve Outcome: Progressing   Problem: Activity: Goal: Interest or engagement in leisure activities will improve Outcome: Progressing Goal: Imbalance in normal sleep/wake cycle will improve Outcome: Progressing   Problem: Coping: Goal: Coping ability will improve Outcome: Progressing Goal: Will verbalize feelings Outcome: Progressing   Problem: Health Behavior/Discharge Planning: Goal: Ability to make decisions will improve Outcome: Progressing Goal: Compliance with therapeutic regimen will improve Outcome: Progressing   Problem: Role Relationship: Goal: Will demonstrate positive changes in social behaviors and relationships Outcome: Progressing   Problem: Safety: Goal: Ability to disclose and discuss suicidal ideas will improve Outcome: Progressing Goal: Ability to identify and utilize support systems that promote safety will improve Outcome: Progressing   Problem: Self-Concept: Goal: Will verbalize positive feelings about self Outcome: Progressing Goal: Level of anxiety will decrease Outcome: Progressing   Problem: Activity: Goal: Will identify at least one activity in which they can participate Outcome: Progressing   Problem: Coping: Goal: Ability to identify and develop effective coping behavior will  improve Outcome: Progressing Goal: Ability to interact with others will improve Outcome: Progressing Goal: Demonstration of participation in decision-making regarding own care will improve Outcome: Progressing Goal: Ability to use eye contact when communicating with others will improve Outcome: Progressing   Problem: Health Behavior/Discharge Planning: Goal: Identification of resources available to assist in meeting health care needs will improve Outcome: Progressing   Problem: Self-Concept: Goal: Will verbalize positive feelings about self Outcome: Progressing

## 2023-10-20 NOTE — Progress Notes (Signed)
D- Patient alert and oriented x 4. Affect bright and anxious/mood congruent. Denies SI/ HI/ AVH. Patient denies pain. Patient endorses depression and anxiety. His goal is "to be at peace". A- Scheduled medications administered to patient, per MD orders. Support and encouragement provided.  Routine safety checks conducted every 15 minutes without incident.  Patient informed to notify staff with problems or concerns and verbalizes understanding. R- No adverse drug reactions noted. Patient compliant with medications and treatment plan. Patient receptive, calm and cooperative. He interacts well with others on the unit and attends unit. Patient contracts for safety and  remains safe on the unit at this time.

## 2023-10-20 NOTE — Group Note (Signed)
Date:  10/20/2023 Time:  8:44 PM  Group Topic/Focus:  Wrap-Up Group:   The focus of this group is to help patients review their daily goal of treatment and discuss progress on daily workbooks.    Participation Level:  Active  Participation Quality:  Appropriate  Affect:  Appropriate  Cognitive:  Appropriate  Insight: Appropriate  Engagement in Group:  Engaged  Modes of Intervention:  Discussion, Education, and Support  Additional Comments:    Wilford Corner 10/20/2023, 8:44 PM

## 2023-10-20 NOTE — Plan of Care (Signed)
  Problem: Education: Goal: Knowledge of [place] [facility] Education information/materials will improve Outcome: Adequate for Discharge Goal: Emotional status will improve Outcome: Adequate for Discharge Goal: Mental status will improve Outcome: Adequate for Discharge Goal: Verbalization of understanding the information provided will improve Outcome: Adequate for Discharge   Problem: Activity: Goal: Interest or engagement in activities will improve Outcome: Adequate for Discharge Goal: Sleeping patterns will improve Outcome: Adequate for Discharge   Problem: Coping: Goal: Ability to verbalize frustrations and anger appropriately will improve Outcome: Adequate for Discharge Goal: Ability to demonstrate self-control will improve Outcome: Adequate for Discharge   Problem: Education: Goal: Knowledge of [place] [facility] Education information/materials will improve Outcome: Adequate for Discharge Goal: Emotional status will improve Outcome: Adequate for Discharge Goal: Mental status will improve Outcome: Adequate for Discharge Goal: Verbalization of understanding the information provided will improve Outcome: Adequate for Discharge

## 2023-10-20 NOTE — Group Note (Signed)
Date:  10/20/2023 Time:  12:50 PM  Group Topic/Focus:  Goals Group:   The focus of this group is to help patients establish daily goals to achieve during treatment and discuss how the patient can incorporate goal setting into their daily lives to aide in recovery. Self Care:   The focus of this group is to help patients understand the importance of self-care in order to improve or restore emotional, physical, spiritual, interpersonal, and financial health.    Participation Level:  Did Not Attend   Jeremiah Newman 10/20/2023, 12:50 PM

## 2023-10-20 NOTE — Progress Notes (Signed)
Patient alert and oriented x4, affect/mood. Seems bright and superficial, he denied SI, HI, plan or intent, did admit to  AVH, and without pain. Patient attended attended scheduled groups and  interactions with others noted an seemed appropriate. Pt focussed on possibly leaving on Tuesday for a long term rehab program - Scheduled medications administered to patient, per MD orders. Support and encouragement provided.  Routine safety checks conducted every 15 minutes.  Patient informed to notify staff with problems or concerns. R- No adverse drug reactions noted. Patient contracts for safety at this time. Patient compliant with medications and treatment plan. Patient receptive, calm, and cooperative. Patient interacts well with others on the unit.  Patient remains safe at this time.

## 2023-10-20 NOTE — Group Note (Signed)
Date:  10/20/2023 Time:  4:20 PM  Group Topic/Focus:  OUTDOOR RECREATION STRUCTURED ACTIVITY    Participation Level:  Active  Participation Quality:  Appropriate  Affect:  Appropriate  Cognitive:  Appropriate  Insight: Appropriate  Engagement in Group:  Developing/Improving and Engaged  Modes of Intervention:  Activity  Additional Comments:    Syan Cullimore 10/20/2023, 4:20 PM

## 2023-10-20 NOTE — Progress Notes (Signed)
D- Patient alert and oriented. Denies SI, HI, AVH, and pain. A- Scheduled medications administered to patient, per MD orders. Support and encouragement provided.  Routine safety checks conducted every 15 minutes.  Patient informed to notify staff with problems or concerns. R- No adverse drug reactions noted. Patient contracts for safety at this time. Patient compliant with medications and treatment plan. Patient receptive, calm, and cooperative. Patient interacts well with others on the unit.  Patient remains safe at this time.   10/20/23 2200  Psych Admission Type (Psych Patients Only)  Admission Status Voluntary  Psychosocial Assessment  Patient Complaints None  Eye Contact Fair  Facial Expression Animated  Affect Preoccupied  Speech Slow  Interaction Arrogant  Motor Activity Slow  Appearance/Hygiene Unremarkable  Behavior Characteristics Cooperative  Mood Pleasant  Thought Process  Coherency WDL  Content WDL  Delusions None reported or observed  Perception WDL  Hallucination None reported or observed  Judgment WDL  Confusion WDL  Danger to Self  Current suicidal ideation? Denies  Agreement Not to Harm Self Yes  Description of Agreement Verbal

## 2023-10-20 NOTE — Plan of Care (Signed)
Problem: Education: Goal: Knowledge of [facility] Education information/materials will improve 10/20/2023 1903 by Delos Haring, RN Outcome: Progressing 10/20/2023 1016 by Delos Haring, RN Outcome: Progressing Goal: Emotional status will improve 10/20/2023 1903 by Delos Haring, RN Outcome: Progressing 10/20/2023 1016 by Delos Haring, RN Outcome: Progressing Goal: Mental status will improve 10/20/2023 1903 by Delos Haring, RN Outcome: Progressing 10/20/2023 1016 by Delos Haring, RN Outcome: Progressing Goal: Verbalization of understanding the information provided will improve 10/20/2023 1903 by Delos Haring, RN Outcome: Progressing 10/20/2023 1016 by Delos Haring, RN Outcome: Progressing   Problem: Activity: Goal: Interest or engagement in activities will improve 10/20/2023 1903 by Delos Haring, RN Outcome: Progressing 10/20/2023 1016 by Delos Haring, RN Outcome: Progressing Goal: Sleeping patterns will improve Outcome: Progressing   Problem: Coping: Goal: Ability to verbalize frustrations and anger appropriately will improve Outcome: Progressing Goal: Ability to demonstrate self-control will improve Outcome: Progressing   Problem: Health Behavior/Discharge Planning: Goal: Identification of resources available to assist in meeting health care needs will improve Outcome: Progressing Goal: Compliance with treatment plan for underlying cause of condition will improve Outcome: Progressing   Problem: Physical Regulation: Goal: Ability to maintain clinical measurements within normal limits will improve Outcome: Progressing   Problem: Safety: Goal: Periods of time without injury will increase Outcome: Progressing   Problem: Education: Goal: Knowledge of [facility] Education information/materials will improve Outcome: Progressing Goal: Emotional status will improve Outcome: Progressing Goal: Mental status will  improve Outcome: Progressing Goal: Verbalization of understanding the information provided will improve Outcome: Progressing   Problem: Activity: Goal: Interest or engagement in activities will improve Outcome: Progressing Goal: Sleeping patterns will improve Outcome: Progressing   Problem: Coping: Goal: Ability to verbalize frustrations and anger appropriately will improve Outcome: Progressing Goal: Ability to demonstrate self-control will improve Outcome: Progressing   Problem: Health Behavior/Discharge Planning: Goal: Identification of resources available to assist in meeting health care needs will improve Outcome: Progressing Goal: Compliance with treatment plan for underlying cause of condition will improve Outcome: Progressing   Problem: Physical Regulation: Goal: Ability to maintain clinical measurements within normal limits will improve Outcome: Progressing   Problem: Safety: Goal: Periods of time without injury will increase Outcome: Progressing   Problem: Education: Goal: Utilization of techniques to improve thought processes will improve Outcome: Progressing Goal: Knowledge of the prescribed therapeutic regimen will improve Outcome: Progressing   Problem: Activity: Goal: Interest or engagement in leisure activities will improve Outcome: Progressing Goal: Imbalance in normal sleep/wake cycle will improve Outcome: Progressing   Problem: Coping: Goal: Coping ability will improve Outcome: Progressing Goal: Will verbalize feelings Outcome: Progressing   Problem: Health Behavior/Discharge Planning: Goal: Ability to make decisions will improve Outcome: Progressing Goal: Compliance with therapeutic regimen will improve Outcome: Progressing   Problem: Role Relationship: Goal: Will demonstrate positive changes in social behaviors and relationships Outcome: Progressing   Problem: Safety: Goal: Ability to disclose and discuss suicidal ideas will  improve Outcome: Progressing Goal: Ability to identify and utilize support systems that promote safety will improve Outcome: Progressing   Problem: Self-Concept: Goal: Will verbalize positive feelings about self Outcome: Progressing Goal: Level of anxiety will decrease Outcome: Progressing   Problem: Activity: Goal: Will identify at least one activity in which they can participate Outcome: Progressing   Problem: Coping: Goal: Ability to identify and develop effective coping behavior will improve Outcome: Progressing Goal: Ability to interact with others will improve Outcome: Progressing Goal: Demonstration of  participation in decision-making regarding own care will improve Outcome: Progressing Goal: Ability to use eye contact when communicating with others will improve Outcome: Progressing   Problem: Health Behavior/Discharge Planning: Goal: Identification of resources available to assist in meeting health care needs will improve Outcome: Progressing   Problem: Self-Concept: Goal: Will verbalize positive feelings about self Outcome: Progressing

## 2023-10-20 NOTE — Progress Notes (Signed)
[facility] MD Progress Note  10/20/2023 4:12 PM Jeremiah Newman  MRN:  [id number] Subjective:   32 year old Caucasian male who reports significant improvement in mood and emotional state. Denies suicidal ideation (SI), homicidal ideation (HI), and self-injurious behavior (SIB). Denies delusions, hallucinations, or other psychotic symptoms. Expresses readiness for discharge and confidence in transitioning back home Principal Problem: Major depressive disorder, recurrent episode, severe, with psychosis (HCC) Diagnosis: Principal Problem:   Major depressive disorder, recurrent episode, severe, with psychosis (HCC) Active Problems:   PTSD (post-traumatic stress disorder)   Insomnia  Total Time spent with patient: 1 hour  Past Psychiatric History: Anxiety Substance Abuse  Past Medical History: History reviewed. No pertinent past medical history. History reviewed. No pertinent surgical history. Family History: History reviewed. No pertinent family history. Family Psychiatric  History: Mother Bipolar Social History:  Social History   Substance and Sexual Activity  Alcohol Use Yes     Social History   Substance and Sexual Activity  Drug Use No    Social History   Socioeconomic History   Marital status: Single    Spouse name: Not on file   Number of children: Not on file   Years of education: Not on file   Highest education level: Not on file  Occupational History   Not on file  Tobacco Use   Smoking status: Some Days    Current packs/day: 1.00    Average packs/day: 1 pack/day for 8.9 years (8.9 ttl pk-yrs)    Types: Cigarettes    Start date: 2016   Smokeless tobacco: Never  Substance and Sexual Activity   Alcohol use: Yes   Drug use: No   Sexual activity: Not Currently  Other Topics Concern   Not on file  Social History Narrative   Not on file   Social Determinants of Health   Financial Resource Strain: Not on file  Food Insecurity: No Food Insecurity (10/09/2023)    Hunger Vital Sign    Worried About Running Out of Food in the Last Year: Never true    Ran Out of Food in the Last Year: Never true  Transportation Needs: No Transportation Needs (10/09/2023)   PRAPARE - Administrator, Civil Service (Medical): No    Lack of Transportation (Non-Medical): No  Physical Activity: Not on file  Stress: Not on file  Social Connections: Not on file   Additional Social History:                         Sleep: Good  Appetite:  Good  Current Medications: Current Facility-Administered Medications  Medication Dose Route Frequency Provider Last Rate Last Admin   acetaminophen (TYLENOL) tablet 650 mg  650 mg Oral Q6H PRN [name], NP   650 mg at 10/16/23 1035   alum & mag hydroxide-simeth (MAALOX/MYLANTA) 200-200-20 MG/5ML suspension 30 mL  30 mL Oral Q4H PRN [name], NP       diphenhydrAMINE (BENADRYL) capsule 50 mg  50 mg Oral TID PRN [name], NP       Or   diphenhydrAMINE (BENADRYL) injection 50 mg  50 mg Intramuscular TID PRN [name], NP       escitalopram (LEXAPRO) tablet 30 mg  30 mg Oral Daily [name], NP   30 mg at 10/20/23 3664   gabapentin (NEURONTIN) capsule 300 mg  300 mg Oral TID Charm Rings, NP   300 mg at 10/20/23 1733  haloperidol (HALDOL) tablet 5 mg  5 mg Oral TID PRN [name], NP       Or   haloperidol lactate (HALDOL) injection 5 mg  5 mg Intramuscular TID PRN [name], NP       hydrOXYzine (ATARAX) tablet 25 mg  25 mg Oral Q6H PRN [name], NP   25 mg at 10/18/23 1435   LORazepam (ATIVAN) tablet 2 mg  2 mg Oral TID PRN [name], NP       magnesium hydroxide (MILK OF MAGNESIA) suspension 30 mL  30 mL Oral Daily PRN [name], NP   30 mL at 10/15/23 0841   mirtazapine (REMERON) tablet 15 mg  15 mg Oral QHS [name], NP   15 mg at 10/19/23 2115   QUEtiapine (SEROQUEL) tablet 300 mg  300 mg Oral QHS [name], NP   300 mg at 10/19/23  2115   traZODone (DESYREL) tablet 100 mg  100 mg Oral QHS PRN Charm Rings, NP   100 mg at 10/13/23 2114    Lab Results: No results found for this or any previous visit (from the past 48 hour(s)).  Blood Alcohol level:  Lab Results  Component Value Date   ETH <10 10/08/2023   ETH 215 (H) 04/22/2017    Metabolic Disorder Labs: Lab Results  Component Value Date   HGBA1C 4.9 10/10/2023   MPG 93.93 10/10/2023   No results found for: "PROLACTIN" Lab Results  Component Value Date   CHOL 214 (H) 10/10/2023   TRIG 135 10/10/2023   HDL 41 10/10/2023   CHOLHDL 5.2 10/10/2023   VLDL 27 10/10/2023   LDLCALC 146 (H) 10/10/2023    Physical Findings: AIMS:  , ,  ,  ,    CIWA:    COWS:     Musculoskeletal: Strength & Muscle Tone: within normal limits Gait & Station: normal Patient leans: N/A  Psychiatric Specialty Exam:  Presentation  General Appearance:  Appropriate for Environment; Neat  Eye Contact: Good  Speech: Clear and Coherent; Normal Rate  Speech Volume: Normal  Handedness: Right   Mood and Affect  Mood: Euthymic  Affect: Appropriate; Congruent   Thought Process  Thought Processes: Coherent  Descriptions of Associations:Intact  Orientation:-- (and situation)  Thought Content:WDL  History of Schizophrenia/Schizoaffective disorder:No  Duration of Psychotic Symptoms:Less than six months  Hallucinations:Hallucinations: None Description of Auditory Hallucinations: denies  Ideas of Reference:None  Suicidal Thoughts:Suicidal Thoughts: No SI Passive Intent and/or Plan: -- (denies)  Homicidal Thoughts:Homicidal Thoughts: No   Sensorium  Memory: Immediate Good; Immediate Fair  Judgment: Good  Insight: Fair   Executive Functions  Concentration: Good  Attention Span: Fair  Recall: Good  Fund of Knowledge: Good  Language: Good   Psychomotor Activity  Psychomotor Activity:Psychomotor Activity: Normal   Assets   Assets: Communication Skills; Resilience; Desire for Improvement   Sleep  Sleep:Sleep: Good Number of Hours of Sleep: 8    Physical Exam: Physical Exam Vitals and nursing note reviewed.  Constitutional:      Appearance: Normal appearance.  HENT:     Head: Normocephalic and atraumatic.     Nose: Nose normal.  Cardiovascular:     Rate and Rhythm: Normal rate.  Pulmonary:     Effort: Pulmonary effort is normal.  Musculoskeletal:        General: Normal range of motion.     Cervical back: Normal range of motion.  Neurological:  General: No focal deficit present.     Mental Status: He is alert and oriented to person, place, and time.  Psychiatric:        Attention and Perception: Attention and perception normal.        Mood and Affect: Mood and affect normal.        Speech: Speech normal.        Behavior: Behavior normal. Behavior is cooperative.        Thought Content: Thought content normal.        Cognition and Memory: Cognition and memory normal.        Judgment: Judgment normal.    ROS Blood pressure (!) 111/58, pulse 87, temperature 97.8 F (36.6 C), resp. rate 18, height 5\' 8"  (1.727 m), weight 79.4 kg, SpO2 99%. Body mass index is 26.61 kg/m.   Treatment Plan Summary: Daily contact with patient to assess and evaluate symptoms and progress in treatment and Medication management Lexapro Increase 20 mg daily to better manage anxiety and symptoms of PTSD  Seroquel (quetiapine): Increase to 300 mg nightly for mood stabilization and to assist with sleep. Remeron (mirtazapine): Continue at 15 mg nightly for sleep and mood support Patient is cleared for discharge tomorrow with a 30-day supply of all prescribed medications, ready for pick-up. Provide a written safety plan, including emergency contacts and crisis hotline information. Schedule outpatient psychiatric follow-up within 7 days post-discharge to monitor mood, medication efficacy, and overall mental  health. Provide a written safety plan, including emergency contacts and crisis hotline information.  [name], NP 10/20/2023, 7:18 PM

## 2023-10-21 ENCOUNTER — Other Ambulatory Visit: Payer: Self-pay

## 2023-10-21 DIAGNOSIS — F333 Major depressive disorder, recurrent, severe with psychotic symptoms: Secondary | ICD-10-CM | POA: Diagnosis not present

## 2023-10-21 MED ORDER — HYDROXYZINE HCL 50 MG PO TABS
50.0000 mg | ORAL_TABLET | Freq: Four times a day (QID) | ORAL | Status: DC | PRN
Start: 2023-10-21 — End: 2023-10-22
  Administered 2023-10-21: 50 mg via ORAL
  Filled 2023-10-21: qty 1

## 2023-10-21 MED ORDER — PANTOPRAZOLE SODIUM 40 MG PO TBEC
40.0000 mg | DELAYED_RELEASE_TABLET | Freq: Every day | ORAL | Status: DC
  Administered 2023-10-21 – 2023-10-23 (×3): 40 mg via ORAL
  Filled 2023-10-21 (×3): qty 1

## 2023-10-21 NOTE — Progress Notes (Signed)
[facility] MD Progress Note  10/21/2023 3:01 PM Jeremiah Newman  MRN:  [id number]  Subjective:  Pt chart reviewed, discussed with interdisciplinary team, and seen on rounds. Pt endorses anxious mood. Reports he feels he is "anxious all the time, prison did that to me". Endorses challenges with re-integration. He is hopeful he can be discharged to a "structured" environment. He denies suicidal, homicidal ideations. He denies auditory visual hallucinations or paranoia. Discussed increasing hydroxyzine from 25mg  to 50mg  oral every 6 hours PRN anxiety and he is in agreement. Pt also reports poor sleep. Discussed PRN trazodone is available and he states he is willing to try it tonight. Pt is currently on parole. 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   Principal Problem: Major depressive disorder, recurrent episode, severe, with psychosis (HCC) Diagnosis: Principal Problem:   Major depressive disorder, recurrent episode, severe, with psychosis (HCC) Active Problems:   PTSD (post-traumatic stress disorder)   Insomnia  Total Time spent with patient:  25 minutes  Past Psychiatric History: alcohol abuse, cocaine abuse   Past Medical History: History reviewed. No pertinent past medical history. History reviewed. No pertinent surgical history. Family History: History reviewed. No pertinent family history. Family Psychiatric  History: none reported Social History:  Social History   Substance and Sexual Activity  Alcohol Use Yes     Social History   Substance and Sexual Activity  Drug Use No    Social History   Socioeconomic History   Marital status: Single    Spouse name: Not on file   Number of children: Not on file   Years of education: Not on file   Highest education level: Not on file  Occupational History   Not on file  Tobacco Use   Smoking status: Some Days    Current packs/day: 1.00    Average packs/day: 1 pack/day for 8.9 years (8.9 ttl pk-yrs)    Types: Cigarettes     Start date: 2016   Smokeless tobacco: Never  Substance and Sexual Activity   Alcohol use: Yes   Drug use: No   Sexual activity: Not Currently  Other Topics Concern   Not on file  Social History Narrative   Not on file   Social Determinants of Health   Financial Resource Strain: Not on file  Food Insecurity: No Food Insecurity (10/09/2023)   Hunger Vital Sign    Worried About Running Out of Food in the Last Year: Never true    Ran Out of Food in the Last Year: Never true  Transportation Needs: No Transportation Needs (10/09/2023)   PRAPARE - Administrator, Civil Service (Medical): No    Lack of Transportation (Non-Medical): No  Physical Activity: Not on file  Stress: Not on file  Social Connections: Not on file   Additional Social History:                         Sleep: Poor  Appetite:  Fair  Current Medications: Current Facility-Administered Medications  Medication Dose Route Frequency Provider Last Rate Last Admin   acetaminophen (TYLENOL) tablet 650 mg  650 mg Oral Q6H PRN [name], NP   650 mg at 10/16/23 1035   alum & mag hydroxide-simeth (MAALOX/MYLANTA) 200-200-20 MG/5ML suspension 30 mL  30 mL Oral Q4H PRN [name], NP   30 mL at 10/21/23 1106   diphenhydrAMINE (BENADRYL) capsule 50 mg  50 mg Oral TID PRN Lerry Liner  M, NP       Or   diphenhydrAMINE (BENADRYL) injection 50 mg  50 mg Intramuscular TID PRN Durwin Nora, [name], NP       escitalopram (LEXAPRO) tablet 30 mg  30 mg Oral Daily [name], NP   30 mg at 10/21/23 1610   gabapentin (NEURONTIN) capsule 300 mg  300 mg Oral TID Charm Rings, NP   300 mg at 10/21/23 1231   haloperidol (HALDOL) tablet 5 mg  5 mg Oral TID PRN [name], NP       Or   haloperidol lactate (HALDOL) injection 5 mg  5 mg Intramuscular TID PRN [name], NP       hydrOXYzine (ATARAX) tablet 50 mg  50 mg Oral Q6H PRN Lauree Chandler, NP       LORazepam (ATIVAN) tablet 2 mg  2  mg Oral TID PRN [name], NP       magnesium hydroxide (MILK OF MAGNESIA) suspension 30 mL  30 mL Oral Daily PRN [name], NP   30 mL at 10/15/23 0841   mirtazapine (REMERON) tablet 15 mg  15 mg Oral QHS [name], NP   15 mg at 10/20/23 2109   QUEtiapine (SEROQUEL) tablet 300 mg  300 mg Oral QHS [name], NP   300 mg at 10/20/23 2109   traZODone (DESYREL) tablet 100 mg  100 mg Oral QHS PRN Charm Rings, NP   100 mg at 10/13/23 2114    Lab Results: No results found for this or any previous visit (from the past 48 hour(s)).  Blood Alcohol level:  Lab Results  Component Value Date   ETH <10 10/08/2023   ETH 215 (H) 04/22/2017    Metabolic Disorder Labs: Lab Results  Component Value Date   HGBA1C 4.9 10/10/2023   MPG 93.93 10/10/2023   No results found for: "PROLACTIN" Lab Results  Component Value Date   CHOL 214 (H) 10/10/2023   TRIG 135 10/10/2023   HDL 41 10/10/2023   CHOLHDL 5.2 10/10/2023   VLDL 27 10/10/2023   LDLCALC 146 (H) 10/10/2023    Physical Findings: AIMS:  , ,  ,  ,    CIWA:    COWS:     Musculoskeletal: Strength & Muscle Tone: within normal limits Gait & Station: normal Patient leans: N/A  Psychiatric Specialty Exam:  Presentation  General Appearance:  Appropriate for Environment  Eye Contact: Good  Speech: Clear and Coherent; Normal Rate  Speech Volume: Normal  Handedness: Right   Mood and Affect  Mood: Anxious  Affect: Appropriate; Full Range   Thought Process  Thought Processes: Coherent; Goal Directed; Linear  Descriptions of Associations:Intact  Orientation:Full (Time, Place and Person)  Thought Content:Logical  History of Schizophrenia/Schizoaffective disorder:No  Duration of Psychotic Symptoms:N/A  Hallucinations:Hallucinations: None Description of Auditory Hallucinations: denies  Ideas of Reference:None  Suicidal Thoughts:Suicidal Thoughts: No SI Passive Intent and/or Plan: --  (denies)  Homicidal Thoughts:Homicidal Thoughts: No   Sensorium  Memory: Immediate Good  Judgment: Good  Insight: Fair   Executive Functions  Concentration: Good  Attention Span: Good  Recall: Good  Fund of Knowledge: Good  Language: Good   Psychomotor Activity  Psychomotor Activity: Psychomotor Activity: Normal   Assets  Assets: Communication Skills; Desire for Improvement; Financial Resources/Insurance; Resilience   Sleep  Sleep: Sleep: Poor Number of Hours of Sleep: 8    Physical Exam: Physical Exam Constitutional:  General: He is not in acute distress.    Appearance: He is not ill-appearing, toxic-appearing or diaphoretic.  Eyes:     General: No scleral icterus. Cardiovascular:     Rate and Rhythm: Normal rate.  Pulmonary:     Effort: Pulmonary effort is normal. No respiratory distress.  Neurological:     Mental Status: He is alert and oriented to person, place, and time.  Psychiatric:        Attention and Perception: Attention and perception normal.        Mood and Affect: Affect normal. Mood is anxious.        Speech: Speech normal.        Behavior: Behavior normal. Behavior is cooperative.        Thought Content: Thought content normal.        Cognition and Memory: Cognition and memory normal.    Review of Systems  Constitutional:  Negative for chills and fever.  Respiratory:  Negative for shortness of breath.   Cardiovascular:  Negative for chest pain and palpitations.  Gastrointestinal:  Negative for abdominal pain.  Neurological:  Negative for headaches.  Psychiatric/Behavioral:  The patient is nervous/anxious and has insomnia.    Blood pressure (!) 99/44, pulse 72, temperature 97.7 F (36.5 C), resp. rate 18, height 5\' 8"  (1.727 m), weight 79.4 kg, SpO2 97%. Body mass index is 26.61 kg/m.   Treatment Plan Summary: Daily contact with patient to assess and evaluate symptoms and progress in treatment, Medication  management, and Plan    -Continue Benadryl 50mg  oral or IM 3 times daily Prn agitation -Continue lexapro 30mg  oral daily -Continue gabapentin 300mg  oral 3 times daily -Continue haldol 5mg  oral or IM 3 times daily PRN agitation -Increase hydroxyzine from 25mg  to 50mg  oral every 6 hours PRN anxiety -Continue ativan 2mg  oral 3 times daily PRN agitation -Continue remeron 15mg  oral daily at bedtime -Continue seroquel 300mg  oral daily at bedtime -Continue trazodone 100mg  oral at bedtime PRN sleep  Lauree Chandler, NP 10/21/2023, 3:01 PM

## 2023-10-21 NOTE — [facility] Counselor (Addendum)
ADDENDUM  CSW called again and spoke with Summer.  Summer reports that referral still has not been reviewed.  Requests that this CSW call back tomorrow for another update.  Penni Homans, MSW, LCSW 10/21/2023 4:21 PM   CSW followed [facility] with [facility] and had to leave a HIPAA compliant voicemail.  Penni Homans, MSW, LCSW 10/21/2023 3:16 PM

## 2023-10-21 NOTE — Group Note (Signed)
Date:  10/21/2023 Time:  10:11 AM  Group Topic/Focus:  Goals Group:   The focus of this group is to help patients establish daily goals to achieve during treatment and discuss how the patient can incorporate goal setting into their daily lives to aide in recovery. Relapse Prevention Planning:   The focus of this group is to define relapse and discuss the need for planning to combat relapse.    Participation Level:  Did Not Attend   Jeremiah Newman 10/21/2023, 10:11 AM

## 2023-10-21 NOTE — Group Note (Signed)
Date:  10/21/2023 Time:  5:22 PM  Group Topic/Focus:  Healthy Communication:   The focus of this group is to discuss communication, barriers to communication, as well as healthy ways to communicate with others. Outdoor recreation structured activity Outdoor therapy and exercise   Participation Level:  Active  Participation Quality:  Appropriate  Affect:  Appropriate  Cognitive:  Appropriate  Insight: Appropriate  Engagement in Group:  Developing/Improving and Engaged  Modes of Intervention:  Activity  Additional Comments:    Konstance Happel 10/21/2023, 5:22 PM

## 2023-10-21 NOTE — Plan of Care (Signed)
Patient verbalized anxiety multiple times but appropriate with staff & peers. Vistaril x 2 with an increased dose. Patients goal is to go for the rehab program. Patient denies SI,HI and AVH. Attended groups. Appetite and energy level good. Support and encouragement given.

## 2023-10-21 NOTE — Group Note (Signed)
Recreation Therapy Group Note   Group Topic:Coping Skills  Group Date: 10/21/2023 Start Time: 1000 End Time: 1050 Facilitators: Rosina Lowenstein, LRT, CTRS Location:  Craft Room  Group Description: Mind Map.  Patient was provided a blank template of a diagram with 32 blank boxes in a tiered system, branching from the center (similar to a bubble chart). LRT directed patients to label the middle of the diagram "Coping Skills". LRT and patients then came up with 8 different coping skills as examples. Pt were directed to record their coping skills in the 2nd tier boxes closest to the center.  Patients would then share their coping skills with the group as LRT wrote them out. LRT gave a handout of 99 different coping skills at the end of group.   Goal Area(s) Addressed: Patients will be able to define "coping skills". Patient will identify new coping skills.  Patient will increase communication.   Affect/Mood: N/A   Participation Level: Did not attend    Clinical Observations/Individualized Feedback: Jeremiah Newman did not attend group.   Plan: Continue to engage patient in RT group sessions 2-3x/week.   Rosina Lowenstein, LRT, CTRS 10/21/2023 12:34 PM

## 2023-10-22 DIAGNOSIS — F333 Major depressive disorder, recurrent, severe with psychotic symptoms: Secondary | ICD-10-CM | POA: Diagnosis not present

## 2023-10-22 MED ORDER — HYDROXYZINE HCL 25 MG PO TABS: 25.0000 mg | ORAL_TABLET | Freq: Three times a day (TID) | ORAL | Status: DC | PRN

## 2023-10-22 MED ORDER — ESCITALOPRAM OXALATE 10 MG PO TABS
20.0000 mg | ORAL_TABLET | Freq: Every day | ORAL | Status: DC
  Administered 2023-10-23: 20 mg via ORAL
  Filled 2023-10-22: qty 2

## 2023-10-22 NOTE — Group Note (Signed)
Date:  10/22/2023 Time:  12:53 AM  Group Topic/Focus:  Wrap-Up Group:   The focus of this group is to help patients review their daily goal of treatment and discuss progress on daily workbooks.    Participation Level:  Active  Participation Quality:  Appropriate and Attentive  Affect:  Appropriate  Cognitive:  Alert and Appropriate  Insight: Appropriate and Good  Engagement in Group:  Engaged and Supportive  Modes of Intervention:  Discussion  Additional Comments:     [name],Caris Cerveny E 10/22/2023, 12:53 AM

## 2023-10-22 NOTE — Group Note (Signed)
Date:  10/22/2023 Time:  9:08 PM  Group Topic/Focus:  Managing Feelings:   The focus of this group is to identify what feelings patients have difficulty handling and develop a plan to handle them in a healthier way upon discharge. Wrap-Up Group:   The focus of this group is to help patients review their daily goal of treatment and discuss progress on daily workbooks.    Participation Level:  Active  Participation Quality:  Appropriate and Attentive  Affect:  Appropriate and Excited  Cognitive:  Alert and Appropriate  Insight: Appropriate, Good, and Improving  Engagement in Group:  Developing/Improving and Engaged  Modes of Intervention:  Clarification, Discussion, Education, Rapport Building, Socialization, and Support  Additional Comments:     Yovan Leeman 10/22/2023, 9:08 PM

## 2023-10-22 NOTE — Plan of Care (Signed)
  Problem: Education: Goal: Knowledge of  General Education information/materials will improve Outcome: Adequate for Discharge Goal: Emotional status will improve Outcome: Adequate for Discharge Goal: Mental status will improve Outcome: Adequate for Discharge Goal: Verbalization of understanding the information provided will improve Outcome: Adequate for Discharge   Problem: Activity: Goal: Interest or engagement in activities will improve Outcome: Adequate for Discharge Goal: Sleeping patterns will improve Outcome: Adequate for Discharge   Problem: Coping: Goal: Ability to verbalize frustrations and anger appropriately will improve Outcome: Adequate for Discharge Goal: Ability to demonstrate self-control will improve Outcome: Adequate for Discharge   Problem: Safety: Goal: Periods of time without injury will increase Outcome: Adequate for Discharge

## 2023-10-22 NOTE — Group Note (Signed)
Date:  10/22/2023 Time:  10:17 AM  Group Topic/Focus:  Goals Group:   The focus of this group is to help patients establish daily goals to achieve during treatment and discuss how the patient can incorporate goal setting into their daily lives to aide in recovery.    Participation Level:  Did Not Attend   Lynelle [facility] 10/22/2023, 10:17 AM

## 2023-10-22 NOTE — Group Note (Signed)
Date:  10/22/2023 Time:  2:12 PM  Group Topic/Focus:  Healthy Communication:   The focus of this group is to discuss communication, barriers to communication, as well as healthy ways to communicate with others. OUTDOOR RECREATION STRUCTURED ACTIVITY    Participation Level:  Active  Participation Quality:  Appropriate  Affect:  Appropriate  Cognitive:  Alert, Appropriate, and Oriented  Insight: Appropriate  Engagement in Group:  Developing/Improving and Engaged  Modes of Intervention:  Activity, Discussion, and Socialization  Additional Comments:    Rosaura Carpenter 10/22/2023, 2:12 PM

## 2023-10-22 NOTE — Progress Notes (Signed)
   10/22/23 1500  Spiritual Encounters  Type of Visit Follow up  Care provided to: Patient  Referral source Chaplain assessment  Reason for visit Routine spiritual support  OnCall Visit No  Spiritual Framework  Presenting Themes Impactful experiences and emotions   Chaplain met with the patient in the dayroom while he was watching videos. Patient shared with the chaplain that he was watching Shon Baton and [employer] "Believe". Patient talked to the chaplain about his relationship with God and what the song means to him.

## 2023-10-22 NOTE — Plan of Care (Signed)
  Problem: Education: Goal: Emotional status will improve Outcome: Progressing Goal: Mental status will improve Outcome: Progressing   Problem: Activity: Goal: Interest or engagement in activities will improve Outcome: Progressing   Problem: Safety: Goal: Periods of time without injury will increase Outcome: Progressing

## 2023-10-22 NOTE — Progress Notes (Signed)
Pleasant and cooperative with care. Denies SI, HI, AVH. Endorses anxiety this shift. Prn given with good relief. Visible in dayroom socializing with staff and peers. Encouragement and support provided. Safety checks maintained. Medications given as prescribed. Pt receptive and remains safe on unit with q 15 min checks.

## 2023-10-22 NOTE — Progress Notes (Signed)
[facility] MD Progress Note  10/22/2023 4:28 PM MOHMMAD THISSELL  MRN:  [id number]  Subjective:  Pt chart reviewed, discussed with interdisciplinary team, and seen on rounds.The client reported low depression with no suicidal ideations.  Low anxiety with no panic attacks.  His sleep was "alright", appetite is "fair".  He is interviewing for a recovery place in the am with the hopes to transfer tomorrow.  If not, he will discharge home and continue his care outpatient.  Dillinger has been interacting in the milieu appropriately with staff and peers.  Principal Problem: Major depressive disorder, recurrent episode, severe, with psychosis (HCC) Diagnosis: Principal Problem:   Major depressive disorder, recurrent episode, severe, with psychosis (HCC) Active Problems:   PTSD (post-traumatic stress disorder)   Insomnia  Total Time spent with patient:  25 minutes  Past Psychiatric History: alcohol abuse, cocaine abuse   Past Medical History: History reviewed. No pertinent past medical history. History reviewed. No pertinent surgical history. Family History: History reviewed. No pertinent family history. Family Psychiatric  History: none reported Social History:  Social History   Substance and Sexual Activity  Alcohol Use Yes     Social History   Substance and Sexual Activity  Drug Use No    Social History   Socioeconomic History   Marital status: Single    Spouse name: Not on file   Number of children: Not on file   Years of education: Not on file   Highest education level: Not on file  Occupational History   Not on file  Tobacco Use   Smoking status: Some Days    Current packs/day: 1.00    Average packs/day: 1 pack/day for 8.9 years (8.9 ttl pk-yrs)    Types: Cigarettes    Start date: 2016   Smokeless tobacco: Never  Substance and Sexual Activity   Alcohol use: Yes   Drug use: No   Sexual activity: Not Currently  Other Topics Concern   Not on file  Social History Narrative    Not on file   Social Determinants of Health   Financial Resource Strain: Not on file  Food Insecurity: No Food Insecurity (10/09/2023)   Hunger Vital Sign    Worried About Running Out of Food in the Last Year: Never true    Ran Out of Food in the Last Year: Never true  Transportation Needs: No Transportation Needs (10/09/2023)   PRAPARE - Administrator, Civil Service (Medical): No    Lack of Transportation (Non-Medical): No  Physical Activity: Not on file  Stress: Not on file  Social Connections: Not on file   Additional Social History: continuing his care at [facility]       Sleep: Fair  Appetite:  Good  Current Medications: Current Facility-Administered Medications  Medication Dose Route Frequency Provider Last Rate Last Admin   acetaminophen (TYLENOL) tablet 650 mg  650 mg Oral Q6H PRN [name], NP   650 mg at 10/16/23 1035   alum & mag hydroxide-simeth (MAALOX/MYLANTA) 200-200-20 MG/5ML suspension 30 mL  30 mL Oral Q4H PRN [name], NP   30 mL at 10/21/23 1106   diphenhydrAMINE (BENADRYL) capsule 50 mg  50 mg Oral TID PRN [name], NP       Or   diphenhydrAMINE (BENADRYL) injection 50 mg  50 mg Intramuscular TID PRN [name], NP       [START ON 10/23/2023] escitalopram (LEXAPRO) tablet 20 mg  20 mg Oral Daily  Charm Rings, NP       gabapentin (NEURONTIN) capsule 300 mg  300 mg Oral TID Charm Rings, NP   300 mg at 10/22/23 1229   haloperidol (HALDOL) tablet 5 mg  5 mg Oral TID PRN [name], NP       Or   haloperidol lactate (HALDOL) injection 5 mg  5 mg Intramuscular TID PRN [name], NP       hydrOXYzine (ATARAX) tablet 25 mg  25 mg Oral TID PRN Charm Rings, NP       magnesium hydroxide (MILK OF MAGNESIA) suspension 30 mL  30 mL Oral Daily PRN [name], NP   30 mL at 10/15/23 0841   mirtazapine (REMERON) tablet 15 mg  15 mg Oral QHS [name], NP   15 mg at 10/21/23 2106   pantoprazole  (PROTONIX) EC tablet 40 mg  40 mg Oral Daily Lauree Chandler, NP   40 mg at 10/22/23 5956   QUEtiapine (SEROQUEL) tablet 300 mg  300 mg Oral QHS [name], NP   300 mg at 10/21/23 2106   traZODone (DESYREL) tablet 100 mg  100 mg Oral QHS PRN Charm Rings, NP   100 mg at 10/13/23 2114    Lab Results: No results found for this or any previous visit (from the past 48 hour(s)).  Blood Alcohol level:  Lab Results  Component Value Date   ETH <10 10/08/2023   ETH 215 (H) 04/22/2017    Metabolic Disorder Labs: Lab Results  Component Value Date   HGBA1C 4.9 10/10/2023   MPG 93.93 10/10/2023   No results found for: "PROLACTIN" Lab Results  Component Value Date   CHOL 214 (H) 10/10/2023   TRIG 135 10/10/2023   HDL 41 10/10/2023   CHOLHDL 5.2 10/10/2023   VLDL 27 10/10/2023   LDLCALC 146 (H) 10/10/2023    Physical Findings: AIMS:  , ,  ,  ,    CIWA:    COWS:     Musculoskeletal: Strength & Muscle Tone: within normal limits Gait & Station: normal Patient leans: N/A  Psychiatric Specialty Exam: Physical Exam Vitals and nursing note reviewed.  Constitutional:      General: He is not in acute distress.    Appearance: Normal appearance. He is not ill-appearing, toxic-appearing or diaphoretic.  HENT:     Head: Normocephalic.     Nose: Nose normal.  Eyes:     General: No scleral icterus. Pulmonary:     Effort: Pulmonary effort is normal. No respiratory distress.  Musculoskeletal:        General: Normal range of motion.     Cervical back: Normal range of motion.  Neurological:     General: No focal deficit present.     Mental Status: He is alert and oriented to person, place, and time.  Psychiatric:        Attention and Perception: Attention and perception normal.        Mood and Affect: Affect normal. Mood is anxious.        Speech: Speech normal.        Behavior: Behavior normal. Behavior is cooperative.        Thought Content: Thought content normal.         Cognition and Memory: Cognition and memory normal.     Review of Systems  Constitutional:  Negative for chills and fever.  Respiratory:  Negative for shortness of breath.  Cardiovascular:  Negative for chest pain and palpitations.  Gastrointestinal:  Negative for abdominal pain.  Neurological:  Negative for headaches.  Psychiatric/Behavioral:  Positive for depression. The patient is nervous/anxious.   All other systems reviewed and are negative.   Blood pressure 112/63, pulse 71, temperature 98 F (36.7 C), resp. rate 18, height 5\' 8"  (1.727 m), weight 79.4 kg, SpO2 97%.Body mass index is 26.61 kg/m.  General Appearance: Casual  Eye Contact:  Good  Speech:  Normal Rate  Volume:  Normal  Mood:  Anxious and Depressed  Affect:  Congruent  Thought Process:  Coherent  Orientation:  Full (Time, Place, and Person)  Thought Content:  WDL and Logical  Suicidal Thoughts:  No  Homicidal Thoughts:  No  Memory:  Immediate;   Good Recent;   Good Remote;   Good  Judgement:  Fair  Insight:  Fair  Psychomotor Activity:  Normal  Concentration:  Concentration: Good and Attention Span: Good  Recall:  Good  Fund of Knowledge:  Good  Language:  Good  Akathisia:  No  Handed:  Right  AIMS (if indicated):     Assets:  Housing Leisure Time Physical Health Resilience Social Support  ADL's:  Intact  Cognition:  WNL  Sleep:         Physical Exam: Physical Exam Vitals and nursing note reviewed.  Constitutional:      General: He is not in acute distress.    Appearance: Normal appearance. He is not ill-appearing, toxic-appearing or diaphoretic.  HENT:     Head: Normocephalic.     Nose: Nose normal.  Eyes:     General: No scleral icterus. Pulmonary:     Effort: Pulmonary effort is normal. No respiratory distress.  Musculoskeletal:        General: Normal range of motion.     Cervical back: Normal range of motion.  Neurological:     General: No focal deficit present.     Mental  Status: He is alert and oriented to person, place, and time.  Psychiatric:        Attention and Perception: Attention and perception normal.        Mood and Affect: Affect normal. Mood is anxious.        Speech: Speech normal.        Behavior: Behavior normal. Behavior is cooperative.        Thought Content: Thought content normal.        Cognition and Memory: Cognition and memory normal.    Review of Systems  Constitutional:  Negative for chills and fever.  Respiratory:  Negative for shortness of breath.   Cardiovascular:  Negative for chest pain and palpitations.  Gastrointestinal:  Negative for abdominal pain.  Neurological:  Negative for headaches.  Psychiatric/Behavioral:  Positive for depression. The patient is nervous/anxious.   All other systems reviewed and are negative.  Blood pressure 112/63, pulse 71, temperature 98 F (36.7 C), resp. rate 18, height 5\' 8"  (1.727 m), weight 79.4 kg, SpO2 97%. Body mass index is 26.61 kg/m.   Treatment Plan Summary: Daily contact with patient to assess and evaluate symptoms and progress in treatment, Medication management, and Plan   Major depressive disorder, recurrent, severe without psychosis: Lexapro 20 mg daily  Anxiety: Gabapentin 300 mg TID  Insomnia: Remeron 15 mg daily at bedtime Seroquel 30 mg at bedtime Trazodone 100 mg at bedtime PRN sleep  Nanine Means, NP 10/22/2023, 4:28 PM

## 2023-10-22 NOTE — Progress Notes (Signed)
   10/22/23 1221  Psych Admission Type (Psych Patients Only)  Admission Status Voluntary  Psychosocial Assessment  Patient Complaints None  Eye Contact Brief  Facial Expression Animated  Affect Appropriate to circumstance  Speech Slow  Interaction Assertive  Motor Activity Slow  Appearance/Hygiene Unremarkable  Behavior Characteristics Cooperative  Mood Pleasant  Thought Process  Coherency WDL  Content WDL  Delusions None reported or observed  Perception WDL  Hallucination None reported or observed  Judgment Impaired  Confusion None  Danger to Self  Current suicidal ideation? Denies  Agreement Not to Harm Self Yes  Description of Agreement verbal  Danger to Others  Danger to Others None reported or observed  Danger to Others Abnormal  Harmful Behavior to others No threats or harm toward other people  Destructive Behavior No threats or harm toward property

## 2023-10-22 NOTE — Clinical Social Work Note (Signed)
CSW called [facility] to follow up on patient's referral. Admission's coordinator, Summer confirmed receipt of application and has reviewed application.   Summer requested that addendum be made to patient's medications to include exact details of prescriptions and administration. CSW provided Nurse practitioner with needed form to complete to rectify medications. NP to complete on patient's behalf.   Summer added that once this is rectified, she can work to schedule a phone interview with the patient.   Reymundo Poll, MSW, LCSWA 10/22/2023 3:17 PM

## 2023-10-22 NOTE — Group Note (Signed)
Recreation Therapy Group Note   Group Topic:Problem Solving  Group Date: 10/22/2023 Start Time: 1005 End Time: 1100 Facilitators: Rosina Lowenstein, LRT, CTRS Location:  Craft Room  Group Description: Life Boat. Patients were given the scenario that they are on a boat that is about to become shipwrecked, leaving them stranded on an [place]. They are asked to make a list of 15 different items that they want to take with them when they are stranded on the [place]. Patients are asked to rank their items from most important to least important, #1 being the most important and #15 being the least. Patients will work individually for the first round to come up with 15 items and then pair up with a peer(s) to condense their list and come up with one list of 15 items between the two of them. Patients or LRT will read aloud the 15 different items to the group after each round. LRT facilitated post-activity processing to discuss how this activity can be used in daily life post discharge.   Goal Area(s) Addressed:  Patient will identify priorities, wants and needs. Patient will communicate with LRT and peers. Patient will work collectively as a Administrator, Civil Service. Patient will work on Product manager.    Affect/Mood: N/A   Participation Level: Did not attend    Clinical Observations/Individualized Feedback: Jeremiah Newman did not attend group.   Plan: Continue to engage patient in RT group sessions 2-3x/week.   Rosina Lowenstein, LRT, CTRS 10/22/2023 11:55 AM

## 2023-10-22 NOTE — Progress Notes (Signed)
Assumed care of patient this pm, he  present A&O x 4, affect/mood pleasant, cooperative  Pt out in the milieu, sociable with select peers, behavior appropriate to situation and he was without complaints. Pt denied thoughts, plan or intent to harm self or others and made a safety commitment, Pt focused on going to a long-term treatment center tomorrow and he stated he's looking forward "getting my life together" Pt is  compliant with taking medications and treatment plan , Pt educated on plan of care, medication regimen and he acknowledged an understanding and was without further complaints or concerns. Pt also completed his safety plan this evening.

## 2023-10-22 NOTE — Group Note (Signed)
LCSW Group Therapy Note  Group Date: 10/22/2023 Start Time: 1300 End Time: 1400   Type of Therapy and Topic:  Group Therapy: Anger Cues and Responses  Participation Level:  Active   Description of Group:   In this group, patients learned how to recognize the physical, cognitive, emotional, and behavioral responses they have to anger-provoking situations.  They identified a recent time they became angry and how they reacted.  They analyzed how their reaction was possibly beneficial and how it was possibly unhelpful.  The group discussed a variety of healthier coping skills that could help with such a situation in the future.  Focus was placed on how helpful it is to recognize the underlying emotions to our anger, because working on those can lead to a more permanent solution as well as our ability to focus on the important rather than the urgent.  Therapeutic Goals: Patients will remember their last incident of anger and how they felt emotionally and physically, what their thoughts were at the time, and how they behaved. Patients will identify how their behavior at that time worked for them, as well as how it worked against them. Patients will explore possible new behaviors to use in future anger situations. Patients will learn that anger itself is normal and cannot be eliminated, and that healthier reactions can assist with resolving conflict rather than worsening situations.  Summary of Patient Progress:   Patient was active during the group. Patient was able to engage in a discussion on how anger can affect someone.  He was able to discuss how anger has affected him and how he has been able to use coping skills.  He was able to identify what he does.  He was supportive of other group members and showed fair insight.  He was respectful of peers, and participated throughout the entire session.  Therapeutic Modalities:   Cognitive Behavioral Therapy    Claudie Fisherman 10/22/2023  2:44 PM

## 2023-10-23 DIAGNOSIS — F333 Major depressive disorder, recurrent, severe with psychotic symptoms: Secondary | ICD-10-CM | POA: Diagnosis not present

## 2023-10-23 MED ORDER — QUETIAPINE FUMARATE 200 MG PO TABS: 200.0000 mg | ORAL_TABLET | Freq: Every day | ORAL | 0 refills | Status: DC

## 2023-10-23 MED ORDER — HYDROXYZINE HCL 25 MG PO TABS: 25.0000 mg | ORAL_TABLET | Freq: Three times a day (TID) | ORAL | 0 refills | Status: AC | PRN

## 2023-10-23 MED ORDER — GABAPENTIN 300 MG PO CAPS: 300.0000 mg | ORAL_CAPSULE | Freq: Three times a day (TID) | ORAL | 0 refills | Status: DC

## 2023-10-23 MED ORDER — MIRTAZAPINE 7.5 MG PO TABS: 7.5000 mg | ORAL_TABLET | Freq: Every day | ORAL | 0 refills | Status: DC

## 2023-10-23 MED ORDER — TRAZODONE HCL 100 MG PO TABS: 100.0000 mg | ORAL_TABLET | Freq: Every evening | ORAL | 0 refills | Status: DC | PRN

## 2023-10-23 MED ORDER — PANTOPRAZOLE SODIUM 40 MG PO TBEC: 40.0000 mg | DELAYED_RELEASE_TABLET | Freq: Every day | ORAL | 0 refills | Status: DC

## 2023-10-23 MED ORDER — ESCITALOPRAM OXALATE 20 MG PO TABS: 20.0000 mg | ORAL_TABLET | Freq: Every day | ORAL | 0 refills | Status: DC

## 2023-10-23 NOTE — [facility] Suicide Risk Assessment (Signed)
[facility] Discharge Suicide Risk Assessment   Principal Problem: Major depressive disorder, recurrent episode, severe, with psychosis (HCC) Discharge Diagnoses: Principal Problem:   Major depressive disorder, recurrent episode, severe, with psychosis (HCC) Active Problems:   PTSD (post-traumatic stress disorder)   Insomnia   Total Time spent with patient: 45 minutes  Musculoskeletal: Strength & Muscle Tone: within normal limits Gait & Station: normal Patient leans: N/A  Psychiatric Specialty Exam: Physical Exam Vitals and nursing note reviewed.  Constitutional:      Appearance: Normal appearance.  HENT:     Head: Normocephalic.     Nose: Nose normal.  Pulmonary:     Effort: Pulmonary effort is normal.  Musculoskeletal:        General: Normal range of motion.     Cervical back: Normal range of motion.  Neurological:     General: No focal deficit present.     Mental Status: He is alert and oriented to person, place, and time.     Review of Systems  Psychiatric/Behavioral:  The patient is nervous/anxious.   All other systems reviewed and are negative.   Blood pressure 118/60, pulse 75, temperature 98.8 F (37.1 C), resp. rate (!) 21, height 5\' 8"  (1.727 m), weight 79.4 kg, SpO2 95%.Body mass index is 26.61 kg/m.  General Appearance: Casual  Eye Contact:  Good  Speech:  Normal Rate  Volume:  Normal  Mood:  Anxious  Affect:  Congruent  Thought Process:  Coherent  Orientation:  Full (Time, Place, and Person)  Thought Content:  WDL and Logical  Suicidal Thoughts:  No  Homicidal Thoughts:  No  Memory:  Immediate;   Good Recent;   Good Remote;   Good  Judgement:  Good  Insight:  Good  Psychomotor Activity:  Normal  Concentration:  Concentration: Good and Attention Span: Good  Recall:  Good  Fund of Knowledge:  Good  Language:  Good  Akathisia:  No  Handed:  Right  AIMS (if indicated):     Assets:  Housing Leisure Time Physical Health Resilience Social Support   ADL's:  Intact  Cognition:  WNL  Sleep:        Physical Exam: Physical Exam Vitals and nursing note reviewed.  Constitutional:      Appearance: Normal appearance.  HENT:     Head: Normocephalic.     Nose: Nose normal.  Pulmonary:     Effort: Pulmonary effort is normal.  Musculoskeletal:        General: Normal range of motion.     Cervical back: Normal range of motion.  Neurological:     General: No focal deficit present.     Mental Status: He is alert and oriented to person, place, and time.    Review of Systems  Psychiatric/Behavioral:  The patient is nervous/anxious.   All other systems reviewed and are negative.  Blood pressure 118/60, pulse 75, temperature 98.8 F (37.1 C), resp. rate (!) 21, height 5\' 8"  (1.727 m), weight 79.4 kg, SpO2 95%. Body mass index is 26.61 kg/m.  Mental Status Per Nursing Assessment::   On Admission:  NA  Demographic Factors:  Male and Caucasian  Loss Factors: NA  Historical Factors: NA  Risk Reduction Factors:   Sense of responsibility to family, Living with another person, especially a relative, and Positive social support  Continued Clinical Symptoms:  Anxiety, mild  Cognitive Features That Contribute To Risk:  None    Suicide Risk:  Minimal: No identifiable suicidal ideation.  Patients presenting  with no risk factors but with morbid ruminations; may be classified as minimal risk based on the severity of the depressive symptoms   Follow-up Information     [facility]. Call.   Why: Please call [phone] Friday, November 22nd at 10AM for your phone interview with Summer (Admissions coorindator).   The facility DOES NOT provide transporation. If accepted, you must arrange transporation to the location:  [facility] [address] [place], [place] [address]                Plan Of Care/Follow-up recommendations:  Activity:  as tolerated Diet:  heart healthy diet Major depressive disorder, recurrent,  severe without psychosis: Lexapro 20 mg daily   Anxiety: Gabapentin 300 mg TID   Insomnia: Remeron 15 mg daily at bedtime Seroquel 30 mg at bedtime Trazodone 100 mg at bedtime PRN sleep  Nanine Means, NP 10/23/2023, 10:33 AM

## 2023-10-23 NOTE — Discharge Summary (Signed)
Physician Discharge Summary Note  Patient:  Jeremiah Newman is an 32 y.o., male MRN:  [id number] DOB:  [birth year] Patient phone:  [phone] (home)  Patient address:   [address] [place] [address],  Total Time spent with patient: 45 minutes  Date of Admission:  10/09/2023 Date of Discharge: 10/23/2023  Reason for Admission:  suicidal ideations  Principal Problem: Major depressive disorder, recurrent episode, severe, with psychosis (HCC) Discharge Diagnoses: Principal Problem:   Major depressive disorder, recurrent episode, severe, with psychosis (HCC) Active Problems:   PTSD (post-traumatic stress disorder)   Insomnia   Past Psychiatric History: depression, anxiety, PTSD  Past Medical History: History reviewed. No pertinent past medical history. History reviewed. No pertinent surgical history. Family History: History reviewed. No pertinent family history. Family Psychiatric  History: none Social History:  Social History   Substance and Sexual Activity  Alcohol Use Yes     Social History   Substance and Sexual Activity  Drug Use No    Social History   Socioeconomic History   Marital status: Single    Spouse name: Not on file   Number of children: Not on file   Years of education: Not on file   Highest education level: Not on file  Occupational History   Not on file  Tobacco Use   Smoking status: Some Days    Current packs/day: 1.00    Average packs/day: 1 pack/day for 8.9 years (8.9 ttl pk-yrs)    Types: Cigarettes    Start date: 2016   Smokeless tobacco: Never  Substance and Sexual Activity   Alcohol use: Yes   Drug use: No   Sexual activity: Not Currently  Other Topics Concern   Not on file  Social History Narrative   Not on file   Social Determinants of Health   Financial Resource Strain: Not on file  Food Insecurity: No Food Insecurity (10/09/2023)   Hunger Vital Sign    Worried About Running Out of Food in the Last Year: Never true    Ran  Out of Food in the Last Year: Never true  Transportation Needs: No Transportation Needs (10/09/2023)   PRAPARE - Administrator, Civil Service (Medical): No    Lack of Transportation (Non-Medical): No  Physical Activity: Not on file  Stress: Not on file  Social Connections: Not on file    Hospital Course:   32 yo male admitted for suicidal ideations and difficulty adjusting outside of prison life.  He was admitted and medications along with therapy started with positive results, no depression and low level of anxiety.  Denies suicidal/homicidal ideations, hallucinations, and substance abuse.  Jeremiah Newman has met maximum benefit of hospitalizations.  Discharge instructions provided with explanations along with crisis numbers, Rx, and follow up appointment information.  He will attend his interview with [facility] on Friday with the hopes to continue his care there in residential.   Musculoskeletal: Strength & Muscle Tone: within normal limits Gait & Station: normal Patient leans: N/A   Psychiatric Specialty Exam: Physical Exam Vitals and nursing note reviewed.  Constitutional:      Appearance: Normal appearance.  HENT:     Head: Normocephalic.     Nose: Nose normal.  Pulmonary:     Effort: Pulmonary effort is normal.  Musculoskeletal:        General: Normal range of motion.     Cervical back: Normal range of motion.  Neurological:     General: No focal deficit present.  Mental Status: He is alert and oriented to person, place, and time.       Review of Systems  Psychiatric/Behavioral:  The patient is nervous/anxious.   All other systems reviewed and are negative.    Blood pressure 118/60, pulse 75, temperature 98.8 F (37.1 C), resp. rate (!) 21, height 5\' 8"  (1.727 m), weight 79.4 kg, SpO2 95%.Body mass index is 26.61 kg/m.  General Appearance: Casual  Eye Contact:  Good  Speech:  Normal Rate  Volume:  Normal  Mood:  Anxious  Affect:  Congruent  Thought  Process:  Coherent  Orientation:  Full (Time, Place, and Person)  Thought Content:  WDL and Logical  Suicidal Thoughts:  No  Homicidal Thoughts:  No  Memory:  Immediate;   Good Recent;   Good Remote;   Good  Judgement:  Good  Insight:  Good  Psychomotor Activity:  Normal  Concentration:  Concentration: Good and Attention Span: Good  Recall:  Good  Fund of Knowledge:  Good  Language:  Good  Akathisia:  No  Handed:  Right  AIMS (if indicated):     Assets:  Housing Leisure Time Physical Health Resilience Social Support  ADL's:  Intact  Cognition:  WNL  Sleep:        Physical Exam: Physical Exam Vitals and nursing note reviewed.  Constitutional:      Appearance: Normal appearance.  HENT:     Head: Normocephalic.     Nose: Nose normal.  Pulmonary:     Effort: Pulmonary effort is normal.  Musculoskeletal:        General: Normal range of motion.     Cervical back: Normal range of motion.  Neurological:     General: No focal deficit present.     Mental Status: He is alert and oriented to person, place, and time.    Review of Systems  Psychiatric/Behavioral:  The patient is nervous/anxious.   All other systems reviewed and are negative.  Blood pressure 118/60, pulse 75, temperature 98.8 F (37.1 C), resp. rate (!) 21, height 5\' 8"  (1.727 m), weight 79.4 kg, SpO2 95%. Body mass index is 26.61 kg/m.   Social History   Tobacco Use  Smoking Status Some Days   Current packs/day: 1.00   Average packs/day: 1 pack/day for 8.9 years (8.9 ttl pk-yrs)   Types: Cigarettes   Start date: 2016  Smokeless Tobacco Never   Tobacco Cessation:  A prescription for an FDA-approved tobacco cessation medication was offered at discharge and the patient refused   Blood Alcohol level:  Lab Results  Component Value Date   ETH <10 10/08/2023   ETH 215 (H) 04/22/2017    Metabolic Disorder Labs:  Lab Results  Component Value Date   HGBA1C 4.9 10/10/2023   MPG 93.93 10/10/2023    No results found for: "PROLACTIN" Lab Results  Component Value Date   CHOL 214 (H) 10/10/2023   TRIG 135 10/10/2023   HDL 41 10/10/2023   CHOLHDL 5.2 10/10/2023   VLDL 27 10/10/2023   LDLCALC 146 (H) 10/10/2023    See Psychiatric Specialty Exam and Suicide Risk Assessment completed by Attending Physician prior to discharge.  Discharge destination:  Home  Is patient on multiple antipsychotic therapies at discharge:  No   Has Patient had three or more failed trials of antipsychotic monotherapy by history:  No  Recommended Plan for Multiple Antipsychotic Therapies: NA  Discharge Instructions     Diet - low sodium heart healthy   Complete  by: As directed    Discharge instructions   Complete by: As directed    Follow up with outpatient provider   Increase activity slowly   Complete by: As directed       Allergies as of 10/23/2023   No Known Allergies      Medication List     TAKE these medications      Indication  escitalopram 20 MG tablet Commonly known as: LEXAPRO Take 1 tablet (20 mg total) by mouth daily.  Indication: Major Depressive Disorder   escitalopram 10 MG tablet Commonly known as: LEXAPRO Take 3 tablets (30 mg total) by mouth daily.  Indication: Major Depressive Disorder   escitalopram 20 MG tablet Commonly known as: LEXAPRO Take 1 tablet (20 mg total) by mouth daily. Start taking on: October 24, 2023  Indication: Major Depressive Disorder   gabapentin 300 MG capsule Commonly known as: NEURONTIN Take 1 capsule (300 mg total) by mouth 3 (three) times daily.  Indication: Abuse or Misuse of Alcohol, Alcohol Withdrawal Syndrome, Panic Disorder   hydrOXYzine 25 MG tablet Commonly known as: ATARAX Take 1 tablet (25 mg total) by mouth 3 (three) times daily as needed for anxiety.  Indication: Feeling Anxious   mirtazapine 7.5 MG tablet Commonly known as: REMERON Take 1 tablet (7.5 mg total) by mouth at bedtime.  Indication: insomnia    pantoprazole 40 MG tablet Commonly known as: PROTONIX Take 1 tablet (40 mg total) by mouth daily. Start taking on: October 24, 2023  Indication: Heartburn   QUEtiapine 200 MG tablet Commonly known as: SEROQUEL Take 1 tablet (200 mg total) by mouth at bedtime.  Indication: Trouble Sleeping, Posttraumatic Stress Disorder   traZODone 100 MG tablet Commonly known as: DESYREL Take 1 tablet (100 mg total) by mouth at bedtime as needed for sleep.  Indication: Trouble Sleeping, Major Depressive Disorder        Follow-up Information     [facility]. Call.   Why: Please call [phone] Friday, November 22nd at 10AM for your phone interview with Summer (Admissions coorindator).   The facility DOES NOT provide transporation. If accepted, you must arrange transporation to the location:  [facility] at [facility] [address] [place], [place] [address]                Follow-up recommendations:  Activity:  as tolerated Diet:  heart healthy diet  Major depressive disorder, recurrent, severe without psychosis: Lexapro 20 mg daily   Anxiety: Gabapentin 300 mg TID   Insomnia: Remeron 15 mg daily at bedtime Seroquel 30 mg at bedtime Trazodone 100 mg at bedtime PRN sleep  Comments:  follow up with [facility] on Monday  Signed: Nanine Means, NP 10/23/2023, 10:37 AM

## 2023-10-23 NOTE — Clinical Social Work Psychosocial (Signed)
CSW touched base with Summer, admissions coordinator with [facility].   Patient has been scheduled for phone interview with Summer for 10/25/23 at 10AM. Patient has been made aware of appointment details and that facility does not provide transportation. Summer added that the patient must test negative for substances.   Appointment details are:  Instructions: Please call [phone] Friday, November 22nd at 10AM for your phone interview with Summer (Admissions coorindator). The facility DOES NOT provide transporation. If accepted, you must arrange transporation to the location: [facility] at [facility] [address] [place], [place] [address]   Reymundo Poll, MSW, LCSWA 10/23/2023 10:16 AM

## 2023-10-23 NOTE — Group Note (Signed)
Date:  10/23/2023 Time:  11:25 AM  Group Topic/Focus:  Goals Group:   The focus of this group is to help patients establish daily goals to achieve during treatment and discuss how the patient can incorporate goal setting into their daily lives to aide in recovery.   Participation Level:  Active  Participation Quality:  Appropriate and Attentive  Affect:  Appropriate  Cognitive:  Alert and Appropriate  Insight: Appropriate  Engagement in Group:  Engaged and Supportive  Modes of Intervention:  Discussion and Education  Additional Comments:    Abi Shoults A Lucyann Romano 10/23/2023, 11:25 AM

## 2023-10-23 NOTE — Progress Notes (Signed)
Patient pleasant and cooperative on approach. Denies SI,HI and AVH. Verbalized understanding discharge instructions,prescriptions and follow up care. 30 days medicines given to patient. All belongings returned from [employer]. Suicide safety plan filled by patient and placed in chart. Copy given to patient.Patient escorted out by staff and transported by family.

## 2023-10-23 NOTE — Group Note (Signed)
Recreation Therapy Group Note   Group Topic:Emotion Expression  Group Date: 10/23/2023 Start Time: 1000 End Time: 1100 Facilitators: Rosina Lowenstein, LRT, CTRS Location:  Craft Room  Group Description: Positivity Collage. LRT and patients discussed the importance of having a positive mindset and being happy. Patients received magazines, safety scissors, a glue stick and a piece of paper. Pts were encouraged to find images or words in the magazines that showed "happiness" or positivity to them. Pt shared their collage with the group once they were finished. LRT and pts discussed how it can be difficult to always have a positive mindset, especially when they have mental health challenges.   Goal Area(s) Addressed:  Pt will identify things associate with positivity. Pt will reduce negative thinking. Pt will identify a new coping skill of thinking positive thoughts.    Affect/Mood: N/A   Participation Level: Did not attend    Clinical Observations/Individualized Feedback: Claiborne did not attend group.   Plan: Continue to engage patient in RT group sessions 2-3x/week.   Rosina Lowenstein, LRT, CTRS 10/23/2023 11:59 AM

## 2023-10-23 NOTE — Progress Notes (Signed)
  [facility] Adult Case Management Discharge Plan :  Will you be returning to the same living situation after discharge:  Yes,  pt reports that she is returning home. At discharge, do you have transportation home?: Yes,  pt reports that mother will provide transportation. Do you have the ability to pay for your medications: Yes,   MEDICAID [place] / MEDICAID [place]  Release of information consent forms completed and in the chart;  Patient's signature needed at discharge.  Patient to Follow up at:  Follow-up Information     [facility]. Call.   Why: Please call [phone] Friday, November 22nd at 10AM for your phone interview with Summer (Admissions coorindator).   The facility DOES NOT provide transporation. If accepted, you must arrange transporation to the location:  [facility] at [facility] [address] [place], [place] [address]                Next level of care provider has access to [facility] Link:no  Safety Planning and Suicide Prevention discussed: Yes,  SPE completed with the patient.      Has patient been referred to the Quitline?: Patient refused referral for treatment  Patient has been referred for addiction treatment: Yes, referral information given but appointment not made Patient has a phone interview at 10/25/2023 at 10AM. (list facility).  Harden Mo, LCSW 10/23/2023, 10:17 AM

## 2023-12-18 ENCOUNTER — Other Ambulatory Visit: Payer: Self-pay

## 2023-12-18 ENCOUNTER — Inpatient Hospital Stay
Admission: AD | Admit: 2023-12-18 | Discharge: 2024-01-01 | DRG: 885 | Disposition: A | Discharge status: Home / Self Care | Payer: 59 | Source: Intra-hospital | Attending: Psychiatry | Admitting: Psychiatry

## 2023-12-18 ENCOUNTER — Emergency Department
Admission: EM | Admit: 2023-12-18 | Discharge: 2023-12-18 | Disposition: A | Discharge status: Other Acute Inpatient Hospital | Payer: 59 | Attending: Emergency Medicine | Admitting: Emergency Medicine

## 2023-12-18 ENCOUNTER — Encounter: Payer: Self-pay | Admitting: Psychiatry

## 2023-12-18 DIAGNOSIS — R45851 Suicidal ideations: Secondary | ICD-10-CM | POA: Diagnosis present

## 2023-12-18 DIAGNOSIS — F29 Unspecified psychosis not due to a substance or known physiological condition: Secondary | ICD-10-CM | POA: Diagnosis present

## 2023-12-18 DIAGNOSIS — R12 Heartburn: Secondary | ICD-10-CM | POA: Diagnosis present

## 2023-12-18 DIAGNOSIS — Z9151 Personal history of suicidal behavior: Secondary | ICD-10-CM | POA: Diagnosis not present

## 2023-12-18 DIAGNOSIS — F250 Schizoaffective disorder, bipolar type: Secondary | ICD-10-CM | POA: Diagnosis present

## 2023-12-18 DIAGNOSIS — R443 Hallucinations, unspecified: Secondary | ICD-10-CM | POA: Diagnosis present

## 2023-12-18 DIAGNOSIS — Z5941 Food insecurity: Secondary | ICD-10-CM | POA: Diagnosis not present

## 2023-12-18 DIAGNOSIS — Z91148 Patient's other noncompliance with medication regimen for other reason: Secondary | ICD-10-CM | POA: Diagnosis not present

## 2023-12-18 DIAGNOSIS — F19959 Other psychoactive substance use, unspecified with psychoactive substance-induced psychotic disorder, unspecified: Secondary | ICD-10-CM | POA: Diagnosis present

## 2023-12-18 DIAGNOSIS — R45850 Homicidal ideations: Secondary | ICD-10-CM | POA: Diagnosis present

## 2023-12-18 DIAGNOSIS — F22 Delusional disorders: Secondary | ICD-10-CM | POA: Insufficient documentation

## 2023-12-18 DIAGNOSIS — F1520 Other stimulant dependence, uncomplicated: Secondary | ICD-10-CM | POA: Insufficient documentation

## 2023-12-18 DIAGNOSIS — F410 Panic disorder [episodic paroxysmal anxiety] without agoraphobia: Secondary | ICD-10-CM | POA: Diagnosis present

## 2023-12-18 DIAGNOSIS — Z5982 Transportation insecurity: Secondary | ICD-10-CM | POA: Diagnosis not present

## 2023-12-18 DIAGNOSIS — F17210 Nicotine dependence, cigarettes, uncomplicated: Secondary | ICD-10-CM | POA: Diagnosis present

## 2023-12-18 DIAGNOSIS — G4700 Insomnia, unspecified: Secondary | ICD-10-CM | POA: Diagnosis present

## 2023-12-18 DIAGNOSIS — Z79899 Other long term (current) drug therapy: Secondary | ICD-10-CM

## 2023-12-18 DIAGNOSIS — F1420 Cocaine dependence, uncomplicated: Secondary | ICD-10-CM | POA: Diagnosis not present

## 2023-12-18 DIAGNOSIS — F333 Major depressive disorder, recurrent, severe with psychotic symptoms: Secondary | ICD-10-CM | POA: Diagnosis not present

## 2023-12-18 DIAGNOSIS — F259 Schizoaffective disorder, unspecified: Secondary | ICD-10-CM | POA: Insufficient documentation

## 2023-12-18 DIAGNOSIS — F4310 Post-traumatic stress disorder, unspecified: Secondary | ICD-10-CM | POA: Diagnosis present

## 2023-12-18 DIAGNOSIS — F4323 Adjustment disorder with mixed anxiety and depressed mood: Secondary | ICD-10-CM | POA: Insufficient documentation

## 2023-12-18 LAB — COMPREHENSIVE METABOLIC PANEL
ALT: 20 U/L (ref 0–44)
AST: 33 U/L (ref 15–41)
Albumin: 4.9 g/dL (ref 3.5–5.0)
Alkaline Phosphatase: 79 U/L (ref 38–126)
Anion gap: 14 (ref 5–15)
BUN: 13 mg/dL (ref 6–20)
CO2: 21 mmol/L — ABNORMAL LOW (ref 22–32)
Calcium: 9.5 mg/dL (ref 8.9–10.3)
Chloride: 99 mmol/L (ref 98–111)
Creatinine, Ser: 1.03 mg/dL (ref 0.61–1.24)
GFR, Estimated: >60 mL/min (ref >60)
Glucose, Bld: 95 mg/dL (ref 70–99)
Potassium: 3.8 mmol/L (ref 3.5–5.1)
Sodium: 134 mmol/L — ABNORMAL LOW (ref 135–145)
Total Bilirubin: 1.5 mg/dL — ABNORMAL HIGH (ref 0.0–1.2)
Total Protein: 8.3 g/dL — ABNORMAL HIGH (ref 6.5–8.1)

## 2023-12-18 LAB — URINE DRUG SCREEN, QUALITATIVE (ARMC ONLY)
Amphetamines, Ur Screen: POSITIVE — AB
Barbiturates, Ur Screen: NOT DETECTED
Benzodiazepine, Ur Scrn: NOT DETECTED
Cannabinoid 50 Ng, Ur ~~LOC~~: POSITIVE — AB
Cocaine Metabolite,Ur ~~LOC~~: POSITIVE — AB
MDMA (Ecstasy)Ur Screen: NOT DETECTED
Methadone Scn, Ur: NOT DETECTED
Opiate, Ur Screen: NOT DETECTED
Phencyclidine (PCP) Ur S: NOT DETECTED
Tricyclic, Ur Screen: NOT DETECTED

## 2023-12-18 LAB — CBC WITH DIFFERENTIAL/PLATELET
Abs Immature Granulocytes: 0.03 10*3/uL (ref 0.00–0.07)
Basophils Absolute: 0 10*3/uL (ref 0.0–0.1)
Basophils Relative: 0 %
Eosinophils Absolute: 0 10*3/uL (ref 0.0–0.5)
Eosinophils Relative: 0 %
HCT: 45.4 % (ref 39.0–52.0)
Hemoglobin: 16.1 g/dL (ref 13.0–17.0)
Immature Granulocytes: 0 %
Lymphocytes Relative: 11 %
Lymphs Abs: 1.3 10*3/uL (ref 0.7–4.0)
MCH: 29.9 pg (ref 26.0–34.0)
MCHC: 35.5 g/dL (ref 30.0–36.0)
MCV: 84.2 fL (ref 80.0–100.0)
Monocytes Absolute: 0.9 10*3/uL (ref 0.1–1.0)
Monocytes Relative: 8 %
Neutro Abs: 9.4 10*3/uL — ABNORMAL HIGH (ref 1.7–7.7)
Neutrophils Relative %: 81 %
Platelets: 287 10*3/uL (ref 150–400)
RBC: 5.39 MIL/uL (ref 4.22–5.81)
RDW: 12.9 % (ref 11.5–15.5)
WBC: 11.7 10*3/uL — ABNORMAL HIGH (ref 4.0–10.5)
nRBC: 0 % (ref 0.0–0.2)

## 2023-12-18 LAB — SALICYLATE LEVEL: Salicylate Lvl: <7 mg/dL — ABNORMAL LOW (ref 7.0–30.0)

## 2023-12-18 LAB — ACETAMINOPHEN LEVEL: Acetaminophen (Tylenol), Serum: <10 ug/mL — ABNORMAL LOW (ref 10–30)

## 2023-12-18 LAB — ETHANOL: Alcohol, Ethyl (B): <10 mg/dL (ref <10)

## 2023-12-18 MED ORDER — LITHIUM CARBONATE 300 MG PO CAPS
300.0000 mg | ORAL_CAPSULE | Freq: Two times a day (BID) | ORAL | Status: DC
  Administered 2023-12-18 – 2023-12-30 (×25): 300 mg via ORAL
  Filled 2023-12-18 (×25): qty 1

## 2023-12-18 MED ORDER — HYDROXYZINE HCL 25 MG PO TABS: 25.0000 mg | ORAL_TABLET | Freq: Three times a day (TID) | ORAL | Status: DC | PRN

## 2023-12-18 MED ORDER — ALUM & MAG HYDROXIDE-SIMETH 200-200-20 MG/5ML PO SUSP: 30.0000 mL | ORAL | Status: DC | PRN

## 2023-12-18 MED ORDER — QUETIAPINE FUMARATE 200 MG PO TABS
200.0000 mg | ORAL_TABLET | Freq: Every day | ORAL | Status: DC
  Administered 2023-12-18 – 2023-12-20 (×2): 200 mg via ORAL
  Filled 2023-12-18 (×2): qty 1

## 2023-12-18 MED ORDER — DIPHENHYDRAMINE HCL 25 MG PO CAPS: 50.0000 mg | ORAL_CAPSULE | Freq: Three times a day (TID) | ORAL | Status: DC | PRN

## 2023-12-18 MED ORDER — DIPHENHYDRAMINE HCL 50 MG/ML IJ SOLN: 50.0000 mg | Freq: Three times a day (TID) | INTRAMUSCULAR | Status: DC | PRN

## 2023-12-18 MED ORDER — LITHIUM CARBONATE 300 MG PO CAPS: 300.0000 mg | ORAL_CAPSULE | Freq: Two times a day (BID) | ORAL | Status: DC

## 2023-12-18 MED ORDER — HALOPERIDOL LACTATE 5 MG/ML IJ SOLN: 10.0000 mg | Freq: Three times a day (TID) | INTRAMUSCULAR | Status: DC | PRN

## 2023-12-18 MED ORDER — LORAZEPAM 2 MG/ML IJ SOLN: 2.0000 mg | Freq: Three times a day (TID) | INTRAMUSCULAR | Status: DC | PRN

## 2023-12-18 MED ORDER — HYDROXYZINE HCL 25 MG PO TABS
25.0000 mg | ORAL_TABLET | Freq: Three times a day (TID) | ORAL | Status: DC | PRN
  Administered 2023-12-20 – 2023-12-21 (×3): 25 mg via ORAL
  Filled 2023-12-18 (×3): qty 1

## 2023-12-18 MED ORDER — MAGNESIUM HYDROXIDE 400 MG/5ML PO SUSP
30.0000 mL | Freq: Every day | ORAL | Status: DC | PRN
Start: 2023-12-18 — End: 2024-01-02

## 2023-12-18 MED ORDER — ACETAMINOPHEN 325 MG PO TABS
650.0000 mg | ORAL_TABLET | Freq: Four times a day (QID) | ORAL | Status: DC | PRN
  Administered 2023-12-20 (×2): 650 mg via ORAL
  Filled 2023-12-18 (×2): qty 2

## 2023-12-18 MED ORDER — HALOPERIDOL LACTATE 5 MG/ML IJ SOLN: 5.0000 mg | Freq: Three times a day (TID) | INTRAMUSCULAR | Status: DC | PRN

## 2023-12-18 MED ORDER — HALOPERIDOL 5 MG PO TABS: 5.0000 mg | ORAL_TABLET | Freq: Three times a day (TID) | ORAL | Status: DC | PRN

## 2023-12-18 MED ORDER — QUETIAPINE FUMARATE 200 MG PO TABS: 200.0000 mg | ORAL_TABLET | Freq: Every day | ORAL | Status: DC

## 2023-12-18 NOTE — ED Provider Notes (Signed)
 [facility] Provider Note    None    (approximate)   History   Psychiatric Evaluation (Pt reports having hallucinations, anxiety, and SI without a plan. No HI at this time. Pt reports not being on his medication for 1 month now.)   HPI  Jeremiah Newman is a 33 y.o. male with history of PTSD, bipolar, schizophrenia who comes in with concerns for hallucinations.  Patient reports hallucinations, anxiety and thoughts of hurting himself.  When asked him what his plan was he stated there is a bunch of different ways of doing it but denied a obvious plan.  He denies any obvious HI but just states that when he gets sleep deprived like this he is worried that he could snap.  He states that he came here today to hopefully get put into a psychiatric rehabilitation program.  Patient denies any other medical concerns.  Patient reports being off all of his medications.  Physical Exam   Triage Vital Signs: ED Triage Vitals [12/18/23 0911]  Encounter Vitals Group     BP (!) 133/100     Systolic BP Percentile      Diastolic BP Percentile      Pulse Rate 89     Resp 16     Temp 99.1 F (37.3 C)     Temp Source Oral     SpO2 98 %     Weight 185 lb (83.9 kg)     Height 5\' 8"  (1.727 m)     Head Circumference      Peak Flow      Pain Score 0     Pain Loc      Pain Education      Exclude from Growth Chart     Most recent vital signs: Vitals:   12/18/23 0911  BP: (!) 133/100  Pulse: 89  Resp: 16  Temp: 99.1 F (37.3 C)  SpO2: 98%     General: Awake, no distress.  CV:  Good peripheral perfusion.  Resp:  Normal effort.  Abd:  No distention.  Other:  Patient is pleasant, sitting calmly.   ED Results / Procedures / Treatments   Labs (all labs ordered are listed, but only abnormal results are displayed) Labs Reviewed  CBC WITH DIFFERENTIAL/PLATELET - Abnormal; Notable for the following components:      Result Value   WBC 11.7 (*)    Neutro Abs 9.4 (*)     All other components within normal limits  COMPREHENSIVE METABOLIC PANEL  ETHANOL  URINE DRUG SCREEN, QUALITATIVE ([facility] ONLY)     PROCEDURES:  Critical Care performed: No  Procedures   MEDICATIONS ORDERED IN ED: Medications - No data to display   IMPRESSION / MDM / ASSESSMENT AND PLAN / ED COURSE  I reviewed the triage vital signs and the nursing notes.   Patient's presentation is most consistent with acute presentation with potential threat to life or bodily function.   Pt is without any acute medical complaints. No exam findings to suggest medical cause of current presentation. Will order psychiatric screening labs and discuss further w/ psychiatric service.  At this time patient is willing to stay voluntarily.  D/d includes but is not limited to psychiatric disease, behavioral/personality disorder, inadequate socioeconomic support, medical.  Based on HPI, exam, unremarkable labs, no concern for acute medical problem at this time. No rigidity, clonus, hyperthermia, focal neurologic deficit, diaphoresis, tachycardia, meningismus, ataxia, gait abnormality or other finding to suggest this visit  represents a non-psychiatric problem. Screening labs reviewed.    Given this, pt medically cleared, to be dispositioned per Psych.  At this time patient is willing to stay voluntarily  The patient has been placed in psychiatric observation due to the need to provide a safe environment for the patient while obtaining psychiatric consultation and evaluation, as well as ongoing medical and medication management to treat the patient's condition.  The patient has not been placed under full IVC at this time.       FINAL CLINICAL IMPRESSION(S) / ED DIAGNOSES   Final diagnoses:  Hallucinations     Rx / DC Orders   ED Discharge Orders     None        Note:  This document was prepared using Dragon voice recognition software and may include unintentional dictation errors.    Lubertha Rush, MD 12/18/23 [phone]

## 2023-12-18 NOTE — ED Notes (Signed)
 Pt. Transferred to BHU , room# 3 from Triage .Patient was screened by security before entering the unit. Received Report and Recommendations from [facility], Charity fundraiser . Pt. Oriented to unit including Q15 minute rounding as well as locked bathroom protocol, meal / snack schedule, and  the security cameras in place for their protection. Patient is A/O x 4, warm / dry.  Showing no acute signs of distress. Staff to monitor as ordered

## 2023-12-18 NOTE — Plan of Care (Signed)
  Problem: Education: Goal: Emotional status will improve Outcome: Not Progressing Goal: Mental status will improve Outcome: Not Progressing   Problem: Safety: Goal: Periods of time without injury will increase Outcome: Progressing

## 2023-12-18 NOTE — ED Notes (Signed)
 VOL PT went downstairs to [facility]

## 2023-12-18 NOTE — Progress Notes (Signed)
   12/18/23 1632  Psych Admission Type (Psych Patients Only)  Admission Status Voluntary  Psychosocial Assessment  Patient Complaints None  Eye Contact Brief  Facial Expression Anxious  Affect Anxious;Depressed  Speech Logical/coherent  Interaction Assertive  Motor Activity Pacing  Appearance/Hygiene In scrubs  Mood Anxious  Thought Process  Coherency Disorganized  Content Preoccupation  Delusions None reported or observed  Perception Hallucinations  Hallucination Visual  Judgment Impaired  Confusion None  Danger to Self  Current suicidal ideation? Passive  Self-Injurious Behavior No self-injurious ideation or behavior indicators observed or expressed   Agreement Not to Harm Self Yes  Description of Agreement verbal  Danger to Others  Danger to Others None reported or observed   Patient is A/O/x3 with situation. The patient reports feeling increasingly depressed, anxious, and paranoid, particularly in the last few weeks. He mentions that his sleep has been significantly impaired, with inability to sleep for over three days at a time. He denies any active suicidal ideation (SI) or homicidal ideation (HI), but does endorse passive SI. The patient denies auditory hallucinations (AH) but reports visual hallucinations (VH). He does not appear to be responding to internal stimuli at present. The patient has been off medications since his last discharge last year. He has a history of PTSD, major depressive disorder (MDD), adjustment disorder, insomnia, and substance use disorder. He was recently released from incarceration after a 6-year sentence and seeks help for substance abuse rehabilitation.Denies active suicidal ideation but endorses passive suicidal ideation. Denies homicidal ideation. Paranoid thoughts present, but no delusions noted. Reports visual hallucinations (VH). No auditory hallucinations. Visual hallucinations (not responding to internal stimuli).
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
## 2023-12-18 NOTE — Group Note (Unsigned)
 Date:  12/18/2023 Time:  9:42 PM  Group Topic/Focus:  Wellness Toolbox:   The focus of this group is to discuss various aspects of wellness, balancing those aspects and exploring ways to increase the ability to experience wellness.  Patients will create a wellness toolbox for use upon discharge.     Participation Level:  {[facility] PARTICIPATION BJYNW:29562}  Participation Quality:  {[facility] PARTICIPATION QUALITY:22265}  Affect:  {[facility]HH AFFECT:22266}  Cognitive:  {[facility] COGNITIVE:22267}  Insight: {BHH Insight2:20797}  Engagement in Group:  {BHH ENGAGEMENT IN ZHYQM:57846}  Modes of Intervention:  {BHH MODES OF INTERVENTION:22269}  Additional Comments:  ***  Jeremiah Newman 12/18/2023, 9:42 PM

## 2023-12-18 NOTE — ED Provider Triage Note (Signed)
Emergency Medicine Provider Triage Evaluation Note  Jeremiah Newman , a 33 y.o. male  was evaluated in triage.  Pt complains of depression.  Has taken lexapro in past but not since being in prison.    Review of Systems  Positive: Depression, fatigue Negative: Denies SI, HI at present  Physical Exam  There were no vitals taken for this visit. Gen:   Awake, no distress   Resp:  Normal effort  MSK:   Moves extremities without difficulty  Other:    Medical Decision Making  Medically screening exam initiated at 9:05 AM.  Appropriate orders placed.  Silver Huguenin was informed that the remainder of the evaluation will be completed by another provider, this initial triage assessment does not replace that evaluation, and the importance of remaining in the ED until their evaluation is complete.     Tommi Rumps, PA-C 12/18/23 [phone]

## 2023-12-18 NOTE — ED Triage Notes (Signed)
 Pt reports having hallucinations, anxiety, and SI without a plan. No HI at this time. Pt reports not being on his medication for 1 month now.

## 2023-12-18 NOTE — Consult Note (Addendum)
 [facility] Psychiatric Consult Initial  Patient Name: .HUSANI HELFER  MRN: [id number]  DOB: June 16, 1991  Consult Order details:  Orders (From admission, onward)     Start     Ordered   12/18/23 0931  CONSULT TO CALL ACT TEAM       Ordering Provider: Lubertha Rush, MD  Provider:  (Not yet assigned)  Question:  Reason for Consult?  Answer:  Psych consult   12/18/23 0931   12/18/23 0931  IP CONSULT TO PSYCHIATRY       Ordering Provider: Lubertha Rush, MD  Provider:  (Not yet assigned)  Question Answer Comment  Place call to: [phone]   Reason for Consult Admit      12/18/23 0931             Mode of Visit: In person, I spent 45 minutes on this consult, and 15 min with another NP, Fredrik Jensen NP.     Psychiatry Consult Evaluation  Service Date: December 18, 2023 LOS:  LOS: 0 days  Chief Complaint " I just cannot sleep and I am super depressed"  Primary Psychiatric Diagnoses  Paranoia 3.  Adjustment disorder, with mixed anxiety and depressed moods  Assessment  CORDIE MAROTZ is a 33 y.o. male admitted: Presented to the EDfor 12/18/2023  9:36 AM for depression, anxiety, paranoia. He carries the psychiatric diagnoses of Paranoia and adjustment disorder and has a past medical history of substance use, PTSD, insomnia, and MDD.   His current presentation of depression, anxiety, and paranoia is most consistent with reports of being unable to sleep at night for longer than 3 days, irritability, and collateral report. He meets criteria for schizoaffective disorder based on decreased need for sleep, pressured talk, paranoia.Rule out schizoaffective disorder or subtance induced psychosis.   Current outpatient psychotropic medications include Seroquel , trazodone , Protonix , Remeron , gabapentin  and historically he has had a known may have I will try it next time I got a response to these medications. He was compliant compliant with medications prior to admission as evidenced by patient  reporting collateral report. On initial examination, patient is tearful sitting up in bed, and participating in interview. Please see plan below for detailed recommendations.   Diagnoses:  Active Hospital problems: Active Problems:   Adjustment disorder with mixed anxiety and depressed mood   Paranoia (psychosis) (HCC)    Plan   ## Psychiatric Medication Recommendations:  - Start Lithium  300mg  PO BID, with lab draw on 12/23/23, for mood stability - Start Seroquel  200mg  PO at bedtime - Start Hydroxyzine  25mg  PO TID as needed for anxiety   ## Medical Decision Making Capacity: Not specifically addressed in this encounter  ## Further Work-up:  -- Lithium  level on 12/23/2023, UDS  recent EKG on 12/18/23 had QtC of 433 -- Pertinent labwork reviewed earlier this admission includes: CBC, CMP, glucose, LFTs   ## Disposition:-- There are no psychiatric contraindications to discharge at this time  ## Behavioral / Environmental: -Utilize compassion and acknowledge the patient's experiences while setting clear and realistic expectations for care.    ## Safety and Observation Level:  - Based on my clinical evaluation, I estimate the patient to be at low risk of self harm in the current setting. - At this time, we recommend  routine. This decision is based on my review of the chart including patient's history and current presentation, interview of the patient, mental status examination, and consideration of suicide risk including evaluating suicidal ideation, plan, intent, suicidal or self-harm  behaviors, risk factors, and protective factors. This judgment is based on our ability to directly address suicide risk, implement suicide prevention strategies, and develop a safety plan while the patient is in the clinical setting. Please contact our team if there is a concern that risk level has changed.  CSSR Risk Category:C-SSRS RISK CATEGORY: High Risk  Suicide Risk Assessment: Patient has following  modifiable risk factors for suicide: under treated depression , which we are addressing by recommending for inpatient admission. Patient has following non-modifiable or demographic risk factors for suicide: psychiatric hospitalization Patient has the following protective factors against suicide: none identified.  Thank you for this consult request. Recommendations have been communicated to the primary team.  We will recommend inpatient admission at this time.   Arlana Labor, NP       History of Present Illness  Relevant Aspects of Hospital ED Course:  Admitted on 12/18/2023 for depression, anxiety, paranoia. They are currently sitting up in bed, tearful and participating in interview.   Patient Report:  33 year old male presenting to the ER on this day for complaint of depression and not being able to sleep for 2 days.  He endorses paranoia as well stating what is keeping him up at night is feeling like people are after him and surrounding the house at night.  He reports that his family is fed up with him because he keeps him up at night, it appears that he is a minimizing stating this is only been lasting for 2 days.  Upon collateral information from the father at [phone] father reports that the patient has been consistently unable to sleep for the last 2 weeks.  States the mostly with sleep is 1 to 2 hours every night.  He was recently admitted at this unit at [facility] on the month of November in which she was discharged with antidepressant Lexapro .  He is now stating that he is having manic like symptoms, being unable to sleep and increased symptoms of paranoia and psychosis.  He states that he does want to harm himself and kill himself reporting that he will take pills to overdose, he does not have any homicidal ideation, but endorsing auditory and visual hallucinations stating paranoia.  He hears whispers but unintelligible, and he sees shadowy figures at all times.  He has past  medical history of PTSD and was recently incarcerated, still trying to adjust being outside of prison.  He as well as his father endorses that this is a very difficult.  And trying to acclimate to life outside of prison, with the patient endorsing that he feels safer in prison.  He is currently unemployed lives with mom and dad.  He does endorse that he has taken meth a week ago, but does not appear to be substance-induced as he has not been sleeping for 2 weeks.  UDS will be ordered to check.  For this reason it is recommended for inpatient admission, for medication adjustment and monitoring, and safety.  Psych ROS:  Depression: Endorses Anxiety:  Endorses Mania (lifetime and current): Positive, stating has not slept for more than 1-2 hours a night for the last 2 weeks.  Psychosis: (lifetime and current): Audio [place] hallucinations.  Collateral information:  Contacted Father at [phone] on 12/18/2023 at 12 PM Father reports that the patient has been having issues for the last 2 weeks keeping them up all night due to his paranoia.  He reports that the patient will continually states that he is being surrounded all  around the house stating that they are after him.  This paranoia has been consistent in its content since being released from jail always being hyperaware about his surroundings thinking that there is gang members after him.  He endorses that the patient has not been sleeping well for the last 2 weeks only getting 1 to 2 hours of sleep each night.  He states that patient has been compliant with medications.  And does endorse that it appears that he is not safe to himself and is afraid that he will harm himself.  He has not verbalized wanting to kill kill himself but appears to want to do so in his behavior.  Review of Systems  Constitutional: Negative.   HENT: Negative.    Eyes: Negative.   Respiratory: Negative.    Cardiovascular: Negative.   Gastrointestinal: Negative.    Genitourinary: Negative.   Musculoskeletal: Negative.   Skin: Negative.   Neurological: Negative.   Endo/Heme/Allergies: Negative.   Psychiatric/Behavioral:  Positive for hallucinations and suicidal ideas. The patient is nervous/anxious and has insomnia.      Psychiatric and Social History  Psychiatric History:  Information collected from patient  Prev Dx/Sx: Schizophrenia, bipolar, MDD, PTSD Current Psych Provider: Denies Home Meds (current): Seroquel  Previous Med Trials: Unable to recall Therapy: Denies  Prior Psych Hospitalization: [facility] on November 2024 Prior Self Harm: Denies Prior Violence: Denies  Family Psych History: Denies Family Hx suicide: Denies  Social History:  Developmental Hx: Denies Educational Hx: GED Occupational Hx: Unemployed Legal Hx: Previously incarcerated released 3 months ago Living Situation: Lives with mother and father Spiritual Hx: Denies Access to weapons/lethal means: Reports father has firearms but they are locked up.  Please safety plan before discharge.  Substance History Alcohol: Denies Tobacco: Denies Illicit drugs: Meth and fentanyl    Exam Findings   Vital Signs:  Temp:  [99.1 F (37.3 C)] 99.1 F (37.3 C) (01/15 0911) Pulse Rate:  [89] 89 (01/15 0911) Resp:  [16] 16 (01/15 0911) BP: (133)/(100) 133/100 (01/15 0911) SpO2:  [98 %] 98 % (01/15 0911) Weight:  [83.9 kg] 83.9 kg (01/15 0911) Blood pressure (!) 133/100, pulse 89, temperature 99.1 F (37.3 C), temperature source Oral, resp. rate 16, height 5\' 8"  (1.727 m), weight 83.9 kg, SpO2 98%. Body mass index is 28.13 kg/m.  Physical Exam Vitals and nursing note reviewed.  HENT:     Head: Normocephalic.     Mouth/Throat:     Mouth: Mucous membranes are moist.  Cardiovascular:     Rate and Rhythm: Normal rate.  Pulmonary:     Effort: Pulmonary effort is normal.  Musculoskeletal:     Cervical back: Normal range of motion.  Skin:    General: Skin is  warm and dry.  Psychiatric:        Attention and Perception: He perceives auditory and visual hallucinations.        Mood and Affect: Mood is anxious.        Speech: Speech is rapid and pressured.        Behavior: Behavior is agitated.        Judgment: Judgment is impulsive.    Mental Status Exam: General Appearance: Disheveled  Orientation:  Full (Time, Place, and Person)  Memory:  Immediate;   Good Recent;   Good Remote;   Good  Concentration:  Concentration: Good and Attention Span: Good  Recall:  Good  Attention  Good  Eye Contact:  Good  Speech:  Clear and Coherent  Language:  Good  Volume:  Normal  Mood: Depressed  Affect:  Flat  Thought Process:  Coherent  Thought Content:  Logical  Suicidal Thoughts:  Yes.  with intent/plan  Homicidal Thoughts:  No  Judgement:  Good  Insight:  Good  Psychomotor Activity:  Normal  Akathisia:  No  Fund of Knowledge:  Good      Assets:  Housing Social Support  Cognition:  WNL  ADL's:  Intact  AIMS (if indicated):        Other History   These have been pulled in through the EMR, reviewed, and updated if appropriate.  Family History:  The patient's family history is not on file.  Medical History: History reviewed. No pertinent past medical history.  Surgical History: History reviewed. No pertinent surgical history.   Medications:   Current Facility-Administered Medications:  .  hydrOXYzine  (ATARAX ) tablet 25 mg, 25 mg, Oral, TID PRN, Fuquan Wilson, Murry Art, NP .  lithium  carbonate capsule 300 mg, 300 mg, Oral, BID WC, Matheo Rathbone, Murry Art, NP .  QUEtiapine  (SEROQUEL ) tablet 200 mg, 200 mg, Oral, QHS, Cailin Gebel, Murry Art, NP  Current Outpatient Medications:  .  escitalopram  (LEXAPRO ) 10 MG tablet, Take 3 tablets (30 mg total) by mouth daily., Disp: 90 tablet, Rfl: 0 .  escitalopram  (LEXAPRO ) 20 MG tablet, Take 1 tablet (20 mg total) by mouth daily., Disp: 30 tablet, Rfl: 1 .  escitalopram  (LEXAPRO ) 20 MG tablet,  Take 1 tablet (20 mg total) by mouth daily., Disp: 30 tablet, Rfl: 0 .  gabapentin  (NEURONTIN ) 300 MG capsule, Take 1 capsule (300 mg total) by mouth 3 (three) times daily., Disp: 90 capsule, Rfl: 0 .  mirtazapine  (REMERON ) 7.5 MG tablet, Take 1 tablet (7.5 mg total) by mouth at bedtime., Disp: 30 tablet, Rfl: 0 .  pantoprazole  (PROTONIX ) 40 MG tablet, Take 1 tablet (40 mg total) by mouth daily., Disp: 30 tablet, Rfl: 0 .  QUEtiapine  (SEROQUEL ) 200 MG tablet, Take 1 tablet (200 mg total) by mouth at bedtime. (Patient not taking: Reported on 12/18/2023), Disp: 30 tablet, Rfl: 0 .  traZODone  (DESYREL ) 100 MG tablet, Take 1 tablet (100 mg total) by mouth at bedtime as needed for sleep., Disp: 30 tablet, Rfl: 0  Allergies: No Known Allergies  Arlana Labor, NP

## 2023-12-18 NOTE — Group Note (Signed)
 Date:  12/18/2023 Time:  9:54 PM  Group Topic/Focus:  Wellness Toolbox:   The focus of this group is to discuss various aspects of wellness, balancing those aspects and exploring ways to increase the ability to experience wellness.  Patients will create a wellness toolbox for use upon discharge.    Participation Level:  Did Not Attend  Participation Quality:   none  Affect:   none  Cognitive:   none  Insight: None  Engagement in Group:   none  Modes of Intervention:   none  Additional Comments:  none   Jeremiah Newman 12/18/2023, 9:54 PM

## 2023-12-18 NOTE — ED Notes (Signed)
 Report given to [facility] RN Evone Hoh. Pt is agreeable to inpatient Tx

## 2023-12-18 NOTE — Progress Notes (Signed)
 Patient is pleasant and cooperative. Denies SI, HI, AVH. Endorses depression and anxiety. Isolative to self and room most of shift. Did come out for meds. No interaction with peers. Minimal with staff. Encouragement and support provided. Safety checks maintained. Medications given as prescribed. Pt receptive and remains safe on unit with q 15 min checks.

## 2023-12-18 NOTE — BH Assessment (Signed)
 Patient is to be admitted to [facility] on today 12/18/23 by Archibald Beard, NP. Attending Physician will be Dr. Azalee Bolds Patient has been assigned to room 313, by [facility] Charge Nurse Dosunmu  ER staff is aware of the admission: -Shelvy Dickens, ER Secretary -Dr. Drenda Gentle, ER MD -Alston Jerry, Patient's Nurse

## 2023-12-19 DIAGNOSIS — F250 Schizoaffective disorder, bipolar type: Secondary | ICD-10-CM | POA: Diagnosis not present

## 2023-12-19 MED ORDER — GABAPENTIN 300 MG PO CAPS
300.0000 mg | ORAL_CAPSULE | Freq: Three times a day (TID) | ORAL | Status: DC
  Administered 2023-12-19 – 2024-01-01 (×40): 300 mg via ORAL
  Filled 2023-12-19 (×40): qty 1

## 2023-12-19 NOTE — Group Note (Signed)
Recreation Therapy Group Note   Group Topic:Coping Skills  Group Date: 12/19/2023 Start Time: 1000 End Time: 1045 Facilitators: Rosina Lowenstein, LRT, CTRS Location:  Craft Room  Group Description: Mind Map.  Patient was provided a blank template of a diagram with 32 blank boxes in a tiered system, branching from the center (similar to a bubble chart). LRT directed patients to label the middle of the diagram "Coping Skills". LRT and patients then came up with 8 different coping skills as examples. Pt were directed to record their coping skills in the 2nd tier boxes closest to the center.  Patients would then share their coping skills with the group as LRT wrote them out. LRT gave a handout of 99 different coping skills at the end of group.   Goal Area(s) Addressed: Patients will be able to define "coping skills". Patient will identify new coping skills.  Patient will increase communication.   Affect/Mood: N/A   Participation Level: Did not attend    Clinical Observations/Individualized Feedback: Patient did not attend group.   Plan: Continue to engage patient in RT group sessions 2-3x/week.   Rosina Lowenstein, LRT, CTRS 12/19/2023 12:06 PM

## 2023-12-19 NOTE — Progress Notes (Signed)
Pt presented with flat and sad affect and mood, reports having A/V/H, spent most of the day in bed c/o LIC03 "makes me feel bad".   Compliant otherwise.   12/19/23 0900  Psych Admission Type (Psych Patients Only)  Admission Status Voluntary  Psychosocial Assessment  Patient Complaints None  Eye Contact Brief  Facial Expression Flat;Sullen  Affect Sad  Speech Logical/coherent  Interaction Assertive  Motor Activity Slow  Appearance/Hygiene In scrubs  Behavior Characteristics Appropriate to situation  Mood Sullen  Thought Process  Coherency WDL  Content Preoccupation  Delusions None reported or observed  Perception UTA  Hallucination Auditory;Visual  Judgment Impaired  Confusion None  Danger to Self  Current suicidal ideation? Denies  Self-Injurious Behavior No self-injurious ideation or behavior indicators observed or expressed   Agreement Not to Harm Self Yes  Description of Agreement verbal  Danger to Others  Danger to Others None reported or observed

## 2023-12-19 NOTE — [facility] Suicide Risk Assessment (Signed)
[facility] INPATIENT:  Family/Significant Other Suicide Prevention Education  Suicide Prevention Education:  Patient Refusal for Family/Significant Other Suicide Prevention Education: The patient Jeremiah Newman has refused to provide written consent for family/significant other to be provided Family/Significant Other Suicide Prevention Education during admission and/or prior to discharge.  Physician notified.  SPE completed with pt, as pt refused to consent to family contact. SPI pamphlet provided to pt and pt was encouraged to share information with support network, ask questions, and talk about any concerns relating to SPE. Pt denies access to guns/firearms and verbalized understanding of information provided. Mobile Crisis information also provided to pt.  Glenis Smoker 12/19/2023, 11:19 AM

## 2023-12-19 NOTE — H&P (Signed)
Psychiatric Admission Assessment Adult  Patient Identification: Jeremiah Newman MRN:  [id number] Date of Evaluation:  12/19/2023 Chief Complaint:  Paranoia (psychosis) (HCC) [F22] Principal Diagnosis: Schizoaffective disorder, bipolar type (HCC) Diagnosis:  Principal Problem:   Schizoaffective disorder, bipolar type (HCC) Active Problems:   Suicidal ideation   PTSD (post-traumatic stress disorder)   Insomnia   Paranoia (psychosis) (HCC)  History of Present Illness: Jeremiah Newman is a 33 year old Caucasian male with a history of schizoaffective disorder bipolar type and PTSD who presented voluntarily with reports of suicidal ideation, command auditory hallucinations, intrusive thoughts, and paranoia. States he has not slept in days. Reports worsening symptoms over the past two weeks in the context of medication non-adherence and recent substance use. UDS positive for amphetamine, cocaine, THC. Patient was released from prison in November 2024 and has had adjustment challenges. He has an extensive trauma history beginning in childhood. Currently living with parents. Seeking a structured treatment program.  Associated Signs/Symptoms: Depression Symptoms:  depressed mood, insomnia, psychomotor agitation, feelings of worthlessness/guilt, difficulty concentrating, hopelessness, recurrent thoughts of death, suicidal thoughts without plan, anxiety, panic attacks, disturbed sleep, (Hypo) Manic Symptoms:  Delusions, Distractibility, Hallucinations, Irritable Mood, Anxiety Symptoms:  Excessive Worry, Panic Symptoms, Social Anxiety, Psychotic Symptoms:  Delusions, Hallucinations: Auditory Paranoia, PTSD Symptoms: Had a traumatic exposure:  Yes, multiple Re-experiencing:  Flashbacks Intrusive Thoughts Nightmares Hypervigilance:  Yes Hyperarousal:  Difficulty Concentrating Emotional Numbness/Detachment Increased Startle Response Irritability/Anger Sleep Avoidance:   Foreshortened Future Total Time spent with patient: 1 hour  Past Psychiatric History: Multiple previous inpatient hospitalizations, most recent [facility] November 2024. Not established with outpatient. Multiple previous suicide attempts, most recent 1 year ago via hanging. Past trials of Remeron, Lexapro.  Is the patient at risk to self? Yes.    Has the patient been a risk to self in the past 6 months? Yes.    Has the patient been a risk to self within the distant past? Yes.    Is the patient a risk to others? No.  Has the patient been a risk to others in the past 6 months? No.  Has the patient been a risk to others within the distant past? No.   [place] Scale:  Flowsheet Row Admission (Current) from 12/18/2023 in [facility] INPATIENT BEHAVIORAL MEDICINE Most recent reading at 12/18/2023  4:32 PM ED from 12/18/2023 in [facility] Emergency Department at [facility] Most recent reading at 12/18/2023 12:17 PM Admission (Discharged) from 10/09/2023 in [facility] INPATIENT BEHAVIORAL MEDICINE Most recent reading at 10/09/2023  4:00 AM  C-SSRS RISK CATEGORY High Risk High Risk Moderate Risk        Alcohol Screening: 1. How often do you have a drink containing alcohol?: 4 or more times a week 2. How many drinks containing alcohol do you have on a typical day when you are drinking?: 3 or 4 3. How often do you have six or more drinks on one occasion?: Less than monthly AUDIT-C Score: 6 4. How often during the last year have you found that you were not able to stop drinking once you had started?: Never 5. How often during the last year have you failed to do what was normally expected from you because of drinking?: Never 6. How often during the last year have you needed a first drink in the morning to get yourself going after a heavy drinking session?: Never 7. How often during the last year have you had a feeling of guilt of remorse after drinking?: Never 8. How often during  the last year have you been unable  to remember what happened the night before because you had been drinking?: Never 9. Have you or someone else been injured as a result of your drinking?: No 10. Has a relative or friend or a doctor or another health worker been concerned about your drinking or suggested you cut down?: No Alcohol Use Disorder Identification Test Final Score (AUDIT): 6 Alcohol Brief Interventions/Follow-up: Alcohol education/Brief advice Substance Abuse History in the last 12 months:  Yes.   Consequences of Substance Abuse: Legal Consequences:  has spent time in prison Previous Psychotropic Medications: Yes  Psychological Evaluations: No  Past Medical History: History reviewed. No pertinent past medical history. History reviewed. No pertinent surgical history. Family History: History reviewed. No pertinent family history. Family Psychiatric  History: Significant for substance use; no family history of suicide Tobacco Screening:  Social History   Tobacco Use  Smoking Status Some Days   Current packs/day: 1.00   Average packs/day: 1 pack/day for 9.0 years (9.0 ttl pk-yrs)   Types: Cigarettes   Start date: 2016  Smokeless Tobacco Never    BH Tobacco Counseling     Are you interested in Tobacco Cessation Medications?  No, patient refused Counseled patient on smoking cessation:  N/A, patient does not use tobacco products Reason Tobacco Screening Not Completed: No value filed.       Social History:  Social History   Substance and Sexual Activity  Alcohol Use Yes     Social History   Substance and Sexual Activity  Drug Use No    Additional Social History: Marital status: Single Are you sexually active?: No What is your sexual orientation?: Heterosexual Has your sexual activity been affected by drugs, alcohol, medication, or emotional stress?: N/A Does patient have children?: Yes How many children?: 85 (45 year old son) How is patient's relationship with their children?: "Haven't seen him in a  long time." Pt shared that child's mother is dead and he does not know where him and the guardians are.                         Allergies:  No Known Allergies Lab Results:  Results for orders placed or performed during the hospital encounter of 12/18/23 (from the past 48 hours)  Comprehensive metabolic panel     Status: Abnormal   Collection Time: 12/18/23  9:10 AM  Result Value Ref Range   Sodium 134 (L) 135 - 145 mmol/L   Potassium 3.8 3.5 - 5.1 mmol/L   Chloride 99 98 - 111 mmol/L   CO2 21 (L) 22 - 32 mmol/L   Glucose, Bld 95 70 - 99 mg/dL    Comment: Glucose reference range applies only to samples taken after fasting for at least 8 hours.   BUN 13 6 - 20 mg/dL   Creatinine, Ser 1.61 0.61 - 1.24 mg/dL   Calcium 9.5 8.9 - 09.6 mg/dL   Total Protein 8.3 (H) 6.5 - 8.1 g/dL   Albumin 4.9 3.5 - 5.0 g/dL   AST 33 15 - 41 U/L   ALT 20 0 - 44 U/L   Alkaline Phosphatase 79 38 - 126 U/L   Total Bilirubin 1.5 (H) 0.0 - 1.2 mg/dL   GFR, Estimated >04 >54 mL/min    Comment: (NOTE) Calculated using the CKD-EPI Creatinine Equation (2021)    Anion gap 14 5 - 15    Comment: Performed at [facility], [address] [place],  [place] [address]  Ethanol     Status: None   Collection Time: 12/18/23  9:10 AM  Result Value Ref Range   Alcohol, Ethyl (B) <10 <10 mg/dL    Comment: (NOTE) Lowest detectable limit for serum alcohol is 10 mg/dL.  For medical purposes only. Performed at [facility], [address] [place], [place] [address]   CBC with Diff     Status: Abnormal   Collection Time: 12/18/23  9:10 AM  Result Value Ref Range   WBC 11.7 (H) 4.0 - 10.5 K/uL   RBC 5.39 4.22 - 5.81 MIL/uL   Hemoglobin 16.1 13.0 - 17.0 g/dL   HCT 95.2 84.1 - 32.4 %   MCV 84.2 80.0 - 100.0 fL   MCH 29.9 26.0 - 34.0 pg   MCHC 35.5 30.0 - 36.0 g/dL   RDW 40.1 02.7 - 25.3 %   Platelets 287 150 - 400 K/uL   nRBC 0.0 0.0 - 0.2 %   Neutrophils Relative % 81 %   Neutro Abs  9.4 (H) 1.7 - 7.7 K/uL   Lymphocytes Relative 11 %   Lymphs Abs 1.3 0.7 - 4.0 K/uL   Monocytes Relative 8 %   Monocytes Absolute 0.9 0.1 - 1.0 K/uL   Eosinophils Relative 0 %   Eosinophils Absolute 0.0 0.0 - 0.5 K/uL   Basophils Relative 0 %   Basophils Absolute 0.0 0.0 - 0.1 K/uL   Immature Granulocytes 0 %   Abs Immature Granulocytes 0.03 0.00 - 0.07 K/uL    Comment: Performed at [facility], [address] [place], [place] [address]  Salicylate level     Status: Abnormal   Collection Time: 12/18/23  9:10 AM  Result Value Ref Range   Salicylate Lvl <7.0 (L) 7.0 - 30.0 mg/dL    Comment: Performed at [facility], [address] [place], [place] [address]  Acetaminophen level     Status: Abnormal   Collection Time: 12/18/23  9:10 AM  Result Value Ref Range   Acetaminophen (Tylenol), Serum <10 (L) 10 - 30 ug/mL    Comment: (NOTE) Therapeutic concentrations vary significantly. A range of 10-30 ug/mL  may be an effective concentration for many patients. However, some  are best treated at concentrations outside of this range. Acetaminophen concentrations >150 ug/mL at 4 hours after ingestion  and >50 ug/mL at 12 hours after ingestion are often associated with  toxic reactions.  Performed at [facility], [address] [place], [place] [address]   Urine Drug Screen, Qualitative     Status: Abnormal   Collection Time: 12/18/23  1:00 PM  Result Value Ref Range   Tricyclic, Ur Screen NONE DETECTED NONE DETECTED   Amphetamines, Ur Screen POSITIVE (A) NONE DETECTED   MDMA (Ecstasy)Ur Screen NONE DETECTED NONE DETECTED   Cocaine Metabolite,Ur [place] POSITIVE (A) NONE DETECTED   Opiate, Ur Screen NONE DETECTED NONE DETECTED   Phencyclidine (PCP) Ur S NONE DETECTED NONE DETECTED   Cannabinoid 50 Ng, Ur [place] POSITIVE (A) NONE DETECTED   Barbiturates, Ur Screen NONE DETECTED NONE DETECTED   Benzodiazepine, Ur Scrn NONE DETECTED NONE DETECTED   Methadone Scn,  Ur NONE DETECTED NONE DETECTED    Comment: (NOTE) Tricyclics + metabolites, urine    Cutoff 1000 ng/mL Amphetamines + metabolites, urine  Cutoff 1000 ng/mL MDMA (Ecstasy), urine              Cutoff 500 ng/mL Cocaine Metabolite, urine  Cutoff 300 ng/mL Opiate + metabolites, urine        Cutoff 300 ng/mL Phencyclidine (PCP), urine         Cutoff 25 ng/mL Cannabinoid, urine                 Cutoff 50 ng/mL Barbiturates + metabolites, urine  Cutoff 200 ng/mL Benzodiazepine, urine              Cutoff 200 ng/mL Methadone, urine                   Cutoff 300 ng/mL  The urine drug screen provides only a preliminary, unconfirmed analytical test result and should not be used for non-medical purposes. Clinical consideration and professional judgment should be applied to any positive drug screen result due to possible interfering substances. A more specific alternate chemical method must be used in order to obtain a confirmed analytical result. Gas chromatography / mass spectrometry (GC/MS) is the preferred confirm atory method. Performed at [facility], [address] [place], [place] [address]     Blood Alcohol level:  Lab Results  Component Value Date   [facility] <10 12/18/2023   ETH <10 10/08/2023    Metabolic Disorder Labs:  Lab Results  Component Value Date   HGBA1C 4.9 10/10/2023   MPG 93.93 10/10/2023   No results found for: "PROLACTIN" Lab Results  Component Value Date   CHOL 214 (H) 10/10/2023   TRIG 135 10/10/2023   HDL 41 10/10/2023   CHOLHDL 5.2 10/10/2023   VLDL 27 10/10/2023   LDLCALC 146 (H) 10/10/2023    Current Medications: Current Facility-Administered Medications  Medication Dose Route Frequency Provider Last Rate Last Admin   acetaminophen (TYLENOL) tablet 650 mg  650 mg Oral Q6H PRN [name], [name], MD       alum & mag hydroxide-simeth (MAALOX/MYLANTA) 200-200-20 MG/5ML suspension 30 mL  30 mL Oral Q4H PRN [name], [name], MD        haloperidol (HALDOL) tablet 5 mg  5 mg Oral TID PRN [name], MD       And   diphenhydrAMINE (BENADRYL) capsule 50 mg  50 mg Oral TID PRN [name], [name], MD       haloperidol lactate (HALDOL) injection 5 mg  5 mg Intramuscular TID PRN [name], [name], MD       And   diphenhydrAMINE (BENADRYL) injection 50 mg  50 mg Intramuscular TID PRN [name], [name], MD       And   LORazepam (ATIVAN) injection 2 mg  2 mg Intramuscular TID PRN [name], [name], MD       haloperidol lactate (HALDOL) injection 10 mg  10 mg Intramuscular TID PRN [name], [name], MD       And   diphenhydrAMINE (BENADRYL) injection 50 mg  50 mg Intramuscular TID PRN [name], [name], MD       And   LORazepam (ATIVAN) injection 2 mg  2 mg Intramuscular TID PRN [name], [name], MD       gabapentin (NEURONTIN) capsule 300 mg  300 mg Oral TID [name], NP       hydrOXYzine (ATARAX) tablet 25 mg  25 mg Oral TID PRN [name], MD       lithium carbonate capsule 300 mg  300 mg Oral BID WC [name], [name], MD   300 mg at 12/19/23 0818   magnesium hydroxide (MILK OF MAGNESIA) suspension 30 mL  30 mL Oral Daily PRN [name], MD  QUEtiapine (SEROQUEL) tablet 200 mg  200 mg Oral QHS [name], Nathan, MD   200 mg at 12/18/23 2141   PTA Medications: Medications Prior to Admission  Medication Sig Dispense Refill Last Dose/Taking   escitalopram (LEXAPRO) 10 MG tablet Take 3 tablets (30 mg total) by mouth daily. 90 tablet 0    escitalopram (LEXAPRO) 20 MG tablet Take 1 tablet (20 mg total) by mouth daily. 30 tablet 1    escitalopram (LEXAPRO) 20 MG tablet Take 1 tablet (20 mg total) by mouth daily. 30 tablet 0    gabapentin (NEURONTIN) 300 MG capsule Take 1 capsule (300 mg total) by mouth 3 (three) times daily. 90 capsule 0    mirtazapine (REMERON) 7.5 MG tablet Take 1 tablet (7.5 mg total) by mouth at bedtime. 30 tablet 0    pantoprazole (PROTONIX) 40 MG tablet Take 1  tablet (40 mg total) by mouth daily. 30 tablet 0    QUEtiapine (SEROQUEL) 200 MG tablet Take 1 tablet (200 mg total) by mouth at bedtime. (Patient not taking: Reported on 12/18/2023) 30 tablet 0    traZODone (DESYREL) 100 MG tablet Take 1 tablet (100 mg total) by mouth at bedtime as needed for sleep. 30 tablet 0     Musculoskeletal: Strength & Muscle Tone: within normal limits Gait & Station: normal Patient leans: N/A  Psychiatric Specialty Exam:  Presentation  General Appearance:  Disheveled  Eye Contact: Fair  Speech: Clear and Coherent  Speech Volume: Normal  Handedness: Right   Mood and Affect  Mood: Anxious; Depressed  Affect: Blunt   Thought Process  Thought Processes: Coherent  Duration of Psychotic Symptoms: years Past Diagnosis of Schizophrenia or Psychoactive disorder: Yes  Descriptions of Associations:Intact  Orientation:Full (Time, Place and Person)  Thought Content:Paranoid Ideation  Hallucinations:Hallucinations: Auditory; Visual Description of Auditory Hallucinations: multiple voices Description of Visual Hallucinations: shadows  Ideas of Reference:Paranoia  Suicidal Thoughts:Suicidal Thoughts: Yes, Passive SI Passive Intent and/or Plan: Without Intent; Without Plan  Homicidal Thoughts:Homicidal Thoughts: No   Sensorium  Memory: Immediate Good; Recent Good; Remote Good  Judgment: Fair  Insight: Fair   Chartered certified accountant: Fair  Attention Span: Fair  Recall: Fair  Fund of Knowledge: Fair  Language: Fair   Psychomotor Activity  Psychomotor Activity: Psychomotor Activity: Restlessness; Increased   Assets  Assets: Communication Skills; Desire for Improvement; Physical Health; Social Support   Sleep  Sleep: Sleep: Poor    Physical Exam: Physical Exam Vitals and nursing note reviewed.  HENT:     Nose: Nose normal.  Pulmonary:     Effort: Pulmonary effort is normal.  Musculoskeletal:         General: Normal range of motion.     Cervical back: Normal range of motion.  Skin:    General: Skin is warm and dry.  Neurological:     Mental Status: He is alert and oriented to person, place, and time.    Review of Systems  Psychiatric/Behavioral:  Positive for depression, hallucinations, substance abuse and suicidal ideas. The patient is nervous/anxious.   All other systems reviewed and are negative.  Blood pressure 116/73, pulse 89, temperature 98.8 F (37.1 C), resp. rate 16, height 5\' 8"  (1.727 m), weight 83.9 kg, SpO2 98%. Body mass index is 28.12 kg/m.  Treatment Plan Summary:   Principal Problem:   Schizoaffective disorder, bipolar type (HCC) Active Problems:   Suicidal ideation   PTSD (post-traumatic stress disorder)   Insomnia   Paranoia (psychosis) (HCC)  Daily contact  with patient to assess and evaluate symptoms and progress in treatment and Medication management. Patient's case to be discussed in multi-disciplinary team meeting.  Observation Level/Precautions:  Voluntary admission; 15 minute checks; Precautions: assault, suicide  Laboratory:   labs reviewed; lithium level, A1c, lipid panel on 12/23/23  Psychotherapy:  recommend individual and group therapy  Medications:  see below  Consultations:  social work for discharge planning  Discharge Concerns:  need to safety plan (access to father's firearms); medication compliance and effectiveness; substance use  Estimated LOS: 5-7 days  Discharge planning: patient seeking long-term substance use treatment program, possibly [facility] in [place]   - Continue lithium 300mg  PO BID for schizoaffective disorder bipolar type, started 12/18/23 - Continue hydroxyzine 25mg  PO tid prn anxiety, started 12/18/23 - Continue quetiapine 200mg  PO at bedtime for schizoaffective disorder bipolar type, started 12/18/23 - Start quetiapine 50mg  PO TID PRN severe anxiety or hallucinations - Start gabapentin 300mg  PO TID for anxiety  (off-label)  --  The risks/benefits/side-effects/alternatives to this medication were discussed in detail with the patient and time was given for questions. The patient consents to medication trial.              -- Metabolic profile and EKG monitoring obtained while on an atypical antipsychotic (QTc: 433)              -- Encouraged patient to participate in unit milieu and in scheduled group therapies    Long Term Goal(s): Improvement in symptoms so as ready for discharge  Short Term Goals: Ability to identify changes in lifestyle to reduce recurrence of condition will improve, Ability to verbalize feelings will improve, Ability to disclose and discuss suicidal ideas, Ability to demonstrate self-control will improve, Ability to identify and develop effective coping behaviors will improve, Compliance with prescribed medications will improve, and Ability to identify triggers associated with substance abuse/mental health issues will improve  I certify that inpatient services furnished can reasonably be expected to improve the patient's condition.    Song Garris Robyne Peers, NP 1/16/20255:02 PM

## 2023-12-19 NOTE — Plan of Care (Signed)
  Problem: Education: Goal: Knowledge of [facility] Education information/materials will improve Outcome: Not Met (add Reason) Goal: Emotional status will improve Outcome: Progressing Goal: Mental status will improve Outcome: Not Met (add Reason) Goal: Verbalization of understanding the information provided will improve Outcome: Progressing   Problem: Activity: Goal: Interest or engagement in activities will improve Outcome: Progressing   Problem: Coping: Goal: Ability to verbalize frustrations and anger appropriately will improve Outcome: Progressing   Problem: Physical Regulation: Goal: Ability to maintain clinical measurements within normal limits will improve Outcome: Not Applicable   Problem: Safety: Goal: Periods of time without injury will increase Outcome: Progressing

## 2023-12-19 NOTE — Group Note (Signed)
[facility] LCSW Group Therapy Note   Group Date: 12/19/2023 Start Time: 1320 End Time: 1400   Type of Therapy/Topic:  Group Therapy:  Balance in Life  Participation Level:  Did Not Attend   Description of Group:    This group will address the concept of balance and how it feels and looks when one is unbalanced. Patients will be encouraged to process areas in their lives that are out of balance, and identify reasons for remaining unbalanced. Facilitators will guide patients utilizing problem- solving interventions to address and correct the stressor making their life unbalanced. Understanding and applying boundaries will be explored and addressed for obtaining  and maintaining a balanced life. Patients will be encouraged to explore ways to assertively make their unbalanced needs known to significant others in their lives, using other group members and facilitator for support and feedback.  Therapeutic Goals: Patient will identify two or more emotions or situations they have that consume much of in their lives. Patient will identify signs/triggers that life has become out of balance:  Patient will identify two ways to set boundaries in order to achieve balance in their lives:  Patient will demonstrate ability to communicate their needs through discussion and/or role plays  Summary of Patient Progress: X   Therapeutic Modalities:   Cognitive Behavioral Therapy Solution-Focused Therapy Assertiveness Training   Glenis Smoker, LCSW

## 2023-12-19 NOTE — Group Note (Signed)
Recreation Therapy Group Note   Group Topic:Relaxation  Group Date: 12/19/2023 Start Time: 1530 End Time: 1605 Facilitators: Rosina Lowenstein, LRT, CTRS Location:  Dayroom  Group Description: Meditation. LRT and patients discussed what they know about meditation and mindfulness. LRT played a Deep Breathing Meditation exercise script for patients to follow along to. LRT and patients discussed how meditation and deep breathing can be used as a coping skill post--discharge to help manage symptoms of stress.   Goal Area(s) Addressed: Patient will practice using relaxation technique. Patient will identify a new coping skill.  Patient will follow multistep directions to reduce anxiety and stress.  Affect/Mood: N/A   Participation Level: Did not attend    Clinical Observations/Individualized Feedback: Patient did not attend group.   Plan: Continue to engage patient in RT group sessions 2-3x/week.   Rosina Lowenstein, LRT, CTRS 12/19/2023 5:31 PM
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
## 2023-12-19 NOTE — Group Note (Signed)
Date:  12/19/2023 Time:  8:56 PM  Group Topic/Focus:  Wellness Toolbox:   The focus of this group is to discuss various aspects of wellness, balancing those aspects and exploring ways to increase the ability to experience wellness.  Patients will create a wellness toolbox for use upon discharge.    Participation Level:  Did Not Attend   Lenore Cordia 12/19/2023, 8:56 PM

## 2023-12-20 DIAGNOSIS — F250 Schizoaffective disorder, bipolar type: Secondary | ICD-10-CM | POA: Diagnosis not present

## 2023-12-20 MED ORDER — QUETIAPINE FUMARATE 25 MG PO TABS
50.0000 mg | ORAL_TABLET | Freq: Three times a day (TID) | ORAL | Status: DC | PRN
  Administered 2023-12-20 – 2023-12-21 (×2): 50 mg via ORAL
  Filled 2023-12-20 (×2): qty 2

## 2023-12-20 MED ORDER — OLANZAPINE 5 MG PO TBDP
5.0000 mg | ORAL_TABLET | ORAL | Status: AC
  Administered 2023-12-20: 5 mg via ORAL
  Filled 2023-12-20: qty 1

## 2023-12-20 MED ORDER — NICOTINE 21 MG/24HR TD PT24
21.0000 mg | MEDICATED_PATCH | Freq: Every day | TRANSDERMAL | Status: DC
  Administered 2023-12-20 – 2023-12-30 (×11): 21 mg via TRANSDERMAL
  Filled 2023-12-20 (×13): qty 1

## 2023-12-20 NOTE — BH IP Treatment Plan (Signed)
Interdisciplinary Treatment and Diagnostic Plan Update  12/20/2023 Time of Session: 09:12 Jeremiah Newman MRN: [id number]  Principal Diagnosis: Schizoaffective disorder, bipolar type [facility])  Secondary Diagnoses: Principal Problem:   Schizoaffective disorder, bipolar type (HCC) Active Problems:   Suicidal ideation   PTSD (post-traumatic stress disorder)   Insomnia   Paranoia (psychosis) (HCC)   Current Medications:  Current Facility-Administered Medications  Medication Dose Route Frequency Provider Last Rate Last Admin   acetaminophen (TYLENOL) tablet 650 mg  650 mg Oral Q6H PRN [name], [name], MD   650 mg at 12/20/23 0908   alum & mag hydroxide-simeth (MAALOX/MYLANTA) 200-200-20 MG/5ML suspension 30 mL  30 mL Oral Q4H PRN [name], [name], MD       haloperidol (HALDOL) tablet 5 mg  5 mg Oral TID PRN [name], MD       And   diphenhydrAMINE (BENADRYL) capsule 50 mg  50 mg Oral TID PRN [name], [name], MD       haloperidol lactate (HALDOL) injection 5 mg  5 mg Intramuscular TID PRN [name], [name], MD       And   diphenhydrAMINE (BENADRYL) injection 50 mg  50 mg Intramuscular TID PRN [name], [name], MD       And   LORazepam (ATIVAN) injection 2 mg  2 mg Intramuscular TID PRN [name], [name], MD       haloperidol lactate (HALDOL) injection 10 mg  10 mg Intramuscular TID PRN [name], [name], MD       And   diphenhydrAMINE (BENADRYL) injection 50 mg  50 mg Intramuscular TID PRN [name], [name], MD       And   LORazepam (ATIVAN) injection 2 mg  2 mg Intramuscular TID PRN [name], [name], MD       gabapentin (NEURONTIN) capsule 300 mg  300 mg Oral TID [name], [name], NP   300 mg at 12/20/23 0908   hydrOXYzine (ATARAX) tablet 25 mg  25 mg Oral TID PRN [name], MD   25 mg at 12/20/23 7829   lithium carbonate capsule 300 mg  300 mg Oral BID WC [name], [name], MD   300 mg at 12/20/23 0908   magnesium hydroxide (MILK OF MAGNESIA)  suspension 30 mL  30 mL Oral Daily PRN [name], [name], MD       nicotine (NICODERM CQ - dosed in mg/24 hours) patch 21 mg  21 mg Transdermal Daily [name], MD   21 mg at 12/20/23 0947   QUEtiapine (SEROQUEL) tablet 200 mg  200 mg Oral QHS [name], [name], MD   200 mg at 12/18/23 2141   PTA Medications: Medications Prior to Admission  Medication Sig Dispense Refill Last Dose/Taking   escitalopram (LEXAPRO) 10 MG tablet Take 3 tablets (30 mg total) by mouth daily. 90 tablet 0    escitalopram (LEXAPRO) 20 MG tablet Take 1 tablet (20 mg total) by mouth daily. 30 tablet 1    escitalopram (LEXAPRO) 20 MG tablet Take 1 tablet (20 mg total) by mouth daily. 30 tablet 0    gabapentin (NEURONTIN) 300 MG capsule Take 1 capsule (300 mg total) by mouth 3 (three) times daily. 90 capsule 0    mirtazapine (REMERON) 7.5 MG tablet Take 1 tablet (7.5 mg total) by mouth at bedtime. 30 tablet 0    pantoprazole (PROTONIX) 40 MG tablet Take 1 tablet (40 mg total) by mouth daily. 30 tablet 0    QUEtiapine (SEROQUEL) 200 MG tablet Take 1 tablet (200 mg total) by mouth at bedtime. (Patient  not taking: Reported on 12/18/2023) 30 tablet 0    traZODone (DESYREL) 100 MG tablet Take 1 tablet (100 mg total) by mouth at bedtime as needed for sleep. 30 tablet 0     Patient Stressors:    Patient Strengths:    Treatment Modalities: Medication Management, Group therapy, Case management,  1 to 1 session with clinician, Psychoeducation, Recreational therapy.   Physician Treatment Plan for Primary Diagnosis: Schizoaffective disorder, bipolar type (HCC) Long Term Goal(s): Improvement in symptoms so as ready for discharge   Short Term Goals: Ability to identify changes in lifestyle to reduce recurrence of condition will improve Ability to verbalize feelings will improve Ability to disclose and discuss suicidal ideas Ability to demonstrate self-control will improve Ability to identify and develop effective coping  behaviors will improve Compliance with prescribed medications will improve Ability to identify triggers associated with substance abuse/mental health issues will improve  Medication Management: Evaluate patient's response, side effects, and tolerance of medication regimen.  Therapeutic Interventions: 1 to 1 sessions, Unit Group sessions and Medication administration.  Evaluation of Outcomes: Not Met  Physician Treatment Plan for Secondary Diagnosis: Principal Problem:   Schizoaffective disorder, bipolar type (HCC) Active Problems:   Suicidal ideation   PTSD (post-traumatic stress disorder)   Insomnia   Paranoia (psychosis) (HCC)  Long Term Goal(s): Improvement in symptoms so as ready for discharge   Short Term Goals: Ability to identify changes in lifestyle to reduce recurrence of condition will improve Ability to verbalize feelings will improve Ability to disclose and discuss suicidal ideas Ability to demonstrate self-control will improve Ability to identify and develop effective coping behaviors will improve Compliance with prescribed medications will improve Ability to identify triggers associated with substance abuse/mental health issues will improve     Medication Management: Evaluate patient's response, side effects, and tolerance of medication regimen.  Therapeutic Interventions: 1 to 1 sessions, Unit Group sessions and Medication administration.  Evaluation of Outcomes: Not Met   RN Treatment Plan for Primary Diagnosis: Schizoaffective disorder, bipolar type (HCC) Long Term Goal(s): Knowledge of disease and therapeutic regimen to maintain health will improve  Short Term Goals: Ability to remain free from injury will improve, Ability to verbalize frustration and anger appropriately will improve, Ability to demonstrate self-control, Ability to participate in decision making will improve, Ability to verbalize feelings will improve, Ability to disclose and discuss suicidal  ideas, Ability to identify and develop effective coping behaviors will improve, and Compliance with prescribed medications will improve  Medication Management: RN will administer medications as ordered by provider, will assess and evaluate patient's response and provide education to patient for prescribed medication. RN will report any adverse and/or side effects to prescribing provider.  Therapeutic Interventions: 1 on 1 counseling sessions, Psychoeducation, Medication administration, Evaluate responses to treatment, Monitor vital signs and CBGs as ordered, Perform/monitor CIWA, COWS, AIMS and Fall Risk screenings as ordered, Perform wound care treatments as ordered.  Evaluation of Outcomes: Not Met   LCSW Treatment Plan for Primary Diagnosis: Schizoaffective disorder, bipolar type (HCC) Long Term Goal(s): Safe transition to appropriate next level of care at discharge, Engage patient in therapeutic group addressing interpersonal concerns.  Short Term Goals: Engage patient in aftercare planning with referrals and resources, Increase social support, Increase ability to appropriately verbalize feelings, Increase emotional regulation, Facilitate acceptance of mental health diagnosis and concerns, Facilitate patient progression through stages of change regarding substance use diagnoses and concerns, Identify triggers associated with mental health/substance abuse issues, and Increase skills for wellness and  recovery  Therapeutic Interventions: Assess for all discharge needs, 1 to 1 time with Child psychotherapist, Explore available resources and support systems, Assess for adequacy in community support network, Educate family and significant other(s) on suicide prevention, Complete Psychosocial Assessment, Interpersonal group therapy.  Evaluation of Outcomes: Not Met   Progress in Treatment: Attending groups: No. Participating in groups: No. Taking medication as prescribed: Yes. Toleration medication:  Yes. Family/Significant other contact made: No, will contact:  if given permission.  Patient understands diagnosis: Yes. Discussing patient identified problems/goals with staff: Yes. Medical problems stabilized or resolved: Yes. Denies suicidal/homicidal ideation: Yes. Issues/concerns per patient self-inventory: No. Other: none.   New problem(s) identified: No, Describe:  none identified.   New Short Term/Long Term Goal(s): detox, elimination of symptoms of psychosis, medication management for mood stabilization; elimination of SI thoughts; development of comprehensive mental wellness/sobriety plan.  Patient Goals:  "I need to be in a structured environment."  Discharge Plan or Barriers: CSW will assist pt with development of an appropriate aftercare/discharge plan.   Reason for Continuation of Hospitalization: Depression Hallucinations Medication stabilization Suicidal ideation  Estimated Length of Stay: 1-7 days  Last 3 [place] Suicide Severity Risk Score: Flowsheet Row Admission (Current) from 12/18/2023 in [facility]PATIENT BEHAV[facility]AL MEDICINE Most recent reading at 12/18/2023  4:32 PM ED from 12/18/2023 in [facility] Emergency Department at [facility] Most recent reading at 12/18/2023 12:17 PM Admission (Discharged) from 10/09/2023 in [facility] INPATIENT BEHAVIORAL MEDICINE Most recent reading at 10/09/2023  4:00 AM  C-SSRS RISK CATEGORY High Risk High Risk Moderate Risk       Last PHQ 2/9 Scores:     No data to display          Scribe for Treatment Team: Glenis Smoker, LCSW 12/20/2023 10:25 AM

## 2023-12-20 NOTE — Progress Notes (Signed)
   12/20/23 1100  Psych Admission Type (Psych Patients Only)  Admission Status Voluntary  Psychosocial Assessment  Patient Complaints Anxiety;Depression;Sleep disturbance;Hopelessness;Self-harm thoughts  Eye Contact Other (Comment) (WNL)  Facial Expression Flat  Affect Anxious;Sad  Speech Logical/coherent  Interaction Assertive  Motor Activity Other (Comment) (WNL)  Appearance/Hygiene In scrubs  Behavior Characteristics Appropriate to situation  Mood Depressed  Thought Process  Coherency WDL  Content Preoccupation  Delusions None reported or observed  Perception WDL  Hallucination None reported or observed  Judgment WDL  Confusion WDL  Danger to Self  Current suicidal ideation? Passive  Self-Injurious Behavior No self-injurious ideation or behavior indicators observed or expressed   Agreement Not to Harm Self No  Description of Agreement verbal

## 2023-12-20 NOTE — Group Note (Signed)
Recreation Therapy Group Note   Group Topic:Leisure Education  Group Date: 12/20/2023 Start Time: 1000 End Time: 1100 Facilitators: Rosina Lowenstein, LRT, CTRS Location:  Craft Room  Group Description: Leisure. Patients were given the option to choose from singing karaoke, coloring mandalas, using oil pastels, journaling, or playing with play-doh. LRT and pts discussed the meaning of leisure, the importance of participating in leisure during their free time/when they're outside of the hospital, as well as how our leisure interests can also serve as coping skills.   Goal Area(s) Addressed:  Patient will identify a current leisure interest.  Patient will learn the definition of "leisure". Patient will practice making a positive decision. Patient will have the opportunity to try a new leisure activity. Patient will communicate with peers and LRT.    Affect/Mood: N/A   Participation Level: Did not attend    Clinical Observations/Individualized Feedback: Patient did not attend group.   Plan: Continue to engage patient in RT group sessions 2-3x/week.   Rosina Lowenstein, LRT, CTRS 12/20/2023 11:39 AM

## 2023-12-20 NOTE — Plan of Care (Signed)
  Problem: Education: Goal: Knowledge of  General Education information/materials will improve Outcome: Progressing Goal: Emotional status will improve Outcome: Progressing Goal: Mental status will improve Outcome: Progressing Goal: Verbalization of understanding the information provided will improve Outcome: Progressing   Problem: Activity: Goal: Interest or engagement in activities will improve 12/20/2023 1750 by Earleen Newport, RN Outcome: Progressing 12/20/2023 1749 by Earleen Newport, RN Outcome: Progressing Goal: Sleeping patterns will improve 12/20/2023 1750 by Earleen Newport, RN Outcome: Progressing 12/20/2023 1749 by Earleen Newport, RN Outcome: Progressing   Problem: Coping: Goal: Ability to demonstrate self-control will improve Outcome: Progressing   Problem: Health Behavior/Discharge Planning: Goal: Compliance with treatment plan for underlying cause of condition will improve Outcome: Progressing   Problem: Safety: Goal: Periods of time without injury will increase Outcome: Progressing

## 2023-12-20 NOTE — BHH Counselor (Addendum)
CSW met with pt briefly to check-in regarding any interest in SUD treatment facilities from the resource list. He shared that he was interested in the [facility]. Pt stated that he called yesterday and left a message but has not heard back from them. Then he stated that he was also interested in [facility] but it had taken so long to hear back from them. CSW shared that he would reach out and see what he could find out. No other concerns expressed. Contact ended without incident.   Vilma Meckel. Algis Greenhouse, MSW, LCSW, LCAS 12/20/2023 1:29 PM  ADDENDUM:  CSW contacted [employer] [phone]) regarding referral. CSW was informed that they typically need to start with a phone screening. But to begin the process CSW could send over information to include H&P, vitals, and medication list to [phone]. CSW informed Eunice Blase that pt had attempted to complete the phone screening. She shared that the number he left to call back had been attempted but no one had picked up. She shared that he could leave the nurses' station number along with his code which would make it easier. She asked if CSW knew his code. CSW explained that pt would need to give her this information himself. She voiced understanding. It was recommended that pt try calling back on Tuesday, 12/24/23 at 11AM for the phone screening. CSW also agreed to send over paperwork which had been discussed. No other concerns expressed. Contact ended without incident.   CSW faxed over requested paperwork to [phone].  CSW met with pt briefly to update regarding contact. Pt also received applications for [facility] and [facility] [place] [facility] for completion.   Vilma Meckel. Algis Greenhouse, MSW, LCSW, LCAS 12/20/2023 3:19 PM

## 2023-12-20 NOTE — Group Note (Signed)
Recreation Therapy Group Note   Group Topic:General Recreation  Group Date: 12/20/2023 Start Time: 1530 End Time: 1620 Facilitators: Clinton Gallant, CTRS Location:  Craft [facility]  Group Description: Bingo. LRT and patients played multiple games of Bingo with music playing in the background. LRT and pts discussed how this could be a leisure interest and the importance of doing things they enjoy post-discharge. Pts won a Music therapist of candy as a Scientist, research (physical sciences), approved by [employer].    Goal Area(s) Addressed: Patient will identify leisure interests.  Patient will practice healthy decision making. Patient will engage in recreation activity.  Patient will increase communication.    Affect/Mood: Appropriate   Participation Level: Minimal    Clinical Observations/Individualized Feedback: Jeremiah Newman came late to group and left early. Pt was present for maybe 10 minutes.  Plan: Continue to engage patient in RT group sessions 2-3x/week.   Jeremiah Newman, LRT, CTRS 12/20/2023 5:33 PM

## 2023-12-20 NOTE — Group Note (Signed)
Date:  12/20/2023 Time:  9:45 PM  Group Topic/Focus:  Self Care:   The focus of this group is to help patients understand the importance of self-care in order to improve or restore emotional, physical, spiritual, interpersonal, and financial health.    Participation Level:  Minimal  Participation Quality:    Affect:    Cognitive:    Insight:   Engagement in Group:    Modes of Intervention:    Additional Comments:    Jeremiah Newman 12/20/2023, 9:45 PM

## 2023-12-20 NOTE — Group Note (Signed)
Date:  12/20/2023 Time:  10:08 AM  Group Topic/Focus:  Overcoming Stress:   The focus of this group is to define stress and help patients assess their triggers.    Participation Level:  Did Not Attend   [name] 12/20/2023, 10:08 AM

## 2023-12-21 DIAGNOSIS — F250 Schizoaffective disorder, bipolar type: Secondary | ICD-10-CM | POA: Diagnosis not present

## 2023-12-21 MED ORDER — OLANZAPINE 10 MG PO TABS
10.0000 mg | ORAL_TABLET | Freq: Two times a day (BID) | ORAL | Status: DC
  Administered 2023-12-21 – 2023-12-26 (×11): 10 mg via ORAL
  Filled 2023-12-21 (×11): qty 1

## 2023-12-21 MED ORDER — HYDROXYZINE HCL 50 MG PO TABS
50.0000 mg | ORAL_TABLET | Freq: Three times a day (TID) | ORAL | Status: DC | PRN
  Administered 2023-12-22: 50 mg via ORAL
  Filled 2023-12-21: qty 1

## 2023-12-21 NOTE — Progress Notes (Signed)
Suicide Risk Assessment  Admission Assessment    [facility] Admission Suicide Risk Assessment   Nursing information obtained from:  Patient Demographic factors:  Male, Low socioeconomic status, Unemployed Current Mental Status:  Suicidal ideation indicated by patient Loss Factors:  NA Historical Factors:  NA Risk Reduction Factors:  Living with another person, especially a relative, Positive social support  Total Time spent with patient: 1 hour Principal Problem: Schizoaffective disorder, bipolar type (HCC) Diagnosis:  Principal Problem:   Schizoaffective disorder, bipolar type (HCC) Active Problems:   Suicidal ideation   PTSD (post-traumatic stress disorder)   Insomnia   Paranoia (psychosis) (HCC)  Subjective Data: Patient endorses suicidal and homicidal ideation causing acute distress. No intent, plan, or means. Reports commanding auditory hallucinations. Three previous suicide attempts, most recent 1 year ago via hanging.  Continued Clinical Symptoms:  Alcohol Use Disorder Identification Test Final Score (AUDIT): 6 The "Alcohol Use Disorders Identification Test", Guidelines for Use in Primary Care, Second Edition.  World Science writer [facility]). Score between 0-7:  no or low risk or alcohol related problems. Score between 8-15:  moderate risk of alcohol related problems. Score between 16-19:  high risk of alcohol related problems. Score 20 or above:  warrants further diagnostic evaluation for alcohol dependence and treatment.   CLINICAL FACTORS:   Severe Anxiety and/or Agitation Panic Attacks Bipolar Disorder:   Mixed State Alcohol/Substance Abuse/Dependencies Schizophrenia:   Command hallucinatons Less than 47 years old More than one psychiatric diagnosis Currently Psychotic Unstable or Poor Therapeutic Relationship Previous Psychiatric Diagnoses and Treatments   Musculoskeletal: Strength & Muscle Tone: within normal limits Gait & Station: normal Patient leans:  N/A  Psychiatric Specialty Exam:  Presentation  General Appearance:  Appropriate for Environment  Eye Contact: Good  Speech: Clear and Coherent  Speech Volume: Normal  Handedness: Right   Mood and Affect  Mood: Anxious; Depressed; Hopeless  Affect: Flat   Thought Process  Thought Processes: Coherent  Descriptions of Associations:Intact  Orientation:Full (Time, Place and Person)  Thought Content:Delusions; Logical; Paranoid Ideation; Perseveration  History of Schizophrenia/Schizoaffective disorder:Yes  Duration of Psychotic Symptoms:Greater than six months  Hallucinations:Hallucinations: Auditory; Visual Description of Auditory Hallucinations: multiple voices, telling him to kill himself and others Description of Visual Hallucinations: seeing shadows  Ideas of Reference:Paranoia; Percusatory  Suicidal Thoughts:Suicidal Thoughts: Yes, Passive SI Passive Intent and/or Plan: Without Intent; Without Plan  Homicidal Thoughts:Homicidal Thoughts: No   Sensorium  Memory: Immediate Good; Recent Good; Remote Good  Judgment: Fair  Insight: Good   Executive Functions  Concentration: Fair  Attention Span: Fair  Recall: Fair  Fund of Knowledge: Fair  Language: Fair   Psychomotor Activity  Psychomotor Activity: Psychomotor Activity: Increased; Restlessness   Assets  Assets: Manufacturing systems engineer; Desire for Improvement; Physical Health; Resilience   Sleep  Sleep: Sleep: Good    Physical Exam: Physical Exam see h&p ROS see h&p Blood pressure 115/68, pulse 78, temperature 98.3 F (36.8 C), temperature source Oral, resp. rate 17, height 5\' 8"  (1.727 m), weight 83.9 kg, SpO2 98%. Body mass index is 28.12 kg/m.   COGNITIVE FEATURES THAT CONTRIBUTE TO RISK:  None    SUICIDE RISK:   Severe:  Frequent, intense, and enduring suicidal ideation, specific plan, no subjective intent, but some objective markers of intent (i.e., choice of  lethal method), the method is accessible, some limited preparatory behavior, severe dysphoria/symptomatology, multiple risk factors present, and few if any protective factors.  PLAN OF CARE: Continue inpatient hospitalization, Q15 min observation  I certify  that inpatient services furnished can reasonably be expected to improve the patient's condition.   Gola Bribiesca Robyne Peers, NP

## 2023-12-21 NOTE — Progress Notes (Signed)
   12/20/23 2106  Psych Admission Type (Psych Patients Only)  Admission Status Voluntary  Psychosocial Assessment  Patient Complaints Anxiety;Sleep disturbance;Hopelessness;Self-harm thoughts ("I have all the above, so whatever I can have to help me rest I appreciate it".)  Eye Contact Fair  Facial Expression Flat  Affect Anxious;Sad  Speech Logical/coherent  Interaction Assertive  Motor Activity Pacing  Appearance/Hygiene Unremarkable  Behavior Characteristics Appropriate to situation  Mood Depressed  Aggressive Behavior  Effect No apparent injury  Thought Process  Coherency WDL  Content Preoccupation  Delusions None reported or observed  Perception WDL  Hallucination None reported or observed  Judgment WDL  Confusion WDL  Danger to Self  Current suicidal ideation? Passive  Self-Injurious Behavior No self-injurious ideation or behavior indicators observed or expressed   Agreement Not to Harm Self Yes  Description of Agreement Verbal  Danger to Others  Danger to Others None reported or observed

## 2023-12-21 NOTE — Plan of Care (Signed)
?  Problem: Education: ?Goal: Knowledge of  General Education information/materials will improve ?Outcome: Progressing ?Goal: Emotional status will improve ?Outcome: Progressing ?Goal: Mental status will improve ?Outcome: Progressing ?Goal: Verbalization of understanding the information provided will improve ?Outcome: Progressing ?  ?Problem: Activity: ?Goal: Interest or engagement in activities will improve ?Outcome: Progressing ?Goal: Sleeping patterns will improve ?Outcome: Progressing ?  ?Problem: Coping: ?Goal: Ability to verbalize frustrations and anger appropriately will improve ?Outcome: Progressing ?Goal: Ability to demonstrate self-control will improve ?Outcome: Progressing ?  ?Problem: Health Behavior/Discharge Planning: ?Goal: Identification of resources available to assist in meeting health care needs will improve ?Outcome: Progressing ?Goal: Compliance with treatment plan for underlying cause of condition will improve ?Outcome: Progressing ?  ?Problem: Safety: ?Goal: Periods of time without injury will increase ?Outcome: Progressing ?  ?

## 2023-12-21 NOTE — Progress Notes (Signed)
   12/21/23 0900  Psych Admission Type (Psych Patients Only)  Admission Status Voluntary  Psychosocial Assessment  Patient Complaints Anxiety;Depression;Hopelessness;Panic attack;Self-harm thoughts;Other (Comment) (Per patient "it's wierd, I can't stop the voices and my mind goes to the worst places. That sends me to a state of panic".)  Eye Contact Fair  Facial Expression Worried  Affect Anxious;Preoccupied;Sullen  Speech Logical/coherent;Soft  Interaction Assertive;Isolative (per patient, he isolates to room so that he won't hurt anybody".)  Motor Activity Slow  Appearance/Hygiene Unremarkable  Behavior Characteristics Cooperative;Appropriate to situation  Mood Anxious;Preoccupied;Pleasant  Aggressive Behavior  Effect No apparent injury  Thought Process  Coherency Circumstantial  Content Preoccupation;Paranoia  Delusions Paranoid  Perception Hallucinations  Hallucination Auditory  Judgment Impaired  Confusion None  Danger to Self  Current suicidal ideation? Passive  Self-Injurious Behavior Some self-injurious ideation observed or expressed.  No lethal plan expressed   Agreement Not to Harm Self Yes  Description of Agreement Verbal  Danger to Others  Danger to Others None reported or observed

## 2023-12-21 NOTE — Group Note (Signed)
LCSW Group Therapy Note   Group Date: 12/21/2023 Start Time: 1330 End Time: 1405   Type of Therapy and Topic:  Group Therapy: Goals after Discharge  Participation Level:  Minimal      Summary of Patient Progress:  The patient attended group. Patient proved open to input from peers and feedback from [facility]. The patient was respectful of peers and participated throughout the entire session. The patient participated during today's icebreaker question.     Marshell Levan, LCSWA 12/21/2023  2:23 PM

## 2023-12-21 NOTE — Progress Notes (Signed)
[facility] MD Progress Note   Jeremiah Newman  MRN:  [id number]  Subjective:  Jeremiah Newman is a 33 year old Caucasian male with a history of schizoaffective disorder bipolar type and PTSD who presented voluntarily with reports of suicidal ideation, command auditory hallucinations, intrusive thoughts, and paranoia.   Chart reviewed, case discussed with multidisciplinary team, patient seen on rounds. Anh has been visible in the milieu today, eating meals. Minimal interaction with peers and staff. He is adherent with medication regimen and denies side effects. He continues to report significant anxiety, agitation, auditory hallucinations, intrusive thoughts. Slept well overnight. He continues to endorse suicidal thoughts. Feels his family would be better off without him. Has violent intrusive thoughts but does not want to act of them. He is working to find a long-term substance use treatment program and was observed calling programs today. He is currently prescribed quetiapine and unfortunately this medication is not allowed at most substance use treatment facilities. Therefore will change to olanzapine to target mood and psychosis.  Principal Problem: Schizoaffective disorder, bipolar type (HCC) Diagnosis: Principal Problem:   Schizoaffective disorder, bipolar type (HCC) Active Problems:   Suicidal ideation   PTSD (post-traumatic stress disorder)   Insomnia   Paranoia (psychosis) (HCC)  Total Time spent with patient: 20 minutes  Past Psychiatric History: see h&p  Past Medical History: History reviewed. No pertinent past medical history. History reviewed. No pertinent surgical history. Family History: History reviewed. No pertinent family history. Family Psychiatric  History: see h&p Social History:  Social History   Substance and Sexual Activity  Alcohol Use Yes     Social History   Substance and Sexual Activity  Drug Use No    Social History   Socioeconomic History   Marital  status: Single    Spouse name: Not on file   Number of children: Not on file   Years of education: Not on file   Highest education level: Not on file  Occupational History   Not on file  Tobacco Use   Smoking status: Some Days    Current packs/day: 1.00    Average packs/day: 1 pack/day for 9.0 years (9.0 ttl pk-yrs)    Types: Cigarettes    Start date: 2016   Smokeless tobacco: Never  Substance and Sexual Activity   Alcohol use: Yes   Drug use: No   Sexual activity: Not Currently  Other Topics Concern   Not on file  Social History Narrative   Not on file   Social Drivers of Health   Financial Resource Strain: Not on file  Food Insecurity: Food Insecurity Present (12/18/2023)   Hunger Vital Sign    Worried About Running Out of Food in the Last Year: Often true    Ran Out of Food in the Last Year: Often true  Transportation Needs: Unmet Transportation Needs (12/18/2023)   PRAPARE - Administrator, Civil Service (Medical): Yes    Lack of Transportation (Non-Medical): Yes  Physical Activity: Not on file  Stress: Not on file  Social Connections: Not on file   Additional Social History:                         Sleep: Good  Appetite:  Good  Current Medications: Current Facility-Administered Medications  Medication Dose Route Frequency Provider Last Rate Last Admin   acetaminophen (TYLENOL) tablet 650 mg  650 mg Oral Q6H PRN [name], MD   650 mg at 12/20/23 2106  alum & mag hydroxide-simeth (MAALOX/MYLANTA) 200-200-20 MG/5ML suspension 30 mL  30 mL Oral Q4H PRN [name], [name], MD       haloperidol (HALDOL) tablet 5 mg  5 mg Oral TID PRN [name], MD       And   diphenhydrAMINE (BENADRYL) capsule 50 mg  50 mg Oral TID PRN [name], [name], MD       haloperidol lactate (HALDOL) injection 5 mg  5 mg Intramuscular TID PRN [name], [name], MD       And   diphenhydrAMINE (BENADRYL) injection 50 mg  50 mg Intramuscular TID PRN  [name], [name], MD       And   LORazepam (ATIVAN) injection 2 mg  2 mg Intramuscular TID PRN [name], [name], MD       haloperidol lactate (HALDOL) injection 10 mg  10 mg Intramuscular TID PRN [name], [name], MD       And   diphenhydrAMINE (BENADRYL) injection 50 mg  50 mg Intramuscular TID PRN [name], [name], MD       And   LORazepam (ATIVAN) injection 2 mg  2 mg Intramuscular TID PRN [name], [name], MD       gabapentin (NEURONTIN) capsule 300 mg  300 mg Oral TID [name], NP   300 mg at 12/21/23 1740   hydrOXYzine (ATARAX) tablet 50 mg  50 mg Oral TID PRN [name], NP       lithium carbonate capsule 300 mg  300 mg Oral BID WC [name], [name], MD   300 mg at 12/21/23 1740   magnesium hydroxide (MILK OF MAGNESIA) suspension 30 mL  30 mL Oral Daily PRN [name], [name], MD       nicotine (NICODERM CQ - dosed in mg/24 hours) patch 21 mg  21 mg Transdermal Daily [name], MD   21 mg at 12/21/23 0846   OLANZapine (ZYPREXA) tablet 10 mg  10 mg Oral BID [name], NP   10 mg at 12/21/23 1751    Lab Results: No results found for this or any previous visit (from the past 48 hours).  Blood Alcohol level:  Lab Results  Component Value Date   ETH <10 12/18/2023   ETH <10 10/08/2023    Metabolic Disorder Labs: Lab Results  Component Value Date   HGBA1C 4.9 10/10/2023   MPG 93.93 10/10/2023   No results found for: "PROLACTIN" Lab Results  Component Value Date   CHOL 214 (H) 10/10/2023   TRIG 135 10/10/2023   HDL 41 10/10/2023   CHOLHDL 5.2 10/10/2023   VLDL 27 10/10/2023   LDLCALC 146 (H) 10/10/2023    Physical Findings: AIMS:  , ,  ,  ,    CIWA:    COWS:     Musculoskeletal: Strength & Muscle Tone: within normal limits Gait & Station: normal Patient leans: N/A  Psychiatric Specialty Exam:  Presentation  General Appearance:  Appropriate for Environment  Eye Contact: Good  Speech: Clear and Coherent  Speech  Volume: Normal  Handedness: Right   Mood and Affect  Mood: Anxious; Depressed; Hopeless  Affect: Flat   Thought Process  Thought Processes: Coherent  Descriptions of Associations:Intact  Orientation:Full (Time, Place and Person)  Thought Content:Delusions; Logical; Paranoid Ideation; Perseveration  History of Schizophrenia/Schizoaffective disorder:Yes  Duration of Psychotic Symptoms:Greater than six months  Hallucinations:Hallucinations: Auditory; Visual Description of Auditory Hallucinations: multiple voices, telling him to kill himself and others Description of Visual Hallucinations: seeing shadows  Ideas of Reference:Paranoia; Percusatory  Suicidal  Thoughts:Suicidal Thoughts: Yes, Passive SI Passive Intent and/or Plan: Without Intent; Without Plan  Homicidal Thoughts:Yes, intrusive thoughts without intent or plan   Sensorium  Memory: Immediate Good; Recent Good; Remote Good  Judgment: Fair  Insight: Good   Executive Functions  Concentration: Fair  Attention Span: Fair  Recall: Fair  Fund of Knowledge: Fair  Language: Fair   Psychomotor Activity  Psychomotor Activity:Psychomotor Activity: Increased; Restlessness   Assets  Assets: Manufacturing systems engineer; Desire for Improvement; Physical Health; Resilience   Sleep  Sleep:Good   Physical Exam: Physical Exam Vitals and nursing note reviewed.  HENT:     Nose: Nose normal.  Pulmonary:     Effort: Pulmonary effort is normal.  Musculoskeletal:        General: Normal range of motion.     Cervical back: Normal range of motion.  Skin:    General: Skin is warm and dry.  Neurological:     Mental Status: He is alert.    Review of Systems  Psychiatric/Behavioral:  Positive for depression, hallucinations, substance abuse and suicidal ideas. The patient is nervous/anxious.   All other systems reviewed and are negative.  Blood pressure (!) 146/82, pulse 88, temperature 98.8 F (37.1  C), resp. rate 17, height 5\' 8"  (1.727 m), weight 83.9 kg, SpO2 97%. Body mass index is 28.12 kg/m.   Treatment Plan Summary:  Daily contact with patient to assess and evaluate symptoms and progress in treatment and Medication management. Patient's case to be discussed in multi-disciplinary team meeting.   Observation Level/Precautions:  Voluntary admission; 15 minute checks; Precautions: assault, suicide  Laboratory:   labs reviewed; lithium level, A1c, lipid panel, TSH, RPR on 12/23/23  Psychotherapy:  recommend individual and group therapy  Medications:  see below  Consultations:  social work for discharge planning  Discharge Concerns:  need to safety plan (access to father's firearms); medication compliance and effectiveness; substance use  Estimated LOS: 5-7 days  Discharge planning: patient seeking long-term substance use treatment program, possibly [facility] in [place]    - Continue lithium 300mg  PO BID for schizoaffective disorder bipolar type, started 12/18/23 - Continue hydroxyzine 25mg  PO tid prn anxiety, started 12/18/23 - Discontinue quetiapine - Continue gabapentin 300mg  PO TID for anxiety (off-label), started 1/16 - Start olanzapine 10mg  PO BID for schizoaffective disorder bipolar type, started 1/18 --  The risks/benefits/side-effects/alternatives to this medication were discussed in detail with the patient and time was given for questions. The patient consents to medication trial.              -- Metabolic profile and EKG monitoring obtained while on an atypical antipsychotic (QTc: 433)              -- Encouraged patient to participate in unit milieu and in scheduled group therapies     Long Term Goal(s): Improvement in symptoms so as ready for discharge   Short Term Goals: Ability to identify changes in lifestyle to reduce recurrence of condition will improve, Ability to verbalize feelings will improve, Ability to disclose and discuss suicidal ideas, Ability to demonstrate  self-control will improve, Ability to identify and develop effective coping behaviors will improve, Compliance with prescribed medications will improve, and Ability to identify triggers associated with substance abuse/mental health issues will improve  Abel Ra Robyne Peers, NP

## 2023-12-21 NOTE — Progress Notes (Signed)
[facility] MD Progress Note   WILLSON GRATE  MRN:  [id number]  Subjective:  Jeremiah Newman is a 33 year old Caucasian male with a history of schizoaffective disorder bipolar type and PTSD who presented voluntarily with reports of suicidal ideation, command auditory hallucinations, intrusive thoughts, and paranoia.   Chart reviewed, case discussed with multidisciplinary team, patient seen on rounds. [name] has spent most of the day isolating in his room. He is pleasant but distressed, voices concern for being around others as he does not want to harm anymore. Experiencing intrusive thoughts of harming self and others. Reports commanding auditory hallucinations to harm self and others. Denies intent, plan, means. His mood is depressed, hopeless, helpless. [facility]. Carlyon Prows his family is in danger and they'd be better off without him. Declined quetiapine last night "because I was already asleep". Provided psychoeducation of antipsychotic and mood stabilizing effects of quetiapine.  Principal Problem: Schizoaffective disorder, bipolar type (HCC) Diagnosis: Principal Problem:   Schizoaffective disorder, bipolar type (HCC) Active Problems:   Suicidal ideation   PTSD (post-traumatic stress disorder)   Insomnia   Paranoia (psychosis) (HCC)  Total Time spent with patient: 20 minutes  Past Psychiatric History: see h&p  Past Medical History: History reviewed. No pertinent past medical history. History reviewed. No pertinent surgical history. Family History: History reviewed. No pertinent family history. Family Psychiatric  History: see h&p Social History:  Social History   Substance and Sexual Activity  Alcohol Use Yes     Social History   Substance and Sexual Activity  Drug Use No    Social History   Socioeconomic History   Marital status: Single    Spouse name: Not on file   Number of children: Not on file   Years of education: Not on file    Highest education level: Not on file  Occupational History   Not on file  Tobacco Use   Smoking status: Some Days    Current packs/day: 1.00    Average packs/day: 1 pack/day for 9.0 years (9.0 ttl pk-yrs)    Types: Cigarettes    Start date: 2016   Smokeless tobacco: Never  Substance and Sexual Activity   Alcohol use: Yes   Drug use: No   Sexual activity: Not Currently  Other Topics Concern   Not on file  Social History Narrative   Not on file   Social Drivers of Health   Financial Resource Strain: Not on file  Food Insecurity: Food Insecurity Present (12/18/2023)   Hunger Vital Sign    Worried About Running Out of Food in the Last Year: Often true    Ran Out of Food in the Last Year: Often true  Transportation Needs: Unmet Transportation Needs (12/18/2023)   PRAPARE - Administrator, Civil Service (Medical): Yes    Lack of Transportation (Non-Medical): Yes  Physical Activity: Not on file  Stress: Not on file  Social Connections: Not on file   Additional Social History:                         Sleep: Good  Appetite:  Good  Current Medications: Current Facility-Administered Medications  Medication Dose Route Frequency Provider Last Rate Last Admin   acetaminophen (TYLENOL) tablet 650 mg  650 mg Oral Q6H PRN [name], [name], MD   650 mg at 12/20/23 2106   alum & mag hydroxide-simeth (MAALOX/MYLANTA) 200-200-20 MG/5ML suspension 30 mL  30  mL Oral Q4H PRN [name], [name], MD       haloperidol (HALDOL) tablet 5 mg  5 mg Oral TID PRN [name], MD       And   diphenhydrAMINE (BENADRYL) capsule 50 mg  50 mg Oral TID PRN [name], [name], MD       haloperidol lactate (HALDOL) injection 5 mg  5 mg Intramuscular TID PRN [name], [name], MD       And   diphenhydrAMINE (BENADRYL) injection 50 mg  50 mg Intramuscular TID PRN [name], [name], MD       And   LORazepam (ATIVAN) injection 2 mg  2 mg Intramuscular TID PRN [name],  [name], MD       haloperidol lactate (HALDOL) injection 10 mg  10 mg Intramuscular TID PRN [name], [name], MD       And   diphenhydrAMINE (BENADRYL) injection 50 mg  50 mg Intramuscular TID PRN [name], [name], MD       And   LORazepam (ATIVAN) injection 2 mg  2 mg Intramuscular TID PRN [name], [name], MD       gabapentin (NEURONTIN) capsule 300 mg  300 mg Oral TID [name], NP   300 mg at 12/21/23 0845   hydrOXYzine (ATARAX) tablet 25 mg  25 mg Oral TID PRN [name], MD   25 mg at 12/21/23 0845   lithium carbonate capsule 300 mg  300 mg Oral BID WC [name], [name], MD   300 mg at 12/21/23 0845   magnesium hydroxide (MILK OF MAGNESIA) suspension 30 mL  30 mL Oral Daily PRN [name], [name], MD       nicotine (NICODERM CQ - dosed in mg/24 hours) patch 21 mg  21 mg Transdermal Daily [name], MD   21 mg at 12/21/23 0846   QUEtiapine (SEROQUEL) tablet 200 mg  200 mg Oral QHS [name], [name], MD   200 mg at 12/20/23 2105   QUEtiapine (SEROQUEL) tablet 50 mg  50 mg Oral TID PRN [name], NP   50 mg at 12/21/23 0844    Lab Results: No results found for this or any previous visit (from the past 48 hours).  Blood Alcohol level:  Lab Results  Component Value Date   ETH <10 12/18/2023   ETH <10 10/08/2023    Metabolic Disorder Labs: Lab Results  Component Value Date   HGBA1C 4.9 10/10/2023   MPG 93.93 10/10/2023   No results found for: "PROLACTIN" Lab Results  Component Value Date   CHOL 214 (H) 10/10/2023   TRIG 135 10/10/2023   HDL 41 10/10/2023   CHOLHDL 5.2 10/10/2023   VLDL 27 10/10/2023   LDLCALC 146 (H) 10/10/2023    Physical Findings: AIMS:  , ,  ,  ,    CIWA:    COWS:     Musculoskeletal: Strength & Muscle Tone: within normal limits Gait & Station: normal Patient leans: N/A  Psychiatric Specialty Exam:  Presentation  General Appearance:  Disheveled  Eye Contact: Fair  Speech: Clear and  Coherent  Speech Volume: Normal  Handedness: Right   Mood and Affect  Mood: Anxious; Depressed  Affect: Blunt   Thought Process  Thought Processes: Coherent  Descriptions of Associations:Intact  Orientation:Full (Time, Place and Person)  Thought Content:Paranoid Ideation  History of Schizophrenia/Schizoaffective disorder:Yes  Duration of Psychotic Symptoms:Greater than six months  Hallucinations:No data recorded Ideas of Reference:Paranoia  Suicidal Thoughts:No data recorded Homicidal Thoughts:No data recorded  Sensorium  Memory: Immediate Good; Recent  Good; Remote Good  Judgment: Fair  Insight: Fair   Chartered certified accountant: Fair  Attention Span: Fair  Recall: Jennelle Human of Knowledge: Fair  Language: Fair   Psychomotor Activity  Psychomotor Activity:No data recorded  Assets  Assets: Communication Skills; Desire for Improvement; Physical Health; Social Support   Sleep  Sleep:Good   Physical Exam: Physical Exam Vitals and nursing note reviewed.  HENT:     Nose: Nose normal.  Pulmonary:     Effort: Pulmonary effort is normal.  Musculoskeletal:        General: Normal range of motion.     Cervical back: Normal range of motion.  Skin:    General: Skin is warm and dry.  Neurological:     Mental Status: He is alert.    Review of Systems  Psychiatric/Behavioral:  Positive for depression, hallucinations, substance abuse and suicidal ideas. The patient is nervous/anxious.   All other systems reviewed and are negative.  Blood pressure 115/68, pulse 78, temperature 98.3 F (36.8 C), temperature source Oral, resp. rate 17, height 5\' 8"  (1.727 m), weight 83.9 kg, SpO2 98%. Body mass index is 28.12 kg/m.   Treatment Plan Summary:  Daily contact with patient to assess and evaluate symptoms and progress in treatment and Medication management. Patient's case to be discussed in multi-disciplinary team meeting.    Observation Level/Precautions:  Voluntary admission; 15 minute checks; Precautions: assault, suicide  Laboratory:   labs reviewed; lithium level, A1c, lipid panel on 12/23/23  Psychotherapy:  recommend individual and group therapy  Medications:  see below  Consultations:  social work for discharge planning  Discharge Concerns:  need to safety plan (access to father's firearms); medication compliance and effectiveness; substance use  Estimated LOS: 5-7 days  Discharge planning: patient seeking long-term substance use treatment program, possibly [facility] in [place]    - Continue lithium 300mg  PO BID for schizoaffective disorder bipolar type, started 12/18/23 - Continue hydroxyzine 25mg  PO tid prn anxiety, started 12/18/23 - Continue quetiapine 200mg  PO at bedtime for schizoaffective disorder bipolar type, started 12/18/23 - Continue quetiapine 50mg  PO TID PRN severe anxiety or hallucinations, started 1/16 - Continue gabapentin 300mg  PO TID for anxiety (off-label), started 1/16 - Will give one-time dose of olanzapine 5mg  for psychosis and agitation --  The risks/benefits/side-effects/alternatives to this medication were discussed in detail with the patient and time was given for questions. The patient consents to medication trial.              -- Metabolic profile and EKG monitoring obtained while on an atypical antipsychotic (QTc: 433)              -- Encouraged patient to participate in unit milieu and in scheduled group therapies     Long Term Goal(s): Improvement in symptoms so as ready for discharge   Short Term Goals: Ability to identify changes in lifestyle to reduce recurrence of condition will improve, Ability to verbalize feelings will improve, Ability to disclose and discuss suicidal ideas, Ability to demonstrate self-control will improve, Ability to identify and develop effective coping behaviors will improve, Compliance with prescribed medications will improve, and Ability to identify  triggers associated with substance abuse/mental health issues will improve  Nashon Erbes Robyne Peers, NP

## 2023-12-21 NOTE — Plan of Care (Signed)
°  Problem: Education: °Goal: Knowledge of [facility] Education information/materials will improve °Outcome: Not Progressing °Goal: Emotional status will improve °Outcome: Not Progressing °Goal: Mental status will improve °Outcome: Not Progressing °Goal: Verbalization of understanding the information provided will improve °Outcome: Not Progressing °  °Problem: Activity: °Goal: Interest or engagement in activities will improve °Outcome: Not Progressing °Goal: Sleeping patterns will improve °Outcome: Not Progressing °  °Problem: Coping: °Goal: Ability to verbalize frustrations and anger appropriately will improve °Outcome: Not Progressing °Goal: Ability to demonstrate self-control will improve °Outcome: Not Progressing °  °Problem: Health Behavior/Discharge Planning: °Goal: Identification of resources available to assist in meeting health care needs will improve °Outcome: Not Progressing °Goal: Compliance with treatment plan for underlying cause of condition will improve °Outcome: Not Progressing °  °Problem: Safety: °Goal: Periods of time without injury will increase °Outcome: Not Progressing °  °

## 2023-12-22 DIAGNOSIS — F250 Schizoaffective disorder, bipolar type: Secondary | ICD-10-CM | POA: Diagnosis not present

## 2023-12-22 NOTE — Plan of Care (Signed)
  Problem: Education: Goal: Emotional status will improve Outcome: Not Progressing   Problem: Activity: Goal: Interest or engagement in activities will improve Outcome: Not Progressing   Problem: Safety: Goal: Periods of time without injury will increase Outcome: Progressing

## 2023-12-22 NOTE — Group Note (Signed)
Date:  12/22/2023 Time:  9:39 PM  Group Topic/Focus:  Healthy Communication:   The focus of this group is to discuss communication, barriers to communication, as well as healthy ways to communicate with others.    Participation Level:  did not attend  Participation Quality:   none  Affect:   none  Cognitive:   none  Insight: None  Engagement in Group:   none  Modes of Intervention:   none  Additional Comments:     Malori Myers 12/22/2023, 9:39 PM

## 2023-12-22 NOTE — Plan of Care (Signed)
  Problem: Education: Goal: Knowledge of [facility] Education information/materials will improve Outcome: Progressing   Problem: Education: Goal: Emotional status will improve Outcome: Progressing   Problem: Activity: Goal: Interest or engagement in activities will improve Outcome: Progressing

## 2023-12-22 NOTE — Progress Notes (Signed)
Pt was calm and cooperative, compliant with medications.  Pt denies HI AVH.  Endorses passive SI.  Continued monitoring for safety.  12/22/23 1200  Psych Admission Type (Psych Patients Only)  Admission Status Voluntary  Psychosocial Assessment  Patient Complaints Sadness  Eye Contact Brief  Facial Expression Sad  Affect Anxious;Blunted  Speech Soft  Interaction Isolative  Motor Activity Slow  Appearance/Hygiene Unremarkable  Behavior Characteristics Cooperative;Appropriate to situation  Mood Pleasant  Thought Process  Coherency WDL  Content Blaming others;Paranoia  Delusions Paranoid  Perception WDL  Hallucination None reported or observed  Judgment WDL  Confusion None  Danger to Self  Current suicidal ideation? Passive  Agreement Not to Harm Self Yes  Description of Agreement verbal  Danger to Others  Danger to Others None reported or observed

## 2023-12-22 NOTE — Progress Notes (Signed)
 Marland Kitchen

## 2023-12-22 NOTE — Progress Notes (Signed)
Patient is isolative to self and room. Did not come out for group or snack. Minimal interaction with staff. None with peers. Requests no meds this evening. Sleeping most of shift. Passive SI, No intent.  Encouragement and support provided. Safety checks maintained. Medications given as prescribed. Pt receptive and remains safe on unit with q 15 min checks.

## 2023-12-22 NOTE — Progress Notes (Signed)
[facility] MD Progress Note  12/22/2023 1512 MURDOCH HEINLE  MRN:  [id number]  Subjective:  Jeremiah Newman is a 33 year old Caucasian male with a history of schizoaffective disorder bipolar type and PTSD who presented voluntarily with reports of suicidal ideation, command auditory hallucinations, intrusive thoughts, and paranoia.   Chart reviewed, case discussed with multidisciplinary team, patient seen on rounds.  1/17: patient received 1 time dose of olanzapine 5mg  and tolerated well 1/18: quetiapine discontinued d/t patient's plan for long-term substance use tx program; start olanzapine 10mg  PO BID for mood/psychosis Jeremiah Newman reports he is feeling a little better today. Reports voices have settled down and he feels less anxious. Persecutory delusions persist. He is compliant with medication and tolerating well. He denies thoughts of harming himself and others today. He has been working on substance use treatment options for discharge. Plans to call [facility] in [facility].  Principal Problem: Schizoaffective disorder, bipolar type (HCC) Diagnosis: Principal Problem:   Schizoaffective disorder, bipolar type (HCC) Active Problems:   Suicidal ideation   PTSD (post-traumatic stress disorder)   Insomnia   Paranoia (psychosis) (HCC)  Total Time spent with patient: 20 minutes  Past Psychiatric History: see h&p  Past Medical History: History reviewed. No pertinent past medical history. History reviewed. No pertinent surgical history. Family History: History reviewed. No pertinent family history. Family Psychiatric  History: see h&p Social History:  Social History   Substance and Sexual Activity  Alcohol Use Yes     Social History   Substance and Sexual Activity  Drug Use No    Social History   Socioeconomic History   Marital status: Single    Spouse name: Not on file   Number of children: Not on file   Years of education: Not on file   Highest education level: Not on file   Occupational History   Not on file  Tobacco Use   Smoking status: Some Days    Current packs/day: 1.00    Average packs/day: 1 pack/day for 9.0 years (9.0 ttl pk-yrs)    Types: Cigarettes    Start date: 2016   Smokeless tobacco: Never  Substance and Sexual Activity   Alcohol use: Yes   Drug use: No   Sexual activity: Not Currently  Other Topics Concern   Not on file  Social History Narrative   Not on file   Social Drivers of Health   Financial Resource Strain: Not on file  Food Insecurity: Food Insecurity Present (12/18/2023)   Hunger Vital Sign    Worried About Running Out of Food in the Last Year: Often true    Ran Out of Food in the Last Year: Often true  Transportation Needs: Unmet Transportation Needs (12/18/2023)   PRAPARE - Administrator, Civil Service (Medical): Yes    Lack of Transportation (Non-Medical): Yes  Physical Activity: Not on file  Stress: Not on file  Social Connections: Not on file   Additional Social History:                         Sleep: Good  Appetite:  Good  Current Medications: Current Facility-Administered Medications  Medication Dose Route Frequency Provider Last Rate Last Admin   acetaminophen (TYLENOL) tablet 650 mg  650 mg Oral Q6H PRN [name], [name], MD   650 mg at 12/20/23 2106   alum & mag hydroxide-simeth (MAALOX/MYLANTA) 200-200-20 MG/5ML suspension 30 mL  30 mL Oral Q4H PRN [name], MD  haloperidol (HALDOL) tablet 5 mg  5 mg Oral TID PRN [name], MD       And   diphenhydrAMINE (BENADRYL) capsule 50 mg  50 mg Oral TID PRN [name], [name], MD       haloperidol lactate (HALDOL) injection 5 mg  5 mg Intramuscular TID PRN [name], [name], MD       And   diphenhydrAMINE (BENADRYL) injection 50 mg  50 mg Intramuscular TID PRN [name], [name], MD       And   LORazepam (ATIVAN) injection 2 mg  2 mg Intramuscular TID PRN [name], [name], MD       haloperidol lactate  (HALDOL) injection 10 mg  10 mg Intramuscular TID PRN [name], [name], MD       And   diphenhydrAMINE (BENADRYL) injection 50 mg  50 mg Intramuscular TID PRN [name], [name], MD       And   LORazepam (ATIVAN) injection 2 mg  2 mg Intramuscular TID PRN [name], [name], MD       gabapentin (NEURONTIN) capsule 300 mg  300 mg Oral TID [name], NP   300 mg at 12/22/23 1704   hydrOXYzine (ATARAX) tablet 50 mg  50 mg Oral TID PRN [name], NP   50 mg at 12/22/23 1704   lithium carbonate capsule 300 mg  300 mg Oral BID WC [name], [name], MD   300 mg at 12/22/23 1704   magnesium hydroxide (MILK OF MAGNESIA) suspension 30 mL  30 mL Oral Daily PRN [name], [name], MD       nicotine (NICODERM CQ - dosed in mg/24 hours) patch 21 mg  21 mg Transdermal Daily [name], MD   21 mg at 12/22/23 0932   OLANZapine (ZYPREXA) tablet 10 mg  10 mg Oral BID [name], NP   10 mg at 12/22/23 1704    Lab Results: No results found for this or any previous visit (from the past 48 hours).  Blood Alcohol level:  Lab Results  Component Value Date   ETH <10 12/18/2023   ETH <10 10/08/2023    Metabolic Disorder Labs: Lab Results  Component Value Date   HGBA1C 4.9 10/10/2023   MPG 93.93 10/10/2023   No results found for: "PROLACTIN" Lab Results  Component Value Date   CHOL 214 (H) 10/10/2023   TRIG 135 10/10/2023   HDL 41 10/10/2023   CHOLHDL 5.2 10/10/2023   VLDL 27 10/10/2023   LDLCALC 146 (H) 10/10/2023    Physical Findings: AIMS:  , ,  ,  ,    CIWA:    COWS:     Musculoskeletal: Strength & Muscle Tone: within normal limits Gait & Station: normal Patient leans: N/A  Psychiatric Specialty Exam:  Presentation  General Appearance:  Appropriate for Environment  Eye Contact: Good  Speech: Clear and Coherent  Speech Volume: Normal  Handedness: Right   Mood and Affect  Mood: Anxious; Depressed; Hopeless  Affect: Flat   Thought  Process  Thought Processes: Coherent  Descriptions of Associations:Intact  Orientation:Full (Time, Place and Person)  Thought Content:Delusions; Logical; Paranoid Ideation; Perseveration  History of Schizophrenia/Schizoaffective disorder:Yes  Duration of Psychotic Symptoms:Greater than six months  Hallucinations:Hallucinations: Auditory; Visual Description of Auditory Hallucinations: multiple voices, telling him to kill himself and others Description of Visual Hallucinations: seeing shadows  Ideas of Reference:Paranoia; Percusatory  Suicidal Thoughts:Suicidal Thoughts: Yes, Passive SI Passive Intent and/or Plan: Without Intent; Without Plan  Homicidal Thoughts:Yes, intrusive thoughts without intent or plan  Sensorium  Memory: Immediate Good; Recent Good; Remote Good  Judgment: Fair  Insight: Good   Executive Functions  Concentration: Fair  Attention Span: Fair  Recall: Fair  Fund of Knowledge: Fair  Language: Fair   Psychomotor Activity  Psychomotor Activity:Psychomotor Activity: Increased; Restlessness   Assets  Assets: Manufacturing systems engineer; Desire for Improvement; Physical Health; Resilience   Sleep  Sleep:Good   Physical Exam: Physical Exam Vitals and nursing note reviewed.  HENT:     Nose: Nose normal.  Pulmonary:     Effort: Pulmonary effort is normal.  Musculoskeletal:        General: Normal range of motion.     Cervical back: Normal range of motion.  Skin:    General: Skin is warm and dry.  Neurological:     Mental Status: He is alert.    Review of Systems  Psychiatric/Behavioral:  Positive for depression, hallucinations and substance abuse. The patient is nervous/anxious.   All other systems reviewed and are negative.  Blood pressure 115/73, pulse 65, temperature 97.9 F (36.6 C), resp. rate 17, height 5\' 8"  (1.727 m), weight 83.9 kg, SpO2 99%. Body mass index is 28.12 kg/m.   Treatment Plan Summary:  Daily contact  with patient to assess and evaluate symptoms and progress in treatment and Medication management. Patient's case to be discussed in multi-disciplinary team meeting.   Observation Level/Precautions:  Voluntary admission; 15 minute checks; Precautions: assault, suicide  Laboratory:   labs reviewed; lithium level, A1c, lipid panel, TSH, RPR on 12/23/23  Psychotherapy:  recommend individual and group therapy  Medications:  see below  Consultations:  social work for discharge planning  Discharge Concerns:  need to safety plan; medication compliance and effectiveness; recovery resources  Estimated LOS: 5-7 days  Discharge planning: patient seeking long-term substance use treatment program, possibly [facility] in [place]    - Continue lithium 300mg  PO BID for schizoaffective disorder bipolar type, started 12/18/23 - Continue hydroxyzine 25mg  PO tid prn anxiety, started 12/18/23 - Discontinue quetiapine 12/21/23 - Continue gabapentin 300mg  PO TID for anxiety (off-label), started 12/21/23 - Start olanzapine 10mg  PO BID for schizoaffective disorder bipolar type, started 12/21/23 --  The risks/benefits/side-effects/alternatives to this medication were discussed in detail with the patient and time was given for questions. The patient consents to medication trial.              -- Metabolic profile and EKG monitoring obtained while on an atypical antipsychotic (QTc: 433)              -- Encouraged patient to participate in unit milieu and in scheduled group therapies     Long Term Goal(s): Improvement in symptoms so as ready for discharge   Short Term Goals: Ability to identify changes in lifestyle to reduce recurrence of condition will improve, Ability to verbalize feelings will improve, Ability to disclose and discuss suicidal ideas, Ability to demonstrate self-control will improve, Ability to identify and develop effective coping behaviors will improve, Compliance with prescribed medications will improve, and  Ability to identify triggers associated with substance abuse/mental health issues will improve  Anela Bensman Robyne Peers, NP

## 2023-12-22 NOTE — Plan of Care (Signed)
?  Problem: Health Behavior/Discharge Planning: ?Goal: Compliance with treatment plan for underlying cause of condition will improve ?Outcome: Progressing ?  ?

## 2023-12-22 NOTE — Progress Notes (Signed)
   12/21/23 2100  Psych Admission Type (Psych Patients Only)  Admission Status Voluntary  Psychosocial Assessment  Patient Complaints Anxiety;Irritability  Eye Contact Brief  Facial Expression Sad  Affect Anxious;Blunted  Speech Logical/coherent;Soft  Interaction Assertive;Isolative  Motor Activity Slow  Appearance/Hygiene Improved  Behavior Characteristics Cooperative;Appropriate to situation  Mood Pleasant  Thought Process  Coherency Circumstantial  Content Blaming others;Paranoia  Delusions Paranoid  Perception WDL  Hallucination None reported or observed  Judgment WDL  Confusion None  Danger to Self  Current suicidal ideation? Denies  Self-Injurious Behavior No self-injurious ideation or behavior indicators observed or expressed   Agreement Not to Harm Self Yes  Description of Agreement verbal  Danger to Others  Danger to Others None reported or observed   Patient alert and oriented x 4, affect is congruent with mood, he denies SI/HI/AVH, thoughts are organized and coherent, isolated most of the shift in his room. No distress noted, 15 minutes safety checks maintained.

## 2023-12-23 DIAGNOSIS — F250 Schizoaffective disorder, bipolar type: Principal | ICD-10-CM

## 2023-12-23 LAB — LIPID PANEL
Cholesterol: 215 mg/dL — ABNORMAL HIGH (ref 0–200)
HDL: 40 mg/dL — ABNORMAL LOW (ref >40)
LDL Cholesterol: 116 mg/dL — ABNORMAL HIGH (ref 0–99)
Total CHOL/HDL Ratio: 5.4 {ratio}
Triglycerides: 297 mg/dL — ABNORMAL HIGH (ref <150)
VLDL: 59 mg/dL — ABNORMAL HIGH (ref 0–40)

## 2023-12-23 LAB — LITHIUM LEVEL: Lithium Lvl: 0.14 mmol/L — ABNORMAL LOW (ref 0.60–1.20)

## 2023-12-23 LAB — HEMOGLOBIN A1C
Hgb A1c MFr Bld: 4.7 % — ABNORMAL LOW (ref 4.8–5.6)
Mean Plasma Glucose: 88.19 mg/dL

## 2023-12-23 LAB — TSH: TSH: 2.582 u[IU]/mL (ref 0.350–4.500)

## 2023-12-23 NOTE — Group Note (Signed)
Date:  12/23/2023 Time:  10:20 AM  Group Topic/Focus:  Goals Group:   The focus of this group is to help patients establish daily goals to achieve during treatment and discuss how the patient can incorporate goal setting into their daily lives to aide in recovery. Orientation:   The focus of this group is to educate the patient on the purpose and policies of crisis stabilization and provide a format to answer questions about their admission.  The group details unit policies and expectations of patients while admitted. Pop QUIZ: Laurey Morale    Participation Level:  Active  Participation Quality:  Appropriate and Attentive  Affect:  Appropriate  Cognitive:  Alert, Appropriate, and Oriented  Insight: Appropriate  Engagement in Group:  Developing/Improving and Engaged  Modes of Intervention:  Activity, Discussion, Education, and Socialization  Additional Comments:    Rosaura Carpenter 12/23/2023, 10:20 AM

## 2023-12-23 NOTE — Group Note (Signed)
Date:  12/23/2023 Time:  8:49 PM  Group Topic/Focus:  Wellness Toolbox:   The focus of this group is to discuss various aspects of wellness, balancing those aspects and exploring ways to increase the ability to experience wellness.  Patients will create a wellness toolbox for use upon discharge. Wrap-Up Group:   The focus of this group is to help patients review their daily goal of treatment and discuss progress on daily workbooks.    Participation Level:  Did Not Attend   Katina Dung 12/23/2023, 8:49 PM

## 2023-12-23 NOTE — Group Note (Signed)
Date:  12/23/2023 Time:  12:43 PM  Group Topic/Focus:  Rediscovering Joy:   The focus of this group is to explore various ways to relieve stress in a positive manner. Art group therapy    Participation Level:  Active  Participation Quality:  Appropriate and Attentive  Affect:  Appropriate  Cognitive:  Alert and Appropriate  Insight: Appropriate  Engagement in Group:  Developing/Improving and Engaged  Modes of Intervention:  Activity and Discussion  Additional Comments:    Rosaura Carpenter 12/23/2023, 12:43 PM

## 2023-12-23 NOTE — Progress Notes (Signed)
[facility] MD Progress Note  12/23/2023 7:19 PM BECK TESHIMA  MRN:  [id number]  Principal Problem: Schizoaffective disorder, bipolar type (HCC) Diagnosis: Principal Problem:   Schizoaffective disorder, bipolar type (HCC) Active Problems:   Suicidal ideation   PTSD (post-traumatic stress disorder)   Insomnia   Paranoia (psychosis) (HCC)  Reason for admission: Dajuan Elvin is a 33 year old Caucasian male with a history of schizoaffective disorder bipolar type and PTSD who presented voluntarily with reports of suicidal ideation, command auditory hallucinations, intrusive thoughts, and paranoia. States he has not slept in days. Reports worsening symptoms over the past two weeks in the context of medication non-adherence and recent substance use. UDS positive for amphetamine, cocaine, THC. Patient was released from prison in November 2024 and has had adjustment challenges. He has an extensive trauma history beginning in childhood. Currently living with parents. Seeking a structured treatment program.   Yesterday the psychiatry team made the following recommendations:   On assessment today, the pt reports that their mood is *** Reports that anxiety is *** Sleep is *** Appetite is *** Concentration is ***  Energy level is *** *** suicidal thoughts. *** suicidal intent and plan.  Denies having any HI.  Denies having psychotic symptoms.   Denies having side effects to current psychiatric medications.   We discussed changes to current medication regimen, including ***  Discussed the following psychosocial stressors: ***     Total Time spent with patient: 45 minutes  Past Psychiatric History: Multiple previous inpatient hospitalizations, most recent [facility] November 2024. Not established with outpatient. Multiple previous suicide attempts, most recent 1 year ago via hanging. Past trials of Remeron, Lexapro.   Past Medical History: History reviewed. No pertinent past medical history. History  reviewed. No pertinent surgical history. Family History: History reviewed. No pertinent family history. Family Psychiatric  History: See H&P Social History:  Social History   Substance and Sexual Activity  Alcohol Use Yes     Social History   Substance and Sexual Activity  Drug Use No    Social History   Socioeconomic History   Marital status: Single    Spouse name: Not on file   Number of children: Not on file   Years of education: Not on file   Highest education level: Not on file  Occupational History   Not on file  Tobacco Use   Smoking status: Some Days    Current packs/day: 1.00    Average packs/day: 1 pack/day for 9.1 years (9.1 ttl pk-yrs)    Types: Cigarettes    Start date: 2016   Smokeless tobacco: Never  Substance and Sexual Activity   Alcohol use: Yes   Drug use: No   Sexual activity: Not Currently  Other Topics Concern   Not on file  Social History Narrative   Not on file   Social Drivers of Health   Financial Resource Strain: Not on file  Food Insecurity: Food Insecurity Present (12/18/2023)   Hunger Vital Sign    Worried About Running Out of Food in the Last Year: Often true    Ran Out of Food in the Last Year: Often true  Transportation Needs: Unmet Transportation Needs (12/18/2023)   PRAPARE - Administrator, Civil Service (Medical): Yes    Lack of Transportation (Non-Medical): Yes  Physical Activity: Not on file  Stress: Not on file  Social Connections: Not on file   Additional Social History:    Sleep: Good  Appetite:  Good  Current Medications:  Current Facility-Administered Medications  Medication Dose Route Frequency Provider Last Rate Last Admin   acetaminophen (TYLENOL) tablet 650 mg  650 mg Oral Q6H PRN [name], [name], MD   650 mg at 12/20/23 2106   alum & mag hydroxide-simeth (MAALOX/MYLANTA) 200-200-20 MG/5ML suspension 30 mL  30 mL Oral Q4H PRN [name], [name], MD       haloperidol (HALDOL) tablet 5 mg  5 mg  Oral TID PRN [name], MD       And   diphenhydrAMINE (BENADRYL) capsule 50 mg  50 mg Oral TID PRN [name], [name], MD       haloperidol lactate (HALDOL) injection 5 mg  5 mg Intramuscular TID PRN [name], [name], MD       And   diphenhydrAMINE (BENADRYL) injection 50 mg  50 mg Intramuscular TID PRN [name], [name], MD       And   LORazepam (ATIVAN) injection 2 mg  2 mg Intramuscular TID PRN [name], [name], MD       haloperidol lactate (HALDOL) injection 10 mg  10 mg Intramuscular TID PRN [name], [name], MD       And   diphenhydrAMINE (BENADRYL) injection 50 mg  50 mg Intramuscular TID PRN [name], [name], MD       And   LORazepam (ATIVAN) injection 2 mg  2 mg Intramuscular TID PRN [name], [name], MD       gabapentin (NEURONTIN) capsule 300 mg  300 mg Oral TID [name], NP   300 mg at 12/23/23 1605   hydrOXYzine (ATARAX) tablet 50 mg  50 mg Oral TID PRN [name], NP   50 mg at 12/22/23 1704   lithium carbonate capsule 300 mg  300 mg Oral BID WC [name], [name], MD   300 mg at 12/23/23 1605   magnesium hydroxide (MILK OF MAGNESIA) suspension 30 mL  30 mL Oral Daily PRN [name], [name], MD       nicotine (NICODERM CQ - dosed in mg/24 hours) patch 21 mg  21 mg Transdermal Daily [name], MD   21 mg at 12/23/23 0857   OLANZapine (ZYPREXA) tablet 10 mg  10 mg Oral BID [name], NP   10 mg at 12/23/23 1605   Lab Results:  Results for orders placed or performed during the hospital encounter of 12/18/23 (from the past 48 hours)  Lithium level     Status: Abnormal   Collection Time: 12/23/23  7:59 AM  Result Value Ref Range   Lithium Lvl 0.14 (L) 0.60 - 1.20 mmol/L    Comment: Performed at [facility], [address] [place], [place] [address]  Hemoglobin A1c     Status: Abnormal   Collection Time: 12/23/23  7:59 AM  Result Value Ref Range   Hgb A1c MFr Bld 4.7 (L) 4.8 - 5.6 %    Comment: (NOTE) Pre  diabetes:          5.7%-6.4%  Diabetes:              >6.4%  Glycemic control for   <7.0% adults with diabetes    Mean Plasma Glucose 88.19 mg/dL    Comment: Performed at [facility], [address] [place], [place] [address]  Lipid panel     Status: Abnormal   Collection Time: 12/23/23  7:59 AM  Result Value Ref Range   Cholesterol 215 (H) 0 - 200 mg/dL   Triglycerides 440 (H) <150 mg/dL   HDL 40 (L) >10  mg/dL   Total CHOL/HDL Ratio 5.4 RATIO   VLDL 59 (H) 0 - 40 mg/dL   LDL Cholesterol 161 (H) 0 - 99 mg/dL    Comment:        Total Cholesterol/HDL:CHD Risk Coronary Heart Disease Risk Table                     Men   Women  1/2 Average Risk   3.4   3.3  Average Risk       5.0   4.4  2 X Average Risk   9.6   7.1  3 X Average Risk  23.4   11.0        Use the calculated Patient Ratio above and the CHD Risk Table to determine the patient's CHD Risk.        ATP III CLASSIFICATION (LDL):  <100     mg/dL   Optimal  096-045  mg/dL   Near or Above                    Optimal  130-159  mg/dL   Borderline  409-811  mg/dL   High  >914     mg/dL   Very High Performed at [facility], [address] [place], [place] [address]   TSH     Status: None   Collection Time: 12/23/23  7:59 AM  Result Value Ref Range   TSH 2.582 0.350 - 4.500 uIU/mL    Comment: Performed by a 3rd Generation assay with a functional sensitivity of <=0.01 uIU/mL. Performed at [facility], [address] [place], [place] [address]    Blood Alcohol level:  Lab Results  Component Value Date   [facility] <10 12/18/2023   ETH <10 10/08/2023   Metabolic Disorder Labs: Lab Results  Component Value Date   HGBA1C 4.7 (L) 12/23/2023   MPG 88.19 12/23/2023   MPG 93.93 10/10/2023   No results found for: "PROLACTIN" Lab Results  Component Value Date   CHOL 215 (H) 12/23/2023   TRIG 297 (H) 12/23/2023   HDL 40 (L) 12/23/2023   CHOLHDL 5.4 12/23/2023   VLDL 59 (H) 12/23/2023   LDLCALC  116 (H) 12/23/2023   LDLCALC 146 (H) 10/10/2023    Physical Findings: AIMS:  , ,  ,  ,    CIWA:    COWS:     Musculoskeletal: Strength & Muscle Tone: within normal limits Gait & Station: normal Patient leans: N/A  Psychiatric Specialty Exam:  Presentation  General Appearance:  Casual  Eye Contact: Good  Speech: Clear and Coherent  Speech Volume: Normal  Handedness: Right  Mood and Affect  Mood: Anxious; Depressed  Affect: Constricted  Thought Process  Thought Processes: Coherent  Descriptions of Associations:Intact  Orientation:Full (Time, Place and Person)  Thought Content:Paranoid Ideation  History of Schizophrenia/Schizoaffective disorder:Yes  Duration of Psychotic Symptoms:Greater than six months  Hallucinations:Hallucinations: Auditory Description of Auditory Hallucinations: Multiple voices, telling him negative stuff Description of Visual Hallucinations: Seeing lights and shadows  Ideas of Reference:Paranoia; Percusatory  Suicidal Thoughts:Suicidal Thoughts: Yes, Passive SI Passive Intent and/or Plan: Without Intent; Without Plan; Without Access to Means  Homicidal Thoughts:Homicidal Thoughts: No  Sensorium  Memory: Immediate Fair; Recent Fair  Judgment: Fair  Insight: Fair  Chartered certified accountant: Fair  Attention Span: Fair  Recall: [employer] of Knowledge: Fair  Language: Fair  Psychomotor Activity  Psychomotor Activity: Psychomotor Activity: Normal  Assets  Assets: Communication  Skills; Desire for Improvement; Physical Health; Resilience  Sleep  Sleep: Sleep: Good Number of Hours of Sleep: 8  Physical Exam: Physical Exam Vitals and nursing note reviewed.  HENT:     Head: Normocephalic.     Nose: Nose normal.     Mouth/Throat:     Mouth: Mucous membranes are moist.  Cardiovascular:     Rate and Rhythm: Normal rate.     Pulses: Normal pulses.  Pulmonary:     Effort: Pulmonary effort  is normal.  Abdominal:     Comments: Deferred  Genitourinary:    Comments: Deferred Musculoskeletal:     Cervical back: Normal range of motion.  Skin:    General: Skin is warm.  Neurological:     General: No focal deficit present.     Mental Status: He is alert and oriented to person, place, and time.  Psychiatric:        Mood and Affect: Mood normal.        Behavior: Behavior normal.    Review of Systems  Constitutional:  Negative for chills and fever.  HENT:  Negative for sore throat.   Eyes:  Negative for blurred vision.  Respiratory:  Negative for cough, sputum production, shortness of breath and wheezing.   Cardiovascular:  Negative for chest pain and palpitations.  Gastrointestinal:  Negative for abdominal pain, constipation, diarrhea, heartburn, nausea and vomiting.  Genitourinary:  Negative for dysuria and urgency.  Musculoskeletal:  Negative for falls.  Skin:  Negative for itching and rash.  Neurological:  Negative for dizziness, tingling and headaches.  Endo/Heme/Allergies:        See allergy listing  Psychiatric/Behavioral:  Positive for depression and hallucinations. The patient is nervous/anxious.    Blood pressure 103/60, pulse 77, temperature 98.4 F (36.9 C), resp. rate 17, height 5\' 8"  (1.727 m), weight 83.9 kg, SpO2 98%. Body mass index is 28.12 kg/m.   Treatment Plan Summary: Daily contact with patient to assess and evaluate symptoms and progress in treatment and Medication management Patient's case to be discussed in multi-disciplinary team meeting.    - Continue lithium 300mg  PO BID for schizoaffective disorder bipolar type, started 12/18/23 - Continue hydroxyzine 25mg  PO tid prn anxiety, started 12/18/23 -Discontinue quetiapine 200mg  PO at bedtime for schizoaffective disorder bipolar type, started 12/18/23 -Discontinued quetiapine 50mg  PO TID PRN severe anxiety or hallucinations -Continue gabapentin 300mg  PO TID for anxiety (off-label) -Continue  Zyprexa 10 mg p.o. twice daily for psychosis   --  The risks/benefits/side-effects/alternatives to this medication were discussed in detail with the patient and time was given for questions. The patient consents to medication trial.              -- Metabolic profile and EKG monitoring obtained while on an atypical antipsychotic (QTc: 433)              -- Encouraged patient to participate in unit milieu and in scheduled group therapies    Long Term Goal(s): Improvement in symptoms so as ready for discharge   Short Term Goals: Ability to identify changes in lifestyle to reduce recurrence of condition will improve, Ability to verbalize feelings will improve, Ability to disclose and discuss suicidal ideas, Ability to demonstrate self-control will improve, Ability to identify and develop effective coping behaviors will improve, Compliance with prescribed medications will improve, and Ability to identify triggers associated with substance abuse/mental health issues will improve   I certify that inpatient services furnished can reasonably be expected to improve the patient's  condition.    Cecilie Lowers, FNP 12/23/2023, 7:19 PM

## 2023-12-23 NOTE — Plan of Care (Signed)
Pt still endorses SI but remains hopeful   Problem: Education: Goal: Knowledge of [facility] Education information/materials will improve Outcome: Progressing Goal: Emotional status will improve Outcome: Progressing Goal: Mental status will improve Outcome: Progressing Goal: Verbalization of understanding the information provided will improve Outcome: Progressing   Problem: Activity: Goal: Interest or engagement in activities will improve Outcome: Progressing Goal: Sleeping patterns will improve Outcome: Progressing   Problem: Coping: Goal: Ability to verbalize frustrations and anger appropriately will improve Outcome: Progressing Goal: Ability to demonstrate self-control will improve Outcome: Progressing   Problem: Safety: Goal: Periods of time without injury will increase Outcome: Progressing   Problem: Health Behavior/Discharge Planning: Goal: Identification of resources available to assist in meeting health care needs will improve Outcome: Progressing Goal: Compliance with treatment plan for underlying cause of condition will improve Outcome: Progressing

## 2023-12-23 NOTE — Group Note (Signed)
LCSW Group Therapy Note   Group Date: 12/23/2023 Start Time: 1400 End Time: 1505   Type of Therapy and Topic:  Group Therapy: Challenging Core Beliefs  Participation Level:  Did Not Attend  Description of Group:  Patients were educated about core beliefs and asked to identify one harmful core belief that they have. Patients were asked to explore from where those beliefs originate. Patients were asked to discuss how those beliefs make them feel and the resulting behaviors of those beliefs. They were then be asked if those beliefs are true and, if so, what evidence they have to support them. Lastly, group members were challenged to replace those negative core beliefs with helpful beliefs.   Therapeutic Goals:   1. Patient will identify harmful core beliefs and explore the origins of such beliefs. 2. Patient will identify feelings and behaviors that result from those core beliefs. 3. Patient will discuss whether such beliefs are true. 4.  Patient will replace harmful core beliefs with helpful ones.  Summary of Patient Progress:  Patient did not attend.   Therapeutic Modalities: Cognitive Behavioral Therapy; Solution-Focused Therapy   Lowry Ram, LCSWA 12/23/2023  3:10 PM

## 2023-12-23 NOTE — Progress Notes (Signed)
   12/23/23 0900  Psych Admission Type (Psych Patients Only)  Admission Status Voluntary  Psychosocial Assessment  Patient Complaints Self-harm thoughts;Other (Comment) (pt states he felt hopeful today becase he read bible and listem to gospel music.still has thougts of self harm and hearing voices.)  Eye Contact Other (Comment) (WNL)  Facial Expression Sad  Affect Anxious  Speech Soft  Interaction Isolative  Motor Activity Slow  Appearance/Hygiene Unremarkable  Behavior Characteristics Cooperative  Mood Pleasant  Aggressive Behavior  Effect No apparent injury  Thought Process  Coherency WDL  Content WDL  Delusions WDL  Perception Hallucinations (pt states he hears voices)  Hallucination Auditory  Judgment WDL  Confusion WDL  Danger to Self  Current suicidal ideation? Passive  Agreement Not to Harm Self Yes  Description of Agreement verbal  Danger to Others  Danger to Others None reported or observed

## 2023-12-23 NOTE — Group Note (Addendum)
Recreation Therapy Group Note   Group Topic:Health and Wellness  Group Date: 12/23/2023 Start Time: 1000 End Time: 1050 Facilitators: Rosina Lowenstein, LRT, CTRS Location:  Craft Room  Activity Description/Intervention: Therapeutic Drumming. Patients with peers and staff were given the opportunity to engage in a leader facilitated [facility] Group Empowerment Drumming Circle with staff from the [employer], in partnership with [employer]. Teaching laboratory technician and trained [employer], Theodoro Doing leading with LRT observing and documenting intervention and pt response. This evidenced-based practice targets 7 areas of health and wellbeing in the human experience including: stress-reduction, exercise, self-expression, camaraderie/support, nurturing, spirituality, and music-making (leisure).   Goal Area(s) Addresses:  Patient will engage in pro-social way in music group.  Patient will follow directions of drum leader on the first prompt. Patient will demonstrate no behavioral issues during group.  Patient will identify if a reduction in stress level occurs as a result of participation in therapeutic drum circle.    Education: Leisure exposure, Pharmacologist, Musical expression, Discharge Planning   Clinical Observations/Individualized Feedback: Jeremiah Newman actively engaged in therapeutic drumming exercise and discussions. Pt was appropriate with peers, staff, and musical equipment for duration of programming.  Pt identified "good" as their feeling after participation in music-based programming. Pt affect congruent with verbalized emotion.    Plan: Continue to engage patient in RT group sessions 2-3x/week.   [address] LRT, CTRS 12/23/2023 1:58 PM

## 2023-12-24 DIAGNOSIS — F250 Schizoaffective disorder, bipolar type: Secondary | ICD-10-CM | POA: Diagnosis not present

## 2023-12-24 LAB — RPR: RPR Ser Ql: NONREACTIVE

## 2023-12-24 NOTE — Group Note (Signed)
Date:  12/24/2023 Time:  7:04 PM  Group Topic/Focus:  Rediscovering Joy:   The focus of this group is to explore various ways to relieve stress in a positive manner. Music Therapy    Participation Level:  Minimal  Participation Quality:  Appropriate  Affect:  Appropriate  Cognitive:  Appropriate  Insight: Limited  Engagement in Group:  Limited  Modes of Intervention:  Activity  Additional Comments:    Adanya Sosinski 12/24/2023, 7:04 PM

## 2023-12-24 NOTE — Progress Notes (Signed)
   12/24/23 0919  Psych Admission Type (Psych Patients Only)  Admission Status Voluntary  Psychosocial Assessment  Patient Complaints Depression  Eye Contact Brief  Facial Expression Sad  Affect Depressed  Speech Logical/coherent  Interaction Isolative  Motor Activity Other (Comment) (WDL)  Appearance/Hygiene Unremarkable  Behavior Characteristics Cooperative  Mood Anxious;Pleasant  Thought Process  Coherency WDL  Content WDL  Delusions None reported or observed  Perception WDL  Hallucination None reported or observed  Judgment Poor  Confusion None  Danger to Self  Current suicidal ideation? Passive  Agreement Not to Harm Self Yes  Description of Agreement Verbal  Danger to Others  Danger to Others None reported or observed

## 2023-12-24 NOTE — Progress Notes (Signed)
[facility] MD Progress Note  12/24/2023 8:10 PM NUNZIO OLAFSON  MRN:  [id number] Subjective:  *** Principal Problem: Schizoaffective disorder, bipolar type (HCC) Diagnosis: Principal Problem:   Schizoaffective disorder, bipolar type (HCC) Active Problems:   Suicidal ideation   PTSD (post-traumatic stress disorder)   Insomnia   Paranoia (psychosis) (HCC)  Total Time spent with patient: {Time; 15 min - 8 hours:17441}  Past Psychiatric History: ***  Past Medical History: History reviewed. No pertinent past medical history. History reviewed. No pertinent surgical history. Family History: History reviewed. No pertinent family history. Family Psychiatric  History: *** Social History:  Social History   Substance and Sexual Activity  Alcohol Use Yes     Social History   Substance and Sexual Activity  Drug Use No    Social History   Socioeconomic History   Marital status: Single    Spouse name: Not on file   Number of children: Not on file   Years of education: Not on file   Highest education level: Not on file  Occupational History   Not on file  Tobacco Use   Smoking status: Some Days    Current packs/day: 1.00    Average packs/day: 1 pack/day for 9.1 years (9.1 ttl pk-yrs)    Types: Cigarettes    Start date: 2016   Smokeless tobacco: Never  Substance and Sexual Activity   Alcohol use: Yes   Drug use: No   Sexual activity: Not Currently  Other Topics Concern   Not on file  Social History Narrative   Not on file   Social Drivers of Health   Financial Resource Strain: Not on file  Food Insecurity: Food Insecurity Present (12/18/2023)   Hunger Vital Sign    Worried About Running Out of Food in the Last Year: Often true    Ran Out of Food in the Last Year: Often true  Transportation Needs: Unmet Transportation Needs (12/18/2023)   PRAPARE - Administrator, Civil Service (Medical): Yes    Lack of Transportation (Non-Medical): Yes  Physical Activity: Not on  file  Stress: Not on file  Social Connections: Not on file   Additional Social History:                         Sleep: {[facility] GOOD/FAIR/POOR:22877}  Appetite:  {[facility] GOOD/FAIR/POOR:22877}  Current Medications: Current Facility-Administered Medications  Medication Dose Route Frequency Provider Last Rate Last Admin   acetaminophen (TYLENOL) tablet 650 mg  650 mg Oral Q6H PRN [name], [name], MD   650 mg at 12/20/23 2106   alum & mag hydroxide-simeth (MAALOX/MYLANTA) 200-200-20 MG/5ML suspension 30 mL  30 mL Oral Q4H PRN [name], [name], MD       haloperidol (HALDOL) tablet 5 mg  5 mg Oral TID PRN [name], [name], MD       And   diphenhydrAMINE (BENADRYL) capsule 50 mg  50 mg Oral TID PRN [name], [name], MD       haloperidol lactate (HALDOL) injection 5 mg  5 mg Intramuscular TID PRN [name], [name], MD       And   diphenhydrAMINE (BENADRYL) injection 50 mg  50 mg Intramuscular TID PRN [name], [name], MD       And   LORazepam (ATIVAN) injection 2 mg  2 mg Intramuscular TID PRN [name], [name], MD       haloperidol lactate (HALDOL) injection 10 mg  10 mg Intramuscular TID PRN [name], MD  And   diphenhydrAMINE (BENADRYL) injection 50 mg  50 mg Intramuscular TID PRN [name], [name], MD       And   LORazepam (ATIVAN) injection 2 mg  2 mg Intramuscular TID PRN [name], [name], MD       gabapentin (NEURONTIN) capsule 300 mg  300 mg Oral TID [name], NP   300 mg at 12/24/23 1702   hydrOXYzine (ATARAX) tablet 50 mg  50 mg Oral TID PRN [name], NP   50 mg at 12/22/23 1704   lithium carbonate capsule 300 mg  300 mg Oral BID WC [name], [name], MD   300 mg at 12/24/23 1702   magnesium hydroxide (MILK OF MAGNESIA) suspension 30 mL  30 mL Oral Daily PRN [name], [name], MD       nicotine (NICODERM CQ - dosed in mg/24 hours) patch 21 mg  21 mg Transdermal Daily [name], MD   21 mg at 12/24/23 0920   OLANZapine  (ZYPREXA) tablet 10 mg  10 mg Oral BID [name], NP   10 mg at 12/24/23 1702    Lab Results:  Results for orders placed or performed during the hospital encounter of 12/18/23 (from the past 48 hours)  Lithium level     Status: Abnormal   Collection Time: 12/23/23  7:59 AM  Result Value Ref Range   Lithium Lvl 0.14 (L) 0.60 - 1.20 mmol/L    Comment: Performed at [facility], [address] [place], [place] [address]  Hemoglobin A1c     Status: Abnormal   Collection Time: 12/23/23  7:59 AM  Result Value Ref Range   Hgb A1c MFr Bld 4.7 (L) 4.8 - 5.6 %    Comment: (NOTE) Pre diabetes:          5.7%-6.4%  Diabetes:              >6.4%  Glycemic control for   <7.0% adults with diabetes    Mean Plasma Glucose 88.19 mg/dL    Comment: Performed at [facility], [address], [place], [place] [address]  Lipid panel     Status: Abnormal   Collection Time: 12/23/23  7:59 AM  Result Value Ref Range   Cholesterol 215 (H) 0 - 200 mg/dL   Triglycerides 098 (H) <150 mg/dL   HDL 40 (L) >11 mg/dL   Total CHOL/HDL Ratio 5.4 RATIO   VLDL 59 (H) 0 - 40 mg/dL   LDL Cholesterol 914 (H) 0 - 99 mg/dL    Comment:        Total Cholesterol/HDL:CHD Risk Coronary Heart Disease Risk Table                     Men   Women  1/2 Average Risk   3.4   3.3  Average Risk       5.0   4.4  2 X Average Risk   9.6   7.1  3 X Average Risk  23.4   11.0        Use the calculated Patient Ratio above and the CHD Risk Table to determine the patient's CHD Risk.        ATP III CLASSIFICATION (LDL):  <100     mg/dL   Optimal  782-956  mg/dL   Near or Above                    Optimal  130-159  mg/dL  Borderline  160-189  mg/dL   High  >161     mg/dL   Very High Performed at [facility], [address] [place], [place] [address]   TSH     Status: None   Collection Time: 12/23/23  7:59 AM  Result Value Ref Range   TSH 2.582 0.350 - 4.500 uIU/mL    Comment: Performed by a  3rd Generation assay with a functional sensitivity of <=0.01 uIU/mL. Performed at [facility], [address] [place], [place] [address]   RPR     Status: None   Collection Time: 12/23/23  7:59 AM  Result Value Ref Range   RPR Ser Ql NON REACTIVE NON REACTIVE    Comment: Performed at [facility], [address], [place], [place] [address]    Blood Alcohol level:  Lab Results  Component Value Date   [facility] <10 12/18/2023   ETH <10 10/08/2023    Metabolic Disorder Labs: Lab Results  Component Value Date   HGBA1C 4.7 (L) 12/23/2023   MPG 88.19 12/23/2023   MPG 93.93 10/10/2023   No results found for: "PROLACTIN" Lab Results  Component Value Date   CHOL 215 (H) 12/23/2023   TRIG 297 (H) 12/23/2023   HDL 40 (L) 12/23/2023   CHOLHDL 5.4 12/23/2023   VLDL 59 (H) 12/23/2023   LDLCALC 116 (H) 12/23/2023   LDLCALC 146 (H) 10/10/2023    Physical Findings: AIMS:  , ,  ,  ,    CIWA:    COWS:     Musculoskeletal: Strength & Muscle Tone: {desc; muscle tone:32375} Gait & Station: {PE GAIT ED NWGN:56213} Patient leans: {Patient Leans:21022755}  Psychiatric Specialty Exam:  Presentation  General Appearance:  Casual  Eye Contact: Absent  Speech: Clear and Coherent  Speech Volume: Normal  Handedness: Right   Mood and Affect  Mood: Anxious; Depressed  Affect: Constricted   Thought Process  Thought Processes: Coherent  Descriptions of Associations:Intact  Orientation:Full (Time, Place and Person)  Thought Content:Paranoid Ideation  History of Schizophrenia/Schizoaffective disorder:Yes  Duration of Psychotic Symptoms:Greater than six months  Hallucinations:Hallucinations: Auditory Description of Auditory Hallucinations: Multiple voices, telling him negative stuff Description of Visual Hallucinations: Seeing lights and shadows  Ideas of Reference:Paranoia; Percusatory  Suicidal Thoughts:Suicidal Thoughts: Yes, Passive SI Passive  Intent and/or Plan: Without Intent; Without Plan; Without Access to Means  Homicidal Thoughts:Homicidal Thoughts: No   Sensorium  Memory: Immediate Fair; Recent Fair  Judgment: Fair  Insight: Fair   Chartered certified accountant: Fair  Attention Span: Fair  Recall: [employer] of Knowledge: Fair  Language: Fair   Psychomotor Activity  Psychomotor Activity: Psychomotor Activity: Normal   Assets  Assets: Manufacturing systems engineer; Desire for Improvement; Physical Health; Resilience   Sleep  Sleep: Sleep: Good Number of Hours of Sleep: 8    Physical Exam: Physical Exam ROS Blood pressure (!) 140/64, pulse 88, temperature 97.6 F (36.4 C), resp. rate 20, height 5\' 8"  (1.727 m), weight 83.9 kg, SpO2 98%. Body mass index is 28.12 kg/m.   Treatment Plan Summary: {CHL [facility] MD [place]. YQMV:784696295}  [name], NP 12/24/2023, 8:10 PM

## 2023-12-24 NOTE — Group Note (Signed)
LCSW Group Therapy Note  Group Date: 12/24/2023 Start Time: 1315 End Time: 1415   Type of Therapy and Topic:  Group Therapy: Cycles of Anxiety   Participation Level:  Did Not Attend   Description of Group:   In this group, patients learned how to recognize the physical, cognitive, emotional, and behavioral responses they have to anxiety-provoking situations.  They identified a recent time they became anxious and how they reacted.  Reviewed the cycle of anxiety.  They analyzed how their reaction was possibly beneficial and how it was possibly unhelpful.  The group discussed a variety of healthier coping skills that could help with such a situation in the future.  Focus was placed on how helpful it is to recognize the underlying emotions to our anxiety, because working on those can lead to a more permanent solution as well as our ability to focus on the important rather than the urgent.  Therapeutic Goals: Patients will remember their last incident of anxiety and how they felt emotionally and physically, what their thoughts were at the time, and how they behaved. Patients will identify how their behavior at that time worked for them, as well as how it worked against them. Patients will explore possible new behaviors to use in future anxious situations. Patients will learn that anxiety itself is normal and cannot be eliminated, and that healthier reactions can assist with resolving conflict rather than worsening situations.  Summary of Patient Progress:   Patient declined to attend group.  Therapeutic Modalities:   Cognitive Behavioral Therapy    Harden Mo, LCSW 12/24/2023  3:12 PM

## 2023-12-24 NOTE — Group Note (Signed)
Date:  12/24/2023 Time:  9:43 PM  Group Topic/Focus:  Making Healthy Choices:   The focus of this group is to help patients identify negative/unhealthy choices they were using prior to admission and identify positive/healthier coping strategies to replace them upon discharge. Managing Feelings:   The focus of this group is to identify what feelings patients have difficulty handling and develop a plan to handle them in a healthier way upon discharge. Orientation:   The focus of this group is to educate the patient on the purpose and policies of crisis stabilization and provide a format to answer questions about their admission.  The group details unit policies and expectations of patients while admitted. Rediscovering Joy:   The focus of this group is to explore various ways to relieve stress in a positive manner. Wrap-Up Group:   The focus of this group is to help patients review their daily goal of treatment and discuss progress on daily workbooks.    Participation Level:  Did Not Attend   Katina Dung 12/24/2023, 9:43 PM

## 2023-12-24 NOTE — Group Note (Signed)
Date:  12/24/2023 Time:  12:49 PM  Group Topic/Focus:  Goals Group:   The focus of this group is to help patients establish daily goals to achieve during treatment and discuss how the patient can incorporate goal setting into their daily lives to aide in recovery. Managing Feelings:   The focus of this group is to identify what feelings patients have difficulty handling and develop a plan to handle them in a healthier way upon discharge.    Participation Level:  Active  Participation Quality:  Appropriate  Affect:  Appropriate  Cognitive:  Alert and Appropriate  Insight: Appropriate  Engagement in Group:  Developing/Improving and Engaged  Modes of Intervention:  Activity, Discussion, and Education  Additional Comments:    Rosaura Carpenter 12/24/2023, 12:49 PM

## 2023-12-24 NOTE — Group Note (Signed)
Recreation Therapy Group Note   Group Topic:Problem Solving  Group Date: 12/24/2023 Start Time: 1000 End Time: 1050 Facilitators: Rosina Lowenstein, LRT, CTRS Location:  Craft Room  Group Description: Life Boat. Patients were given the scenario that they are on a boat that is about to become shipwrecked, leaving them stranded on an [place]. They are asked to make a list of 15 different items that they want to take with them when they are stranded on the [place]. Patients are asked to rank their items from most important to least important, #1 being the most important and #15 being the least. Patients will work individually for the first round to come up with 15 items and then pair up with a peer(s) to condense their list and come up with one list of 15 items between the two of them. Patients or LRT will read aloud the 15 different items to the group after each round. LRT facilitated post-activity processing to discuss how this activity can be used in daily life post discharge.   Goal Area(s) Addressed:  Patient will identify priorities, wants and needs. Patient will communicate with LRT and peers. Patient will work collectively as a Administrator, Civil Service. Patient will work on Product manager.    Affect/Mood: Appropriate   Participation Level: Active and Engaged   Participation Quality: Independent   Behavior: Appropriate, Calm, and Cooperative   Speech/Thought Process: Coherent   Insight: Good   Judgement: Good   Modes of Intervention: Activity, Exploration, Group work, Guided Discussion, Dentist, and Team-building   Patient Response to Interventions:  Attentive, Engaged, Interested , and Receptive   Education Outcome:  Acknowledges education   Clinical Observations/Individualized Feedback: Jeremiah Newman was active in their participation of session activities and group discussion. Pt identified "lighter, fishing rod, satellite phone, pots and pans" as things he will bring with him. Pt  asked good clarifying questions throughout. Pt spontaneously contributed to group discussion while interacting well with LRT and peers duration of session.    Plan: Continue to engage patient in RT group sessions 2-3x/week.   Rosina Lowenstein, LRT, CTRS 12/24/2023 1:04 PM

## 2023-12-24 NOTE — Plan of Care (Signed)
°  Problem: Education: °Goal: Emotional status will improve °Outcome: Progressing °Goal: Mental status will improve °Outcome: Progressing °Goal: Verbalization of understanding the information provided will improve °Outcome: Progressing °  °

## 2023-12-25 DIAGNOSIS — F250 Schizoaffective disorder, bipolar type: Secondary | ICD-10-CM | POA: Diagnosis not present

## 2023-12-25 NOTE — Plan of Care (Signed)
  Problem: Education: Goal: Knowledge of [facility] Education information/materials will improve Outcome: Progressing Goal: Emotional status will improve Outcome: Progressing   

## 2023-12-25 NOTE — [facility] Counselor (Signed)
CSW provided Patient with shoes.   Reymundo Poll, MSW, LCSWA 12/25/2023 12:44 PM

## 2023-12-25 NOTE — Progress Notes (Signed)
   12/25/23 0520  Psych Admission Type (Psych Patients Only)  Admission Status Voluntary  Psychosocial Assessment  Patient Complaints Depression  Eye Contact Brief  Facial Expression Sad  Affect Anxious  Speech Slow  Interaction Isolative  Motor Activity Slow  Appearance/Hygiene Unremarkable  Behavior Characteristics Cooperative  Mood Anxious  Aggressive Behavior  Effect No apparent injury  Thought Process  Coherency WDL  Content Paranoia  Delusions WDL  Perception WDL  Hallucination None reported or observed  Judgment WDL  Confusion WDL  Danger to Self  Current suicidal ideation? Passive  Agreement Not to Harm Self Yes  Danger to Others  Danger to Others None reported or observed

## 2023-12-25 NOTE — Group Note (Signed)
[facility] LCSW Group Therapy Note   Group Date: 12/25/2023 Start Time: 1300 End Time: 1400   Type of Therapy/Topic:  Group Therapy:  Emotion Regulation  Participation Level:  Did Not Attend    Description of Group:    The purpose of this group is to assist patients in learning to regulate negative emotions and experience positive emotions. Patients will be guided to discuss ways in which they have been vulnerable to their negative emotions. These vulnerabilities will be juxtaposed with experiences of positive emotions or situations, and patients challenged to use positive emotions to combat negative ones. Special emphasis will be placed on coping with negative emotions in conflict situations, and patients will process healthy conflict resolution skills.  Therapeutic Goals: Patient will identify two positive emotions or experiences to reflect on in order to balance out negative emotions:  Patient will label two or more emotions that they find the most difficult to experience:  Patient will be able to demonstrate positive conflict resolution skills through discussion or role plays:   Summary of Patient Progress: X   Therapeutic Modalities:   Cognitive Behavioral Therapy Feelings Identification Dialectical Behavioral Therapy   Glenis Smoker, LCSW

## 2023-12-25 NOTE — Plan of Care (Signed)
°  Problem: Education: °Goal: Emotional status will improve °Outcome: Progressing °Goal: Mental status will improve °Outcome: Progressing °Goal: Verbalization of understanding the information provided will improve °Outcome: Progressing °  °

## 2023-12-25 NOTE — [facility] Counselor (Signed)
Application faxed over to [facility], [employer]. [phone]) and CSW received transmission log that paperwork was sent.   Vilma Meckel. Algis Greenhouse, MSW, LCSW, LCAS 12/25/2023 3:14 PM

## 2023-12-25 NOTE — BH IP Treatment Plan (Signed)
Interdisciplinary Treatment and Diagnostic Plan Update  12/25/2023 Time of Session: 9:00AM Jeremiah Newman MRN: [id number]  Principal Diagnosis: Schizoaffective disorder, bipolar type [facility])  Secondary Diagnoses: Principal Problem:   Schizoaffective disorder, bipolar type (HCC) Active Problems:   Suicidal ideation   PTSD (post-traumatic stress disorder)   Insomnia   Paranoia (psychosis) (HCC)   Current Medications:  Current Facility-Administered Medications  Medication Dose Route Frequency Provider Last Rate Last Admin   acetaminophen (TYLENOL) tablet 650 mg  650 mg Oral Q6H PRN [name], MD   650 mg at 12/20/23 2106   alum & mag hydroxide-simeth (MAALOX/MYLANTA) 200-200-20 MG/5ML suspension 30 mL  30 mL Oral Q4H PRN [name], [name], MD       haloperidol (HALDOL) tablet 5 mg  5 mg Oral TID PRN [name], MD       And   diphenhydrAMINE (BENADRYL) capsule 50 mg  50 mg Oral TID PRN [name], [name], MD       haloperidol lactate (HALDOL) injection 5 mg  5 mg Intramuscular TID PRN [name], [name], MD       And   diphenhydrAMINE (BENADRYL) injection 50 mg  50 mg Intramuscular TID PRN [name], [name], MD       And   LORazepam (ATIVAN) injection 2 mg  2 mg Intramuscular TID PRN [name], [name], MD       haloperidol lactate (HALDOL) injection 10 mg  10 mg Intramuscular TID PRN [name], [name], MD       And   diphenhydrAMINE (BENADRYL) injection 50 mg  50 mg Intramuscular TID PRN [name], [name], MD       And   LORazepam (ATIVAN) injection 2 mg  2 mg Intramuscular TID PRN [name], [name], MD       gabapentin (NEURONTIN) capsule 300 mg  300 mg Oral TID [name], NP   300 mg at 12/25/23 0831   hydrOXYzine (ATARAX) tablet 50 mg  50 mg Oral TID PRN [name], NP   50 mg at 12/22/23 1704   lithium carbonate capsule 300 mg  300 mg Oral BID WC [name], [name], MD   300 mg at 12/25/23 0831   magnesium hydroxide (MILK OF MAGNESIA)  suspension 30 mL  30 mL Oral Daily PRN [name], [name], MD       nicotine (NICODERM CQ - dosed in mg/24 hours) patch 21 mg  21 mg Transdermal Daily [name], MD   21 mg at 12/25/23 0832   OLANZapine (ZYPREXA) tablet 10 mg  10 mg Oral BID [name], NP   10 mg at 12/25/23 0831   PTA Medications: Medications Prior to Admission  Medication Sig Dispense Refill Last Dose/Taking   escitalopram (LEXAPRO) 10 MG tablet Take 3 tablets (30 mg total) by mouth daily. 90 tablet 0    escitalopram (LEXAPRO) 20 MG tablet Take 1 tablet (20 mg total) by mouth daily. 30 tablet 1    escitalopram (LEXAPRO) 20 MG tablet Take 1 tablet (20 mg total) by mouth daily. 30 tablet 0    gabapentin (NEURONTIN) 300 MG capsule Take 1 capsule (300 mg total) by mouth 3 (three) times daily. 90 capsule 0    mirtazapine (REMERON) 7.5 MG tablet Take 1 tablet (7.5 mg total) by mouth at bedtime. 30 tablet 0    pantoprazole (PROTONIX) 40 MG tablet Take 1 tablet (40 mg total) by mouth daily. 30 tablet 0    QUEtiapine (SEROQUEL) 200 MG tablet Take 1 tablet (200 mg total) by mouth at  bedtime. (Patient not taking: Reported on 12/18/2023) 30 tablet 0    traZODone (DESYREL) 100 MG tablet Take 1 tablet (100 mg total) by mouth at bedtime as needed for sleep. 30 tablet 0     Patient Stressors:    Patient Strengths:    Treatment Modalities: Medication Management, Group therapy, Case management,  1 to 1 session with clinician, Psychoeducation, Recreational therapy.   Physician Treatment Plan for Primary Diagnosis: Schizoaffective disorder, bipolar type (HCC) Long Term Goal(s): Improvement in symptoms so as ready for discharge   Short Term Goals: Ability to identify changes in lifestyle to reduce recurrence of condition will improve Ability to verbalize feelings will improve Ability to disclose and discuss suicidal ideas Ability to demonstrate self-control will improve Ability to identify and develop effective coping  behaviors will improve Compliance with prescribed medications will improve Ability to identify triggers associated with substance abuse/mental health issues will improve  Medication Management: Evaluate patient's response, side effects, and tolerance of medication regimen.  Therapeutic Interventions: 1 to 1 sessions, Unit Group sessions and Medication administration.  Evaluation of Outcomes: Progressing  Physician Treatment Plan for Secondary Diagnosis: Principal Problem:   Schizoaffective disorder, bipolar type (HCC) Active Problems:   Suicidal ideation   PTSD (post-traumatic stress disorder)   Insomnia   Paranoia (psychosis) (HCC)  Long Term Goal(s): Improvement in symptoms so as ready for discharge   Short Term Goals: Ability to identify changes in lifestyle to reduce recurrence of condition will improve Ability to verbalize feelings will improve Ability to disclose and discuss suicidal ideas Ability to demonstrate self-control will improve Ability to identify and develop effective coping behaviors will improve Compliance with prescribed medications will improve Ability to identify triggers associated with substance abuse/mental health issues will improve     Medication Management: Evaluate patient's response, side effects, and tolerance of medication regimen.  Therapeutic Interventions: 1 to 1 sessions, Unit Group sessions and Medication administration.  Evaluation of Outcomes: Progressing   RN Treatment Plan for Primary Diagnosis: Schizoaffective disorder, bipolar type (HCC) Long Term Goal(s): Knowledge of disease and therapeutic regimen to maintain health will improve  Short Term Goals: Ability to remain free from injury will improve, Ability to verbalize frustration and anger appropriately will improve, Ability to demonstrate self-control, Ability to participate in decision making will improve, Ability to verbalize feelings will improve, Ability to disclose and discuss  suicidal ideas, Ability to identify and develop effective coping behaviors will improve, and Compliance with prescribed medications will improve   Medication Management: RN will administer medications as ordered by provider, will assess and evaluate patient's response and provide education to patient for prescribed medication. RN will report any adverse and/or side effects to prescribing provider.  Therapeutic Interventions: 1 on 1 counseling sessions, Psychoeducation, Medication administration, Evaluate responses to treatment, Monitor vital signs and CBGs as ordered, Perform/monitor CIWA, COWS, AIMS and Fall Risk screenings as ordered, Perform wound care treatments as ordered.  Evaluation of Outcomes: Progressing   LCSW Treatment Plan for Primary Diagnosis: Schizoaffective disorder, bipolar type (HCC) Long Term Goal(s): Safe transition to appropriate next level of care at discharge, Engage patient in therapeutic group addressing interpersonal concerns.  Short Term Goals: Engage patient in aftercare planning with referrals and resources, Increase social support, Increase ability to appropriately verbalize feelings, Increase emotional regulation, Facilitate acceptance of mental health diagnosis and concerns, Facilitate patient progression through stages of change regarding substance use diagnoses and concerns, Identify triggers associated with mental health/substance abuse issues, and Increase skills for wellness and  recovery   Therapeutic Interventions: Assess for all discharge needs, 1 to 1 time with Child psychotherapist, Explore available resources and support systems, Assess for adequacy in community support network, Educate family and significant other(s) on suicide prevention, Complete Psychosocial Assessment, Interpersonal group therapy.  Evaluation of Outcomes: Progressing   Progress in Treatment: Attending groups: Yes. and No. Participating in groups: Yes. Taking medication as prescribed:  Yes. Toleration medication: Yes. Family/Significant other contact made: No, will contact:  if given permission. 12/25/23 Update: No, will contact:  Patient declined.  SPE completed with pt, as pt refused to consent to family contact. SPI pamphlet provided to pt and pt was encouraged to share information with support network, ask questions, and talk about any concerns relating to SPE. Pt denies access to guns/firearms and verbalized understanding of information provided. Mobile Crisis information also provided to pt.   Patient understands diagnosis: Yes. Discussing patient identified problems/goals with staff: Yes. Medical problems stabilized or resolved: Yes. Denies suicidal/homicidal ideation: Yes. Issues/concerns per patient self-inventory: No. 12/25/23 Update: Patient disclosed initially needing to isolate out of fear of harming others. Patient however appears to be engaging more in treatment.  Other: none. 12/25/23 Update: None  New problem(s) identified: No, Describe:  none identified. 12/25/23 Update: None identified. Patient's initial fears of harming others have appeared to  have subsided as he is being more involved in treatment and not as isolated.   New Short Term/Long Term Goal(s):detox, elimination of symptoms of psychosis, medication management for mood stabilization; elimination of SI thoughts; development of comprehensive mental wellness/sobriety plan. 12/25/23 Update: detox, elimination of symptoms of psychosis, medication management for mood stabilization; elimination of SI thoughts; development of comprehensive mental wellness/sobriety plan.   Patient Goals:  "I need to be in a structured environment."  12/25/23 Update: Patient's goal to remain the same. Patient has also verbalized his desire to attend another inpatient treatment facility for substance use.   Discharge Plan or Barriers: CSW will assist pt with development of an appropriate aftercare/discharge plan. 12/25/23 Update:  CSW goal to remain the same.   Reason for Continuation of Hospitalization: Depression Hallucinations Medication stabilization Suicidal ideation 12/25/23 Update: Depression Hallucinations Medication stabilization Suicidal ideation  Estimated Length of Stay:1-7 days  12/25/23 Update: TBD   Last 3 [place] Suicide Severity Risk Score: Flowsheet Row Admission (Current) from 12/18/2023 in [facility] INPATIENT BEHAVIORAL MEDICINE Most recent reading at 12/18/2023  4:32 PM ED from 12/18/2023 in [facility] Department at [facility] Most recent reading at 12/18/2023 12:17 PM Admission (Discharged) from 10/09/2023 in [facility] INPATIENT BEHAVIORAL MEDICINE Most recent reading at 10/09/2023  4:00 AM  C-SSRS RISK CATEGORY High Risk High Risk Moderate Risk       Last PHQ 2/9 Scores:     No data to display          Scribe for Treatment Team: Lowry Ram, LCSW 12/25/2023 11:08 AM

## 2023-12-25 NOTE — Progress Notes (Signed)
[facility] MD Progress Note  12/25/2023 5:48 PM Jeremiah Newman  MRN:  [id number] Subjective:  33 year old Caucasian male with a history of schizoaffective disorder, bipolar type, and PTSD. He has an extensive trauma history beginning in childhood and currently resides with his parents. The patient is seeking a structured treatment program and is pending admission to a rehabilitation facility. the patient reports, "I am just getting on answers to my application."  Principal Problem: Schizoaffective disorder, bipolar type (HCC) Diagnosis: Principal Problem:   Schizoaffective disorder, bipolar type (HCC) Active Problems:   Suicidal ideation   PTSD (post-traumatic stress disorder)   Insomnia   Paranoia (psychosis) (HCC)  Total Time spent with patient: 1 hour  Past Psychiatric History: Bipolar  Past Medical History: History reviewed. No pertinent past medical history. History reviewed. No pertinent surgical history. Family History: History reviewed. No pertinent family history. Family Psychiatric  History: none reported Social History:  Social History   Substance and Sexual Activity  Alcohol Use Yes     Social History   Substance and Sexual Activity  Drug Use No    Social History   Socioeconomic History   Marital status: Single    Spouse name: Not on file   Number of children: Not on file   Years of education: Not on file   Highest education level: Not on file  Occupational History   Not on file  Tobacco Use   Smoking status: Some Days    Current packs/day: 1.00    Average packs/day: 1 pack/day for 9.1 years (9.1 ttl pk-yrs)    Types: Cigarettes    Start date: 2016   Smokeless tobacco: Never  Substance and Sexual Activity   Alcohol use: Yes   Drug use: No   Sexual activity: Not Currently  Other Topics Concern   Not on file  Social History Narrative   Not on file   Social Drivers of Health   Financial Resource Strain: Not on file  Food Insecurity: Food Insecurity  Present (12/18/2023)   Hunger Vital Sign    Worried About Running Out of Food in the Last Year: Often true    Ran Out of Food in the Last Year: Often true  Transportation Needs: Unmet Transportation Needs (12/18/2023)   PRAPARE - Administrator, Civil Service (Medical): Yes    Lack of Transportation (Non-Medical): Yes  Physical Activity: Not on file  Stress: Not on file  Social Connections: Not on file   Additional Social History:                         Sleep: Good  Appetite:  Good  Current Medications: Current Facility-Administered Medications  Medication Dose Route Frequency Provider Last Rate Last Admin   acetaminophen (TYLENOL) tablet 650 mg  650 mg Oral Q6H PRN [name], [name], MD   650 mg at 12/20/23 2106   alum & mag hydroxide-simeth (MAALOX/MYLANTA) 200-200-20 MG/5ML suspension 30 mL  30 mL Oral Q4H PRN [name], [name], MD       haloperidol (HALDOL) tablet 5 mg  5 mg Oral TID PRN [name], [name], MD       And   diphenhydrAMINE (BENADRYL) capsule 50 mg  50 mg Oral TID PRN [name], [name], MD       haloperidol lactate (HALDOL) injection 5 mg  5 mg Intramuscular TID PRN [name], [name], MD       And   diphenhydrAMINE (BENADRYL) injection 50 mg  50 mg Intramuscular  TID PRN [name], MD       And   LORazepam (ATIVAN) injection 2 mg  2 mg Intramuscular TID PRN [name], [name], MD       haloperidol lactate (HALDOL) injection 10 mg  10 mg Intramuscular TID PRN [name], [name], MD       And   diphenhydrAMINE (BENADRYL) injection 50 mg  50 mg Intramuscular TID PRN [name], [name], MD       And   LORazepam (ATIVAN) injection 2 mg  2 mg Intramuscular TID PRN [name], [name], MD       gabapentin (NEURONTIN) capsule 300 mg  300 mg Oral TID [name], NP   300 mg at 12/25/23 1630   hydrOXYzine (ATARAX) tablet 50 mg  50 mg Oral TID PRN [name], NP   50 mg at 12/22/23 1704   lithium carbonate capsule 300 mg   300 mg Oral BID WC [name], [name], MD   300 mg at 12/25/23 1630   magnesium hydroxide (MILK OF MAGNESIA) suspension 30 mL  30 mL Oral Daily PRN [name], [name], MD       nicotine (NICODERM CQ - dosed in mg/24 hours) patch 21 mg  21 mg Transdermal Daily [name], MD   21 mg at 12/25/23 0832   OLANZapine (ZYPREXA) tablet 10 mg  10 mg Oral BID [name], NP   10 mg at 12/25/23 1630    Lab Results: No results found for this or any previous visit (from the past 48 hours).  Blood Alcohol level:  Lab Results  Component Value Date   ETH <10 12/18/2023   ETH <10 10/08/2023    Metabolic Disorder Labs: Lab Results  Component Value Date   HGBA1C 4.7 (L) 12/23/2023   MPG 88.19 12/23/2023   MPG 93.93 10/10/2023   No results found for: "PROLACTIN" Lab Results  Component Value Date   CHOL 215 (H) 12/23/2023   TRIG 297 (H) 12/23/2023   HDL 40 (L) 12/23/2023   CHOLHDL 5.4 12/23/2023   VLDL 59 (H) 12/23/2023   LDLCALC 116 (H) 12/23/2023   LDLCALC 146 (H) 10/10/2023    Physical Findings: AIMS:  , ,  ,  ,    CIWA:    COWS:     Musculoskeletal: Strength & Muscle Tone: within normal limits Gait & Station: normal Patient leans: N/A  Psychiatric Specialty Exam:  Presentation  General Appearance:  Appropriate for Environment; Disheveled  Eye Contact: Good  Speech: Clear and Coherent; Normal Rate  Speech Volume: Normal  Handedness: Right   Mood and Affect  Mood: Euthymic  Affect: Congruent; Flat   Thought Process  Thought Processes: Linear; Goal Directed  Descriptions of Associations:Intact  Orientation:Full (Time, Place and Person)  Thought Content:WDL  History of Schizophrenia/Schizoaffective disorder:Yes  Duration of Psychotic Symptoms:Greater than six months  Hallucinations:Hallucinations: None Description of Auditory Hallucinations: denies Description of Visual Hallucinations: denies  Ideas of Reference:None  Suicidal  Thoughts:Suicidal Thoughts: No SI Passive Intent and/or Plan: -- (denies)  Homicidal Thoughts:Homicidal Thoughts: No   Sensorium  Memory: Immediate Good; Remote Good  Judgment: Fair (evidenced by appropriate interactions and adherence to the treatment plan)  Insight: Fair (acknowledges the need for treatment and is engaged in the rehabilitation process.)   Executive Functions  Concentration: Fair  Attention Span: Fair  Recall: Fair  Fund of Knowledge: Good  Language: Fair   Psychomotor Activity  Psychomotor Activity: Psychomotor Activity: Normal   Assets  Assets: Communication Skills; Desire for Improvement  Sleep  Sleep: Sleep: Good Number of Hours of Sleep: 7    Physical Exam: Physical Exam Vitals and nursing note reviewed.  Constitutional:      Appearance: Normal appearance.  HENT:     Head: Normocephalic and atraumatic.     Nose: Nose normal.  Pulmonary:     Effort: Pulmonary effort is normal.  Musculoskeletal:        General: Normal range of motion.     Cervical back: Normal range of motion.  Neurological:     General: No focal deficit present.     Mental Status: He is alert and oriented to person, place, and time. Mental status is at baseline.  Psychiatric:        Attention and Perception: Attention and perception normal.        Mood and Affect: Mood is anxious. Affect is flat.        Speech: Speech normal.        Behavior: Behavior normal. Behavior is cooperative.        Thought Content: Thought content is paranoid.        Cognition and Memory: Cognition and memory normal.        Judgment: Judgment is impulsive.    Review of Systems  Psychiatric/Behavioral:  The patient is nervous/anxious.   All other systems reviewed and are negative.  Blood pressure (!) 143/83, pulse 87, temperature (!) 97.5 F (36.4 C), resp. rate 19, height 5\' 8"  (1.727 m), weight 83.9 kg, SpO2 97%. Body mass index is 28.12 kg/m.   Treatment Plan  Summary: Daily contact with patient to assess and evaluate symptoms and progress in treatment and Medication management Lithium 300 mg PO BID: Continue for mood stabilization; monitor lithium levels and renal function as clinically indicated. Hydroxyzine 25 mg PO TID PRN: Continue for anxiety. Gabapentin 300 mg PO TID: Continue for off-label anxiety management. Zyprexa (Olanzapine) 10 mg PO BID: Continue for psychosis Facilitate pending admission to the structured rehabilitation program and ensure a smooth transition Reinforce the importance of medication adherence and therapy participation for long-term stabilization. [name], NP 12/25/2023, 5:48 PM

## 2023-12-25 NOTE — Group Note (Signed)
Date:  12/25/2023 Time:  10:34 AM  Group Topic/Focus:  Conflict Resolution:   The focus of this group is to discuss the conflict resolution process and how it may be used upon discharge. Goals Group:   The focus of this group is to help patients establish daily goals to achieve during treatment and discuss how the patient can incorporate goal setting into their daily lives to aide in recovery.    Participation Level:  Did Not Attend   Rosaura Carpenter 12/25/2023, 10:34 AM

## 2023-12-25 NOTE — Group Note (Signed)
Date:  12/25/2023 Time:  5:03 PM  Group Topic/Focus:  Healthy Communication:   The focus of this group is to discuss communication, barriers to communication, as well as healthy ways to communicate with others. Movie Therapy    Participation Level:  Active  Participation Quality:  Appropriate  Affect:  Appropriate  Cognitive:  Appropriate  Insight: Appropriate  Engagement in Group:  Developing/Improving  Modes of Intervention:  Activity  Additional Comments:    Leighanna Kirn 12/25/2023, 5:03 PM

## 2023-12-25 NOTE — Group Note (Signed)
Date:  12/25/2023 Time:  9:18 PM  Group Topic/Focus:  Self Care:   The focus of this group is to help patients understand the importance of self-care in order to improve or restore emotional, physical, spiritual, interpersonal, and financial health.    Participation Level:  Did Not Attend  Participation Quality:   none  Affect:   none  Cognitive:   none  Insight: None  Engagement in Group:   none  Modes of Intervention:   none  Additional Comments:  none  [name] 12/25/2023, 9:18 PM

## 2023-12-26 DIAGNOSIS — F250 Schizoaffective disorder, bipolar type: Secondary | ICD-10-CM | POA: Diagnosis not present

## 2023-12-26 MED ORDER — OLANZAPINE 5 MG PO TABS
15.0000 mg | ORAL_TABLET | Freq: Two times a day (BID) | ORAL | Status: DC
  Administered 2023-12-27 – 2024-01-01 (×12): 15 mg via ORAL
  Filled 2023-12-26 (×12): qty 1

## 2023-12-26 MED ORDER — MELATONIN 5 MG PO TABS
5.0000 mg | ORAL_TABLET | Freq: Every day | ORAL | Status: DC
  Administered 2023-12-29: 5 mg via ORAL
  Filled 2023-12-26 (×3): qty 1

## 2023-12-26 NOTE — Plan of Care (Signed)
  Problem: Education: Goal: Knowledge of [place] [facility] Education information/materials will improve Outcome: Not Progressing Goal: Emotional status will improve Outcome: Not Progressing Goal: Mental status will improve Outcome: Not Progressing Goal: Verbalization of understanding the information provided will improve Outcome: Not Progressing   Problem: Activity: Goal: Interest or engagement in activities will improve Outcome: Not Progressing Goal: Sleeping patterns will improve Outcome: Not Progressing   Problem: Coping: Goal: Ability to verbalize frustrations and anger appropriately will improve Outcome: Not Progressing Goal: Ability to demonstrate self-control will improve Outcome: Not Progressing   Problem: Health Behavior/Discharge Planning: Goal: Identification of resources available to assist in meeting health care needs will improve Outcome: Not Progressing Goal: Compliance with treatment plan for underlying cause of condition will improve Outcome: Not Progressing   Problem: Safety: Goal: Periods of time without injury will increase Outcome: Not Progressing   Problem: Education: Goal: Knowledge of General Education information will improve Description: Including pain rating scale, medication(s)/side effects and non-pharmacologic comfort measures Outcome: Not Progressing   Problem: Health Behavior/Discharge Planning: Goal: Ability to manage health-related needs will improve Outcome: Not Progressing   Problem: Clinical Measurements: Goal: Ability to maintain clinical measurements within normal limits will improve Outcome: Not Progressing Goal: Will remain free from infection Outcome: Not Progressing Goal: Diagnostic test results will improve Outcome: Not Progressing Goal: Respiratory complications will improve Outcome: Not Progressing Goal: Cardiovascular complication will be avoided Outcome: Not Progressing   Problem: Activity: Goal: Risk for activity  intolerance will decrease Outcome: Not Progressing   Problem: Nutrition: Goal: Adequate nutrition will be maintained Outcome: Not Progressing   Problem: Coping: Goal: Level of anxiety will decrease Outcome: Not Progressing   Problem: Elimination: Goal: Will not experience complications related to bowel motility Outcome: Not Progressing Goal: Will not experience complications related to urinary retention Outcome: Not Progressing   Problem: Pain Managment: Goal: General experience of comfort will improve and/or be controlled Outcome: Not Progressing   Problem: Safety: Goal: Ability to remain free from injury will improve Outcome: Not Progressing   Problem: Skin Integrity: Goal: Risk for impaired skin integrity will decrease Outcome: Not Progressing

## 2023-12-26 NOTE — Plan of Care (Signed)
  Problem: Education: Goal: Knowledge of [facility] Education information/materials will improve Outcome: Progressing Goal: Emotional status will improve Outcome: Progressing   

## 2023-12-26 NOTE — Group Note (Signed)
LCSW Group Therapy Note  Group Date: 12/26/2023 Start Time: 1300 End Time: 1400   Type of Therapy and Topic:  Group Therapy - Healthy vs Unhealthy Coping Skills  Participation Level:  Did Not Attend   Description of Group The focus of this group was to determine what unhealthy coping techniques typically are used by group members and what healthy coping techniques would be helpful in coping with various problems. Patients were guided in becoming aware of the differences between healthy and unhealthy coping techniques. Patients were asked to identify 2-3 healthy coping skills they would like to learn to use more effectively.  Therapeutic Goals Patients learned that coping is what human beings do all day long to deal with various situations in their lives Patients defined and discussed healthy vs unhealthy coping techniques Patients identified their preferred coping techniques and identified whether these were healthy or unhealthy Patients determined 2-3 healthy coping skills they would like to become more familiar with and use more often. Patients provided support and ideas to each other   Summary of Patient Progress:  Patient did not attend group.    Therapeutic Modalities Cognitive Behavioral Therapy Motivational Interviewing  Perrin Smack 12/26/2023  1:52 PM

## 2023-12-26 NOTE — Group Note (Signed)
Date:  12/26/2023 Time:  8:40 PM  Group Topic/Focus:  Wellness Toolbox:   The focus of this group is to discuss various aspects of wellness, balancing those aspects and exploring ways to increase the ability to experience wellness.  Patients will create a wellness toolbox for use upon discharge.    Participation Level:  Did Not Attend  Participation Quality:   none  Affect:   none  Cognitive:   none  Insight: None  Engagement in Group:   none  Modes of Intervention:   none  Additional Comments:  none  [name] 12/26/2023, 8:40 PM
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
## 2023-12-26 NOTE — Progress Notes (Signed)
Patient SI.HI and AVH. Patient stayed in the room most of the shift. Interacted minimally.  12/26/23 0437  Psych Admission Type (Psych Patients Only)  Admission Status Voluntary  Psychosocial Assessment  Patient Complaints Anxiety;Depression  Eye Contact Brief  Facial Expression Sad  Affect Anxious  Speech Slow  Interaction Isolative  Motor Activity Slow  Appearance/Hygiene Unremarkable  Behavior Characteristics Cooperative  Mood Anxious;Depressed  Aggressive Behavior  Effect No apparent injury  Thought Process  Coherency WDL  Content Paranoia  Delusions WDL  Perception WDL  Hallucination None reported or observed  Judgment WDL  Confusion WDL  Danger to Self  Current suicidal ideation? Passive  Agreement Not to Harm Self Yes  Danger to Others  Danger to Others None reported or observed

## 2023-12-26 NOTE — Progress Notes (Addendum)
   12/26/23 0900  Psych Admission Type (Psych Patients Only)  Admission Status Voluntary  Psychosocial Assessment  Patient Complaints Anxiety;Depression;Self-harm thoughts;Other (Comment) (auditory hallucinations, "whispers". Patient doesn't feel like the meds are working; patient also states that his depression "takes a while to kick in".)  Eye Contact Fair  Facial Expression Worried;Anxious  Affect Anxious;Preoccupied (patient states that he gets "in my mind and start thinking about my situation".)  Speech Logical/coherent  Interaction Assertive;Isolative (isolative to room, except for meals and medication. Patient states that he can't be around a lot of people "because my anxiety gets really bad".)  Motor Activity Slow  Appearance/Hygiene Unremarkable  Behavior Characteristics Cooperative;Appropriate to situation  Mood Preoccupied;Pleasant (patient started the morning out by saying that he is "blessed and highly favored".)  Aggressive Behavior  Effect No apparent injury  Thought Process  Coherency WDL  Content Preoccupation  Delusions None reported or observed  Perception Hallucinations  Hallucination Auditory (per patient the voices are "whispers".)  Judgment WDL  Confusion None  Danger to Self  Current suicidal ideation? Passive (patient states that "prison really messed me up".)  Self-Injurious Behavior Some self-injurious ideation observed or expressed.  No lethal plan expressed   Agreement Not to Harm Self Yes  Description of Agreement Verbal  Danger to Others  Danger to Others None reported or observed   Patient also stated to this writer that he sleeps well, "it relaxes me when I'm in a stable environment".

## 2023-12-26 NOTE — Group Note (Signed)
Date:  12/26/2023 Time:  10:27 AM  Group Topic/Focus:  Goals Group:   The focus of this group is to help patients establish daily goals to achieve during treatment and discuss how the patient can incorporate goal setting into their daily lives to aide in recovery.    Participation Level:  Minimal  Participation Quality:  Appropriate  Affect:  Appropriate  Cognitive:  Appropriate  Insight: Appropriate  Engagement in Group:  Engaged  Modes of Intervention:  Discussion, Education, and Support  Additional Comments:    Wilford Corner 12/26/2023, 10:27 AM
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
## 2023-12-26 NOTE — Progress Notes (Signed)
[facility] MD Progress Note  12/26/2023 8:01 PM Jeremiah Newman  MRN:  [id number] Subjective:  33 year old Caucasian male who presents with complaints of anxiety, depression, and passive self-harm thoughts. He reports experiencing auditory hallucinations described as "whispers" and feels that his current medications are not effective. The patient states, "My depression takes a while to kick in," and describes preoccupying thoughts about his situation, stating, "I get in my mind and start thinking about my situation." He reports that he avoids being around people because "my anxiety gets really bad." Despite these challenges, he also expressed a sense of hope, stating, "I am blessed and highly favored." The patient denies a lethal plan but acknowledges passive suicidal ideation and self-injurious thoughts. Principal Problem: Schizoaffective disorder, bipolar type (HCC) Diagnosis: Principal Problem:   Schizoaffective disorder, bipolar type (HCC) Active Problems:   Suicidal ideation   PTSD (post-traumatic stress disorder)   Insomnia   Paranoia (psychosis) (HCC)  Total Time spent with patient: 45 minutes  Past Psychiatric History:   Past Medical History: History reviewed. No pertinent past medical history. History reviewed. No pertinent surgical history. Family History: History reviewed. No pertinent family history. Family Psychiatric  History: none reported Social History:  Social History   Substance and Sexual Activity  Alcohol Use Yes     Social History   Substance and Sexual Activity  Drug Use No    Social History   Socioeconomic History   Marital status: Single    Spouse name: Not on file   Number of children: Not on file   Years of education: Not on file   Highest education level: Not on file  Occupational History   Not on file  Tobacco Use   Smoking status: Some Days    Current packs/day: 1.00    Average packs/day: 1 pack/day for 9.1 years (9.1 ttl pk-yrs)    Types:  Cigarettes    Start date: 2016   Smokeless tobacco: Never  Substance and Sexual Activity   Alcohol use: Yes   Drug use: No   Sexual activity: Not Currently  Other Topics Concern   Not on file  Social History Narrative   Not on file   Social Drivers of Health   Financial Resource Strain: Not on file  Food Insecurity: Food Insecurity Present (12/18/2023)   Hunger Vital Sign    Worried About Running Out of Food in the Last Year: Often true    Ran Out of Food in the Last Year: Often true  Transportation Needs: Unmet Transportation Needs (12/18/2023)   PRAPARE - Administrator, Civil Service (Medical): Yes    Lack of Transportation (Non-Medical): Yes  Physical Activity: Not on file  Stress: Not on file  Social Connections: Not on file   Additional Social History:                         Sleep: Fair  Appetite:  Good  Current Medications: Current Facility-Administered Medications  Medication Dose Route Frequency Provider Last Rate Last Admin   acetaminophen (TYLENOL) tablet 650 mg  650 mg Oral Q6H PRN [name], [name], MD   650 mg at 12/20/23 2106   alum & mag hydroxide-simeth (MAALOX/MYLANTA) 200-200-20 MG/5ML suspension 30 mL  30 mL Oral Q4H PRN [name], Nathan, MD       haloperidol (HALDOL) tablet 5 mg  5 mg Oral TID PRN [name], [name], MD       And   diphenhydrAMINE (BENADRYL) capsule 50  mg  50 mg Oral TID PRN [name], MD       haloperidol lactate (HALDOL) injection 5 mg  5 mg Intramuscular TID PRN [name], [name], MD       And   diphenhydrAMINE (BENADRYL) injection 50 mg  50 mg Intramuscular TID PRN [name], [name], MD       And   LORazepam (ATIVAN) injection 2 mg  2 mg Intramuscular TID PRN [name], [name], MD       haloperidol lactate (HALDOL) injection 10 mg  10 mg Intramuscular TID PRN [name], [name], MD       And   diphenhydrAMINE (BENADRYL) injection 50 mg  50 mg Intramuscular TID PRN [name], [name], MD        And   LORazepam (ATIVAN) injection 2 mg  2 mg Intramuscular TID PRN [name], [name], MD       gabapentin (NEURONTIN) capsule 300 mg  300 mg Oral TID [name], NP   300 mg at 12/26/23 1627   hydrOXYzine (ATARAX) tablet 50 mg  50 mg Oral TID PRN [name], NP   50 mg at 12/22/23 1704   lithium carbonate capsule 300 mg  300 mg Oral BID WC [name], [name], MD   300 mg at 12/26/23 1627   magnesium hydroxide (MILK OF MAGNESIA) suspension 30 mL  30 mL Oral Daily PRN [name], [name], MD       melatonin tablet 5 mg  5 mg Oral QHS [name], NP       nicotine (NICODERM CQ - dosed in mg/24 hours) patch 21 mg  21 mg Transdermal Daily [name], MD   21 mg at 12/26/23 0835   [START ON 12/27/2023] OLANZapine (ZYPREXA) tablet 15 mg  15 mg Oral BID [name], NP        Lab Results: No results found for this or any previous visit (from the past 48 hours).  Blood Alcohol level:  Lab Results  Component Value Date   ETH <10 12/18/2023   ETH <10 10/08/2023    Metabolic Disorder Labs: Lab Results  Component Value Date   HGBA1C 4.7 (L) 12/23/2023   MPG 88.19 12/23/2023   MPG 93.93 10/10/2023   No results found for: "PROLACTIN" Lab Results  Component Value Date   CHOL 215 (H) 12/23/2023   TRIG 297 (H) 12/23/2023   HDL 40 (L) 12/23/2023   CHOLHDL 5.4 12/23/2023   VLDL 59 (H) 12/23/2023   LDLCALC 116 (H) 12/23/2023   LDLCALC 146 (H) 10/10/2023    Physical Findings: AIMS:  , ,  ,  ,    CIWA:    COWS:     Musculoskeletal: Strength & Muscle Tone: within normal limits Gait & Station: normal Patient leans: N/A  Psychiatric Specialty Exam:  Presentation  General Appearance:  Disheveled  Eye Contact: Minimal  Speech: Clear and Coherent; Normal Rate  Speech Volume: Normal  Handedness: Right   Mood and Affect  Mood: Anxious (Preoccupied but pleasant.)  Affect: Labile; Congruent   Thought Process  Thought Processes: Goal  Directed  Descriptions of Associations:Circumstantial  Orientation:Full (Time, Place and Person)  Thought Content:Logical  History of Schizophrenia/Schizoaffective disorder:Yes  Duration of Psychotic Symptoms:Greater than six months  Hallucinations:Hallucinations: Auditory Description of Auditory Hallucinations: "whispers' Description of Visual Hallucinations: denies  Ideas of Reference:None  Suicidal Thoughts:Suicidal Thoughts: Yes, Passive SI Passive Intent and/or Plan: Without Intent; Without Plan; Without Access to Means; Without Means to Carry Out  Homicidal Thoughts:Homicidal Thoughts: No  Sensorium  Memory: Immediate Good; Remote Good  Judgment: Fair  Insight: Fair   Chartered certified accountant: Fair  Attention Span: Fair  Recall: Fair  Fund of Knowledge: Good  Language: Good   Psychomotor Activity  Psychomotor Activity: Psychomotor Activity: Normal   Assets  Assets: Communication Skills; Desire for Improvement   Sleep  Sleep: Sleep: Fair Number of Hours of Sleep: 6    Physical Exam: Physical Exam Vitals and nursing note reviewed.  HENT:     Head: Normocephalic and atraumatic.     Nose: Nose normal.  Pulmonary:     Effort: Pulmonary effort is normal.  Musculoskeletal:        General: Normal range of motion.     Cervical back: Normal range of motion.  Neurological:     General: No focal deficit present.     Mental Status: He is alert and oriented to person, place, and time. Mental status is at baseline.  Psychiatric:        Attention and Perception: Attention normal. He perceives auditory hallucinations.        Mood and Affect: Mood is depressed. Affect is labile and flat.        Speech: Speech normal.        Behavior: Behavior normal. Behavior is cooperative.        Thought Content: Thought content includes suicidal ideation. Thought content includes suicidal plan.        Cognition and Memory: Cognition and memory  normal.        Judgment: Judgment is impulsive.    Review of Systems  Psychiatric/Behavioral:  Positive for hallucinations and suicidal ideas. The patient is nervous/anxious and has insomnia.   All other systems reviewed and are negative.  Blood pressure 126/67, pulse 87, temperature 97.9 F (36.6 C), resp. rate 18, height 5\' 8"  (1.727 m), weight 83.9 kg, SpO2 97%. Body mass index is 28.12 kg/m.   Treatment Plan Summary: Daily contact with patient to assess and evaluate symptoms and progress in treatment and Medication management Increase Zyprexa (Olanzapine) to 15 mg daily to better manage auditory hallucinations and anxiety. Continue Melatonin 5 mg nightly to support sleep. Reassess medication efficacy in 1 week. [name], NP 12/26/2023, 8:01 PM

## 2023-12-26 NOTE — Group Note (Signed)
Recreation Therapy Group Note   Group Topic:Goal Setting  Group Date: 12/26/2023 Start Time: 1010 End Time: 1100 Facilitators: Rosina Lowenstein, LRT, CTRS Location:  Craft Room  Group Description: Product/process development scientist. Patients were given many different magazines, a glue stick, markers, and a piece of cardstock paper. LRT and pts discussed the importance of having goals in life. LRT and pts discussed the difference between short-term and long-term goals, as well as what a SMART goal is. LRT encouraged pts to create a vision board, with images they picked and then cut out with safety scissors from the magazine, for themselves, that capture their short and long-term goals. LRT encouraged pts to show and explain their vision board to the group.   Goal Area(s) Addressed:  Patient will gain knowledge of short vs. long term goals.  Patient will identify goals for themselves. Patient will practice setting SMART goals. Patient will verbalize their goals to LRT and peers.   Affect/Mood: Appropriate   Participation Level: Active and Engaged   Participation Quality: Independent   Behavior: Appropriate, Calm, and Cooperative   Speech/Thought Process: Coherent   Insight: Good   Judgement: Good   Modes of Intervention: Art, Education, Exploration, Guided Discussion, Socialization, and Support   Patient Response to Interventions:  Attentive, Engaged, Interested , and Receptive   Education Outcome:  Acknowledges education   Clinical Observations/Individualized Feedback: Jeremiah Newman was active in their participation of session activities and group discussion. Pt identified "I want to plant a garden for my mom, learn how to bake, and get more nice clothes" as his goals. Pt spontaneously contributed to group discussion while interacting well with LRT and peers duration of session.    Plan: Continue to engage patient in RT group sessions 2-3x/week.   [address] LRT, CTRS 12/26/2023 1:15 PM

## 2023-12-27 DIAGNOSIS — F250 Schizoaffective disorder, bipolar type: Secondary | ICD-10-CM | POA: Diagnosis not present

## 2023-12-27 NOTE — Group Note (Signed)
Date:  12/27/2023 Time:  4:09 PM  Group Topic/Focus:  Activity Group: The focus of the group is to promote activity for the patients and encourage them to go outside to the courtyard for fresh air and exercise.    Participation Level:  Did Not Attend   Mary Sella Burnett Spray 12/27/2023, 4:09 PM

## 2023-12-27 NOTE — Group Note (Signed)
Date:  12/27/2023 Time:  10:22 AM  Group Topic/Focus:  Early Warning Signs:   The focus of this group is to help patients identify signs or symptoms they exhibit before slipping into an unhealthy state or crisis.    Participation Level:  Active  Participation Quality:  Appropriate  Affect:  Appropriate  Cognitive:  Appropriate  Insight: Appropriate  Engagement in Group:  Engaged  Modes of Intervention:  Activity  Additional Comments:    Mary Sella Corneluis [place] 12/27/2023, 10:22 AM

## 2023-12-27 NOTE — Progress Notes (Signed)
[facility] MD Progress Note  12/27/2023 6:17 PM DAVINDER HAFF  MRN:  [id number] Subjective:  33 year old Caucasian male,presents with complaints of anxiety, depression, and passive thoughts of self-harm. He describes experiencing auditory hallucinations, stating he hears "whispers." The patient reports that his current medications are not effective, saying, "My depression takes a while to kick in," and shares that he often has preoccupying thoughts about his situation: "I get in my mind and start thinking about my situation."  Principal Problem: Schizoaffective disorder, bipolar type (HCC) Diagnosis: Principal Problem:   Schizoaffective disorder, bipolar type (HCC) Active Problems:   Suicidal ideation   PTSD (post-traumatic stress disorder)   Insomnia   Paranoia (psychosis) (HCC)  Total Time spent with patient: 45 minutes  Past Psychiatric History: see above  Past Medical History: History reviewed. No pertinent past medical history. History reviewed. No pertinent surgical history. Family History: History reviewed. No pertinent family history. Family Psychiatric  History: none reported Social History:  Social History   Substance and Sexual Activity  Alcohol Use Yes     Social History   Substance and Sexual Activity  Drug Use No    Social History   Socioeconomic History   Marital status: Single    Spouse name: Not on file   Number of children: Not on file   Years of education: Not on file   Highest education level: Not on file  Occupational History   Not on file  Tobacco Use   Smoking status: Some Days    Current packs/day: 1.00    Average packs/day: 1 pack/day for 9.1 years (9.1 ttl pk-yrs)    Types: Cigarettes    Start date: 2016   Smokeless tobacco: Never  Substance and Sexual Activity   Alcohol use: Yes   Drug use: No   Sexual activity: Not Currently  Other Topics Concern   Not on file  Social History Narrative   Not on file   Social Drivers of Health    Financial Resource Strain: Not on file  Food Insecurity: Food Insecurity Present (12/18/2023)   Hunger Vital Sign    Worried About Running Out of Food in the Last Year: Often true    Ran Out of Food in the Last Year: Often true  Transportation Needs: Unmet Transportation Needs (12/18/2023)   PRAPARE - Administrator, Civil Service (Medical): Yes    Lack of Transportation (Non-Medical): Yes  Physical Activity: Not on file  Stress: Not on file  Social Connections: Not on file   Additional Social History:                         Sleep: Fair  Appetite:  Good  Current Medications: Current Facility-Administered Medications  Medication Dose Route Frequency Provider Last Rate Last Admin   acetaminophen (TYLENOL) tablet 650 mg  650 mg Oral Q6H PRN [name], [name], MD   650 mg at 12/20/23 2106   alum & mag hydroxide-simeth (MAALOX/MYLANTA) 200-200-20 MG/5ML suspension 30 mL  30 mL Oral Q4H PRN [name], [name], MD       haloperidol (HALDOL) tablet 5 mg  5 mg Oral TID PRN [name], [name], MD       And   diphenhydrAMINE (BENADRYL) capsule 50 mg  50 mg Oral TID PRN [name], [name], MD       haloperidol lactate (HALDOL) injection 5 mg  5 mg Intramuscular TID PRN [name], MD       And   diphenhydrAMINE (  BENADRYL) injection 50 mg  50 mg Intramuscular TID PRN [name], [name], MD       And   LORazepam (ATIVAN) injection 2 mg  2 mg Intramuscular TID PRN [name], [name], MD       haloperidol lactate (HALDOL) injection 10 mg  10 mg Intramuscular TID PRN [name], [name], MD       And   diphenhydrAMINE (BENADRYL) injection 50 mg  50 mg Intramuscular TID PRN [name], [name], MD       And   LORazepam (ATIVAN) injection 2 mg  2 mg Intramuscular TID PRN [name], [name], MD       gabapentin (NEURONTIN) capsule 300 mg  300 mg Oral TID [name], NP   300 mg at 12/27/23 1656   hydrOXYzine (ATARAX) tablet 50 mg  50 mg Oral TID PRN  [name], NP   50 mg at 12/22/23 1704   lithium carbonate capsule 300 mg  300 mg Oral BID WC [name], [name], MD   300 mg at 12/27/23 1656   magnesium hydroxide (MILK OF MAGNESIA) suspension 30 mL  30 mL Oral Daily PRN [name], [name], MD       melatonin tablet 5 mg  5 mg Oral QHS [name], NP       nicotine (NICODERM CQ - dosed in mg/24 hours) patch 21 mg  21 mg Transdermal Daily [name], MD   21 mg at 12/27/23 0833   OLANZapine (ZYPREXA) tablet 15 mg  15 mg Oral BID [name], NP   15 mg at 12/27/23 1656    Lab Results: No results found for this or any previous visit (from the past 48 hours).  Blood Alcohol level:  Lab Results  Component Value Date   ETH <10 12/18/2023   ETH <10 10/08/2023    Metabolic Disorder Labs: Lab Results  Component Value Date   HGBA1C 4.7 (L) 12/23/2023   MPG 88.19 12/23/2023   MPG 93.93 10/10/2023   No results found for: "PROLACTIN" Lab Results  Component Value Date   CHOL 215 (H) 12/23/2023   TRIG 297 (H) 12/23/2023   HDL 40 (L) 12/23/2023   CHOLHDL 5.4 12/23/2023   VLDL 59 (H) 12/23/2023   LDLCALC 116 (H) 12/23/2023   LDLCALC 146 (H) 10/10/2023    Physical Findings: AIMS:  , ,  ,  ,    CIWA:    COWS:     Musculoskeletal: Strength & Muscle Tone: within normal limits Gait & Station: normal Patient leans: N/A  Psychiatric Specialty Exam:  Presentation  General Appearance:  Appropriate for Environment  Eye Contact: Minimal (Engaged in the conversation but avoids prolonged eye contact.)  Speech: Clear and Coherent  Speech Volume: Normal  Handedness: Right   Mood and Affect  Mood: Angry  Affect: Depressed   Thought Process  Thought Processes: Goal Directed (Preoccupied with his current situation and difficulties but remains linear and logical.)  Descriptions of Associations:Intact  Orientation:Full (Time, Place and Person)  Thought Content:Logical  History of  Schizophrenia/Schizoaffective disorder:Yes  Duration of Psychotic Symptoms:Greater than six months  Hallucinations:Hallucinations: Auditory Description of Auditory Hallucinations: "whispers." Description of Visual Hallucinations: denies  Ideas of Reference:None  Suicidal Thoughts:Suicidal Thoughts: Yes, Passive SI Passive Intent and/or Plan: Without Intent; Without Plan; Without Access to Means; Without Means to Carry Out  Homicidal Thoughts:Homicidal Thoughts: No   Sensorium  Memory: Immediate Fair; Recent Good; Remote Fair  Judgment: Impaired (Limited insight into how to manage anxiety but seeking help.)  Insight: Poor (as he recognizes his struggles and expresses a desire for support.)   Executive Functions  Concentration: Fair  Attention Span: Fair  Recall: Fair  Fund of Knowledge: Good  Language: Good   Psychomotor Activity  Psychomotor Activity: Psychomotor Activity: Normal   Assets  Assets: Communication Skills; Desire for Improvement; Resilience   Sleep  Sleep: Sleep: Good Number of Hours of Sleep: 6    Physical Exam: Physical Exam Vitals and nursing note reviewed.  Constitutional:      Appearance: Normal appearance.  HENT:     Head: Normocephalic and atraumatic.     Nose: Nose normal.  Pulmonary:     Effort: Pulmonary effort is normal.  Musculoskeletal:        General: Normal range of motion.     Cervical back: Normal range of motion.  Neurological:     General: No focal deficit present.     Mental Status: He is alert and oriented to person, place, and time. Mental status is at baseline.  Psychiatric:        Attention and Perception: Attention normal. He perceives auditory hallucinations.        Mood and Affect: Mood is anxious and depressed.        Speech: Speech normal.        Behavior: Behavior normal. Behavior is cooperative.        Thought Content: Thought content includes suicidal ideation.        Cognition and Memory:  Cognition and memory normal.        Judgment: Judgment is impulsive.    Review of Systems  Psychiatric/Behavioral:  Positive for depression, hallucinations, substance abuse and suicidal ideas. The patient is nervous/anxious and has insomnia.   All other systems reviewed and are negative.  Blood pressure 118/61, pulse 80, temperature 97.7 F (36.5 C), resp. rate 18, height 5\' 8"  (1.727 m), weight 83.9 kg, SpO2 98%. Body mass index is 28.12 kg/m.   Treatment Plan Summary: Daily contact with patient to assess and evaluate symptoms and progress in treatment and Medication management Zyprexa (Olanzapine) to 15 mg daily to better manage auditory hallucinations and anxiety.Melatonin 5 mg nightly to support sleep. Reassess medication efficacy in 1 week.  [name], NP 12/27/2023, 6:17 PM

## 2023-12-27 NOTE — Plan of Care (Signed)
Problem: Education: Goal: Emotional status will improve Outcome: Progressing Goal: Mental status will improve Outcome: Progressing   Problem: Activity: Goal: Interest or engagement in activities will improve Outcome: Not Progressing Goal: Sleeping patterns will improve Outcome: Progressing   Problem: Coping: Goal: Ability to verbalize frustrations and anger appropriately will improve Outcome: Progressing Goal: Ability to demonstrate self-control will improve Outcome: Progressing

## 2023-12-27 NOTE — BHH Counselor (Signed)
CSW met with pt briefly, per request. He inquired regarding information from [facility] [employer]. CSW informed pt that he has been declined due to his legal history. CSW asked if pt had been able to get in touch with the [facility] program. He informed CSW that they had recommended him going to a 30 day program prior to him coming there. Pt mentions the [facility]. CSW informed him that he would get him some information on this program. Pt also asked for application for [facility]. CSW informed him that he would get this to him. Pt received application.   CSW to give pt information on [employer] [employer] [facility].   Vilma Meckel. Algis Greenhouse, MSW, LCSW, LCAS 12/27/2023 10:39 AM

## 2023-12-27 NOTE — Plan of Care (Signed)
  Problem: Education: Goal: Knowledge of [facility] Education information/materials will improve Outcome: Progressing Goal: Emotional status will improve Outcome: Not Progressing Goal: Mental status will improve Outcome: Progressing   Problem: Activity: Goal: Interest or engagement in activities will improve Outcome: Not Progressing   Problem: Coping: Goal: Ability to verbalize frustrations and anger appropriately will improve Outcome: Progressing

## 2023-12-27 NOTE — BHH Counselor (Addendum)
Pt received information on [facility] [employer] [facility]. He expressed interest in referral for this program being completed. He also shared plans to contact [facility]. CSW agreed to complete referral for pt for [facility]. No other concerns expressed. Contact ended without incident.   CSW completed online referral for pt. H&P, MAR, and UDS to be sent to [email or web address].   Vilma Meckel. Algis Greenhouse, MSW, LCSW, LCAS 12/27/2023 11:16 AM  H&P, MAR, and lab results were sent to [email or web address].   Vilma Meckel. Algis Greenhouse, MSW, LCSW, LCAS 12/27/2023 11:41 AM

## 2023-12-27 NOTE — Progress Notes (Signed)
Patient endorses passive SI, denies HI. He reported to this RN he has "a lot going on up there" and that life is overwhelming right now. He endorsed chronic auditory hallucinations- reported he hears whispers. Denied visual hallucinations. Patient calm and interacting appropriately on the unit.

## 2023-12-27 NOTE — Group Note (Signed)
Recreation Therapy Group Note   Group Topic:Leisure Education  Group Date: 12/27/2023 Start Time: 1000 End Time: 1100 Facilitators: Rosina Lowenstein, LRT, CTRS Location:  Craft Room  Group Description: Leisure. Patients were given the option to choose from singing karaoke, coloring mandalas, using oil pastels, journaling, or playing with play-doh. LRT and pts discussed the meaning of leisure, the importance of participating in leisure during their free time/when they're outside of the hospital, as well as how our leisure interests can also serve as coping skills.   Goal Area(s) Addressed:  Patient will identify a current leisure interest.  Patient will learn the definition of "leisure". Patient will practice making a positive decision. Patient will have the opportunity to try a new leisure activity. Patient will communicate with peers and LRT.    Affect/Mood: Appropriate   Participation Level: Active and Engaged   Participation Quality: Independent   Behavior: Appropriate, Calm, and Cooperative   Speech/Thought Process: Coherent   Insight: Good   Judgement: Good   Modes of Intervention: Education, Exploration, Music, and Socialization   Patient Response to Interventions:  Attentive, Engaged, Interested , and Receptive   Education Outcome:  Acknowledges education   Clinical Observations/Individualized Feedback: Jeremiah Newman was active in their participation of session activities and group discussion. Pt came late to group, however joined with no issue. Pt chose to make origami and sing karaoke while in group. Pt interacted well with LRT and peers duration of session.    Plan: Continue to engage patient in RT group sessions 2-3x/week.   Rosina Lowenstein, LRT, CTRS 12/27/2023 12:57 PM

## 2023-12-27 NOTE — Progress Notes (Signed)
Isolative to self and room. No complaints or concerns voiced. Endorses passive SI, verbally contracts for safety. No interactions with peers this shift. Refused scheduled Melatonin. Encouragement and support provided. Safety checks maintained. Medications given as prescribed. Pt receptive and remains safe on unit with q 15 min checks.

## 2023-12-28 DIAGNOSIS — F250 Schizoaffective disorder, bipolar type: Secondary | ICD-10-CM | POA: Diagnosis not present

## 2023-12-28 NOTE — Plan of Care (Signed)
Problem: Activity: Goal: Interest or engagement in activities will improve Outcome: Not Progressing Goal: Sleeping patterns will improve Outcome: Not Progressing

## 2023-12-28 NOTE — Progress Notes (Signed)
[facility] MD Progress Note  12/29/2023 2:22 PM Jeremiah Newman  MRN:  [id number] Subjective:  33 year old Caucasian male presenting with anxiety, isolation, and depression. He reports passive suicidal ideation but denies any intent or plan. He describes self-blame and preoccupation with his current situation. Principal Problem: Schizoaffective disorder, bipolar type (HCC) Diagnosis: Principal Problem:   Schizoaffective disorder, bipolar type (HCC) Active Problems:   Suicidal ideation   PTSD (post-traumatic stress disorder)   Insomnia   Paranoia (psychosis) (HCC)  Total Time spent with patient: 1.5 hours  Past Psychiatric History: see above  Past Medical History: History reviewed. No pertinent past medical history. History reviewed. No pertinent surgical history. Family History: History reviewed. No pertinent family history. Family Psychiatric  History: none reported Social History:  Social History   Substance and Sexual Activity  Alcohol Use Yes     Social History   Substance and Sexual Activity  Drug Use No    Social History   Socioeconomic History   Marital status: Single    Spouse name: Not on file   Number of children: Not on file   Years of education: Not on file   Highest education level: Not on file  Occupational History   Not on file  Tobacco Use   Smoking status: Some Days    Current packs/day: 1.00    Average packs/day: 1 pack/day for 9.1 years (9.1 ttl pk-yrs)    Types: Cigarettes    Start date: 2016   Smokeless tobacco: Never  Substance and Sexual Activity   Alcohol use: Yes   Drug use: No   Sexual activity: Not Currently  Other Topics Concern   Not on file  Social History Narrative   Not on file   Social Drivers of Health   Financial Resource Strain: Not on file  Food Insecurity: Food Insecurity Present (12/18/2023)   Hunger Vital Sign    Worried About Running Out of Food in the Last Year: Often true    Ran Out of Food in the Last Year: Often  true  Transportation Needs: Unmet Transportation Needs (12/18/2023)   PRAPARE - Administrator, Civil Service (Medical): Yes    Lack of Transportation (Non-Medical): Yes  Physical Activity: Not on file  Stress: Not on file  Social Connections: Not on file   Additional Social History:                         Sleep: Good  Appetite:  Good  Current Medications: Current Facility-Administered Medications  Medication Dose Route Frequency Provider Last Rate Last Admin   acetaminophen (TYLENOL) tablet 650 mg  650 mg Oral Q6H PRN [name], [name], MD   650 mg at 12/20/23 2106   alum & mag hydroxide-simeth (MAALOX/MYLANTA) 200-200-20 MG/5ML suspension 30 mL  30 mL Oral Q4H PRN [name], [name], MD       haloperidol (HALDOL) tablet 5 mg  5 mg Oral TID PRN [name], [name], MD       And   diphenhydrAMINE (BENADRYL) capsule 50 mg  50 mg Oral TID PRN [name], [name], MD       haloperidol lactate (HALDOL) injection 5 mg  5 mg Intramuscular TID PRN [name], [name], MD       And   diphenhydrAMINE (BENADRYL) injection 50 mg  50 mg Intramuscular TID PRN [name], [name], MD       And   LORazepam (ATIVAN) injection 2 mg  2 mg Intramuscular TID PRN [name], [name],  MD       haloperidol lactate (HALDOL) injection 10 mg  10 mg Intramuscular TID PRN [name], [name], MD       And   diphenhydrAMINE (BENADRYL) injection 50 mg  50 mg Intramuscular TID PRN [name], [name], MD       And   LORazepam (ATIVAN) injection 2 mg  2 mg Intramuscular TID PRN [name], [name], MD       gabapentin (NEURONTIN) capsule 300 mg  300 mg Oral TID [name], NP   300 mg at 12/29/23 1153   hydrOXYzine (ATARAX) tablet 50 mg  50 mg Oral TID PRN [name], NP   50 mg at 12/22/23 1704   lithium carbonate capsule 300 mg  300 mg Oral BID WC [name], [name], MD   300 mg at 12/29/23 0802   magnesium hydroxide (MILK OF MAGNESIA) suspension 30 mL  30 mL Oral Daily PRN  [name], [name], MD       melatonin tablet 5 mg  5 mg Oral QHS [name], NP       nicotine (NICODERM CQ - dosed in mg/24 hours) patch 21 mg  21 mg Transdermal Daily [name], MD   21 mg at 12/28/23 0822   OLANZapine (ZYPREXA) tablet 15 mg  15 mg Oral BID [name], NP   15 mg at 12/29/23 8469    Lab Results: No results found for this or any previous visit (from the past 48 hours).  Blood Alcohol level:  Lab Results  Component Value Date   ETH <10 12/18/2023   ETH <10 10/08/2023    Metabolic Disorder Labs: Lab Results  Component Value Date   HGBA1C 4.7 (L) 12/23/2023   MPG 88.19 12/23/2023   MPG 93.93 10/10/2023   No results found for: "PROLACTIN" Lab Results  Component Value Date   CHOL 215 (H) 12/23/2023   TRIG 297 (H) 12/23/2023   HDL 40 (L) 12/23/2023   CHOLHDL 5.4 12/23/2023   VLDL 59 (H) 12/23/2023   LDLCALC 116 (H) 12/23/2023   LDLCALC 146 (H) 10/10/2023    Physical Findings: AIMS:  , ,  ,  ,    CIWA:    COWS:     Musculoskeletal: Strength & Muscle Tone: within normal limits Gait & Station: normal Patient leans: N/A  Psychiatric Specialty Exam:  Presentation  General Appearance:  Appropriate for Environment  Eye Contact: Good  Speech: Clear and Coherent; Normal Rate  Speech Volume: Normal  Handedness: Right   Mood and Affect  Mood: Anxious; Irritable  Affect: Flat (Preoccupied.)   Thought Process  Thought Processes: Coherent (self-blaming.)  Descriptions of Associations:Intact  Orientation:Full (Time, Place and Person)  Thought Content:Logical  History of Schizophrenia/Schizoaffective disorder:Yes  Duration of Psychotic Symptoms:Greater than six months  Hallucinations:Hallucinations: None Description of Auditory Hallucinations: denies Description of Visual Hallucinations: denies  Ideas of Reference:None  Suicidal Thoughts:SI Passive Intent and/or Plan: Without Intent; Without Plan; Without Means to  Carry Out; Without Access to Means  Homicidal Thoughts:Homicidal Thoughts: No   Sensorium  Memory: Immediate Fair; Recent Good; Remote Good  Judgment: Fair  Insight: Fair   Chartered certified accountant: Fair  Attention Span: Fair  Recall: Fair  Fund of Knowledge: Good  Language: Good   Psychomotor Activity  Psychomotor Activity: Psychomotor Activity: Normal   Assets  Assets: Communication Skills; Desire for Improvement   Sleep  Sleep: Sleep: Good Number of Hours of Sleep: 7    Physical Exam: Physical Exam Vitals  and nursing note reviewed.  Constitutional:      Appearance: Normal appearance.  HENT:     Head: Normocephalic and atraumatic.     Nose: Nose normal.  Pulmonary:     Effort: Pulmonary effort is normal.  Musculoskeletal:        General: Normal range of motion.     Cervical back: Normal range of motion.  Neurological:     General: No focal deficit present.     Mental Status: He is alert and oriented to person, place, and time. Mental status is at baseline.  Psychiatric:        Attention and Perception: Attention and perception normal.        Mood and Affect: Mood is anxious and depressed. Affect is flat.        Speech: Speech normal.        Behavior: Behavior is withdrawn. Behavior is cooperative.        Thought Content: Thought content includes suicidal ideation.        Cognition and Memory: Cognition and memory normal.        Judgment: Judgment normal.    Review of Systems  Psychiatric/Behavioral:  Positive for suicidal ideas. The patient is nervous/anxious.   All other systems reviewed and are negative.  Blood pressure 122/75, pulse 71, temperature 98.5 F (36.9 C), resp. rate (!) 21, height 5\' 8"  (1.727 m), weight 83.9 kg, SpO2 95%. Body mass index is 28.12 kg/m.   Treatment Plan Summary: Daily contact with patient to assess and evaluate symptoms and progress in treatment and Medication management Zyprexa  (Olanzapine) to 15 mg daily to better manage auditory hallucinations and anxiety. Continue Melatonin 5 mg nightly to support sleep. Reassess medication efficacy in 1 week.  [name], NP 12/28/2023, 6:54 PM

## 2023-12-28 NOTE — Progress Notes (Signed)
   12/28/23 0400  Psych Admission Type (Psych Patients Only)  Admission Status Voluntary  Psychosocial Assessment  Patient Complaints Isolation  Eye Contact Brief  Facial Expression Sullen;Worried  Affect Preoccupied  Education administrator Activity Slow  Appearance/Hygiene In scrubs  Behavior Characteristics Cooperative  Mood Preoccupied  Aggressive Behavior  Effect No apparent injury  Thought Process  Coherency WDL  Content Blaming others  Delusions None reported or observed  Perception Derealization  Hallucination None reported or observed  Judgment WDL  Confusion None  Danger to Self  Current suicidal ideation? Denies  Self-Injurious Behavior No self-injurious ideation or behavior indicators observed or expressed   Agreement Not to Harm Self Yes  Description of Agreement Verbal  Danger to Others  Danger to Others None reported or observed

## 2023-12-28 NOTE — Plan of Care (Signed)
Problem: Education: Goal: Mental status will improve Outcome: Progressing   Problem: Activity: Goal: Interest or engagement in activities will improve Outcome: Progressing

## 2023-12-28 NOTE — Group Note (Signed)
Date:  12/28/2023 Time:  8:33 PM  Group Topic/Focus:  Wrap-Up Group:   The focus of this group is to help patients review their daily goal of treatment and discuss progress on daily workbooks.    Participation Level:  Active  Participation Quality:  Appropriate and Attentive  Affect:  Appropriate  Cognitive:  Alert and Appropriate  Insight: Appropriate  Engagement in Group:  Engaged  Modes of Intervention:  Discussion  Additional Comments:     [name],[name] 12/28/2023, 8:33 PM

## 2023-12-28 NOTE — Progress Notes (Signed)
Patient admitted to [facility] voluntarily on December 18, 2023 for SI, AH, paranoia, and reports of poor sleep. UDS + for THC, cocaine, and amph.  Patient endorses passive SI with no plan. He denies HI/AVH. Patient complains of having no energy. Engaged in active listening. Patient noted to be isolative in his room but will appear for meals and meds.  Q15 minute unit checks in place.

## 2023-12-29 DIAGNOSIS — F250 Schizoaffective disorder, bipolar type: Secondary | ICD-10-CM | POA: Diagnosis not present

## 2023-12-29 MED ORDER — NICOTINE POLACRILEX 2 MG MT GUM
2.0000 mg | CHEWING_GUM | OROMUCOSAL | Status: DC | PRN
  Administered 2023-12-30 – 2024-01-01 (×5): 2 mg via ORAL
  Filled 2023-12-29 (×6): qty 1

## 2023-12-29 NOTE — Plan of Care (Signed)
Problem: Education: Goal: Emotional status will improve Outcome: Progressing Goal: Mental status will improve Outcome: Progressing Goal: Verbalization of understanding the information provided will improve Outcome: Progressing

## 2023-12-29 NOTE — Progress Notes (Signed)
[facility] MD Progress Note  12/29/2023 2:24 PM Jeremiah Newman  MRN:  [id number] Subjective:  33 year old Caucasian male presenting with anxiety, isolation, and depression. He reports passive suicidal ideation but denies any intent or plan. He describes self-blame and preoccupation with his current situation. Principal Problem: Schizoaffective disorder, bipolar type (HCC) Diagnosis: Principal Problem:   Schizoaffective disorder, bipolar type (HCC) Active Problems:   Suicidal ideation   PTSD (post-traumatic stress disorder)   Insomnia   Paranoia (psychosis) (HCC)  Total Time spent with patient: 45 minutes  Past Psychiatric History: see above  Past Medical History: History reviewed. No pertinent past medical history. History reviewed. No pertinent surgical history. Family History: History reviewed. No pertinent family history. Family Psychiatric  History: none reported Social History:  Social History   Substance and Sexual Activity  Alcohol Use Yes     Social History   Substance and Sexual Activity  Drug Use No    Social History   Socioeconomic History   Marital status: Single    Spouse name: Not on file   Number of children: Not on file   Years of education: Not on file   Highest education level: Not on file  Occupational History   Not on file  Tobacco Use   Smoking status: Some Days    Current packs/day: 1.00    Average packs/day: 1 pack/day for 9.1 years (9.1 ttl pk-yrs)    Types: Cigarettes    Start date: 2016   Smokeless tobacco: Never  Substance and Sexual Activity   Alcohol use: Yes   Drug use: No   Sexual activity: Not Currently  Other Topics Concern   Not on file  Social History Narrative   Not on file   Social Drivers of Health   Financial Resource Strain: Not on file  Food Insecurity: Food Insecurity Present (12/18/2023)   Hunger Vital Sign    Worried About Running Out of Food in the Last Year: Often true    Ran Out of Food in the Last Year: Often  true  Transportation Needs: Unmet Transportation Needs (12/18/2023)   PRAPARE - Administrator, Civil Service (Medical): Yes    Lack of Transportation (Non-Medical): Yes  Physical Activity: Not on file  Stress: Not on file  Social Connections: Not on file   Additional Social History:                         Sleep: Good  Appetite:  Good  Current Medications: Current Facility-Administered Medications  Medication Dose Route Frequency Provider Last Rate Last Admin   acetaminophen (TYLENOL) tablet 650 mg  650 mg Oral Q6H PRN [name], [name], MD   650 mg at 12/20/23 2106   alum & mag hydroxide-simeth (MAALOX/MYLANTA) 200-200-20 MG/5ML suspension 30 mL  30 mL Oral Q4H PRN [name], [name], MD       haloperidol (HALDOL) tablet 5 mg  5 mg Oral TID PRN [name], [name], MD       And   diphenhydrAMINE (BENADRYL) capsule 50 mg  50 mg Oral TID PRN [name], [name], MD       haloperidol lactate (HALDOL) injection 5 mg  5 mg Intramuscular TID PRN [name], [name], MD       And   diphenhydrAMINE (BENADRYL) injection 50 mg  50 mg Intramuscular TID PRN [name], [name], MD       And   LORazepam (ATIVAN) injection 2 mg  2 mg Intramuscular TID PRN [name], [name],  MD       haloperidol lactate (HALDOL) injection 10 mg  10 mg Intramuscular TID PRN [name], [name], MD       And   diphenhydrAMINE (BENADRYL) injection 50 mg  50 mg Intramuscular TID PRN [name], [name], MD       And   LORazepam (ATIVAN) injection 2 mg  2 mg Intramuscular TID PRN [name], [name], MD       gabapentin (NEURONTIN) capsule 300 mg  300 mg Oral TID [name], NP   300 mg at 12/29/23 1153   hydrOXYzine (ATARAX) tablet 50 mg  50 mg Oral TID PRN [name], NP   50 mg at 12/22/23 1704   lithium carbonate capsule 300 mg  300 mg Oral BID WC [name], [name], MD   300 mg at 12/29/23 0802   magnesium hydroxide (MILK OF MAGNESIA) suspension 30 mL  30 mL Oral Daily PRN  [name], [name], MD       melatonin tablet 5 mg  5 mg Oral QHS [name], NP       nicotine (NICODERM CQ - dosed in mg/24 hours) patch 21 mg  21 mg Transdermal Daily [name], MD   21 mg at 12/28/23 0822   OLANZapine (ZYPREXA) tablet 15 mg  15 mg Oral BID [name], NP   15 mg at 12/29/23 7829    Lab Results: No results found for this or any previous visit (from the past 48 hours).  Blood Alcohol level:  Lab Results  Component Value Date   ETH <10 12/18/2023   ETH <10 10/08/2023    Metabolic Disorder Labs: Lab Results  Component Value Date   HGBA1C 4.7 (L) 12/23/2023   MPG 88.19 12/23/2023   MPG 93.93 10/10/2023   No results found for: "PROLACTIN" Lab Results  Component Value Date   CHOL 215 (H) 12/23/2023   TRIG 297 (H) 12/23/2023   HDL 40 (L) 12/23/2023   CHOLHDL 5.4 12/23/2023   VLDL 59 (H) 12/23/2023   LDLCALC 116 (H) 12/23/2023   LDLCALC 146 (H) 10/10/2023    Physical Findings: AIMS:  , ,  ,  ,    CIWA:    COWS:     Musculoskeletal: Strength & Muscle Tone: within normal limits Gait & Station: normal Patient leans: N/A  Psychiatric Specialty Exam:  Presentation  General Appearance:  Appropriate for Environment  Eye Contact: Good  Speech: Clear and Coherent; Normal Rate  Speech Volume: Normal  Handedness: Right   Mood and Affect  Mood: Anxious; Irritable  Affect: Flat (Preoccupied.)   Thought Process  Thought Processes: Coherent (self-blaming.)  Descriptions of Associations:Intact  Orientation:Full (Time, Place and Person)  Thought Content:Logical  History of Schizophrenia/Schizoaffective disorder:Yes  Duration of Psychotic Symptoms:Greater than six months  Hallucinations:Hallucinations: None Description of Auditory Hallucinations: denies Description of Visual Hallucinations: denies  Ideas of Reference:None  Suicidal Thoughts:SI Passive Intent and/or Plan: Without Intent; Without Plan; Without Means to  Carry Out; Without Access to Means  Homicidal Thoughts:Homicidal Thoughts: No   Sensorium  Memory: Immediate Fair; Recent Good; Remote Good  Judgment: Fair  Insight: Fair   Chartered certified accountant: Fair  Attention Span: Fair  Recall: Fair  Fund of Knowledge: Good  Language: Good   Psychomotor Activity  Psychomotor Activity: Psychomotor Activity: Normal   Assets  Assets: Communication Skills; Desire for Improvement   Sleep  Sleep: Sleep: Good Number of Hours of Sleep: 7    Physical Exam: Physical Exam Vitals  and nursing note reviewed.  Constitutional:      Appearance: Normal appearance.  HENT:     Head: Normocephalic and atraumatic.     Nose: Nose normal.  Pulmonary:     Effort: Pulmonary effort is normal.  Musculoskeletal:        General: Normal range of motion.     Cervical back: Normal range of motion.  Neurological:     General: No focal deficit present.     Mental Status: He is alert and oriented to person, place, and time. Mental status is at baseline.  Psychiatric:        Attention and Perception: Attention and perception normal.        Mood and Affect: Mood is anxious and depressed. Affect is flat.        Speech: Speech normal.        Behavior: Behavior is withdrawn. Behavior is cooperative.        Thought Content: Thought content includes suicidal ideation.        Cognition and Memory: Cognition and memory normal.        Judgment: Judgment is impulsive.    Review of Systems  Psychiatric/Behavioral:  Positive for depression and suicidal ideas. The patient is nervous/anxious.   All other systems reviewed and are negative.  Blood pressure 122/75, pulse 71, temperature 98.5 F (36.9 C), resp. rate (!) 21, height 5\' 8"  (1.727 m), weight 83.9 kg, SpO2 95%. Body mass index is 28.12 kg/m.   Treatment Plan Summary: Daily contact with patient to assess and evaluate symptoms and progress in treatment and Medication  management Zyprexa (Olanzapine) to 15 mg daily to better manage auditory hallucinations and anxiety. Continue Melatonin 5 mg nightly to support sleep. Reassess medication efficacy in 1 week.  [name], NP [name], NP 12/29/2023, 2:24 PM

## 2023-12-29 NOTE — Plan of Care (Signed)
Continue to utilize coping skills and process with staff feeling and thoughts.

## 2023-12-29 NOTE — Group Note (Signed)
Date:  12/29/2023 Time:  6:42 PM  Group Topic/Focus:  Individual therapy ;  to increase understanding of one's thought and behavior patterns to help increase function and well-being. Art Therapy ; to help people explore emotions, develop self-awareness, cope with stress, boost self-esteem, and work on Pharmacist, community.   Participation Level:  Active  Participation Quality:  Appropriate  Affect:  Appropriate  Cognitive:  Alert and Appropriate  Insight: Appropriate  Engagement in Group:  Developing/Improving  Modes of Intervention:  Activity  Additional Comments:    Brucha Ahlquist 12/29/2023, 6:42 PM

## 2023-12-29 NOTE — Group Note (Signed)
Date:  12/29/2023 Time:  11:34 AM  Group Topic/Focus:  Goals Group:   The focus of this group is to help patients establish daily goals to achieve during treatment and discuss how the patient can incorporate goal setting into their daily lives to aide in recovery. Healthy Communication:   The focus of this group is to discuss communication, barriers to communication, as well as healthy ways to communicate with others.    Participation Level:  Did Not Attend   Lynelle Smoke [facility] 12/29/2023, 11:34 AM

## 2023-12-29 NOTE — Progress Notes (Signed)
   12/29/23 0802  Psych Admission Type (Psych Patients Only)  Admission Status Voluntary  Psychosocial Assessment  Patient Complaints Anxiety;Depression  Eye Contact Fair  Facial Expression Flat  Affect Preoccupied;Anxious  Speech Logical/coherent  Interaction Minimal  Motor Activity Other (Comment) (WDL)  Appearance/Hygiene In scrubs  Behavior Characteristics Cooperative;Anxious  Mood Anxious;Preoccupied  Thought Process  Coherency WDL  Content Blaming self  Delusions None reported or observed  Perception WDL  Hallucination None reported or observed  Judgment Poor  Confusion None  Danger to Self  Current suicidal ideation? Passive  Agreement Not to Harm Self Yes  Description of Agreement Verbal  Danger to Others  Danger to Others None reported or observed

## 2023-12-29 NOTE — Progress Notes (Signed)
Nursing Shift Note:  1900-0700  Attended Evening Group: yes, engaged Medication Compliant:  n/a Behavior: cooperative Sleep Quality: good Significant Changes:none    12/29/23 0300  Psych Admission Type (Psych Patients Only)  Admission Status Voluntary  Psychosocial Assessment  Patient Complaints Isolation;Depression  Eye Contact Brief  Facial Expression Sad  Affect Preoccupied  Speech Logical/coherent  Interaction Isolative  Motor Activity Slow  Appearance/Hygiene In scrubs  Behavior Characteristics Cooperative  Mood Preoccupied  Thought Process  Coherency WDL  Content Blaming self  Delusions None reported or observed  Perception WDL  Hallucination None reported or observed  Judgment WDL  Confusion None  Danger to Self  Current suicidal ideation? Passive  Danger to Others  Danger to Others None reported or observed

## 2023-12-29 NOTE — Plan of Care (Signed)
Problem: Activity: Goal: Interest or engagement in activities will improve Outcome: Progressing

## 2023-12-29 NOTE — Group Note (Signed)
Date:  12/29/2023 Time:  8:47 PM  Group Topic/Focus:  Wrap-Up Group:   The focus of this group is to help patients review their daily goal of treatment and discuss progress on daily workbooks.    Participation Level:  Minimal  Participation Quality:  Appropriate and Attentive  Affect:  Appropriate  Cognitive:  Alert and Appropriate  Insight: Appropriate  Engagement in Group:  Engaged  Modes of Intervention:  Discussion  Additional Comments:     Jeremiah Newman,Jeremiah Newman 12/29/2023, 8:47 PM

## 2023-12-30 DIAGNOSIS — F250 Schizoaffective disorder, bipolar type: Secondary | ICD-10-CM | POA: Diagnosis not present

## 2023-12-30 NOTE — Plan of Care (Signed)
support

## 2023-12-30 NOTE — Group Note (Signed)
Date:  12/30/2023 Time:  10:17 AM  Group Topic/Focus:  Developing a Wellness Toolbox:   The focus of this group is to help patients develop a "wellness toolbox" with skills and strategies to promote recovery upon discharge. Identifying Needs:   The focus of this group is to help patients identify their personal needs that have been historically problematic and identify healthy behaviors to address their needs.    Participation Level:  Active  Participation Quality:  Appropriate  Affect:  Appropriate  Cognitive:  Appropriate  Insight: Appropriate  Engagement in Group:  Developing/Improving  Modes of Intervention:  Activity and Discussion  Additional Comments:    Theta Leaf 12/30/2023, 10:17 AM

## 2023-12-30 NOTE — Progress Notes (Signed)
Withdrawn/isolated to room, and affect flat.  Paranoia - believes people are peeping through the window and therefore, adjusted the windows curtains to seal out all visible openings. Endorsed AVH - noise whispers and seeing shadows; denied any command to harm self; but, indicated "if I wanted to harm myself I can use the sheet.  I will not do it."  Safety contract entered - pt will contact and process with staff.

## 2023-12-30 NOTE — Group Note (Signed)
[facility] LCSW Group Therapy Note    Group Date: 12/30/2023 Start Time: 1300 End Time: 1345  Type of Therapy and Topic:  Group Therapy:  Overcoming Obstacles  Participation Level:  [facility] PARTICIPATION LEVEL: Did Not Attend   Description of Group:   In this group patients will be encouraged to explore what they see as obstacles to their own wellness and recovery. They will be guided to discuss their thoughts, feelings, and behaviors related to these obstacles. The group will process together ways to cope with barriers, with attention given to specific choices patients can make. Each patient will be challenged to identify changes they are motivated to make in order to overcome their obstacles. This group will be process-oriented, with patients participating in exploration of their own experiences as well as giving and receiving support and challenge from other group members.  Therapeutic Goals: 1. Patient will identify personal and current obstacles as they relate to admission. 2. Patient will identify barriers that currently interfere with their wellness or overcoming obstacles.  3. Patient will identify feelings, thought process and behaviors related to these barriers. 4. Patient will identify two changes they are willing to make to overcome these obstacles:    Summary of Patient Progress X   Therapeutic Modalities:   Cognitive Behavioral Therapy Solution Focused Therapy Motivational Interviewing Relapse Prevention Therapy   Glenis Smoker, LCSW

## 2023-12-30 NOTE — BH IP Treatment Plan (Signed)
Interdisciplinary Treatment and Diagnostic Plan Update  12/30/2023 Time of Session: 08:30 Jeremiah Newman MRN: [id number]  Principal Diagnosis: Schizoaffective disorder, bipolar type [name])  Secondary Diagnoses: Principal Problem:   Schizoaffective disorder, bipolar type (HCC) Active Problems:   Suicidal ideation   PTSD (post-traumatic stress disorder)   Insomnia   Paranoia (psychosis) (HCC)   Current Medications:  Current Facility-Administered Medications  Medication Dose Route Frequency Provider Last Rate Last Admin   acetaminophen (TYLENOL) tablet 650 mg  650 mg Oral Q6H PRN [name], MD   650 mg at 12/20/23 2106   alum & mag hydroxide-simeth (MAALOX/MYLANTA) 200-200-20 MG/5ML suspension 30 mL  30 mL Oral Q4H PRN [name], [name], MD       haloperidol (HALDOL) tablet 5 mg  5 mg Oral TID PRN [name], MD       And   diphenhydrAMINE (BENADRYL) capsule 50 mg  50 mg Oral TID PRN [name], [name], MD       haloperidol lactate (HALDOL) injection 5 mg  5 mg Intramuscular TID PRN [name], [name], MD       And   diphenhydrAMINE (BENADRYL) injection 50 mg  50 mg Intramuscular TID PRN [name], [name], MD       And   LORazepam (ATIVAN) injection 2 mg  2 mg Intramuscular TID PRN [name], [name], MD       haloperidol lactate (HALDOL) injection 10 mg  10 mg Intramuscular TID PRN [name], [name], MD       And   diphenhydrAMINE (BENADRYL) injection 50 mg  50 mg Intramuscular TID PRN [name], [name], MD       And   LORazepam (ATIVAN) injection 2 mg  2 mg Intramuscular TID PRN [name], [name], MD       gabapentin (NEURONTIN) capsule 300 mg  300 mg Oral TID [name], [name], NP   300 mg at 12/30/23 4098   hydrOXYzine (ATARAX) tablet 50 mg  50 mg Oral TID PRN [name], NP   50 mg at 12/22/23 1704   lithium carbonate capsule 300 mg  300 mg Oral BID WC [name], [name], MD   300 mg at 12/30/23 1191   magnesium hydroxide (MILK OF MAGNESIA)  suspension 30 mL  30 mL Oral Daily PRN [name], [name], MD       melatonin tablet 5 mg  5 mg Oral QHS [name], NP   5 mg at 12/29/23 2113   nicotine (NICODERM CQ - dosed in mg/24 hours) patch 21 mg  21 mg Transdermal Daily [name], MD   21 mg at 12/30/23 4782   nicotine polacrilex (NICORETTE) gum 2 mg  2 mg Oral PRN [name], NP   2 mg at 12/30/23 0826   OLANZapine (ZYPREXA) tablet 15 mg  15 mg Oral BID [name], NP   15 mg at 12/30/23 0825   PTA Medications: Medications Prior to Admission  Medication Sig Dispense Refill Last Dose/Taking   escitalopram (LEXAPRO) 10 MG tablet Take 3 tablets (30 mg total) by mouth daily. 90 tablet 0    escitalopram (LEXAPRO) 20 MG tablet Take 1 tablet (20 mg total) by mouth daily. 30 tablet 1    escitalopram (LEXAPRO) 20 MG tablet Take 1 tablet (20 mg total) by mouth daily. 30 tablet 0    gabapentin (NEURONTIN) 300 MG capsule Take 1 capsule (300 mg total) by mouth 3 (three) times daily. 90 capsule 0    mirtazapine (REMERON) 7.5 MG tablet Take 1 tablet (7.5  mg total) by mouth at bedtime. 30 tablet 0    pantoprazole (PROTONIX) 40 MG tablet Take 1 tablet (40 mg total) by mouth daily. 30 tablet 0    QUEtiapine (SEROQUEL) 200 MG tablet Take 1 tablet (200 mg total) by mouth at bedtime. (Patient not taking: Reported on 12/18/2023) 30 tablet 0    traZODone (DESYREL) 100 MG tablet Take 1 tablet (100 mg total) by mouth at bedtime as needed for sleep. 30 tablet 0     Patient Stressors:    Patient Strengths:    Treatment Modalities: Medication Management, Group therapy, Case management,  1 to 1 session with clinician, Psychoeducation, Recreational therapy.   Physician Treatment Plan for Primary Diagnosis: Schizoaffective disorder, bipolar type (HCC) Long Term Goal(s): Improvement in symptoms so as ready for discharge   Short Term Goals: Ability to identify changes in lifestyle to reduce recurrence of condition will improve Ability to  verbalize feelings will improve Ability to disclose and discuss suicidal ideas Ability to demonstrate self-control will improve Ability to identify and develop effective coping behaviors will improve Compliance with prescribed medications will improve Ability to identify triggers associated with substance abuse/mental health issues will improve  Medication Management: Evaluate patient's response, side effects, and tolerance of medication regimen.  Therapeutic Interventions: 1 to 1 sessions, Unit Group sessions and Medication administration.  Evaluation of Outcomes: Progressing  Physician Treatment Plan for Secondary Diagnosis: Principal Problem:   Schizoaffective disorder, bipolar type (HCC) Active Problems:   Suicidal ideation   PTSD (post-traumatic stress disorder)   Insomnia   Paranoia (psychosis) (HCC)  Long Term Goal(s): Improvement in symptoms so as ready for discharge   Short Term Goals: Ability to identify changes in lifestyle to reduce recurrence of condition will improve Ability to verbalize feelings will improve Ability to disclose and discuss suicidal ideas Ability to demonstrate self-control will improve Ability to identify and develop effective coping behaviors will improve Compliance with prescribed medications will improve Ability to identify triggers associated with substance abuse/mental health issues will improve     Medication Management: Evaluate patient's response, side effects, and tolerance of medication regimen.  Therapeutic Interventions: 1 to 1 sessions, Unit Group sessions and Medication administration.  Evaluation of Outcomes: Progressing   RN Treatment Plan for Primary Diagnosis: Schizoaffective disorder, bipolar type (HCC) Long Term Goal(s): Knowledge of disease and therapeutic regimen to maintain health will improve  Short Term Goals: Ability to remain free from injury will improve, Ability to verbalize frustration and anger appropriately will  improve, Ability to demonstrate self-control, Ability to participate in decision making will improve, Ability to verbalize feelings will improve, Ability to disclose and discuss suicidal ideas, Ability to identify and develop effective coping behaviors will improve, and Compliance with prescribed medications will improve  Medication Management: RN will administer medications as ordered by provider, will assess and evaluate patient's response and provide education to patient for prescribed medication. RN will report any adverse and/or side effects to prescribing provider.  Therapeutic Interventions: 1 on 1 counseling sessions, Psychoeducation, Medication administration, Evaluate responses to treatment, Monitor vital signs and CBGs as ordered, Perform/monitor CIWA, COWS, AIMS and Fall Risk screenings as ordered, Perform wound care treatments as ordered.  Evaluation of Outcomes: Progressing   LCSW Treatment Plan for Primary Diagnosis: Schizoaffective disorder, bipolar type (HCC) Long Term Goal(s): Safe transition to appropriate next level of care at discharge, Engage patient in therapeutic group addressing interpersonal concerns.  Short Term Goals: Engage patient in aftercare planning with referrals and resources, Increase social  support, Increase ability to appropriately verbalize feelings, Increase emotional regulation, Facilitate acceptance of mental health diagnosis and concerns, Facilitate patient progression through stages of change regarding substance use diagnoses and concerns, Identify triggers associated with mental health/substance abuse issues, and Increase skills for wellness and recovery  Therapeutic Interventions: Assess for all discharge needs, 1 to 1 time with Social worker, Explore available resources and support systems, Assess for adequacy in community support network, Educate family and significant other(s) on suicide prevention, Complete Psychosocial Assessment, Interpersonal group  therapy.  Evaluation of Outcomes: Progressing   Progress in Treatment: Attending groups: Yes. and No. Participating in groups: Yes. Taking medication as prescribed: Yes. Toleration medication: Yes. Family/Significant other contact made: No, will contact:  if given permission.  Patient understands diagnosis: Yes. Discussing patient identified problems/goals with staff: Yes. Medical problems stabilized or resolved: Yes. Denies suicidal/homicidal ideation: Yes. Issues/concerns per patient self-inventory: No. Other: none.   New problem(s) identified: No, Describe:  none identified. 12/25/23 Update: None identified. Patient's initial fears of harming others have appeared to  have subsided as he is being more involved in treatment and not as isolated. Update 12/30/23: No changes at this time.    New Short Term/Long Term Goal(s):detox, elimination of symptoms of psychosis, medication management for mood stabilization; elimination of SI thoughts; development of comprehensive mental wellness/sobriety plan. 12/25/23 Update: detox, elimination of symptoms of psychosis, medication management for mood stabilization; elimination of SI thoughts; development of comprehensive mental wellness/sobriety plan. Update 12/30/23: No changes at this time.    Patient Goals:  "I need to be in a structured environment."  12/25/23 Update: Patient's goal to remain the same. Patient has also verbalized his desire to attend another inpatient treatment facility for substance use. Update 12/30/23: No changes at this time.   Discharge Plan or Barriers: CSW will assist pt with development of an appropriate aftercare/discharge plan. 12/25/23 Update: CSW goal to remain the same. Update 12/30/23: Pt continues to seek residential substance use treatment.   Reason for Continuation of Hospitalization: Depression Hallucinations Medication stabilization Suicidal ideation 12/25/23 Update: Depression Hallucinations Medication  stabilization Suicidal ideation   Estimated Length of Stay:1-7 days  12/25/23 Update: TBD Update 12/30/23: No changes at this time.  Last 3 [place] Suicide Severity Risk Score: Flowsheet Row Admission (Current) from 12/18/2023 in [facility]PATIENT BE[facility]AV[facility]AL MEDICINE Most recent reading at 12/18/2023  4:32 PM ED from 12/18/2023 in [facility] Emergency Department at [facility] Most recent reading at 12/18/2023 12:17 PM Admission (Discharged) from 10/09/2023 in [facility] INPATIENT BEHAVIORAL MEDICINE Most recent reading at 10/09/2023  4:00 AM  C-SSRS RISK CATEGORY High Risk High Risk Moderate Risk       Last PHQ 2/9 Scores:     No data to display          Scribe for Treatment Team: Glenis Smoker, LCSW 12/30/2023 10:08 AM

## 2023-12-30 NOTE — Plan of Care (Signed)
Problem: Education: Goal: Knowledge of [facility] Education information/materials will improve Outcome: Progressing Goal: Emotional status will improve Outcome: Progressing Goal: Mental status will improve Outcome: Progressing Goal: Verbalization of understanding the information provided will improve Outcome: Progressing   Problem: Activity: Goal: Interest or engagement in activities will improve Outcome: Progressing Goal: Sleeping patterns will improve Outcome: Progressing

## 2023-12-30 NOTE — BHH Counselor (Signed)
CSW received notification that pt is outside of the catchment area for state funds for the [facility]. Out of pocket cost would be $9,100 and would be due at intake.   CSW informed pt of this information. Pt stated that this was too much. No other concerns expressed. Contact ended without incident.   Vilma Meckel. Algis Greenhouse, MSW, LCSW, LCAS 12/30/2023 3:16 PM

## 2023-12-30 NOTE — Progress Notes (Signed)
Patient endorses passive SI- reported this was related to being turned down from multiple facilities due to his past. Patient also endorses chronic auditory hallucinations which he described as "whispers". Patient denies HI/VH. Patient calm and cooperative on the unit. Patient compliant with current medication regimen. His main goal is finding somewhere to go post-discharge

## 2023-12-30 NOTE — BHH Counselor (Signed)
Pt requested list of [employer] CSW left list in pt room.   Vilma Meckel. Algis Greenhouse, MSW, LCSW, LCAS 12/30/2023 3:39 PM

## 2023-12-30 NOTE — Group Note (Signed)
Recreation Therapy Group Note   Group Topic:General Recreation  Group Date: 12/30/2023 Start Time: 1500 End Time: 1600 Facilitators: Rosina Lowenstein, LRT, CTRS Location:  Courtyard  Group Description: [employer]. LRT and patients played games of basketball, drew with chalk, and played corn hole while outside in the courtyard while getting fresh air and sunlight. Music was being played in the background. LRT and peers conversed about different games they have played before, what they do in their free time and anything else that is on their minds. LRT encouraged pts to drink water after being outside, sweating and getting their heart rate up.  Goal Area(s) Addressed: Patient will build on frustration tolerance skills. Patients will partake in a competitive play game with peers. Patients will gain knowledge of new leisure interest/hobby.    Affect/Mood: Appropriate   Participation Level: Active and Engaged   Participation Quality: Independent   Behavior: Appropriate, Calm, and Cooperative   Speech/Thought Process: Coherent   Insight: Good   Judgement: Good   Modes of Intervention: Activity   Patient Response to Interventions:  Attentive, Engaged, and Receptive   Education Outcome:  Acknowledges education   Clinical Observations/Individualized Feedback: Jeremiah Newman was active in their participation of session activities and group discussion. Pt identified "I haven't worked out in over a month. I feel out of shape but looking forward to getting back into it". Pt was observed doing push ups and squats while outside. Pt interacted well with LRT and peers duration of session.    Plan: Continue to engage patient in RT group sessions 2-3x/week.   Rosina Lowenstein, LRT, CTRS 12/30/2023 5:30 PM

## 2023-12-30 NOTE — Group Note (Signed)
Recreation Therapy Group Note   Group Topic:Healthy Support Systems  Group Date: 12/30/2023 Start Time: 1000 End Time: 1040 Facilitators: Rosina Lowenstein, LRT, CTRS Location:  Craft Room  Group Description: Straw Bridge. In groups or individually, patients were given 10 plastic drinking straws and an equal length of masking tape. Using the materials provided, patients were instructed to build a free-standing bridge-like structure to suspend an everyday item (ex: deck of cards) off the floor or table surface. All materials were required to be used in Secondary school teacher. LRT facilitated post-activity discussion reviewing the importance of having strong and healthy support systems in our lives. LRT discussed how the people in our lives serve as the tape and the deck of cards we placed on top of our straw structure are the stressors we face in daily life. LRT and pts discussed what happens in our life when things get too heavy for [place], and we don't have strong supports outside of the hospital. Pt shared 2 of their healthy supports in their life aloud in the group.   Goal Area(s) Addressed:  Patient will identify 2 healthy supports in their life. Patient will identify skills to successfully complete activity. Patient will identify correlation of this activity to life post-discharge.  Patient will build on frustration tolerance skills. Patient will increase team building and communication skills.    Affect/Mood: Appropriate   Participation Level: Moderate   Participation Quality: Minimal Cues   Behavior: Alert   Speech/Thought Process: Barely audible  and Coherent   Insight: Fair   Judgement: Fair    Modes of Intervention: Exploration, Group work, Guided Discussion, Dentist, STEM Activity, and Team-building   Patient Response to Interventions:  Receptive   Education Outcome:  Acknowledges education   Clinical Observations/Individualized Feedback: Jeremiah Newman was mostly active in their  participation of session activities and group discussion. Pt identified "God and Jesus" as his healthy supports. Pt required minimal cues to stop talking while LRT was in the middle of processing discussion. Pt interacted well with LRT and peers duration of session.    Plan: Continue to engage patient in RT group sessions 2-3x/week.   Rosina Lowenstein, LRT, CTRS 12/30/2023 12:02 PM

## 2023-12-30 NOTE — BHH Counselor (Addendum)
CSW met with pt briefly, per request. He stated that he had contacted [employer] and that he needed their application which could be found at [email or web address]. CSW agreed to get this for pt. No other concerns expressed. Contact ended without incident.   Pt received application for [employer] for completion. Pt turned in application. Completed application was faxed to [phone].  Vilma Meckel. Algis Greenhouse, MSW, LCSW, LCAS 12/30/2023 10:55 AM

## 2023-12-30 NOTE — Group Note (Signed)
Date:  12/30/2023 Time:  9:21 PM  Group Topic/Focus:  Wrap-Up Group:   The focus of this group is to help patients review their daily goal of treatment and discuss progress on daily workbooks.    Participation Level:  Active  Participation Quality:  Appropriate  Affect:  Appropriate  Cognitive:  Appropriate  Insight: Appropriate  Engagement in Group:  Engaged  Modes of Intervention:  Discussion    Lenore Cordia 12/30/2023, 9:21 PM

## 2023-12-31 DIAGNOSIS — F250 Schizoaffective disorder, bipolar type: Secondary | ICD-10-CM | POA: Diagnosis not present

## 2023-12-31 MED ORDER — LITHIUM CARBONATE 300 MG PO CAPS
600.0000 mg | ORAL_CAPSULE | Freq: Every day | ORAL | Status: DC
  Administered 2023-12-31 – 2024-01-01 (×2): 600 mg via ORAL
  Filled 2023-12-31 (×2): qty 2

## 2023-12-31 MED ORDER — PANTOPRAZOLE SODIUM 40 MG PO TBEC
40.0000 mg | DELAYED_RELEASE_TABLET | Freq: Every day | ORAL | Status: DC
  Administered 2024-01-01: 40 mg via ORAL
  Filled 2023-12-31: qty 1

## 2023-12-31 MED ORDER — LITHIUM CARBONATE 300 MG PO CAPS
300.0000 mg | ORAL_CAPSULE | Freq: Every day | ORAL | Status: DC
  Administered 2023-12-31 – 2024-01-01 (×2): 300 mg via ORAL
  Filled 2023-12-31 (×2): qty 1

## 2023-12-31 NOTE — Group Note (Signed)
Date:  12/31/2023 Time:  11:12 AM  Group Topic/Focus:  Goals Group:   The focus of this group is to help patients establish daily goals to achieve during treatment and discuss how the patient can incorporate goal setting into their daily lives to aide in recovery.    Participation Level:  Did Not Attend   Lynelle Smoke [facility] 12/31/2023, 11:12 AM

## 2023-12-31 NOTE — BHH Counselor (Signed)
CSW touched base with [employer] on patient's behalf. [employer] informed CSW that patient's application is still under review by the clinical team.   [employer] requests that CSW touch base in the morning. This has been communicated to patient and team.    Reymundo Poll, MSW, [facility] 12/31/2023 3:39 PM

## 2023-12-31 NOTE — Plan of Care (Signed)
Problem: Education: Goal: Knowledge of [facility] Education information/materials will improve Outcome: Progressing Goal: Emotional status will improve Outcome: Progressing

## 2023-12-31 NOTE — Progress Notes (Signed)
[facility] MD Progress Note  12/30/2023 1330 VAIL VUNCANNON  MRN:  [id number]  Subjective:  Jeremiah Newman is a 33 year old Caucasian male with a history of schizoaffective disorder bipolar type and PTSD who presented voluntarily with reports of suicidal ideation, command auditory hallucinations, intrusive thoughts, and paranoia.   Chart reviewed, case discussed with multidisciplinary team, patient seen on rounds. Jeremiah Newman has been intermittently visible in the milieu today and intermittently isolating in his room. He continues to report significant anxiety, hypervigilance, paranoia. Chronic auditory hallucinations. He is adherent with medication and denies side effects. Sleeping and eating well. Actively engaged in discharge planning and has been denied from multiple long-term treatment facilities based on funding and need for ongoing psychotropic medication management.   Principal Problem: Schizoaffective disorder, bipolar type (HCC) Diagnosis: Principal Problem:   Schizoaffective disorder, bipolar type (HCC) Active Problems:   Suicidal ideation   PTSD (post-traumatic stress disorder)   Insomnia   Paranoia (psychosis) (HCC)  Total Time spent with patient: 20 minutes  Past Psychiatric History: see h&p  Past Medical History: History reviewed. No pertinent past medical history. History reviewed. No pertinent surgical history. Family History: History reviewed. No pertinent family history. Family Psychiatric  History: see h&p Social History:  Social History   Substance and Sexual Activity  Alcohol Use Yes     Social History   Substance and Sexual Activity  Drug Use No    Social History   Socioeconomic History   Marital status: Single    Spouse name: Not on file   Number of children: Not on file   Years of education: Not on file   Highest education level: Not on file  Occupational History   Not on file  Tobacco Use   Smoking status: Some Days    Current packs/day: 1.00     Average packs/day: 1 pack/day for 9.1 years (9.1 ttl pk-yrs)    Types: Cigarettes    Start date: 2016   Smokeless tobacco: Never  Substance and Sexual Activity   Alcohol use: Yes   Drug use: No   Sexual activity: Not Currently  Other Topics Concern   Not on file  Social History Narrative   Not on file   Social Drivers of Health   Financial Resource Strain: Not on file  Food Insecurity: Food Insecurity Present (12/18/2023)   Hunger Vital Sign    Worried About Running Out of Food in the Last Year: Often true    Ran Out of Food in the Last Year: Often true  Transportation Needs: Unmet Transportation Needs (12/18/2023)   PRAPARE - Administrator, Civil Service (Medical): Yes    Lack of Transportation (Non-Medical): Yes  Physical Activity: Not on file  Stress: Not on file  Social Connections: Not on file   Additional Social History:                         Sleep: Good  Appetite:  Good  Current Medications: Current Facility-Administered Medications  Medication Dose Route Frequency Provider Last Rate Last Admin   acetaminophen (TYLENOL) tablet 650 mg  650 mg Oral Q6H PRN [name], [name], MD   650 mg at 12/20/23 2106   alum & mag hydroxide-simeth (MAALOX/MYLANTA) 200-200-20 MG/5ML suspension 30 mL  30 mL Oral Q4H PRN [name], Nathan, MD       haloperidol (HALDOL) tablet 5 mg  5 mg Oral TID PRN [name], MD       And  diphenhydrAMINE (BENADRYL) capsule 50 mg  50 mg Oral TID PRN [name], [name], MD       haloperidol lactate (HALDOL) injection 5 mg  5 mg Intramuscular TID PRN [name], [name], MD       And   diphenhydrAMINE (BENADRYL) injection 50 mg  50 mg Intramuscular TID PRN [name], [name], MD       And   LORazepam (ATIVAN) injection 2 mg  2 mg Intramuscular TID PRN [name], [name], MD       haloperidol lactate (HALDOL) injection 10 mg  10 mg Intramuscular TID PRN [name], [name], MD       And   diphenhydrAMINE (BENADRYL)  injection 50 mg  50 mg Intramuscular TID PRN [name], [name], MD       And   LORazepam (ATIVAN) injection 2 mg  2 mg Intramuscular TID PRN [name], [name], MD       gabapentin (NEURONTIN) capsule 300 mg  300 mg Oral TID [name], NP   300 mg at 12/30/23 1718   hydrOXYzine (ATARAX) tablet 50 mg  50 mg Oral TID PRN [name], NP   50 mg at 12/22/23 1704   lithium carbonate capsule 300 mg  300 mg Oral BID WC [name], [name], MD   300 mg at 12/30/23 1718   magnesium hydroxide (MILK OF MAGNESIA) suspension 30 mL  30 mL Oral Daily PRN [name], [name], MD       melatonin tablet 5 mg  5 mg Oral QHS [name], NP   5 mg at 12/29/23 2113   nicotine (NICODERM CQ - dosed in mg/24 hours) patch 21 mg  21 mg Transdermal Daily [name], MD   21 mg at 12/30/23 7829   nicotine polacrilex (NICORETTE) gum 2 mg  2 mg Oral PRN [name], NP   2 mg at 12/30/23 1718   OLANZapine (ZYPREXA) tablet 15 mg  15 mg Oral BID [name], NP   15 mg at 12/30/23 1718    Lab Results: No results found for this or any previous visit (from the past 48 hours).  Blood Alcohol level:  Lab Results  Component Value Date   ETH <10 12/18/2023   ETH <10 10/08/2023    Metabolic Disorder Labs: Lab Results  Component Value Date   HGBA1C 4.7 (L) 12/23/2023   MPG 88.19 12/23/2023   MPG 93.93 10/10/2023   No results found for: "PROLACTIN" Lab Results  Component Value Date   CHOL 215 (H) 12/23/2023   TRIG 297 (H) 12/23/2023   HDL 40 (L) 12/23/2023   CHOLHDL 5.4 12/23/2023   VLDL 59 (H) 12/23/2023   LDLCALC 116 (H) 12/23/2023   LDLCALC 146 (H) 10/10/2023    Physical Findings: AIMS:  , ,  ,  ,    CIWA:    COWS:     Musculoskeletal: Strength & Muscle Tone: within normal limits Gait & Station: normal Patient leans: N/A  Psychiatric Specialty Exam:  Presentation  General Appearance:  Appropriate for Environment; Fairly Groomed  Eye Contact: Minimal  Speech: Clear  and Coherent; Normal Rate  Speech Volume: Decreased  Handedness: Right   Mood and Affect  Mood: -- (Sad)  Affect: -- (Preoccupied)   Thought Process  Thought Processes: Coherent (blaming others and self)  Descriptions of Associations:Intact  Orientation:Full (Time, Place and Person)  Thought Content:Logical  History of Schizophrenia/Schizoaffective disorder:Yes  Duration of Psychotic Symptoms:Greater than six months  Hallucinations:auditory hallucinations  Ideas of Reference:None  Suicidal Thoughts:  Yes, denies intent or plan  Homicidal Thoughts:Yes, intrusive thoughts without intent or plan   Sensorium  Memory: Immediate Fair; Recent Good; Remote Good  Judgment: Good  Insight: Good   Executive Functions  Concentration: Fair  Attention Span: Fair  Recall: Fair  Fund of Knowledge: Good  Language: Good   Psychomotor Activity  Psychomotor Activity:Normal   Assets  Assets: Communication Skills; Desire for Improvement   Sleep  Sleep:Good   Physical Exam: Physical Exam Vitals and nursing note reviewed.  HENT:     Nose: Nose normal.  Pulmonary:     Effort: Pulmonary effort is normal.  Musculoskeletal:        General: Normal range of motion.     Cervical back: Normal range of motion.  Skin:    General: Skin is warm and dry.  Neurological:     Mental Status: He is alert.    Review of Systems  Psychiatric/Behavioral:  Positive for depression, hallucinations, substance abuse and suicidal ideas. The patient is nervous/anxious.   All other systems reviewed and are negative.  Blood pressure 138/77, pulse 95, temperature (!) 97.3 F (36.3 C), resp. rate 20, height 5\' 8"  (1.727 m), weight 83.9 kg, SpO2 98%. Body mass index is 28.12 kg/m.   Treatment Plan Summary:  Daily contact with patient to assess and evaluate symptoms and progress in treatment and Medication management. Patient's case to be discussed in multi-disciplinary  team meeting.   Observation Level/Precautions:  Voluntary admission; 15 minute checks; Precautions: assault, suicide  Laboratory:   labs reviewed; lithium level, A1c, lipid panel, TSH, RPR on 12/23/23  Psychotherapy:  recommend individual and group therapy  Medications:  see below  Consultations:  social work for discharge planning  Discharge Concerns:  need to safety plan (access to father's firearms); medication compliance and effectiveness; substance use  Estimated LOS: 14-21 days  Discharge planning: patient seeking long-term substance use treatment program; follow-up with PCP for HLD    - Increase Lithium 300mg  PO daily with breakfast and 600mg  PO daily with supper (lithium level 0.14 on 12/23/23) - Continue hydroxyzine 25mg  PO tid prn anxiety, started 12/18/23 - Continue gabapentin 300mg  PO TID for anxiety (off-label), started 1/16 - Continue olanzapine 15mg  PO daily for schizoaffective disorder - Continue melatonin 5mg  PO at bedtime for sleep --  The risks/benefits/side-effects/alternatives to this medication were discussed in detail with the patient and time was given for questions. The patient consents to medication trial.              -- Metabolic profile and EKG monitoring obtained while on an atypical antipsychotic (QTc: 433)              -- Encouraged patient to participate in unit milieu and in scheduled group therapies     Long Term Goal(s): Improvement in symptoms so as ready for discharge   Short Term Goals: Ability to identify changes in lifestyle to reduce recurrence of condition will improve, Ability to verbalize feelings will improve, Ability to disclose and discuss suicidal ideas, Ability to demonstrate self-control will improve, Ability to identify and develop effective coping behaviors will improve, Compliance with prescribed medications will improve, and Ability to identify triggers associated with substance abuse/mental health issues will improve  Levelle Edelen Robyne Peers,  NP

## 2023-12-31 NOTE — Group Note (Signed)
Recreation Therapy Group Note   Group Topic:Stress Management  Group Date: 12/31/2023 Start Time: 1000 End Time: 1055 Facilitators: Rosina Lowenstein, LRT, CTRS Location:  Craft Room  Group Description: Taboo. LRT and patients played the game Taboo. The object of the game is to have peers guess the word up at the top of the card drawn that is in bold, while being sure to not use any of the descriptive words down below it on the same card. If the person attempting to explain the word in bold uses any of the descriptive words down below, they lose their turn, and no one receives that card or point. LRT and patient's took turns being the one to describe the words while the rest of the group tried to guess what they were describing.   Goal Area(s) Addressed: Patient will identify physical symptoms of stress. Patient will identify emotional symptoms of stress. Patient will identify coping skills for stress. Patient will build frustration tolerance skills.  Patient will increase communication.   Affect/Mood: Appropriate   Participation Level: Active and Engaged   Participation Quality: Independent   Behavior: Appropriate, Calm, and Cooperative   Speech/Thought Process: Coherent   Insight: Good   Judgement: Good   Modes of Intervention: Cooperative Play, Group work, Guided Discussion, Socialization, and Team-building   Patient Response to Interventions:  Attentive, Engaged, Interested , and Receptive   Education Outcome:  Acknowledges education   Clinical Observations/Individualized Feedback: Karsin was active in their participation of session activities and group discussion. Pt identified "thoughts start to race" as a sign os stress. Pt interacted well with LRT and peers duration of session.    Plan: Continue to engage patient in RT group sessions 2-3x/week.   Rosina Lowenstein, LRT, CTRS 12/31/2023 11:43 AM

## 2023-12-31 NOTE — Group Note (Signed)
Date:  12/31/2023 Time:  11:09 PM  Group Topic/Focus:  Orientation:   The focus of this group is to educate the patient on the purpose and policies of crisis stabilization and provide a format to answer questions about their admission.  The group details unit policies and expectations of patients while admitted.    Participation Level:  Did Not Attend  Participation Quality:   none  Affect:   none  Cognitive:   none  Insight: None  Engagement in Group:   none  Modes of Intervention:   none  Additional Comments:  none   Azam Gervasi 12/31/2023, 11:09 PM

## 2023-12-31 NOTE — Group Note (Signed)
LCSW Group Therapy Note   Group Date: 12/31/2023 Start Time: 1330 End Time: 1430   Type of Therapy and Topic:  Group Therapy: Challenging Core Beliefs  Participation Level:  Did Not Attend  Description of Group:  Patients were educated about core beliefs and asked to identify one harmful core belief that they have. Patients were asked to explore from where those beliefs originate. Patients were asked to discuss how those beliefs make them feel and the resulting behaviors of those beliefs. They were then be asked if those beliefs are true and, if so, what evidence they have to support them. Lastly, group members were challenged to replace those negative core beliefs with helpful beliefs.   Therapeutic Goals:   1. Patient will identify harmful core beliefs and explore the origins of such beliefs. 2. Patient will identify feelings and behaviors that result from those core beliefs. 3. Patient will discuss whether such beliefs are true. 4.  Patient will replace harmful core beliefs with helpful ones.  Summary of Patient Progress:   Patient declined to attend treatment team.  Therapeutic Modalities: Cognitive Behavioral Therapy; Solution-Focused Therapy   Harden Mo, Theresia Majors 12/31/2023  2:49 PM

## 2023-12-31 NOTE — BHH Counselor (Signed)
Referral sent to [facility] via fax on patient's behalf. Patient provided [employer]'s number to conduct phone screening at 12 PM.   CSW team to continue to assess.    Reymundo Poll, MSW, LCSWA 12/31/2023 10:03 AM

## 2023-12-31 NOTE — Progress Notes (Signed)
   12/30/23 2000  Psych Admission Type (Psych Patients Only)  Admission Status Voluntary  Psychosocial Assessment  Patient Complaints Anxiety  Eye Contact Fair  Facial Expression Flat  Affect Appropriate to circumstance  Speech Logical/coherent  Interaction Assertive  Motor Activity Slow  Appearance/Hygiene Unremarkable  Behavior Characteristics Cooperative;Anxious  Mood Anxious  Aggressive Behavior  Effect No apparent injury  Thought Process  Coherency WDL  Content WDL  Delusions None reported or observed  Perception Hallucinations  Hallucination Auditory  Judgment Impaired  Confusion None  Danger to Self  Current suicidal ideation? Passive  Danger to Others  Danger to Others None reported or observed

## 2023-12-31 NOTE — Progress Notes (Signed)
   12/31/23 1300  Psych Admission Type (Psych Patients Only)  Admission Status Voluntary  Psychosocial Assessment  Patient Complaints Anxiety (anxiety about following up with outpatient facilities)  Eye Contact Fair  Facial Expression Worried  Affect Anxious;Appropriate to circumstance  Speech Logical/coherent  Interaction Assertive  Motor Activity Slow  Appearance/Hygiene Unremarkable  Behavior Characteristics Cooperative;Appropriate to situation  Mood Pleasant  Aggressive Behavior  Effect No apparent injury  Thought Process  Coherency WDL  Content Blaming others (all the treatment facilities that have declined him)  Delusions None reported or observed  Perception WDL  Hallucination None reported or observed  Judgment WDL  Confusion None  Danger to Self  Current suicidal ideation? Denies  Self-Injurious Behavior No self-injurious ideation or behavior indicators observed or expressed   Agreement Not to Harm Self Yes  Description of Agreement Vaerbal  Danger to Others  Danger to Others None reported or observed

## 2023-12-31 NOTE — Plan of Care (Signed)
  Problem: Education: Goal: Knowledge of [facility] Education information/materials will improve Outcome: Progressing Goal: Emotional status will improve Outcome: Progressing Goal: Mental status will improve Outcome: Progressing Goal: Verbalization of understanding the information provided will improve Outcome: Progressing   Problem: Activity: Goal: Interest or engagement in activities will improve Outcome: Progressing Goal: Sleeping patterns will improve Outcome: Progressing   Problem: Coping: Goal: Ability to verbalize frustrations and anger appropriately will improve Outcome: Progressing Goal: Ability to demonstrate self-control will improve Outcome: Progressing   Problem: Health Behavior/Discharge Planning: Goal: Identification of resources available to assist in meeting health care needs will improve Outcome: Progressing Goal: Compliance with treatment plan for underlying cause of condition will improve Outcome: Progressing   Problem: Safety: Goal: Periods of time without injury will increase Outcome: Progressing   Problem: Education: Goal: Knowledge of General Education information will improve Description: Including pain rating scale, medication(s)/side effects and non-pharmacologic comfort measures Outcome: Progressing   Problem: Health Behavior/Discharge Planning: Goal: Ability to manage health-related needs will improve Outcome: Progressing   Problem: Clinical Measurements: Goal: Ability to maintain clinical measurements within normal limits will improve Outcome: Progressing Goal: Will remain free from infection Outcome: Progressing Goal: Diagnostic test results will improve Outcome: Progressing Goal: Respiratory complications will improve Outcome: Progressing Goal: Cardiovascular complication will be avoided Outcome: Progressing   Problem: Activity: Goal: Risk for activity intolerance will decrease Outcome: Progressing   Problem:  Nutrition: Goal: Adequate nutrition will be maintained Outcome: Progressing   Problem: Coping: Goal: Level of anxiety will decrease Outcome: Progressing   Problem: Elimination: Goal: Will not experience complications related to bowel motility Outcome: Progressing Goal: Will not experience complications related to urinary retention Outcome: Progressing   Problem: Pain Managment: Goal: General experience of comfort will improve and/or be controlled Outcome: Progressing   Problem: Safety: Goal: Ability to remain free from injury will improve Outcome: Progressing   Problem: Skin Integrity: Goal: Risk for impaired skin integrity will decrease Outcome: Progressing

## 2023-12-31 NOTE — Group Note (Signed)
Recreation Therapy Group Note   Group Topic:Health and Wellness  Group Date: 12/31/2023 Start Time: 1500 End Time: 1600 Facilitators: Rosina Lowenstein, LRT, CTRS Location: Courtyard  Group Description: [employer]. LRT and patients played games of basketball, drew with chalk, and played corn hole while outside in the courtyard while getting fresh air and sunlight. Music was being played in the background. LRT and peers conversed about different games they have played before, what they do in their free time and anything else that is on their minds. LRT encouraged pts to drink water after being outside, sweating and getting their heart rate up.  Goal Area(s) Addressed: Patient will build on frustration tolerance skills. Patients will partake in a competitive play game with peers. Patients will gain knowledge of new leisure interest/hobby.    Affect/Mood: Appropriate   Participation Level: Active   Participation Quality: Independent   Behavior: Appropriate   Speech/Thought Process: Coherent   Insight: Good   Judgement: Good   Modes of Intervention: Activity and Music   Patient Response to Interventions:  Attentive and Receptive   Education Outcome:  Acknowledges education   Clinical Observations/Individualized Feedback: Jeremiah Newman was active in their participation of session activities and group discussion. Pt chose to listen to music and talk with peers while outside in group. Pt interacted well with LRT and peers duration of session.    Plan: Continue to engage patient in RT group sessions 2-3x/week.   Rosina Lowenstein, LRT, CTRS 12/31/2023 5:41 PM

## 2024-01-01 ENCOUNTER — Other Ambulatory Visit: Payer: Self-pay

## 2024-01-01 DIAGNOSIS — F250 Schizoaffective disorder, bipolar type: Secondary | ICD-10-CM | POA: Diagnosis not present

## 2024-01-01 MED ORDER — NICOTINE POLACRILEX 2 MG MT GUM
2.0000 mg | CHEWING_GUM | OROMUCOSAL | 0 refills | Status: DC | PRN
  Filled 2024-01-01: qty 100, 20d supply, fill #0
  Filled 2024-01-01: qty 100, fill #0

## 2024-01-01 MED ORDER — GABAPENTIN 300 MG PO CAPS
300.0000 mg | ORAL_CAPSULE | Freq: Three times a day (TID) | ORAL | 0 refills | Status: DC
  Filled 2024-01-01: qty 90, 30d supply, fill #0

## 2024-01-01 MED ORDER — MELATONIN 5 MG PO TABS
5.0000 mg | ORAL_TABLET | Freq: Every day | ORAL | 0 refills | Status: DC
  Filled 2024-01-01: qty 30, 30d supply, fill #0

## 2024-01-01 MED ORDER — OLANZAPINE 15 MG PO TABS
15.0000 mg | ORAL_TABLET | Freq: Two times a day (BID) | ORAL | 0 refills | Status: DC
  Filled 2024-01-01 (×2): qty 60, 30d supply, fill #0

## 2024-01-01 MED ORDER — LITHIUM CARBONATE 300 MG PO CAPS
ORAL_CAPSULE | ORAL | 0 refills | Status: DC
  Filled 2024-01-01: qty 90, 30d supply, fill #0

## 2024-01-01 MED ORDER — NICOTINE 21 MG/24HR TD PT24
21.0000 mg | MEDICATED_PATCH | Freq: Every day | TRANSDERMAL | 0 refills | Status: DC
  Filled 2024-01-01: qty 28, 28d supply, fill #0

## 2024-01-01 MED ORDER — HYDROXYZINE HCL 50 MG PO TABS
50.0000 mg | ORAL_TABLET | Freq: Three times a day (TID) | ORAL | 0 refills | Status: DC | PRN
  Filled 2024-01-01: qty 30, 10d supply, fill #0

## 2024-01-01 MED ORDER — LITHIUM CARBONATE 300 MG PO CAPS
300.0000 mg | ORAL_CAPSULE | Freq: Every day | ORAL | 0 refills | Status: DC
  Filled 2024-01-01: qty 30, 30d supply, fill #0

## 2024-01-01 MED ORDER — PANTOPRAZOLE SODIUM 40 MG PO TBEC
40.0000 mg | DELAYED_RELEASE_TABLET | Freq: Every day | ORAL | 0 refills | Status: DC
  Filled 2024-01-01: qty 30, 30d supply, fill #0

## 2024-01-01 NOTE — Plan of Care (Signed)
Problem: Activity: Goal: Interest or engagement in activities will improve Outcome: Not Progressing Goal: Sleeping patterns will improve Outcome: Not Progressing

## 2024-01-01 NOTE — BHH Counselor (Signed)
CSW touched base with [employer] on patient's behalf. [employer] informed CSW that patient's application is still under review by the clinical team.   [employer] encouraged team to touch base after lunch today.   This has been communicated to patient and team.    CSW team to continue to assess.    Reymundo Poll, MSW, LCSWA 01/01/2024 10:34 AM

## 2024-01-01 NOTE — BHH Counselor (Signed)
CSW faxed referral to [facility] [place].  CSW received confirmation that fax was successful.  Penni Homans, MSW, LCSW 01/01/2024 12:02 PM

## 2024-01-01 NOTE — BHH Counselor (Signed)
CSW coordinated transportation with [employer] services. Taxi should be to [facility] by 7:30 PM to get patient to [facility] by 9 PM.   This has been communicated to team and patient.   CSW team to continue to assess.     Reymundo Poll, MSW, LCSWA 01/01/2024 2:44 PM

## 2024-01-01 NOTE — Plan of Care (Signed)
Problem: Education: Goal: Emotional status will improve Outcome: Progressing Goal: Mental status will improve Outcome: Progressing Goal: Verbalization of understanding the information provided will improve Outcome: Progressing

## 2024-01-01 NOTE — Group Note (Signed)
LCSW Group Therapy Note   Group Date: 01/01/2024 Start Time: 1300 End Time: 1401   Type of Therapy and Topic:  Group Therapy: Challenging Core Beliefs  Participation Level:  Did Not Attend  Description of Group:  Patients were educated about core beliefs and asked to identify one harmful core belief that they have. Patients were asked to explore from where those beliefs originate. Patients were asked to discuss how those beliefs make them feel and the resulting behaviors of those beliefs. They were then be asked if those beliefs are true and, if so, what evidence they have to support them. Lastly, group members were challenged to replace those negative core beliefs with helpful beliefs.   Therapeutic Goals:   1. Patient will identify harmful core beliefs and explore the origins of such beliefs. 2. Patient will identify feelings and behaviors that result from those core beliefs. 3. Patient will discuss whether such beliefs are true. 4.  Patient will replace harmful core beliefs with helpful ones.  Summary of Patient Progress:  Did not attend.   Therapeutic Modalities: Cognitive Behavioral Therapy; Solution-Focused Therapy   Lowry Ram, LCSWA 01/01/2024  2:06 PM

## 2024-01-01 NOTE — Discharge Summary (Signed)
Physician Discharge Summary Note  Patient:  Jeremiah Newman is an 33 y.o., male MRN:  [id number] DOB:  [birth year] Patient phone:  [phone] (home)  Patient address:   [address] [place] [address],  Total Time spent with patient: 45 minutes  Date of Admission:  12/18/2023 Date of Discharge: 01/01/2024  Reason for Admission:  Suicidal and homicidal ideation, command auditory  hallucinations, intrusive thoughts, paranoia  Principal Problem: Schizoaffective disorder, bipolar type [facility]) Discharge Diagnoses: Principal Problem:   Schizoaffective disorder, bipolar type (HCC) Active Problems:   PTSD (post-traumatic stress disorder)   Past Psychiatric History: Multiple previous inpatient hospitalizations, most recent [facility] November 2024. Not established with outpatient. Multiple previous suicide attempts, most recent 1 year ago via hanging. Past trials of Remeron, Lexapro.  Past Medical History: History reviewed. No pertinent past medical history. History reviewed. No pertinent surgical history. Family History: History reviewed. No pertinent family history. Family Psychiatric  History: Significant for substance use; no family history of suicide  Social History:  Social History   Substance and Sexual Activity  Alcohol Use Yes     Social History   Substance and Sexual Activity  Drug Use No    Social History   Socioeconomic History   Marital status: Single    Spouse name: Not on file   Number of children: Not on file   Years of education: Not on file   Highest education level: Not on file  Occupational History   Not on file  Tobacco Use   Smoking status: Some Days    Current packs/day: 1.00    Average packs/day: 1 pack/day for 9.1 years (9.1 ttl pk-yrs)    Types: Cigarettes    Start date: 2016   Smokeless tobacco: Never  Substance and Sexual Activity   Alcohol use: Yes   Drug use: No   Sexual activity: Not Currently  Other Topics Concern   Not on file  Social  History Narrative   Not on file   Social Drivers of Health   Financial Resource Strain: Not on file  Food Insecurity: Food Insecurity Present (12/18/2023)   Hunger Vital Sign    Worried About Running Out of Food in the Last Year: Often true    Ran Out of Food in the Last Year: Often true  Transportation Needs: Unmet Transportation Needs (12/18/2023)   PRAPARE - Administrator, Civil Service (Medical): Yes    Lack of Transportation (Non-Medical): Yes  Physical Activity: Not on file  Stress: Not on file  Social Connections: Not on file    Hospital Course:  Jeremiah Newman is a 33 year old Caucasian male who was admitted on with a history of schizoaffective disorder bipolar type and PTSD who presented voluntarily with reports of suicidal ideation, command auditory hallucinations, intrusive thoughts, and paranoia. States he has not slept in days. Reports worsening symptoms over the past two weeks in the context of medication non-adherence and recent substance use. UDS positive for amphetamine, cocaine, THC. Patient was released from prison in November 2024 and has had adjustment challenges. He has an extensive trauma history beginning in childhood. Currently living with parents. Seeking a structured treatment program.  During hospitalization, the patient received treatment including medication management,  group psychotherapy, psychoeducation, recreation therapy, and nursing care. The patient participated in group sessions and was compliant with treatment.   Pharmacologic management included a trial of quetiapine which was discontinued due to lack of efficacy. Olanzapine was initiated for schizoaffective disorder, Atarax for anxiety, Lithium  for mood stabilization, melatonin for sleep, Protonix for heartburn. The patient showed positive response to medications, noting improved mood, decreased anxiety, and decreased psychotic symptoms. No side effects observed or reported.  Throughout  the hospital course, the patient's mental status significantly improved. His anxiety and paranoia decreased, auditory hallucinations became manageable, and mood stabilized. The patient demonstrated progress in coping skills, communication, and social interactions.  By discharge, the patient exhibited resolution of acute symptoms and was deemed appropriate for discharge to [facility]. The patient was educated on medication adherence, relapse prevention, crisis management, and engaged in safety planning.  The patient will follow up with outpatient therapy and medication management [per [facility]] and has been given a 30-day supply of medications.  Physical Findings: AIMS: Facial and Oral Movements Muscles of Facial Expression: None Lips and Perioral Area: None Jaw: None Tongue: None,Extremity Movements Upper (arms, wrists, hands, fingers): None Lower (legs, knees, ankles, toes): None, Trunk Movements Neck, shoulders, hips: None, Global Judgements Severity of abnormal movements overall : None Incapacitation due to abnormal movements: None Patient's awareness of abnormal movements: No Awareness, Dental Status Current problems with teeth and/or dentures?: No Does patient usually wear dentures?: No Ed[place]ia?: No  CIWA:    COWS:     Musculoskeletal: Strength & Muscle Tone: within normal limits Gait & Station: normal Patient leans: N/A   Psychiatric Specialty Exam:  Presentation  General Appearance:  Appropriate for Environment  Eye Contact: Good  Speech: Clear and Coherent  Speech Volume: Normal  Handedness: Right   Mood and Affect  Mood: Euthymic  Affect: Appropriate; Congruent   Thought Process  Thought Processes: Coherent  Descriptions of Associations:Intact  Orientation:Full (Time, Place and Person)  Thought Content:Logical  History of Schizophrenia/Schizoaffective disorder:Yes  Duration of Psychotic Symptoms:Greater than six  months  Hallucinations:Hallucinations: Auditory (intermittent non-distressing auditory hallucinations)  Ideas of Reference:Paranoia (transient paranoia)  Suicidal Thoughts:Suicidal Thoughts: No  Homicidal Thoughts:Homicidal Thoughts: No   Sensorium  Memory: Immediate Good; Recent Good; Remote Good  Judgment: Good  Insight: Good   Executive Functions  Concentration: Good  Attention Span: Good  Recall: Good  Fund of Knowledge: Good  Language: Good   Psychomotor Activity  Psychomotor Activity: Psychomotor Activity: Normal   Assets  Assets: Communication Skills; Desire for Improvement; Social Support; Physical Health   Sleep  Sleep: Sleep: Good    Physical Exam: Physical Exam Vitals and nursing note reviewed.  Constitutional:      Appearance: Normal appearance. He is normal weight.  HENT:     Head: Normocephalic and atraumatic.  Eyes:     Extraocular Movements: Extraocular movements intact.  Musculoskeletal:        General: Normal range of motion.     Cervical back: Normal range of motion.  Skin:    General: Skin is warm and dry.  Neurological:     Mental Status: He is alert and oriented to person, place, and time.  Psychiatric:        Mood and Affect: Mood normal.        Behavior: Behavior normal.        Thought Content: Thought content normal.        Judgment: Judgment normal.    Review of Systems  Psychiatric/Behavioral:  The patient is nervous/anxious.   All other systems reviewed and are negative.  Blood pressure 106/66, pulse 77, temperature 98.6 F (37 C), resp. rate 20, height 5\' 8"  (1.727 m), weight 83.9 kg, SpO2 96%. Body mass index is 28.12 kg/m.   Social History  Tobacco Use  Smoking Status Some Days   Current packs/day: 1.00   Average packs/day: 1 pack/day for 9.1 years (9.1 ttl pk-yrs)   Types: Cigarettes   Start date: 2016  Smokeless Tobacco Never   Tobacco Cessation:  A prescription for an FDA-approved  tobacco cessation medication provided at discharge   Blood Alcohol level:  Lab Results  Component Value Date   [facility] <10 12/18/2023   ETH <10 10/08/2023    Metabolic Disorder Labs:  Lab Results  Component Value Date   HGBA1C 4.7 (L) 12/23/2023   MPG 88.19 12/23/2023   MPG 93.93 10/10/2023   No results found for: "PROLACTIN" Lab Results  Component Value Date   CHOL 215 (H) 12/23/2023   TRIG 297 (H) 12/23/2023   HDL 40 (L) 12/23/2023   CHOLHDL 5.4 12/23/2023   VLDL 59 (H) 12/23/2023   LDLCALC 116 (H) 12/23/2023   LDLCALC 146 (H) 10/10/2023    See Psychiatric Specialty Exam and Suicide Risk Assessment completed by Attending Physician prior to discharge.  Discharge destination:  [facility]  Is patient on multiple antipsychotic therapies at discharge:  No   Has Patient had three or more failed trials of antipsychotic monotherapy by history:  No  Recommended Plan for Multiple Antipsychotic Therapies: NA  Discharge Instructions     Diet general   Complete by: As directed    Increase activity slowly   Complete by: As directed       Allergies as of 01/01/2024   No Known Allergies      Medication List     STOP taking these medications    escitalopram 10 MG tablet Commonly known as: LEXAPRO   escitalopram 20 MG tablet Commonly known as: LEXAPRO   mirtazapine 7.5 MG tablet Commonly known as: REMERON   QUEtiapine 200 MG tablet Commonly known as: SEROQUEL   traZODone 100 MG tablet Commonly known as: DESYREL       TAKE these medications      Indication  gabapentin 300 MG capsule Commonly known as: NEURONTIN Take 1 capsule (300 mg total) by mouth 3 (three) times daily.  Indication: Panic Disorder   hydrOXYzine 50 MG tablet Commonly known as: ATARAX Take 1 tablet (50 mg total) by mouth 3 (three) times daily as needed for anxiety.  Indication: Feeling Anxious   lithium carbonate 300 MG capsule Take 2 capsules (600 mg total) by mouth daily with supper AND  1 capsule (300 mg total) daily with breakfast.  Indication: Schizoaffective Disorder   melatonin 5 MG Tabs Take 1 tablet (5 mg total) by mouth at bedtime.  Indication: Trouble Sleeping   nicotine 21 mg/24hr patch Commonly known as: NICODERM CQ - dosed in mg/24 hours Place 1 patch (21 mg total) onto the skin daily. Start taking on: January 02, 2024  Indication: Nicotine Addiction   nicotine polacrilex 2 MG gum Commonly known as: NICORETTE Take 1 each (2 mg total) by mouth as needed for smoking cessation.  Indication: Nicotine Addiction   OLANZapine 15 MG tablet Commonly known as: ZYPREXA Take 1 tablet (15 mg total) by mouth 2 (two) times daily.  Indication: MIXED BIPOLAR AFFECTIVE DISORDER   pantoprazole 40 MG tablet Commonly known as: PROTONIX Take 1 tablet (40 mg total) by mouth daily.  Indication: Heartburn        Follow-up Information     [facility] Follow up.   Specialty: Addiction Medicine Why: You will receive your follow up appointments via [facility]. Contact information: [phone] [place] [address] [phone]                - It is recommended to the patient to continue psychiatric medications as prescribed, after discharge from the hospital.   - It is recommended to the patient to follow up with your outpatient psychiatric provider and PCP. - It was discussed with the patient, the impact of alcohol, drugs, tobacco have been there overall psychiatric and medical wellbeing, and total abstinence from substance use was recommended the patient. - Prescriptions provided or sent directly to preferred pharmacy at discharge. Patient agreeable to plan. Given opportunity to ask questions. Appears to feel comfortable with discharge.   - In the event of worsening symptoms, the patient is instructed to call the crisis hotline, 911 and or go to the nearest ED for appropriate evaluation and treatment of symptoms. To follow-up  with primary care provider for other medical issues, concerns and or health care needs - Patient to discharge to [facility].   Signed: Lovette Cliche, NP 01/01/2024, 2:16 PM

## 2024-01-01 NOTE — Progress Notes (Signed)
   01/01/24 0834  Psych Admission Type (Psych Patients Only)  Admission Status Voluntary  Psychosocial Assessment  Patient Complaints Sadness  Eye Contact Fair  Facial Expression Animated  Affect Appropriate to circumstance  Speech Logical/coherent  Interaction Assertive  Motor Activity Other (Comment) (WDL)  Appearance/Hygiene Unremarkable  Behavior Characteristics Appropriate to situation;Cooperative  Mood Pleasant  Thought Process  Coherency WDL  Content WDL  Delusions None reported or observed  Perception WDL  Hallucination None reported or observed  Judgment Impaired  Confusion None  Danger to Self  Current suicidal ideation? Denies  Agreement Not to Harm Self Yes  Description of Agreement Verbal  Danger to Others  Danger to Others None reported or observed

## 2024-01-01 NOTE — [facility] Counselor (Signed)
CSW touched base with [facility] on patient's behalf.   Patient has been APPROVED.   CSW communicated this with team. Team has been given possible admission times to plan discharge around.   CSW to let [facility] intake team know once a decision is made.    CSW team to continue to assess.    Reymundo Poll, MSW, LCSWA 01/01/2024 12:38 PM

## 2024-01-01 NOTE — Group Note (Signed)
Date:  01/01/2024 Time:  4:55 PM  Group Topic/Focus:  ART THERAPY focus  helps provide a way to express emotions and experiences not easily expressed in words. It is not about the final product; it is about healing through the process of making art.    Participation Level:  Active  Participation Quality:  Appropriate and Attentive  Affect:  Appropriate and Excited  Cognitive:  Alert, Appropriate, and Oriented  Insight: Appropriate  Engagement in Group:  Developing/Improving and Engaged  Modes of Intervention:  Activity, Discussion, and Socialization  Additional Comments:    Rosaura Carpenter 01/01/2024, 4:55 PM

## 2024-01-01 NOTE — Progress Notes (Signed)
  [facility] Adult Case Management Discharge Plan :  Will you be returning to the same living situation after discharge:  No.Patient to go to inpatient treatment at [facility].  At discharge, do you have transportation home?: Yes,  CSW to coordinate transportation on patient's behalf.  Do you have the ability to pay for your medications:  Yes, [facility]   Release of information consent forms completed and in the chart;  Patient's signature needed at discharge.  Patient to Follow up at:  Follow-up Information     [facility] Follow up.   Specialty: Addiction Medicine Why: You will receive your follow up appointments via [facility]. Contact information: [address] [place] [address] [phone]                 Next level of care provider has access to [facility] Link:no  Safety Planning and Suicide Prevention discussed: No.Patient declined. SPE completed with pt, as pt refused to consent to family contact. SPI pamphlet provided to pt and pt was encouraged to share information with support network, ask questions, and talk about any concerns relating to SPE. Pt denies access to guns/firearms and verbalized understanding of information provided. Mobile Crisis information also provided to pt.       Has patient been referred to the Quitline?: Patient refused referral for treatment  Patient has been referred for addiction treatment: Patient to attend inpatient substance treatment with [facility].   Lowry Ram, LCSW 01/01/2024, 2:14 PM

## 2024-01-01 NOTE — [facility] Suicide Risk Assessment (Signed)
Suicide Risk Assessment  Discharge Assessment    [facility] Discharge Suicide Risk Assessment   Principal Problem: Schizoaffective disorder, bipolar type [facility]) Discharge Diagnoses: Principal Problem:   Schizoaffective disorder, bipolar type (HCC) Active Problems:   Suicidal ideation   PTSD (post-traumatic stress disorder)   Insomnia   Paranoia (psychosis) (HCC)   Total Time spent with patient: 45 minutes  Musculoskeletal: Strength & Muscle Tone: within normal limits Gait & Station: normal Patient leans: N/A  Psychiatric Specialty Exam  Presentation  General Appearance:  Appropriate for Environment  Eye Contact: Good  Speech: Clear and Coherent  Speech Volume: Normal  Handedness: Right   Mood and Affect  Mood: Euthymic  Duration of Depression Symptoms: Greater than two weeks  Affect: Appropriate; Congruent   Thought Process  Thought Processes: Coherent  Descriptions of Associations:Intact  Orientation:Full (Time, Place and Person)  Thought Content:Logical  History of Schizophrenia/Schizoaffective disorder:Yes  Duration of Psychotic Symptoms:Greater than six months  Hallucinations:Hallucinations: Auditory (intermittent non-distressing auditory hallucinations)  Ideas of Reference:Paranoia (transient paranoia)  Suicidal Thoughts:Suicidal Thoughts: No  Homicidal Thoughts:Homicidal Thoughts: No   Sensorium  Memory: Immediate Good; Recent Good; Remote Good  Judgment: Good  Insight: Good   Executive Functions  Concentration: Good  Attention Span: Good  Recall: Good  Fund of Knowledge: Good  Language: Good   Psychomotor Activity  Psychomotor Activity: Psychomotor Activity: Normal   Assets  Assets: Communication Skills; Desire for Improvement; Social Support; Physical Health   Sleep  Sleep: Sleep: Good   Physical Exam: Physical Exam ROS Blood pressure 106/66, pulse 77, temperature 98.6 F (37 C), resp. rate 20,  height 5\' 8"  (1.727 m), weight 83.9 kg, SpO2 96%. Body mass index is 28.12 kg/m.  Mental Status Per Nursing Assessment::   On Admission:  Suicidal ideation indicated by patient  Demographic Factors:  Male, Adolescent or young adult, Caucasian, Low socioeconomic status, and Unemployed  Loss Factors: Financial problems/change in socioeconomic status  Historical Factors: Prior suicide attempts and Family history of mental illness or substance abuse  Risk Reduction Factors:   Responsible for children under 37 years of age, Sense of responsibility to family, Positive social support, and Positive coping skills or problem solving skills  Continued Clinical Symptoms:  Severe Anxiety and/or Agitation Bipolar Disorder:   Mixed State Alcohol/Substance Abuse/Dependencies Schizophrenia:   Less than 76 years old More than one psychiatric diagnosis Previous Psychiatric Diagnoses and Treatments  Cognitive Features That Contribute To Risk:  None    Suicide Risk:  Mild:  Suicidal ideation of limited frequency, intensity, duration, and specificity.  There are no identifiable plans, no associated intent, mild dysphoria and related symptoms, good self-control (both objective and subjective assessment), few other risk factors, and identifiable protective factors, including available and accessible social support.   Follow-up Information     [facility] Follow up.   Specialty: Addiction Medicine Why: You will receive your follow up appointments via [facility]. Contact information: [address] [place] [address] [phone]                 Plan Of Care/Follow-up recommendations:  It is recommended to the patient to continue psychiatric medications as prescribed, after discharge from the hospital.   - It is recommended to the patient to follow up with your outpatient psychiatric provider and PCP. - It was discussed with the patient, the impact of  alcohol, drugs, tobacco have been there overall psychiatric and medical wellbeing, and total abstinence from substance use was recommended  the patient. -30-day supply of prescriptions provided. Patient agreeable to plan. Given opportunity to ask questions. Appears to feel comfortable with discharge.   - In the event of worsening symptoms, the patient is instructed to call the crisis hotline, 911 and or go to the nearest ED for appropriate evaluation and treatment of symptoms. To follow-up with primary care provider for other medical issues, concerns and or health care needs   Jeremiah Newman Jeremiah Laird Runnion, NP 01/01/2024, 1:58 PM

## 2024-01-01 NOTE — Group Note (Signed)
Date:  01/01/2024 Time:  5:05 PM  Group Topic/Focus:  Outdoor Recreation Therapy is a form of psychotherapy that utilizes outdoor activities to promote physical, social, and psychological well-being    Participation Level:  Active  Participation Quality:  Appropriate  Affect:  Appropriate  Cognitive:  Appropriate  Insight: Appropriate  Engagement in Group:  Developing/Improving  Modes of Intervention:  Activity  Additional Comments:    Jeremiah Newman 01/01/2024, 5:05 PM

## 2024-01-01 NOTE — Progress Notes (Signed)
Patient discharged home. DC instructions provided and explained. Belongings returned to include medications. Patient Denies SI, HI, AVH in no distress.

## 2024-01-06 NOTE — Progress Notes (Signed)
[facility] MD Progress Note  12/31/2023 1500 CLIDE REMMERS  MRN:  [id number]  Subjective:  Jansen Sciuto is a 33 year old Caucasian male with a history of schizoaffective disorder bipolar type and PTSD who presented voluntarily with reports of suicidal ideation, command auditory hallucinations, intrusive thoughts, and paranoia.   Chart reviewed, case discussed with multidisciplinary team, patient seen on rounds. No acute events overnight. Seeley reports feeling discouraged regarding discharge planning today. Has concerns about the possibility of having to return home with parents instead of a structure program. He is doing well on the unit, engaging with peers, attending some groups. He continues to report significant anxiety and hypervigilance but copes by taking breaks in his room. Voices are not causing any distress. He is adherent with medication regimen and denies side effects. Denies thoughts of harming self and others.  Principal Problem: Schizoaffective disorder, bipolar type (HCC) Diagnosis: Principal Problem:   Schizoaffective disorder, bipolar type (HCC) Active Problems:   PTSD (post-traumatic stress disorder)  Total Time spent with patient: 20 minutes  Past Psychiatric History: see h&p  Past Medical History: History reviewed. No pertinent past medical history. History reviewed. No pertinent surgical history. Family History: History reviewed. No pertinent family history. Family Psychiatric  History: see h&p Social History:  Social History   Substance and Sexual Activity  Alcohol Use Yes     Social History   Substance and Sexual Activity  Drug Use No    Social History   Socioeconomic History   Marital status: Single    Spouse name: Not on file   Number of children: Not on file   Years of education: Not on file   Highest education level: Not on file  Occupational History   Not on file  Tobacco Use   Smoking status: Some Days    Current packs/day: 1.00    Average  packs/day: 1 pack/day for 9.1 years (9.1 ttl pk-yrs)    Types: Cigarettes    Start date: 2016   Smokeless tobacco: Never  Substance and Sexual Activity   Alcohol use: Yes   Drug use: No   Sexual activity: Not Currently  Other Topics Concern   Not on file  Social History Narrative   Not on file   Social Drivers of Health   Financial Resource Strain: Not on file  Food Insecurity: Food Insecurity Present (12/18/2023)   Hunger Vital Sign    Worried About Running Out of Food in the Last Year: Often true    Ran Out of Food in the Last Year: Often true  Transportation Needs: Unmet Transportation Needs (12/18/2023)   PRAPARE - Administrator, Civil Service (Medical): Yes    Lack of Transportation (Non-Medical): Yes  Physical Activity: Not on file  Stress: Not on file  Social Connections: Not on file   Additional Social History:                         Sleep: Good  Appetite:  Good  Current Medications: No current facility-administered medications for this encounter.   Current Outpatient Medications  Medication Sig Dispense Refill   gabapentin (NEURONTIN) 300 MG capsule Take 1 capsule (300 mg total) by mouth 3 (three) times daily. 90 capsule 0   hydrOXYzine (ATARAX) 50 MG tablet Take 1 tablet (50 mg total) by mouth 3 (three) times daily as needed for anxiety. 30 tablet 0   lithium carbonate 300 MG capsule Take 2 capsules (600 mg total) by mouth  daily with supper AND 1 capsule (300 mg total) daily with breakfast. 90 capsule 0   melatonin 5 MG TABS Take 1 tablet (5 mg total) by mouth at bedtime. 30 tablet 0   nicotine (NICODERM CQ - DOSED IN MG/24 HOURS) 21 mg/24hr patch Place 1 patch (21 mg total) onto the skin daily. 28 patch 0   nicotine polacrilex (NICORETTE) 2 MG gum Take 1 each (2 mg total) by mouth as needed for smoking cessation. 100 tablet 0   OLANZapine (ZYPREXA) 15 MG tablet Take 1 tablet (15 mg total) by mouth 2 (two) times daily. 60 tablet 0    pantoprazole (PROTONIX) 40 MG tablet Take 1 tablet (40 mg total) by mouth daily. 30 tablet 0    Lab Results: No results found for this or any previous visit (from the past 48 hours).  Blood Alcohol level:  Lab Results  Component Value Date   ETH <10 12/18/2023   ETH <10 10/08/2023    Metabolic Disorder Labs: Lab Results  Component Value Date   HGBA1C 4.7 (L) 12/23/2023   MPG 88.19 12/23/2023   MPG 93.93 10/10/2023   No results found for: "PROLACTIN" Lab Results  Component Value Date   CHOL 215 (H) 12/23/2023   TRIG 297 (H) 12/23/2023   HDL 40 (L) 12/23/2023   CHOLHDL 5.4 12/23/2023   VLDL 59 (H) 12/23/2023   LDLCALC 116 (H) 12/23/2023   LDLCALC 146 (H) 10/10/2023    Physical Findings: AIMS: Facial and Oral Movements Muscles of Facial Expression: None Lips and Perioral Area: None Jaw: None Tongue: None,Extremity Movements Upper (arms, wrists, hands, fingers): None Lower (legs, knees, ankles, toes): None, Trunk Movements Neck, shoulders, hips: None, Global Judgements Severity of abnormal movements overall : None Incapacitation due to abnormal movements: None Patient's awareness of abnormal movements: No Awareness, Dental Status Current problems with teeth and/or dentures?: No Does patient usually wear dentures?: No Edentia?: No  CIWA:    COWS:     Musculoskeletal: Strength & Muscle Tone: within normal limits Gait & Station: normal Patient leans: N/A  Psychiatric Specialty Exam:  Presentation  General Appearance:  Appropriate for Environment  Eye Contact: Good  Speech: Clear and Coherent  Speech Volume: Normal  Handedness: Right   Mood and Affect  Mood: "Good"  Affect: Appropriate; Congruent   Thought Process  Thought Processes: Coherent  Descriptions of Associations:Intact  Orientation:Full (Time, Place and Person)  Thought Content:Logical  History of Schizophrenia/Schizoaffective disorder:Yes  Duration of Psychotic  Symptoms:Greater than six months  Hallucinations:auditory hallucinations  Ideas of Reference:Paranoia (transient paranoia)  Suicidal Thoughts: Denies  Homicidal Thoughts:Denies   Sensorium  Memory: Immediate Good; Recent Good; Remote Good  Judgment: Good  Insight: Good   Executive Functions  Concentration: Good  Attention Span: Good  Recall: Good  Fund of Knowledge: Good  Language: Good   Psychomotor Activity  Psychomotor Activity:Normal   Assets  Assets: Communication Skills; Desire for Improvement; Social Support; Physical Health   Sleep  Sleep:Good   Physical Exam: Physical Exam Vitals and nursing note reviewed.  HENT:     Nose: Nose normal.  Pulmonary:     Effort: Pulmonary effort is normal.  Musculoskeletal:        General: Normal range of motion.     Cervical back: Normal range of motion.  Skin:    General: Skin is warm and dry.  Neurological:     Mental Status: He is alert.    Review of Systems  Psychiatric/Behavioral:  Positive for  hallucinations. The patient is nervous/anxious.   All other systems reviewed and are negative.  Blood pressure 106/66, pulse 77, temperature 98.6 F (37 C), resp. rate 20, height 5\' 8"  (1.727 m), weight 83.9 kg, SpO2 96%. Body mass index is 28.12 kg/m.   Treatment Plan Summary:  Daily contact with patient to assess and evaluate symptoms and progress in treatment and Medication management. Patient's case to be discussed in multi-disciplinary team meeting.   Observation Level/Precautions:  Voluntary admission; 15 minute checks; Precautions: assault, suicide  Laboratory:   labs reviewed; lithium level, A1c, lipid panel, TSH, RPR on 12/23/23  Psychotherapy:  recommend individual and group therapy  Medications:  see below  Consultations:  social work for discharge planning  Discharge Concerns:  need to safety plan (access to father's firearms); medication compliance and effectiveness; substance use   Estimated LOS: 14-21 days  Discharge planning: patient seeking long-term substance use treatment program; follow-up with PCP for HLD    - Increase Lithium 300mg  PO daily with breakfast and 600mg  PO daily with supper (lithium level 0.14 on 12/23/23) - Continue hydroxyzine 25mg  PO tid prn anxiety, started 12/18/23 - Continue gabapentin 300mg  PO TID for anxiety (off-label), started 1/16 - Continue olanzapine 15mg  PO daily for schizoaffective disorder - Continue melatonin 5mg  PO at bedtime for sleep --  The risks/benefits/side-effects/alternatives to this medication were discussed in detail with the patient and time was given for questions. The patient consents to medication trial.              -- Metabolic profile and EKG monitoring obtained while on an atypical antipsychotic (QTc: 433)              -- Encouraged patient to participate in unit milieu and in scheduled group therapies     Long Term Goal(s): Improvement in symptoms so as ready for discharge   Short Term Goals: Ability to identify changes in lifestyle to reduce recurrence of condition will improve, Ability to verbalize feelings will improve, Ability to disclose and discuss suicidal ideas, Ability to demonstrate self-control will improve, Ability to identify and develop effective coping behaviors will improve, Compliance with prescribed medications will improve, and Ability to identify triggers associated with substance abuse/mental health issues will improve  Koehn Salehi Robyne Peers, NP

## 2024-01-30 ENCOUNTER — Encounter: Payer: Self-pay | Admitting: Psychiatry

## 2024-01-30 ENCOUNTER — Emergency Department
Admission: EM | Admit: 2024-01-30 | Discharge: 2024-01-30 | Disposition: A | Discharge status: Other Acute Inpatient Hospital | Payer: 59 | Attending: Emergency Medicine | Admitting: Emergency Medicine

## 2024-01-30 ENCOUNTER — Other Ambulatory Visit: Payer: Self-pay

## 2024-01-30 ENCOUNTER — Inpatient Hospital Stay
Admission: AD | Admit: 2024-01-30 | Discharge: 2024-02-07 | DRG: 885 | Disposition: A | Discharge status: Home / Self Care | Payer: 59 | Source: Intra-hospital | Attending: Psychiatry | Admitting: Psychiatry

## 2024-01-30 DIAGNOSIS — R45851 Suicidal ideations: Secondary | ICD-10-CM | POA: Insufficient documentation

## 2024-01-30 DIAGNOSIS — F17290 Nicotine dependence, other tobacco product, uncomplicated: Secondary | ICD-10-CM | POA: Diagnosis present

## 2024-01-30 DIAGNOSIS — R440 Auditory hallucinations: Secondary | ICD-10-CM | POA: Insufficient documentation

## 2024-01-30 DIAGNOSIS — R519 Headache, unspecified: Secondary | ICD-10-CM | POA: Diagnosis not present

## 2024-01-30 DIAGNOSIS — F410 Panic disorder [episodic paroxysmal anxiety] without agoraphobia: Secondary | ICD-10-CM | POA: Diagnosis present

## 2024-01-30 DIAGNOSIS — Z9151 Personal history of suicidal behavior: Secondary | ICD-10-CM

## 2024-01-30 DIAGNOSIS — Z5982 Transportation insecurity: Secondary | ICD-10-CM

## 2024-01-30 DIAGNOSIS — Z5941 Food insecurity: Secondary | ICD-10-CM | POA: Diagnosis not present

## 2024-01-30 DIAGNOSIS — F17210 Nicotine dependence, cigarettes, uncomplicated: Secondary | ICD-10-CM | POA: Diagnosis present

## 2024-01-30 DIAGNOSIS — F19959 Other psychoactive substance use, unspecified with psychoactive substance-induced psychotic disorder, unspecified: Secondary | ICD-10-CM | POA: Diagnosis present

## 2024-01-30 DIAGNOSIS — F5104 Psychophysiologic insomnia: Secondary | ICD-10-CM | POA: Diagnosis present

## 2024-01-30 DIAGNOSIS — F250 Schizoaffective disorder, bipolar type: Secondary | ICD-10-CM

## 2024-01-30 DIAGNOSIS — F29 Unspecified psychosis not due to a substance or known physiological condition: Secondary | ICD-10-CM | POA: Diagnosis present

## 2024-01-30 DIAGNOSIS — Z79899 Other long term (current) drug therapy: Secondary | ICD-10-CM | POA: Diagnosis not present

## 2024-01-30 DIAGNOSIS — Z91148 Patient's other noncompliance with medication regimen for other reason: Secondary | ICD-10-CM | POA: Diagnosis not present

## 2024-01-30 DIAGNOSIS — F259 Schizoaffective disorder, unspecified: Secondary | ICD-10-CM

## 2024-01-30 DIAGNOSIS — G8929 Other chronic pain: Secondary | ICD-10-CM | POA: Insufficient documentation

## 2024-01-30 DIAGNOSIS — R4587 Impulsiveness: Secondary | ICD-10-CM | POA: Diagnosis present

## 2024-01-30 DIAGNOSIS — Z818 Family history of other mental and behavioral disorders: Secondary | ICD-10-CM

## 2024-01-30 DIAGNOSIS — F4310 Post-traumatic stress disorder, unspecified: Secondary | ICD-10-CM | POA: Diagnosis present

## 2024-01-30 HISTORY — DX: Schizophrenia, unspecified: F20.9

## 2024-01-30 LAB — COMPREHENSIVE METABOLIC PANEL
ALT: 22 U/L (ref 0–44)
AST: 23 U/L (ref 15–41)
Albumin: 4.4 g/dL (ref 3.5–5.0)
Alkaline Phosphatase: 69 U/L (ref 38–126)
Anion gap: 11 (ref 5–15)
BUN: 16 mg/dL (ref 6–20)
CO2: 19 mmol/L — ABNORMAL LOW (ref 22–32)
Calcium: 9.1 mg/dL (ref 8.9–10.3)
Chloride: 103 mmol/L (ref 98–111)
Creatinine, Ser: 0.93 mg/dL (ref 0.61–1.24)
GFR, Estimated: >60 mL/min (ref >60)
Glucose, Bld: 103 mg/dL — ABNORMAL HIGH (ref 70–99)
Potassium: 3.6 mmol/L (ref 3.5–5.1)
Sodium: 133 mmol/L — ABNORMAL LOW (ref 135–145)
Total Bilirubin: 0.3 mg/dL (ref 0.0–1.2)
Total Protein: 7.5 g/dL (ref 6.5–8.1)

## 2024-01-30 LAB — URINE DRUG SCREEN, QUALITATIVE (ARMC ONLY)
Amphetamines, Ur Screen: NOT DETECTED
Barbiturates, Ur Screen: NOT DETECTED
Benzodiazepine, Ur Scrn: NOT DETECTED
Cannabinoid 50 Ng, Ur ~~LOC~~: NOT DETECTED
Cocaine Metabolite,Ur ~~LOC~~: NOT DETECTED
MDMA (Ecstasy)Ur Screen: NOT DETECTED
Methadone Scn, Ur: NOT DETECTED
Opiate, Ur Screen: NOT DETECTED
Phencyclidine (PCP) Ur S: NOT DETECTED
Tricyclic, Ur Screen: NOT DETECTED

## 2024-01-30 LAB — CBC WITH DIFFERENTIAL/PLATELET
Abs Immature Granulocytes: 0.13 10*3/uL — ABNORMAL HIGH (ref 0.00–0.07)
Basophils Absolute: 0 10*3/uL (ref 0.0–0.1)
Basophils Relative: 0 %
Eosinophils Absolute: 0 10*3/uL (ref 0.0–0.5)
Eosinophils Relative: 0 %
HCT: 40.1 % (ref 39.0–52.0)
Hemoglobin: 14.2 g/dL (ref 13.0–17.0)
Immature Granulocytes: 1 %
Lymphocytes Relative: 6 %
Lymphs Abs: 0.9 10*3/uL (ref 0.7–4.0)
MCH: 29.5 pg (ref 26.0–34.0)
MCHC: 35.4 g/dL (ref 30.0–36.0)
MCV: 83.2 fL (ref 80.0–100.0)
Monocytes Absolute: 0.9 10*3/uL (ref 0.1–1.0)
Monocytes Relative: 6 %
Neutro Abs: 12.1 10*3/uL — ABNORMAL HIGH (ref 1.7–7.7)
Neutrophils Relative %: 87 %
Platelets: 292 10*3/uL (ref 150–400)
RBC: 4.82 MIL/uL (ref 4.22–5.81)
RDW: 12.3 % (ref 11.5–15.5)
WBC: 14 10*3/uL — ABNORMAL HIGH (ref 4.0–10.5)
nRBC: 0 % (ref 0.0–0.2)

## 2024-01-30 LAB — SALICYLATE LEVEL: Salicylate Lvl: <7 mg/dL — ABNORMAL LOW (ref 7.0–30.0)

## 2024-01-30 LAB — ETHANOL: Alcohol, Ethyl (B): <10 mg/dL (ref <10)

## 2024-01-30 LAB — ACETAMINOPHEN LEVEL: Acetaminophen (Tylenol), Serum: <10 ug/mL — ABNORMAL LOW (ref 10–30)

## 2024-01-30 MED ORDER — LORAZEPAM 2 MG/ML IJ SOLN: 2.0000 mg | Freq: Three times a day (TID) | INTRAMUSCULAR | Status: DC | PRN

## 2024-01-30 MED ORDER — METOCLOPRAMIDE HCL 10 MG PO TABS
10.0000 mg | ORAL_TABLET | Freq: Once | ORAL | Status: AC
  Administered 2024-01-30: 10 mg via ORAL
  Filled 2024-01-30: qty 1

## 2024-01-30 MED ORDER — DIPHENHYDRAMINE HCL 25 MG PO CAPS
25.0000 mg | ORAL_CAPSULE | Freq: Once | ORAL | Status: AC
  Administered 2024-01-30: 25 mg via ORAL
  Filled 2024-01-30: qty 1

## 2024-01-30 MED ORDER — HYDROXYZINE HCL 25 MG PO TABS: 50.0000 mg | ORAL_TABLET | Freq: Three times a day (TID) | ORAL | Status: DC | PRN

## 2024-01-30 MED ORDER — DIPHENHYDRAMINE HCL 25 MG PO CAPS: 50.0000 mg | ORAL_CAPSULE | Freq: Three times a day (TID) | ORAL | Status: DC | PRN

## 2024-01-30 MED ORDER — OLANZAPINE 5 MG PO TABS: 15.0000 mg | ORAL_TABLET | Freq: Every day | ORAL | Status: DC

## 2024-01-30 MED ORDER — KETOROLAC TROMETHAMINE 30 MG/ML IJ SOLN
30.0000 mg | Freq: Once | INTRAMUSCULAR | Status: AC
  Administered 2024-01-30: 30 mg via INTRAMUSCULAR
  Filled 2024-01-30: qty 1

## 2024-01-30 MED ORDER — ACETAMINOPHEN 500 MG PO TABS
1000.0000 mg | ORAL_TABLET | Freq: Once | ORAL | Status: AC
  Administered 2024-01-30: 1000 mg via ORAL
  Filled 2024-01-30: qty 2

## 2024-01-30 MED ORDER — ACETAMINOPHEN 325 MG PO TABS: 650.0000 mg | ORAL_TABLET | Freq: Four times a day (QID) | ORAL | Status: DC | PRN

## 2024-01-30 MED ORDER — DIPHENHYDRAMINE HCL 50 MG/ML IJ SOLN: 50.0000 mg | Freq: Three times a day (TID) | INTRAMUSCULAR | Status: DC | PRN

## 2024-01-30 MED ORDER — MAGNESIUM HYDROXIDE 400 MG/5ML PO SUSP: 30.0000 mL | Freq: Every day | ORAL | Status: DC | PRN

## 2024-01-30 MED ORDER — HALOPERIDOL LACTATE 5 MG/ML IJ SOLN: 5.0000 mg | Freq: Three times a day (TID) | INTRAMUSCULAR | Status: DC | PRN

## 2024-01-30 MED ORDER — HALOPERIDOL LACTATE 5 MG/ML IJ SOLN
INTRAMUSCULAR | Status: AC
  Filled 2024-01-30: qty 1

## 2024-01-30 MED ORDER — HALOPERIDOL 5 MG PO TABS: 5.0000 mg | ORAL_TABLET | Freq: Three times a day (TID) | ORAL | Status: DC | PRN

## 2024-01-30 MED ORDER — OLANZAPINE 5 MG PO TABS
15.0000 mg | ORAL_TABLET | Freq: Every day | ORAL | Status: DC
  Administered 2024-01-30: 15 mg via ORAL
  Filled 2024-01-30: qty 1

## 2024-01-30 MED ORDER — HYDROXYZINE HCL 50 MG PO TABS
50.0000 mg | ORAL_TABLET | Freq: Three times a day (TID) | ORAL | Status: DC | PRN
  Administered 2024-01-31: 50 mg via ORAL
  Filled 2024-01-30: qty 1

## 2024-01-30 MED ORDER — ALUM & MAG HYDROXIDE-SIMETH 200-200-20 MG/5ML PO SUSP: 30.0000 mL | ORAL | Status: DC | PRN

## 2024-01-30 MED ORDER — HALOPERIDOL LACTATE 5 MG/ML IJ SOLN: 10.0000 mg | Freq: Three times a day (TID) | INTRAMUSCULAR | Status: DC | PRN

## 2024-01-30 NOTE — BH Assessment (Addendum)
 Comprehensive Clinical Assessment (CCA) Screening, Triage and Referral Note  01/30/2024 Jeremiah Newman [id number] Jeremiah Newman is a 33 year old, English speaking, White, Non-Hispanic male with a hx of Alcohol abuse, cocaine abuse, PTSD, and schizoaffective disorder, bipolar type. Pt presented to [facility] ED voluntarily for an evaluation. Per triage note BIB [facility] from home. Pt called due to chronic headache that escalated tonight, making him "suicidal" w/ thoughts of overdosing on Seroquel. Reports chronic auditory hallucinations. Denies SI  When asking about what brought pt. to the ED, pt. states " I'm hearing voices; I'm on Lithium, hearing things I want to kill myself really bad; I'm scared." Pt reported that he is constantly paranoid due to the constant voices whispering to him, telling him that they'll cause him harm. Pt reported that his mind can never rest due to his extreme paranoia. Pt reported that he ducks from windows, insists that his father goes to the door with a gun, and looks under beds. Pt complained of having excruciating head pain. Pt reported that he has not been sleeping or eating. Pt reported that he lives with his parents who are supportive; however, pt worries about driving them up the wall. Pt endorsed having symptoms of depression. Pt repeatedly endorsed thoughts of SI and having auditory/visual hallucinations throughout the interview. Pt expressed a strong desire to overdose on his Remeron or Seroquel, explaining that he has plenty of them. Pt had a concrete plan with intent. Pt reported having previous suicide attempts, including attempting to hang himself and overdosing. Pt reported that he does not want to live with his symptoms because it is no way to live. Pt explained that he currently feels that at any second someone is going to kill him. Pt presented with clear and coherent speech. Pt had an anxious mood and a depressed affect. The patient denied current HI. The pt  reported taking a shot of alcohol 2:30 AM.  Risk Factors: Current ideation, intent, plan  Alcohol / Substance abuse   Access to means  Hopelessness  Previous suicide ideation or attempts  Current or previous history of psychiatric diagnosis  Recent discharge from an inpatient psychiatric unit  Protective Factors:  Positive social support  Spirituality  Flowsheet Row ED from 01/30/2024 in [facility] Emergency Department at [facility] Most recent reading at 01/30/2024  4:39 AM Admission (Discharged) from 12/18/2023 in [facility] INPATIENT BEHAVIORAL MEDICINE Most recent reading at 12/18/2023  4:32 PM ED from 12/18/2023 in [facility] Emergency Department at [facility] Most recent reading at 12/18/2023 12:17 PM  C-SSRS RISK CATEGORY High Risk High Risk High Risk       Chief Complaint:  Chief Complaint  Patient presents with   Headache   Suicidal   Visit Diagnosis: Schizoaffective disorder, bipolar type (HCC) Active Problems:   PTSD (post-traumatic stress disorder)   Patient Reported Information How did you hear about [place]? Self  What Is the Reason for Your Visit/Call Today? Pt reports having hallucinations, anxiety, and SI without a plan.  How Long Has This Been Causing You Problems? 1 wk - 1 month  What Do You Feel Would Help You the Most Today? Alcohol or Drug Use Treatment; Treatment for Depression or other mood problem   Have You Recently Had Any Thoughts About Hurting Yourself? Yes  Are You Planning to Commit Suicide/Harm Yourself At This time? No   Have you Recently Had Thoughts About Hurting Someone [name]? No  Are You Planning to Harm Someone at This Time? No  Explanation: None   Have You Used Any Alcohol or Drugs in the Past 24 Hours? Yes  How Long Ago Did You Use Drugs or Alcohol? week ago  What Did You Use and How Much? methamphetamines, fentanyl   Do You Currently Have a Therapist/Psychiatrist? No  Name of Therapist/Psychiatrist: n/a   Have You Been  Recently Discharged From Any Office Practice or Programs? No  Explanation of Discharge From Practice/Program: Pt was released from prison almost 1 month ago (October 11)    CCA Screening Triage Referral Assessment Type of Contact: Face-to-Face  Telemedicine Service Delivery:   Is this Initial or Reassessment?   Date Telepsych consult ordered in [facility]:    Time Telepsych consult ordered in [facility]:    Location of Assessment: [facility] ED  Provider Location: [facility] ED    Collateral Involvement: No collateral involved   Does Patient Have a Court Appointed Legal Guardian? No data recorded Name and Contact of Legal Guardian: No data recorded If Minor and Not Living with Parent(s), Who has Custody? n/a  Is CPS involved or ever been involved? Never  Is APS involved or ever been involved? Never   Patient Determined To Be At Risk for Harm To Self or Others Based on Review of Patient Reported Information or Presenting Complaint? Yes, for Self-Harm  Method: No Plan  Availability of Means: No access or NA  Intent: Vague intent or NA  Notification Required: No need or identified person  Additional Information for Danger to Others Potential: -- (n/a)  Additional Comments for Danger to Others Potential: none  Are There Guns or Other Weapons in Your Home? Yes  Types of Guns/Weapons: Pt reports his father have two or three guns in the home  Are These Weapons Safely Secured?                            -- (N/A)  Who Could Verify You Are Able To Have These Secured: N/A  Do You Have any Outstanding Charges, Pending Court Dates, Parole/Probation? No  Contacted To Inform of Risk of Harm To Self or Others: Other: Comment (No need notify)   Does Patient Present under Involuntary Commitment? No    [place] of Residence: [place]   Patient Currently Receiving the Following Services: Medication Management   Determination of Need: Urgent (48 hours)   Options For Referral: Inpatient  Hospitalization   Disposition Recommendation per psychiatric provider:   Foy [place], LCAS

## 2024-01-30 NOTE — ED Triage Notes (Signed)
 BIB [facility] from home. Pt called due to chronic headache that escalated tonight, making him "suicidal" w/ thoughts of overdosing on Seroquel. Reports chronic auditory hallucinations. Denies SI

## 2024-01-30 NOTE — ED Notes (Signed)
 Lunch tray offered to pt. Pt declined lunch at this time.

## 2024-01-30 NOTE — Group Note (Signed)
 Date:  01/30/2024 Time:  9:53 PM  Group Topic/Focus:  Wrap-Up Group:   The focus of this group is to help patients review their daily goal of treatment and discuss progress on daily workbooks.    Participation Level:  Did Not Attend  Participation Quality:   none  Affect:   none  Cognitive:   none  Insight: None  Engagement in Group:   none  Modes of Intervention:   none  Additional Comments:  none   [name] 01/30/2024, 9:53 PM

## 2024-01-30 NOTE — ED Notes (Signed)
 RN as witness to pt changing into hospital clothing. Belongings include socks, boots, pants, t shirt, cell phone, ring and bracelet. Labeled with pt name

## 2024-01-30 NOTE — ED Notes (Signed)
 VOL to go to [facility]

## 2024-01-30 NOTE — ED Notes (Signed)
 Pt returned to stretcher with TTS. Pt alert and calm at this time.

## 2024-01-30 NOTE — Consult Note (Signed)
 [facility] Psychiatric Consult Initial  Patient Name: .Jeremiah Newman  MRN: [id number]  DOB: Sep 03, 1991  Consult Order details:  Orders (From admission, onward)     Start     Ordered   01/30/24 0439  IP CONSULT TO PSYCHIATRY       Ordering Provider: Pilar Jarvis, MD  Provider:  (Not yet assigned)  Question Answer Comment  Place call to: ED   Reason for Consult Admit      01/30/24 0439   01/30/24 0439  CONSULT TO CALL ACT TEAM       Ordering Provider: Pilar Jarvis, MD  Provider:  (Not yet assigned)  Question:  Reason for Consult?  Answer:  Psych consult   01/30/24 0439             Mode of Visit: Tele-visit Virtual Statement:TELE PSYCHIATRY ATTESTATION & CONSENT As the provider for this telehealth consult, I attest that I verified the patient's identity using two separate identifiers, introduced myself to the patient, provided my credentials, disclosed my location, and performed this encounter via a HIPAA-compliant, real-time, face-to-face, two-way, interactive audio and video platform and with the full consent and agreement of the patient (or guardian as applicable.) Patient physical location: [facility] ED. Telehealth provider physical location: home office in state of [place].   Video start time: 11:55 AM Video end time: 12:15 PM    Psychiatry Consult Evaluation  Service Date: January 30, 2024 LOS:  LOS: 0 days  Chief Complaint "My mom and dad wanted me to come to the hospital becaue I keep waking them up. I see things and hear voices. Voices say that they are going to get me and that I should kill myself". "If they get me..... they peak their head around the corner dresed in all back then they're gone". Visual hallucinations last seen by patient before arrvingin to the ED. "I been hearing voices for a long time."  Primary Psychiatric Diagnoses  Suicidal ideations 2.  Hallucinations   Assessment  Jeremiah Newman is a 33 y.o. male presenting to [facility] ED on 01/30/2024  4:33 AM for  hallucinations and suicidal ideations. Patient reports that hallucinations are intolerable, stating that he will act on auditory hallucinations that are telling him to kill himself. He appears to respond to internal stimuli. Patient noted pausing when asked a question, stating "it's these voices".  Patient has a psychiatric history of alcohol abuse, cocaine abuse, PTSD, schizoaffective d/o, bipolar type. He reports recently being released from solitary confinement in prison, stating he was in solitary confinement for an extended period of time. Patient has the desire to improve and states he is tired of his overwhelming symptoms. He is agreeable to inpatient psychiatry for symptom stabilization.   Collateral contact with patient's mother, Jeremiah Newman, completed. She reports that patient is currently prescribed Zyprexa 15 mg and hydroxyzine 50 mg.  He denies HI. Patient reports paranoia and delusional thoughts, stating he thinks people are outside of his home.   Diagnoses:  Active Hospital problems: Active Problems:   Schizoaffective disorder (HCC)    Plan   ## Psychiatric Medication Recommendations:  Continue home medications of Zyprexa 15 mg and Hydroxyzine 50 mg TID PRN for anxiety.  ## Medical Decision Making Capacity: Not specifically addressed in this encounter  ## Further Work-up:  -- Defer to EDP  -- most recent EKG on 12/19/23 had QtC of 429 -- Pertinent labwork reviewed earlier this admission includes: CBC, CMP, UDS neg, ethyl alcohol   ## Disposition:-- We  recommend inpatient psychiatric hospitalization when medically cleared. Patient is under voluntary admission status at this time; please IVC if attempts to leave hospital.  ## Behavioral / Environmental: - No specific recommendations at this time.     ## Safety and Observation Level:  - Based on my clinical evaluation, I estimate the patient to be at low risk of self harm in the current setting. - At this time, we recommend   routine. This decision is based on my review of the chart including patient's history and current presentation, interview of the patient, mental status examination, and consideration of suicide risk including evaluating suicidal ideation, plan, intent, suicidal or self-harm behaviors, risk factors, and protective factors. This judgment is based on our ability to directly address suicide risk, implement suicide prevention strategies, and develop a safety plan while the patient is in the clinical setting. Please contact our team if there is a concern that risk level has changed.  CSSR Risk Category:C-SSRS RISK CATEGORY: High Risk  Suicide Risk Assessment: Patient has following modifiable risk factors for suicide: active suicidal ideation, which we are addressing by inpatient psychiatry. Patient has following non-modifiable or demographic risk factors for suicide: male gender Patient has the following protective factors against suicide: Supportive family  Thank you for this consult request. Recommendations have been communicated to the primary team.  We will recommend inpatient psychiatry once medically cleared at this time.   Mcneil Sober, NP       History of Present Illness  Relevant Aspects of Hospital ED Course:  Admitted on 01/30/2024 for hallucinations and SI  Patient Report:  "Its the voices"  Psych ROS:  Depression: yes Anxiety:  yes Mania (lifetime and current): denies Psychosis: (lifetime and current): yes  Collateral information:  Ginnie Smart, patient's mother at  on 01/30/24  Review of Systems  Psychiatric/Behavioral:  Positive for depression, hallucinations and suicidal ideas. Negative for memory loss. The patient is nervous/anxious.   All other systems reviewed and are negative.    Psychiatric and Social History  Psychiatric History:  Information collected from patient, patient's mother Jeremiah Newman, ED treatment team  Prev Dx/Sx: Alcohol abuse, cocaine abuse, PTSD,  schizoaffective disorder, bipolar type Current Psych Provider: Yes Home Meds (current): Per his mother, Zyprexa 15 mg and hydroxyzine 50 mg Previous Med Trials: Per his record, lithium Therapy: Yes  Prior Psych Hospitalization: Yes Prior Self Harm: Yes Prior Violence: Denies  Family Psych History: unknown Family Hx suicide: sister inhospital suciicde attempt  Social History:  Developmental Hx: Patient unable to identify Educational Hx: Patient unable to identify Occupational Hx: Patient unable to identify Legal Hx: Patient unable to identify Living Situation: With mother and father Spiritual Hx: Patient unable to identify Access to weapons/lethal means: "There's guns in the house"  Substance History Alcohol: yes Type of alcohol liqour Last Drink yesterday Number of drinks per day 1 shot History of alcohol withdrawal seizures denies History of DT's denies Tobacco: yes. 1/2 pack per day Illicit drugs: denies. "In the past" Prescription drug abuse: denies Rehab hx: yes. "I dont' remember where"  Exam Findings  Physical Exam: I feel like I have to use the wall to walk" Vital Signs:  Temp:  [98.9 F (37.2 C)-99.3 F (37.4 C)] 98.9 F (37.2 C) (02/27 0835) Pulse Rate:  [79-86] 79 (02/27 0835) Resp:  [15-17] 17 (02/27 0835) BP: (132-137)/(69-86) 137/69 (02/27 0835) SpO2:  [98 %] 98 % (02/27 0835) Weight:  [84 kg] 84 kg (02/27 0438) Blood pressure 137/69, pulse 79, temperature  98.9 F (37.2 C), temperature source Oral, resp. rate 17, height 5\' 8"  (1.727 m), weight 84 kg, SpO2 98%. Body mass index is 28.16 kg/m.  Physical Exam Vitals and nursing note reviewed.  Constitutional:      Appearance: He is well-developed.  Neurological:     Mental Status: He is alert.     Mental Status Exam: General Appearance: Disheveled  Orientation: Other  Memory: Good  Concentration: Good  Recall: poor  Attention poor  Eye Contact:  Poor  Speech:  Clear and Coherent  Language:   Good  Volume:  Normal  Mood: Depressed  Affect: Congruent  Thought Process: Goal directed  Thought Content: Hallucinations  Suicidal Thoughts: Yes with intent  Homicidal Thoughts:  No  Judgement:  Poor  Insight:  Lacking  Psychomotor Activity: wnl  Akathisia:  No  Fund of Knowledge:  Good    Assets:  Desire for Improvement  Cognition:  WNL  ADL's:  Intact  AIMS (if indicated):        Other History   These have been pulled in through the EMR, reviewed, and updated if appropriate.  Family History:  The patient's family history is not on file.  Medical History: Past Medical History:  Diagnosis Date   Schizophrenia [facility])     Surgical History: No past surgical history on file.   Medications:  No current facility-administered medications for this encounter.  Current Outpatient Medications:    gabapentin (NEURONTIN) 300 MG capsule, Take 1 capsule (300 mg total) by mouth 3 (three) times daily., Disp: 90 capsule, Rfl: 0   hydrOXYzine (ATARAX) 50 MG tablet, Take 1 tablet (50 mg total) by mouth 3 (three) times daily as needed for anxiety., Disp: 30 tablet, Rfl: 0   lithium carbonate 300 MG capsule, Take 2 capsules (600 mg total) by mouth daily with supper AND 1 capsule (300 mg total) daily with breakfast., Disp: 90 capsule, Rfl: 0   nicotine (NICODERM CQ - DOSED IN MG/24 HOURS) 21 mg/24hr patch, Place 1 patch (21 mg total) onto the skin daily., Disp: 28 patch, Rfl: 0   nicotine polacrilex (NICORETTE) 2 MG gum, Take 1 each (2 mg total) by mouth as needed for smoking cessation., Disp: 100 tablet, Rfl: 0   OLANZapine (ZYPREXA) 15 MG tablet, Take 1 tablet (15 mg total) by mouth 2 (two) times daily., Disp: 60 tablet, Rfl: 0   pantoprazole (PROTONIX) 40 MG tablet, Take 1 tablet (40 mg total) by mouth daily., Disp: 30 tablet, Rfl: 0   melatonin 5 MG TABS, Take 1 tablet (5 mg total) by mouth at bedtime. (Patient not taking: Reported on 01/30/2024), Disp: 30 tablet, Rfl: 0  Allergies: No  Known Allergies  Mcneil Sober, NP

## 2024-01-30 NOTE — ED Notes (Addendum)
 Pt escorted to interview room for telepsych evaluation by ED tech. Gait steady and no distress noted at this time.

## 2024-01-30 NOTE — ED Provider Notes (Signed)
 [facility] Provider Note    Event Date/Time   First MD Initiated Contact with Patient 01/30/24 [phone]     (approximate)   History   Headache and Suicidal   HPI  Jeremiah Newman is a 33 y.o. male   Past medical history of schizophrenia PTSD and polysubstance use who presents to the emergency department with hearing voices and suicidal ideation.  This is a longstanding problem that worsened, having trouble sleeping, worsening his chronic headache.  No trauma noted.  No self-harm attempts.  He did take a shot of liquor but no other substance use reported by patient today. He reports no plan for his SI.  External Medical Documents Reviewed: Recent emergency department visit on 01/27/2024 for anxiety and suicidal ideation      Physical Exam   Triage Vital Signs: ED Triage Vitals  Encounter Vitals Group     BP      Systolic BP Percentile      Diastolic BP Percentile      Pulse      Resp      Temp      Temp src      SpO2      Weight      Height      Head Circumference      Peak Flow      Pain Score      Pain Loc      Pain Education      Exclude from Growth Chart     Most recent vital signs: Vitals:   01/30/24 0438  BP: 132/86  Pulse: 86  Resp: 15  Temp: 99.3 F (37.4 C)  SpO2: 98%    General: Awake, no distress.  CV:  Good peripheral perfusion.  Resp:  Normal effort.  Abd:  No distention.  Other:  No obvious signs of head trauma, neck supple full range of motion, awake alert oriented cooperative.  Lungs clear to auscultation bilaterally   ED Results / Procedures / Treatments   Labs (all labs ordered are listed, but only abnormal results are displayed) Labs Reviewed  COMPREHENSIVE METABOLIC PANEL - Abnormal; Notable for the following components:      Result Value   Sodium 133 (*)    CO2 19 (*)    Glucose, Bld 103 (*)    All other components within normal limits  CBC WITH DIFFERENTIAL/PLATELET - Abnormal; Notable for the  following components:   WBC 14.0 (*)    Neutro Abs 12.1 (*)    Abs Immature Granulocytes 0.13 (*)    All other components within normal limits  ACETAMINOPHEN LEVEL  ETHANOL  SALICYLATE LEVEL  URINE DRUG SCREEN, QUALITATIVE ([facility] ONLY)     I ordered and reviewed the above labs they are notable for electrolytes unremarkable.  PROCEDURES:  Critical Care performed: No  Procedures   MEDICATIONS ORDERED IN ED: Medications  acetaminophen (TYLENOL) tablet 1,000 mg (1,000 mg Oral Given 01/30/24 0447)  ketorolac (TORADOL) 30 MG/ML injection 30 mg (30 mg Intramuscular Given 01/30/24 0447)     IMPRESSION / MDM / ASSESSMENT AND PLAN / ED COURSE  I reviewed the triage vital signs and the nursing notes.                                Patient's presentation is most consistent with acute presentation with potential threat to life or bodily function.  Differential diagnosis includes, but  is not limited to, acute decompensated psychiatric illness, substance-induced mood disorder, headache disorder, considered but less likely meningitis, ICH   The patient is on the cardiac monitor to evaluate for evidence of arrhythmia and/or significant heart rate changes.  MDM:    This is a patient with psychiatric illness here with hallucinations poor sleep and suicidal ideation without a plan.  He is here voluntarily requesting psychiatric evaluation.  There is no obvious signs of head trauma and no reports of head trauma and and he reports no infectious symptoms, has no fever and no meningismus and so I doubt ICH or meningitis that account for his headache.  I will give him some medications to help with headache, get our standard laboratory analysis and toxicologic workup prior to medical clearance for psychiatric evaluation.Marland Kitchen       FINAL CLINICAL IMPRESSION(S) / ED DIAGNOSES   Final diagnoses:  Suicidal ideation     Rx / DC Orders   ED Discharge Orders     None        Note:  This  document was prepared using Dragon voice recognition software and may include unintentional dictation errors.    Pilar Jarvis, MD 01/30/24 [phone]

## 2024-01-30 NOTE — ED Notes (Signed)
 Pt resting quietly with blanket covering his face. No distress noted.

## 2024-01-31 DIAGNOSIS — F250 Schizoaffective disorder, bipolar type: Secondary | ICD-10-CM

## 2024-01-31 MED ORDER — MELATONIN 5 MG PO TABS
5.0000 mg | ORAL_TABLET | Freq: Every day | ORAL | Status: DC
  Administered 2024-02-01: 5 mg via ORAL
  Filled 2024-01-31: qty 1

## 2024-01-31 MED ORDER — PALIPERIDONE ER 3 MG PO TB24
6.0000 mg | ORAL_TABLET | Freq: Every day | ORAL | Status: DC
  Administered 2024-02-01 – 2024-02-04 (×4): 6 mg via ORAL
  Filled 2024-01-31 (×4): qty 2

## 2024-01-31 MED ORDER — TRAZODONE HCL 50 MG PO TABS
50.0000 mg | ORAL_TABLET | Freq: Every day | ORAL | Status: DC
  Administered 2024-01-31: 50 mg via ORAL
  Filled 2024-01-31: qty 1

## 2024-01-31 MED ORDER — NICOTINE POLACRILEX 2 MG MT GUM
2.0000 mg | CHEWING_GUM | OROMUCOSAL | Status: DC | PRN
Start: 2024-01-31 — End: 2024-02-02
  Administered 2024-01-31 – 2024-02-02 (×4): 2 mg via ORAL
  Filled 2024-01-31 (×4): qty 1

## 2024-01-31 MED ORDER — PALIPERIDONE ER 3 MG PO TB24
3.0000 mg | ORAL_TABLET | Freq: Once | ORAL | Status: AC
  Administered 2024-01-31: 3 mg via ORAL
  Filled 2024-01-31: qty 1

## 2024-01-31 NOTE — Group Note (Signed)
 Date:  01/31/2024 Time:  8:40 PM  Group Topic/Focus:  Spirituality:   The focus of this group is to discuss how one's spirituality can aide in recovery.    Participation Level:  Did Not Attend  Participation Quality:   NONE  Affect:   NONE  Cognitive:   NONE  Insight: None  Engagement in Group:   NONE  Modes of Intervention:   NONE  Additional Comments:  NONE    Jeremiah Newman 01/31/2024, 8:40 PM

## 2024-01-31 NOTE — Group Note (Signed)
 Recreation Therapy Group Note   Group Topic:Other  Group Date: 01/31/2024 Start Time: 1500 End Time: 1550 Facilitators: Rosina Lowenstein, LRT, CTRS Location: Courtyard  Group Description: [employer]. LRT and patients played games of basketball, drew with chalk, and played corn hole while outside in the courtyard while getting fresh air and sunlight. Music was being played in the background. LRT and peers conversed about different games they have played before, what they do in their free time and anything else that is on their minds. LRT encouraged pts to drink water after being outside, sweating and getting their heart rate up.  Goal Area(s) Addressed: Patient will build on frustration tolerance skills. Patients will partake in a competitive play game with peers. Patients will gain knowledge of new leisure interest/hobby.    Affect/Mood: Appropriate   Participation Level: Minimal    Clinical Observations/Individualized Feedback: Jeremiah Newman came late to group and left early. Pt minimally interacted with LRT and staff while present.   Plan: Continue to engage patient in RT group sessions 2-3x/week.   Rosina Lowenstein, LRT, CTRS 01/31/2024 4:46 PM

## 2024-01-31 NOTE — Progress Notes (Signed)
 Patient voluntarily admitted to [facility] for SAD.  Patient has had multiple admissions recently.  He states he is currently SI, HI, AVH, anxious and depressed, but contracts for safety. Patient is calm, cooperative, sleeping when I approached him.  Patient states his long term goal is to find a permanent in house psych admission.  Talked about alternatives like group home but unsure how he would pay for it.  Will continue to monitor.

## 2024-01-31 NOTE — Group Note (Signed)
 Date:  01/31/2024 Time:  5:47 PM  Group Topic/Focus:  Building Self Esteem:   The Focus of this group is helping patients become aware of the effects of self-esteem on their lives, the things they and others do that enhance or undermine their self-esteem, seeing the relationship between their level of self-esteem and the choices they make and learning ways to enhance self-esteem. Coping With Mental Health Crisis:   The purpose of this group is to help patients identify strategies for coping with mental health crisis.  Group discusses possible causes of crisis and ways to manage them effectively.     Participation Level:  Active  Participation Quality:  Appropriate  Affect:  Appropriate  Cognitive:  Appropriate  Insight: Appropriate  Engagement in Group:  Engaged  Modes of Intervention:  Activity and Socialization  Additional Comments:    Leondra Cullin L Booker Bhatnagar 01/31/2024, 5:47 PM

## 2024-01-31 NOTE — [facility] Suicide Risk Assessment (Signed)
 [facility] INPATIENT:  Family/Significant Other Suicide Prevention Education  Suicide Prevention Education:  Education Completed; Alison Breeding Sr., [phone], Father, has been identified by the patient as the family member/significant other with whom the patient will be residing, and identified as the person(s) who will aid the patient in the event of a mental health crisis (suicidal ideations/suicide attempt).  With written consent from the patient, the family member/significant other has been provided the following suicide prevention education, prior to the and/or following the discharge of the patient.  The suicide prevention education provided includes the following: Suicide risk factors Suicide prevention and interventions National Suicide Hotline telephone number [facility] assessment telephone number [facility] Emergency Assistance 911 [facility] and/or Residential Mobile Crisis Unit telephone number  Request made of family/significant other to: Remove weapons (e.g., guns, rifles, knives), all items previously/currently identified as safety concern.   Remove drugs/medications (over-the-counter, prescriptions, illicit drugs), all items previously/currently identified as a safety concern.  The family member/significant other verbalizes understanding of the suicide prevention education information provided.  The family member/significant other agrees to remove the items of safety concern listed above.  Lowry Ram 01/31/2024, 3:47 PM

## 2024-01-31 NOTE — Group Note (Signed)
 Recreation Therapy Group Note   Group Topic:Leisure Education  Group Date: 01/31/2024 Start Time: 1000 End Time: 1100 Facilitators: Rosina Lowenstein, LRT, CTRS Location:  Craft Room  Group Description: Leisure. Patients were given the option to choose from singing karaoke, coloring mandalas, using oil pastels, journaling, or playing with play-doh. LRT and pts discussed the meaning of leisure, the importance of participating in leisure during their free time/when they're outside of the hospital, as well as how our leisure interests can also serve as coping skills.   Goal Area(s) Addressed:  Patient will identify a current leisure interest.  Patient will learn the definition of "leisure". Patient will practice making a positive decision. Patient will have the opportunity to try a new leisure activity. Patient will communicate with peers and LRT.    Affect/Mood: N/A   Participation Level: Did not attend    Clinical Observations/Individualized Feedback: Patient did not attend group.   Plan: Continue to engage patient in RT group sessions 2-3x/week.   Rosina Lowenstein, LRT, CTRS 01/31/2024 12:18 PM
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
## 2024-01-31 NOTE — H&P (Signed)
 Psychiatric Admission Assessment Adult  Patient Identification: Jeremiah Newman MRN:  [id number] Date of Evaluation:  01/31/2024 Chief Complaint:  Schizoaffective disorder, bipolar type (HCC) [F25.0] Principal Diagnosis: Schizoaffective disorder, bipolar type (HCC) Diagnosis:  Principal Problem:   Schizoaffective disorder, bipolar type (HCC)  History of Present Illness:  33 year old Caucasian male with reported history of schizoaffective disorder, PTSD, polysubstance abuse presented to the emergency department yesterday with chief complaints of auditory hallucinations and suicidal ideations with plan to overdose on medication, either Seroquel or Remeron.  Patient reported to ED provider that he has been having difficulty sleeping, chronic headaches.  While in ED reported that he had a shot of liquor yesterday, per [facility] patient reported that he last used methamphetamine and fentanyl about a week ago.  Patient was admitted voluntarily.  Patient seen for evaluation, reports that this is his 3rd time coming here to [facility] over the last 3 months. That he has been hearing voices and "seeing things, I've been having panic attacks and I'm not sleeping... I can't take it anymore, I'm tired of the stress. I might kill myself".  Reports that after getting discharged from this inpatient behavioral health unit back in January, he was transported to [facility] for inpatient substance use treatment, that he completed the program and was sent to another facility because he was having issues there.  He declines to elaborate on these issues.  States that earlier this week on Monday he went to this other facility, [facility] for about 3 hours and he did not like it there so he contacted the cops and told them that he wanted to kill himself.  States that he was transported to [facility] in [place] and his father picked him up. Could not explain why he was discharged the same day after reporting telling staff there that he was  having hallucinations and suicidal ideations. Reports that he had thoughts of possibly overdosing on Seroquel, cannot return home because "I can't keep doing this to my parents.. I keep waking them up in the night because I think there are people outside, and I keep telling them to get the guns".  Reports that he has been having suicidal thoughts this time around 4 months now, denies any resolution of suicidal thoughts even with recent admission here on the unit.  Initially states that he has been compliant with his medications, later during evaluation he does divulges that he ran out of his medications and has not taken them all week.  Reports that the voices are laughing at him, telling him that they are going to get him, " we're going to kill him".  Reports that there are " a bunch" of voices, cannot tell what gender they are, describes them as " espousing whispers", reports that he last had these auditory hallucinations " 30 seconds ago".  Has not been observed to be internally preoccupied, or Tis, he is able to have a linear conversation, does not appear to be distracted.  States that he sees shadows with faces, last was last night.  Continues to endorse suicidal thoughts with a plan to " starve myself", observed sipping juice from his cup, making this statement. Contrary to chart review, states that he has not used any alcohol or drugs in the last 6 to 7 years.  Reports that he has a history of cocaine, marijuana and heroin use.  Does report that he smokes a pack of cigarettes per day. Denies HI and VH currently.     Associated Signs/Symptoms:  Depression Symptoms:  depressed mood, hopelessness, suicidal thoughts with specific plan, disturbed sleep, (Hypo) Manic Symptoms:  Hallucinations, Anxiety Symptoms:   appears anxious Psychotic Symptoms:  Hallucinations: Auditory Visual Paranoia, PTSD Symptoms: NA Total Time spent with patient: 1.5 hours  Past Psychiatric History:  Alcohol abuse, cocaine  abuse, PTSD, schizoaffective disorder, bipolar type.  Multiple previous inpatient hospitalizations, most recent [facility] January 2025. Not established with outpatient.  Multiple previous suicide attempts, 1 year ago via hanging while incarcerated, most recently reports that he was attempting to "drink myself to death 3 months ago".  Past trials of Remeron, Lexapro, Zyprexa, Lithium, hydroxyzine, Paxil, Prozac, Lamictal, Seroquel (reports that there have been no meds that he has stabilized on)   Is the patient at risk to self? No.  Has the patient been a risk to self in the past 6 months? Yes.    Has the patient been a risk to self within the distant past? Yes.    Is the patient a risk to others? Yes.    Has the patient been a risk to others in the past 6 months? No.  Has the patient been a risk to others within the distant past? No.   [place] Scale:  Flowsheet Row Admission (Current) from 01/30/2024 in [facility] INPATIENT BEHAVIORAL MEDICINE Most recent reading at 01/30/2024  9:17 PM ED from 01/30/2024 in [facility] at [facility] Most recent reading at 01/30/2024  1:28 PM Admission (Discharged) from 12/18/2023 in [facility] INPATIENT BEHAVIORAL MEDICINE Most recent reading at 12/18/2023  4:32 PM  C-SSRS RISK CATEGORY Error: Q7 should not be populated when Q6 is No High Risk High Risk        Prior Inpatient Therapy: multiple, was last here at [facility] January 2025, was [facility] to [facility] Prior Outpatient Therapy: pt denies   Alcohol Screening:   Substance Abuse History in the last 12 months:  Yes.   Consequences of Substance Abuse: Legal Consequences:  IVC, incarceration  Previous Psychotropic Medications: Yes  Psychological Evaluations: No  Past Medical History:  Past Medical History:  Diagnosis Date   Schizophrenia (HCC)    History reviewed. No pertinent surgical history. Family History: History reviewed. No pertinent family history. Family Psychiatric  History: sister -  suicide per chart review Tobacco Screening:  Social History   Tobacco Use  Smoking Status Some Days   Current packs/day: 1.00   Average packs/day: 1 pack/day for 9.2 years (9.2 ttl pk-yrs)   Types: Cigarettes   Start date: 2016  Smokeless Tobacco Never    BH Tobacco Counseling     Are you interested in Tobacco Cessation Medications?  No, patient refused Counseled patient on smoking cessation:  N/A, patient does not use tobacco products Reason Tobacco Screening Not Completed: No value filed.       Social History:  Social History   Substance and Sexual Activity  Alcohol Use Yes     Social History   Substance and Sexual Activity  Drug Use No    Additional Social History: Marital status: Single Are you sexually active?: No What is your sexual orientation?: Heterosexual Has your sexual activity been affected by drugs, alcohol, medication, or emotional stress?: Patient denies. Does patient have children?: Yes How many children?: 1 How is patient's relationship with their children?: "Haven't seen him in a long time." Pt shared that child's mother is dead and he does not know where him and the guardians are.  Allergies:  No Known Allergies Lab Results:  Results for orders placed or performed during the hospital encounter of 01/30/24 (from the past 48 hours)  Urine Drug Screen, Qualitative     Status: None   Collection Time: 01/30/24  4:40 AM  Result Value Ref Range   Tricyclic, Ur Screen NONE DETECTED NONE DETECTED   Amphetamines, Ur Screen NONE DETECTED NONE DETECTED   MDMA (Ecstasy)Ur Screen NONE DETECTED NONE DETECTED   Cocaine Metabolite,Ur [place] NONE DETECTED NONE DETECTED   Opiate, Ur Screen NONE DETECTED NONE DETECTED   Phencyclidine (PCP) Ur S NONE DETECTED NONE DETECTED   Cannabinoid 50 Ng, Ur  NONE DETECTED NONE DETECTED   Barbiturates, Ur Screen NONE DETECTED NONE DETECTED   Benzodiazepine, Ur Scrn NONE DETECTED NONE DETECTED    Methadone Scn, Ur NONE DETECTED NONE DETECTED    Comment: (NOTE) Tricyclics + metabolites, urine    Cutoff 1000 ng/mL Amphetamines + metabolites, urine  Cutoff 1000 ng/mL MDMA (Ecstasy), urine              Cutoff 500 ng/mL Cocaine Metabolite, urine          Cutoff 300 ng/mL Opiate + metabolites, urine        Cutoff 300 ng/mL Phencyclidine (PCP), urine         Cutoff 25 ng/mL Cannabinoid, urine                 Cutoff 50 ng/mL Barbiturates + metabolites, urine  Cutoff 200 ng/mL Benzodiazepine, urine              Cutoff 200 ng/mL Methadone, urine                   Cutoff 300 ng/mL  The urine drug screen provides only a preliminary, unconfirmed analytical test result and should not be used for non-medical purposes. Clinical consideration and professional judgment should be applied to any positive drug screen result due to possible interfering substances. A more specific alternate chemical method must be used in order to obtain a confirmed analytical result. Gas chromatography / mass spectrometry (GC/MS) is the preferred confirm atory method. Performed at [facility], [address] [place], [place] [address]   Acetaminophen level     Status: Abnormal   Collection Time: 01/30/24  4:41 AM  Result Value Ref Range   Acetaminophen (Tylenol), Serum <10 (L) 10 - 30 ug/mL    Comment: (NOTE) Therapeutic concentrations vary significantly. A range of 10-30 ug/mL  may be an effective concentration for many patients. However, some  are best treated at concentrations outside of this range. Acetaminophen concentrations >150 ug/mL at 4 hours after ingestion  and >50 ug/mL at 12 hours after ingestion are often associated with  toxic reactions.  Performed at [facility] [address] [place], [place] [address]   Comprehensive metabolic panel     Status: Abnormal   Collection Time: 01/30/24  4:41 AM  Result Value Ref Range   Sodium 133 (L) 135 - 145 mmol/L   Potassium  3.6 3.5 - 5.1 mmol/L   Chloride 103 98 - 111 mmol/L   CO2 19 (L) 22 - 32 mmol/L   Glucose, Bld 103 (H) 70 - 99 mg/dL    Comment: Glucose reference range applies only to samples taken after fasting for at least 8 hours.   BUN 16 6 - 20 mg/dL   Creatinine, Ser 0.98 0.61 - 1.24 mg/dL   Calcium 9.1 8.9 - 11.9 mg/dL  Total Protein 7.5 6.5 - 8.1 g/dL   Albumin 4.4 3.5 - 5.0 g/dL   AST 23 15 - 41 U/L   ALT 22 0 - 44 U/L   Alkaline Phosphatase 69 38 - 126 U/L   Total Bilirubin 0.3 0.0 - 1.2 mg/dL   GFR, Estimated >95 >28 mL/min    Comment: (NOTE) Calculated using the CKD-EPI Creatinine Equation (2021)    Anion gap 11 5 - 15    Comment: Performed at [facility], [address] [place], [place] [address]  Ethanol     Status: None   Collection Time: 01/30/24  4:41 AM  Result Value Ref Range   Alcohol, Ethyl (B) <10 <10 mg/dL    Comment: (NOTE) Lowest detectable limit for serum alcohol is 10 mg/dL.  For medical purposes only. Performed at [facility], [address] [place], [place] [address]   Salicylate level     Status: Abnormal   Collection Time: 01/30/24  4:41 AM  Result Value Ref Range   Salicylate Lvl <7.0 (L) 7.0 - 30.0 mg/dL    Comment: Performed at [facility], [address] [place], [place] [address]  CBC with Differential     Status: Abnormal   Collection Time: 01/30/24  4:41 AM  Result Value Ref Range   WBC 14.0 (H) 4.0 - 10.5 K/uL   RBC 4.82 4.22 - 5.81 MIL/uL   Hemoglobin 14.2 13.0 - 17.0 g/dL   HCT 64.4 03.4 - 74.2 %   MCV 83.2 80.0 - 100.0 fL   MCH 29.5 26.0 - 34.0 pg   MCHC 35.4 30.0 - 36.0 g/dL   RDW 59.5 63.8 - 75.6 %   Platelets 292 150 - 400 K/uL   nRBC 0.0 0.0 - 0.2 %   Neutrophils Relative % 87 %   Neutro Abs 12.1 (H) 1.7 - 7.7 K/uL   Lymphocytes Relative 6 %   Lymphs Abs 0.9 0.7 - 4.0 K/uL   Monocytes Relative 6 %   Monocytes Absolute 0.9 0.1 - 1.0 K/uL   Eosinophils Relative 0 %   Eosinophils Absolute 0.0 0.0  - 0.5 K/uL   Basophils Relative 0 %   Basophils Absolute 0.0 0.0 - 0.1 K/uL   Immature Granulocytes 1 %   Abs Immature Granulocytes 0.13 (H) 0.00 - 0.07 K/uL    Comment: Performed at [facility], [address] [place], [place] [address]    Blood Alcohol level:  Lab Results  Component Value Date   [facility] <10 01/30/2024   ETH <10 12/18/2023    Metabolic Disorder Labs:  Lab Results  Component Value Date   HGBA1C 4.7 (L) 12/23/2023   MPG 88.19 12/23/2023   MPG 93.93 10/10/2023   No results found for: "PROLACTIN" Lab Results  Component Value Date   CHOL 215 (H) 12/23/2023   TRIG 297 (H) 12/23/2023   HDL 40 (L) 12/23/2023   CHOLHDL 5.4 12/23/2023   VLDL 59 (H) 12/23/2023   LDLCALC 116 (H) 12/23/2023   LDLCALC 146 (H) 10/10/2023    Current Medications: Current Facility-Administered Medications  Medication Dose Route Frequency Provider Last Rate Last Admin   acetaminophen (TYLENOL) tablet 650 mg  650 mg Oral Q6H PRN Adellyn Capek, PA-C       alum & mag hydroxide-simeth (MAALOX/MYLANTA) 200-200-20 MG/5ML suspension 30 mL  30 mL Oral Q4H PRN Luian Schumpert, PA-C       haloperidol (HALDOL) tablet 5 mg  5 mg Oral TID PRN Skeeter Sheard,  Judeth Cornfield, PA-C       And   diphenhydrAMINE (BENADRYL) capsule 50 mg  50 mg Oral TID PRN Eulalia Ellerman, PA-C       haloperidol lactate (HALDOL) injection 5 mg  5 mg Intramuscular TID PRN Tamari Redwine, PA-C       And   diphenhydrAMINE (BENADRYL) injection 50 mg  50 mg Intramuscular TID PRN Trexton Escamilla, PA-C       And   LORazepam (ATIVAN) injection 2 mg  2 mg Intramuscular TID PRN Korby Ratay, PA-C       haloperidol lactate (HALDOL) injection 10 mg  10 mg Intramuscular TID PRN Favian Kittleson, PA-C       And   diphenhydrAMINE (BENADRYL) injection 50 mg  50 mg Intramuscular TID PRN Daija Routson, PA-C       And   LORazepam (ATIVAN) injection 2 mg  2 mg Intramuscular TID PRN Dilynn Munroe,  PA-C       hydrOXYzine (ATARAX) tablet 50 mg  50 mg Oral TID PRN Abdulraheem Pineo, PA-C       magnesium hydroxide (MILK OF MAGNESIA) suspension 30 mL  30 mL Oral Daily PRN Anmol Fleck, PA-C       melatonin tablet 4 mg  4 mg Oral QHS Lauralye Kinn, PA-C       nicotine polacrilex (NICORETTE) gum 2 mg  2 mg Oral PRN Reda Gettis, PA-C   2 mg at 01/31/24 1617   [START ON 02/01/2024] paliperidone (INVEGA) 24 hr tablet 6 mg  6 mg Oral Daily Dondre Catalfamo, PA-C       traZODone (DESYREL) tablet 50 mg  50 mg Oral QHS Laverna Dossett, PA-C       PTA Medications: Medications Prior to Admission  Medication Sig Dispense Refill Last Dose/Taking   gabapentin (NEURONTIN) 300 MG capsule Take 1 capsule (300 mg total) by mouth 3 (three) times daily. 90 capsule 0    hydrOXYzine (ATARAX) 50 MG tablet Take 1 tablet (50 mg total) by mouth 3 (three) times daily as needed for anxiety. 30 tablet 0    lithium carbonate 300 MG capsule Take 2 capsules (600 mg total) by mouth daily with supper AND 1 capsule (300 mg total) daily with breakfast. 90 capsule 0    melatonin 5 MG TABS Take 1 tablet (5 mg total) by mouth at bedtime. (Patient not taking: Reported on 01/30/2024) 30 tablet 0    nicotine (NICODERM CQ - DOSED IN MG/24 HOURS) 21 mg/24hr patch Place 1 patch (21 mg total) onto the skin daily. 28 patch 0    nicotine polacrilex (NICORETTE) 2 MG gum Take 1 each (2 mg total) by mouth as needed for smoking cessation. 100 tablet 0    OLANZapine (ZYPREXA) 15 MG tablet Take 1 tablet (15 mg total) by mouth 2 (two) times daily. 60 tablet 0    pantoprazole (PROTONIX) 40 MG tablet Take 1 tablet (40 mg total) by mouth daily. 30 tablet 0     Musculoskeletal: Strength & Muscle Tone: within normal limits Gait & Station: normal Patient leans: N/A            Psychiatric Specialty Exam:  Presentation  General Appearance:  Disheveled  Eye Contact: Fleeting  Speech: Normal Rate  Speech  Volume: Normal  Handedness: Right   Mood and Affect  Mood: Depressed; Hopeless  Affect: Other (comment) (anxious)   Thought Process  Thought Processes: Linear; Goal Directed  Duration of Psychotic Symptoms: "for years", reports that he was never not  suicidal even after being dc'ed from [facility] last month  Past Diagnosis of Schizophrenia or Psychoactive disorder: Yes  Descriptions of Associations:Intact  Orientation:Full (Time, Place and Person)  Thought Content:Logical  Hallucinations:Hallucinations: Auditory; Visual Description of Auditory Hallucinations: Reports that there are a lot of voices, " as housing whispers", present all day every day Description of Visual Hallucinations: Shadows with faces  Ideas of Reference:Paranoia  Suicidal Thoughts:Suicidal Thoughts: Yes, Active SI Active Intent and/or Plan: With Plan (Is to starve himself, however he has been eating his meals)  Homicidal Thoughts:Homicidal Thoughts: No   Sensorium  Memory: Immediate Good  Judgment: Fair  Insight: Fair   Executive Functions  Concentration: Good  Attention Span: Good  Recall: Good  Fund of Knowledge: Fair  Language: Fair   Psychomotor Activity  Psychomotor Activity: Psychomotor Activity: Normal   Assets  Assets: Communication Skills; Social Support   Sleep  Sleep: Sleep: Fair    Physical Exam: Physical Exam Vitals and nursing note reviewed.  HENT:     Head: Normocephalic and atraumatic.     Nose: Nose normal.     Mouth/Throat:     Mouth: Mucous membranes are moist.  Eyes:     Extraocular Movements: Extraocular movements intact.  Pulmonary:     Effort: Pulmonary effort is normal.  Musculoskeletal:        General: Normal range of motion.     Cervical back: Normal range of motion.  Skin:    General: Skin is warm and dry.  Neurological:     General: No focal deficit present.     Mental Status: He is alert and oriented to person, place, and  time.  Psychiatric:        Attention and Perception: Attention normal. He perceives auditory and visual hallucinations.        Behavior: Behavior normal.        Cognition and Memory: Cognition and memory normal.     Comments: Patient is reporting auditory hallucinations, however her concentration and attention are good, no RTIS, does not appear to be internally preoccupied, conversation is linear    Review of Systems  Psychiatric/Behavioral:  Positive for depression, hallucinations and suicidal ideas. The patient has insomnia.   All other systems reviewed and are negative.  Blood pressure 117/70, pulse 69, temperature (!) 97 F (36.1 C), resp. rate 20, height 5\' 7"  (1.702 m), weight 98.4 kg, SpO2 99%. Body mass index is 33.99 kg/m.  Treatment Plan Summary: Daily contact with patient to assess and evaluate symptoms and progress in treatment and Medication management  Observation Level/Precautions:  15 minute checks  Laboratory:  HbAIC EKG  Psychotherapy:    Medications:  Invega  Consultations:    Discharge Concerns:  substance abuse   Estimated LOS:  Other:     Physician Treatment Plan for Primary Diagnosis: Schizoaffective disorder, bipolar type (HCC)  1.    Safety and Monitoring:   --  Voluntary admission to inpatient psychiatric unit for safety, stabilization and treatment -- Daily contact with patient to assess and evaluate symptoms and progress in treatment -- Patient's case to be discussed in multi-disciplinary team meeting -- Observation Level : q15 minute checks -- Vital signs:  q12 hours -- Precautions: suicide   2. Psychiatric Diagnoses and Treatment:    -- Start Invega 3 mg today, Increase to 6 mg tomorrow, 9 mg the following day for schizoaffective disorder. Will plan for LAI -- Hydroxyzine 50 mg TID PRN for anxiety -- Trazodone 50 mg HS for sleep  disturbances/depression   --  The risks/benefits/side-effects/alternatives to this medication were discussed in  detail with the patient and time was given for questions. The patient consents to medication trial.  -- Metabolic profile and EKG monitoring obtained while on an atypical antipsychotic  (BMI: 33.99;  HbgA1c: pending; QTc: 408)  -- Encouraged patient to participate in unit milieu and in scheduled group therapies  -- Short Term Goals: Ability to identify changes in lifestyle to reduce recurrence of condition will improve, Ability to verbalize feelings will improve, Ability to disclose and discuss suicidal ideas, Ability to demonstrate self-control will improve, Ability to identify and develop effective coping behaviors will improve, Ability to maintain clinical measurements within normal limits will improve, Compliance with prescribed medications will improve, and Ability to identify triggers associated with substance abuse/mental health issues will improve -- Long Term Goals: Improvement in symptoms so as ready for discharge        3. Medical Issues Being Addressed:               Tobacco Use Disorder             -- Nicotine gum 2mg  PRN             -- Smoking cessation encouraged   4. Discharge Planning:   -- Social work and case management to assist with discharge planning and identification of hospital follow-up needs prior to discharge -- Estimated LOS: 5-7 days -- Discharge Concerns: Need to establish a safety plan; Medication compliance and effectiveness -- Discharge Goals: Return home with outpatient referrals for mental health follow-up including medication management/psychotherapy   I certify that inpatient services furnished can reasonably be expected to improve the patient's condition.    Paulene Floor, PA-C 2/28/20258:16 PM

## 2024-01-31 NOTE — [facility] Counselor (Addendum)
 CSW touched base with [facility] in [place] on patient's behalf. This is acute care 5-7 days.   CSW touched base with friend's of Bill at [phone] and dicussed the $470 move in fee with the patient's father and te $150 monthly fee.   Patient's father is also interested in [facility] in [place], [place] at [phone]. CSW touched base with [facility] who provided CSW with PDF application to be completed by patient. Application provided to patient to complete.    CSW team to continue to assess.    Jeremiah Newman, MSW, LCSWA 01/31/2024 3:46 PM

## 2024-01-31 NOTE — BH IP Treatment Plan (Signed)
 Interdisciplinary Treatment and Diagnostic Plan Update  01/31/2024 Time of Session: 1:55 PM NIAM NEPOMUCENO MRN: [id number]  Principal Diagnosis: Schizoaffective disorder, bipolar type [facility])  Secondary Diagnoses: Principal Problem:   Schizoaffective disorder, bipolar type (HCC)   Current Medications:  Current Facility-Administered Medications  Medication Dose Route Frequency Provider Last Rate Last Admin   acetaminophen (TYLENOL) tablet 650 mg  650 mg Oral Q6H PRN Tingling, Stephanie, PA-C       alum & mag hydroxide-simeth (MAALOX/MYLANTA) 200-200-20 MG/5ML suspension 30 mL  30 mL Oral Q4H PRN Tingling, Stephanie, PA-C       haloperidol (HALDOL) tablet 5 mg  5 mg Oral TID PRN Tingling, Stephanie, PA-C       And   diphenhydrAMINE (BENADRYL) capsule 50 mg  50 mg Oral TID PRN Tingling, Stephanie, PA-C       haloperidol lactate (HALDOL) injection 5 mg  5 mg Intramuscular TID PRN Tingling, Stephanie, PA-C       And   diphenhydrAMINE (BENADRYL) injection 50 mg  50 mg Intramuscular TID PRN Tingling, Stephanie, PA-C       And   LORazepam (ATIVAN) injection 2 mg  2 mg Intramuscular TID PRN Tingling, Stephanie, PA-C       haloperidol lactate (HALDOL) injection 10 mg  10 mg Intramuscular TID PRN Tingling, Stephanie, PA-C       And   diphenhydrAMINE (BENADRYL) injection 50 mg  50 mg Intramuscular TID PRN Tingling, Stephanie, PA-C       And   LORazepam (ATIVAN) injection 2 mg  2 mg Intramuscular TID PRN Tingling, Stephanie, PA-C       hydrOXYzine (ATARAX) tablet 50 mg  50 mg Oral TID PRN Tingling, Stephanie, PA-C       magnesium hydroxide (MILK OF MAGNESIA) suspension 30 mL  30 mL Oral Daily PRN Tingling, Stephanie, PA-C       [START ON 02/01/2024] paliperidone (INVEGA) 24 hr tablet 6 mg  6 mg Oral Daily Tingling, Stephanie, PA-C       PTA Medications: Medications Prior to Admission  Medication Sig Dispense Refill Last Dose/Taking   gabapentin (NEURONTIN) 300 MG capsule Take 1 capsule (300 mg  total) by mouth 3 (three) times daily. 90 capsule 0    hydrOXYzine (ATARAX) 50 MG tablet Take 1 tablet (50 mg total) by mouth 3 (three) times daily as needed for anxiety. 30 tablet 0    lithium carbonate 300 MG capsule Take 2 capsules (600 mg total) by mouth daily with supper AND 1 capsule (300 mg total) daily with breakfast. 90 capsule 0    melatonin 5 MG TABS Take 1 tablet (5 mg total) by mouth at bedtime. (Patient not taking: Reported on 01/30/2024) 30 tablet 0    nicotine (NICODERM CQ - DOSED IN MG/24 HOURS) 21 mg/24hr patch Place 1 patch (21 mg total) onto the skin daily. 28 patch 0    nicotine polacrilex (NICORETTE) 2 MG gum Take 1 each (2 mg total) by mouth as needed for smoking cessation. 100 tablet 0    OLANZapine (ZYPREXA) 15 MG tablet Take 1 tablet (15 mg total) by mouth 2 (two) times daily. 60 tablet 0    pantoprazole (PROTONIX) 40 MG tablet Take 1 tablet (40 mg total) by mouth daily. 30 tablet 0     Patient Stressors:    Patient Strengths:    Treatment Modalities: Medication Management, Group therapy, Case management,  1 to 1 session with clinician, Psychoeducation, Recreational therapy.   Physician Treatment Plan for  Primary Diagnosis: Schizoaffective disorder, bipolar type (HCC) Long Term Goal(s):     Short Term Goals:    Medication Management: Evaluate patient's response, side effects, and tolerance of medication regimen.  Therapeutic Interventions: 1 to 1 sessions, Unit Group sessions and Medication administration.  Evaluation of Outcomes: Not Met  Physician Treatment Plan for Secondary Diagnosis: Principal Problem:   Schizoaffective disorder, bipolar type (HCC)  Long Term Goal(s):     Short Term Goals:       Medication Management: Evaluate patient's response, side effects, and tolerance of medication regimen.  Therapeutic Interventions: 1 to 1 sessions, Unit Group sessions and Medication administration.  Evaluation of Outcomes: Not Met   RN Treatment Plan  for Primary Diagnosis: Schizoaffective disorder, bipolar type (HCC) Long Term Goal(s): Knowledge of disease and therapeutic regimen to maintain health will improve  Short Term Goals: Ability to verbalize frustration and anger appropriately will improve, Ability to demonstrate self-control, Ability to participate in decision making will improve, Ability to verbalize feelings will improve, Ability to disclose and discuss suicidal ideas, and Ability to identify and develop effective coping behaviors will improve  Medication Management: RN will administer medications as ordered by provider, will assess and evaluate patient's response and provide education to patient for prescribed medication. RN will report any adverse and/or side effects to prescribing provider.  Therapeutic Interventions: 1 on 1 counseling sessions, Psychoeducation, Medication administration, Evaluate responses to treatment, Monitor vital signs and CBGs as ordered, Perform/monitor CIWA, COWS, AIMS and Fall Risk screenings as ordered, Perform wound care treatments as ordered.  Evaluation of Outcomes: Not Met   LCSW Treatment Plan for Primary Diagnosis: Schizoaffective disorder, bipolar type (HCC) Long Term Goal(s): Safe transition to appropriate next level of care at discharge, Engage patient in therapeutic group addressing interpersonal concerns.  Short Term Goals: Engage patient in aftercare planning with referrals and resources, Increase social support, Increase ability to appropriately verbalize feelings, Increase emotional regulation, Facilitate acceptance of mental health diagnosis and concerns, Facilitate patient progression through stages of change regarding substance use diagnoses and concerns, Identify triggers associated with mental health/substance abuse issues, and Increase skills for wellness and recovery  Therapeutic Interventions: Assess for all discharge needs, 1 to 1 time with Social worker, Explore available  resources and support systems, Assess for adequacy in community support network, Educate family and significant other(s) on suicide prevention, Complete Psychosocial Assessment, Interpersonal group therapy.  Evaluation of Outcomes: Not Met   Progress in Treatment: Attending groups: No. Participating in groups: No. Taking medication as prescribed: Yes. Toleration medication: Yes. Family/Significant other contact made: No, will contact:  CSW to contact once permission is granted.  Patient understands diagnosis: Yes. Discussing patient identified problems/goals with staff: Yes. Medical problems stabilized or resolved: Yes. Denies suicidal/homicidal ideation: Yes. Issues/concerns per patient self-inventory: No. Other: None  New problem(s) identified: No, Describe:  None   New Short Term/Long Term Goal(s):detox, elimination of symptoms of psychosis, medication management for mood stabilization; elimination of SI thoughts; development of comprehensive mental wellness/sobriety plan.    Patient Goals:  "Long term treatment."  Discharge Plan or Barriers: CSW to assist in the development of appropriate discharge plan.   Reason for Continuation of Hospitalization: Anxiety Depression Hallucinations Medication stabilization Suicidal ideation Withdrawal symptoms  Estimated Length of Stay: 1-7 days.  Last 3 [place] Suicide Severity Risk Score: Flowsheet Row Admission (Current) from 01/30/2024 in [facility] INPATIENT BEHAVIORAL MEDICINE Most recent reading at 01/30/2024  9:17 PM ED from 01/30/2024 in [facility] Emergency Department at [facility]  Most recent reading at 01/30/2024  1:28 PM Admission (Discharged) from 12/18/2023 in [facility] INPATIENT BEHAVIORAL MEDICINE Most recent reading at 12/18/2023  4:32 PM  C-SSRS RISK CATEGORY Error: Q7 should not be populated when Q6 is No High Risk High Risk       Last PHQ 2/9 Scores:     No data to display          Scribe for Treatment  Team: Alveda Reasons 01/31/2024 2:38 PM

## 2024-02-01 DIAGNOSIS — F250 Schizoaffective disorder, bipolar type: Secondary | ICD-10-CM | POA: Diagnosis not present

## 2024-02-01 MED ORDER — TRAZODONE HCL 100 MG PO TABS
100.0000 mg | ORAL_TABLET | Freq: Every day | ORAL | Status: DC
  Administered 2024-02-01: 100 mg via ORAL
  Filled 2024-02-01: qty 1

## 2024-02-01 MED ORDER — LITHIUM CARBONATE ER 300 MG PO TBCR
300.0000 mg | EXTENDED_RELEASE_TABLET | Freq: Two times a day (BID) | ORAL | Status: DC
  Administered 2024-02-01 – 2024-02-07 (×13): 300 mg via ORAL
  Filled 2024-02-01 (×13): qty 1

## 2024-02-01 NOTE — Plan of Care (Signed)
   Problem: Education: Goal: Emotional status will improve Outcome: Progressing   Problem: Education: Goal: Mental status will improve Outcome: Progressing

## 2024-02-01 NOTE — Group Note (Signed)
 Date:  02/01/2024 Time:  10:46 AM  Group Topic/Focus:  Music Therapy: The purpose of this group is to allow patients to enjoy soothing music of their choice.    Participation Level:  Did Not Attend  Participation Quality:    Affect:    Cognitive:    Insight:   Engagement in Group:    Modes of Intervention:    Additional Comments:  Did not attend   Deveney Bayon T Jancarlo Biermann 02/01/2024, 10:46 AM

## 2024-02-01 NOTE — Group Note (Signed)
 Date:  02/01/2024 Time:  5:19 PM  Group Topic/Focus:  Activity Group: The focus of the group is to promote activity for the patients and encourage them to go outside to the courtyard to receive some exercise and some fresh air.    Participation Level:  Active  Participation Quality:  Appropriate  Affect:  Appropriate  Cognitive:  Appropriate  Insight: Appropriate  Engagement in Group:  Engaged  Modes of Intervention:  Activity  Additional Comments:    Mary Sella Aser Nylund 02/01/2024, 5:19 PM

## 2024-02-01 NOTE — Progress Notes (Signed)
 Patient is a voluntary admission to BMU for SAD/ Bipolar expressing SI, and depression today. Denies HI, AVH and anxiety.  PRN nicorette given 2x at patient's request.  Patient calm, cooperative, friendly with staff and peers.  Participated in group activities.  Will continue to monitor.

## 2024-02-01 NOTE — Group Note (Signed)
 Date:  02/01/2024 Time:  10:33 PM  Group Topic/Focus:  Self Care:   The focus of this group is to help patients understand the importance of self-care in order to improve or restore emotional, physical, spiritual, interpersonal, and financial health.    Participation Level:  Did Not Attend  Participation Quality:    Affect:    Cognitive:    Insight:   Engagement in Group:    Modes of Intervention:    Additional Comments:    Jeremiah Newman 02/01/2024, 10:33 PM

## 2024-02-01 NOTE — Progress Notes (Signed)
 [facility] MD Progress Note  02/01/2024 1:22 PM Jeremiah Newman  MRN:  [id number]  33 year old Caucasian male with reported history of schizoaffective disorder, PTSD, polysubstance abuse presented to the emergency department yesterday with chief complaints of auditory hallucinations and suicidal ideations with plan to overdose on medication, either Seroquel or Remeron. Patient reported to ED provider that he has been having difficulty sleeping, chronic headaches. While in ED reported that he had a shot of liquor yesterday, per [facility] patient reported that he last used methamphetamine and fentanyl about a week ago. Patient was admitted voluntarily.    Subjective:   Patient's case is discussed in multidisciplinary team, all vitals, labs and notes were reviewed.  No behavioral issues reported overnight.  Patient is seen for reassessment, reports that he is not doing too well.  That he continues to be significantly depressed and anxious, rating both as a 10 out of 10.  States that he has no appetite, that he has only had a milk during meals.  That he only slept about 3 to 4 hours overnight.  Continues to be focused on long-term placement at either mental health facility or substance use treatment facility.  He is denying going to groups yesterday.  Per chart review, he did attend group yesterday and was engaged and participated.  States that his mother has alcohol use disorder.  Has a maternal great grandfather who died by self-inflicted gunshot.  He continues to endorse suicidal ideations, his current plan is to starve himself.  Continues to report that he is still having hallucinations, all the time.  No reports of the patient or Tis, appearing to be internally preoccupied.  He is able to carry on a linear conversation, does not appear to be distracted.  He has been calm and cooperative, interacting appropriately with others, he has been compliant.  Has been observed eating his meals.  No overt symptoms of  psychosis/mania.   Principal Problem: Schizoaffective disorder, bipolar type (HCC) Diagnosis: Principal Problem:   Schizoaffective disorder, bipolar type (HCC)  Total Time spent with patient: 20 minutes  Past Psychiatric History:   Alcohol abuse, cocaine abuse, PTSD, schizoaffective disorder, bipolar type.  Multiple previous inpatient hospitalizations, most recent [facility] January 2025. Not established with outpatient.  Multiple previous suicide attempts, 1 year ago via hanging while incarcerated, most recently reports that he was attempting to "drink myself to death 3 months ago".  Past trials of Remeron, Lexapro, Zyprexa, Lithium, hydroxyzine, Paxil, Prozac, Lamictal, Seroquel (reports that there have been no meds that he has stabilized on)   Past Medical History:  Past Medical History:  Diagnosis Date   Schizophrenia (HCC)    History reviewed. No pertinent surgical history. Family History: History reviewed. No pertinent family history. Family Psychiatric  History: sister - suicide per chart review  Social History:  Social History   Substance and Sexual Activity  Alcohol Use Yes     Social History   Substance and Sexual Activity  Drug Use No    Social History   Socioeconomic History   Marital status: Single    Spouse name: Not on file   Number of children: Not on file   Years of education: Not on file   Highest education level: Not on file  Occupational History   Not on file  Tobacco Use   Smoking status: Some Days    Current packs/day: 1.00    Average packs/day: 1 pack/day for 9.2 years (9.2 ttl pk-yrs)    Types: Cigarettes  Start date: 2016   Smokeless tobacco: Never  Vaping Use   Vaping status: Every Day  Substance and Sexual Activity   Alcohol use: Yes   Drug use: No   Sexual activity: Not Currently  Other Topics Concern   Not on file  Social History Narrative   Not on file   Social Drivers of Health   Financial Resource Strain: Not on file  Food  Insecurity: No Food Insecurity (01/30/2024)   Hunger Vital Sign    Worried About Running Out of Food in the Last Year: Never true    Ran Out of Food in the Last Year: Never true  Recent Concern: Food Insecurity - Food Insecurity Present (12/18/2023)   Hunger Vital Sign    Worried About Running Out of Food in the Last Year: Often true    Ran Out of Food in the Last Year: Often true  Transportation Needs: No Transportation Needs (01/30/2024)   PRAPARE - Administrator, Civil Service (Medical): No    Lack of Transportation (Non-Medical): No  Recent Concern: Transportation Needs - Unmet Transportation Needs (12/18/2023)   PRAPARE - Administrator, Civil Service (Medical): Yes    Lack of Transportation (Non-Medical): Yes  Physical Activity: Not on file  Stress: Not on file  Social Connections: Unknown (01/30/2024)   Social Connection and Isolation Panel [NHANES]    Frequency of Communication with Friends and Family: More than three times a week    Frequency of Social Gatherings with Friends and Family: More than three times a week    Attends Religious Services: Patient declined    Database administrator or Organizations: No    Attends Engineer, structural: Never    Marital Status: Never married   Additional Social History:                         Sleep: Good  Appetite:  Good  Current Medications: Current Facility-Administered Medications  Medication Dose Route Frequency Provider Last Rate Last Admin   acetaminophen (TYLENOL) tablet 650 mg  650 mg Oral Q6H PRN Tkeyah Burkman, PA-C       alum & mag hydroxide-simeth (MAALOX/MYLANTA) 200-200-20 MG/5ML suspension 30 mL  30 mL Oral Q4H PRN Karla Vines, PA-C       haloperidol (HALDOL) tablet 5 mg  5 mg Oral TID PRN Zelena Bushong, PA-C       And   diphenhydrAMINE (BENADRYL) capsule 50 mg  50 mg Oral TID PRN Sylvi Rybolt, PA-C       haloperidol lactate (HALDOL) injection 5 mg   5 mg Intramuscular TID PRN Mykela Mewborn, PA-C       And   diphenhydrAMINE (BENADRYL) injection 50 mg  50 mg Intramuscular TID PRN Lucelia Lacey, PA-C       And   LORazepam (ATIVAN) injection 2 mg  2 mg Intramuscular TID PRN Ivonne Freeburg, PA-C       haloperidol lactate (HALDOL) injection 10 mg  10 mg Intramuscular TID PRN Charlita Brian, PA-C       And   diphenhydrAMINE (BENADRYL) injection 50 mg  50 mg Intramuscular TID PRN Delainee Tramel, PA-C       And   LORazepam (ATIVAN) injection 2 mg  2 mg Intramuscular TID PRN Onda Kattner, PA-C       hydrOXYzine (ATARAX) tablet 50 mg  50 mg Oral TID PRN Uniqua Kihn, PA-C   50 mg at 01/31/24  2155   lithium carbonate (LITHOBID) ER tablet 300 mg  300 mg Oral Q12H Ajahnae Rathgeber, PA-C   300 mg at 02/01/24 1215   magnesium hydroxide (MILK OF MAGNESIA) suspension 30 mL  30 mL Oral Daily PRN Clarivel Callaway, PA-C       melatonin tablet 5 mg  5 mg Oral QHS Tikisha Molinaro, PA-C       nicotine polacrilex (NICORETTE) gum 2 mg  2 mg Oral PRN Adonias Demore, PA-C   2 mg at 02/01/24 1316   paliperidone (INVEGA) 24 hr tablet 6 mg  6 mg Oral Daily Surafel Hilleary, PA-C   6 mg at 02/01/24 4132   traZODone (DESYREL) tablet 100 mg  100 mg Oral QHS Gardenia Witter, PA-C        Lab Results: No results found for this or any previous visit (from the past 48 hours).  Blood Alcohol level:  Lab Results  Component Value Date   ETH <10 01/30/2024   ETH <10 12/18/2023    Metabolic Disorder Labs: Lab Results  Component Value Date   HGBA1C 4.7 (L) 12/23/2023   MPG 88.19 12/23/2023   MPG 93.93 10/10/2023   No results found for: "PROLACTIN" Lab Results  Component Value Date   CHOL 215 (H) 12/23/2023   TRIG 297 (H) 12/23/2023   HDL 40 (L) 12/23/2023   CHOLHDL 5.4 12/23/2023   VLDL 59 (H) 12/23/2023   LDLCALC 116 (H) 12/23/2023   LDLCALC 146 (H) 10/10/2023    Physical Findings: AIMS:  , ,  ,   ,    CIWA:    COWS:     Musculoskeletal: Strength & Muscle Tone: within normal limits Gait & Station: normal Patient leans: N/A  Psychiatric Specialty Exam:  Presentation  General Appearance:  Disheveled  Eye Contact: Good  Speech: Normal Rate  Speech Volume: Normal  Handedness: Right   Mood and Affect  Mood: Depressed; Anxious; Hopeless  Affect: Appropriate   Thought Process  Thought Processes: Coherent  Descriptions of Associations:Intact  Orientation:Full (Time, Place and Person)  Thought Content:Paranoid Ideation  History of Schizophrenia/Schizoaffective disorder:Yes  Duration of Psychotic Symptoms:Greater than six months  Hallucinations:Hallucinations: Auditory; Visual Description of Auditory Hallucinations: Reports that there are a lot of voices, " as housing whispers", present all day every day Description of Visual Hallucinations: Shadows with faces  Ideas of Reference:Paranoia  Suicidal Thoughts:Suicidal Thoughts: Yes, Active SI Active Intent and/or Plan: Without Intent; With Plan (plan to starve himself, however has been eating meals)  Homicidal Thoughts:Homicidal Thoughts: No   Sensorium  Memory: Immediate Good; Recent Good  Judgment: Fair  Insight: Good   Executive Functions  Concentration: Good  Attention Span: Good  Recall: Good  Fund of Knowledge: Good  Language: Good   Psychomotor Activity  Psychomotor Activity: Psychomotor Activity: Normal   Assets  Assets: Communication Skills; Desire for Improvement; Physical Health; Social Support   Sleep  Sleep: Sleep: Poor Number of Hours of Sleep: -1 (per pt report)    Physical Exam: Physical Exam Vitals and nursing note reviewed.  HENT:     Head: Normocephalic and atraumatic.  Eyes:     Extraocular Movements: Extraocular movements intact.  Pulmonary:     Effort: Pulmonary effort is normal.  Musculoskeletal:        General: Normal range of  motion.     Cervical back: Normal range of motion.  Skin:    General: Skin is dry.  Neurological:     General: No focal deficit present.  Mental Status: He is alert and oriented to person, place, and time.  Psychiatric:        Attention and Perception: Attention normal.        Mood and Affect: Affect normal.        Speech: Speech normal.        Behavior: Behavior normal. Behavior is cooperative.        Thought Content: Thought content is paranoid.        Cognition and Memory: Cognition and memory normal.        Judgment: Judgment is impulsive.    Review of Systems  Psychiatric/Behavioral:  Positive for depression, hallucinations and suicidal ideas. The patient is nervous/anxious and has insomnia.   All other systems reviewed and are negative.  Blood pressure (!) 105/58, pulse 67, temperature 98.8 F (37.1 C), resp. rate 20, height 5\' 7"  (1.702 m), weight 98.4 kg, SpO2 97%. Body mass index is 33.99 kg/m.   Treatment Plan Summary: Schizoaffective disorder, bipolar type (HCC)   1.    Safety and Monitoring:   --  Voluntary admission to inpatient psychiatric unit for safety, stabilization and treatment -- Daily contact with patient to assess and evaluate symptoms and progress in treatment -- Patient's case to be discussed in multi-disciplinary team meeting -- Observation Level : q15 minute checks -- Vital signs:  q12 hours -- Precautions: suicide   2. Psychiatric Diagnoses and Treatment:   01/31/2024 -- Start Lithium 300 mg BID for mood stabilization/depressive sxs reported -- Continue Invega 6 mg daily for schizoaffective disorder. Will plan for LAI -- Hydroxyzine 50 mg TID PRN for anxiety -- Trazodone 50 mg HS for sleep disturbances/depression   01/31/2024 -- Start Invega 3 mg today, Increase to 6 mg tomorrow, 9 mg the following day for schizoaffective disorder. Will plan for LAI -- Hydroxyzine 50 mg TID PRN for anxiety -- Trazodone 50 mg HS for sleep  disturbances/depression   --  The risks/benefits/side-effects/alternatives to this medication were discussed in detail with the patient and time was given for questions. The patient consents to medication trial.  -- Metabolic profile and EKG monitoring obtained while on an atypical antipsychotic  (BMI: 33.99;  HbgA1c: 4.7; QTc: 408)  -- Encouraged patient to participate in unit milieu and in scheduled group therapies  -- Short Term Goals: Ability to identify changes in lifestyle to reduce recurrence of condition will improve, Ability to verbalize feelings will improve, Ability to disclose and discuss suicidal ideas, Ability to demonstrate self-control will improve, Ability to identify and develop effective coping behaviors will improve, Ability to maintain clinical measurements within normal limits will improve, Compliance with prescribed medications will improve, and Ability to identify triggers associated with substance abuse/mental health issues will improve -- Long Term Goals: Improvement in symptoms so as ready for discharge        3. Medical Issues Being Addressed:               Tobacco Use Disorder             -- Nicotine gum 2mg  PRN             -- Smoking cessation encouraged   4. Discharge Planning:   -- Social work and case management to assist with discharge planning and identification of hospital follow-up needs prior to discharge -- Estimated LOS: 5-7 days -- Discharge Concerns: Need to establish a safety plan; Medication compliance and effectiveness -- Discharge Goals: Return home with outpatient referrals for mental health follow-up including  medication management/psychotherapy    Paulene Floor, PA-C 02/01/2024, 1:22 PM

## 2024-02-01 NOTE — Progress Notes (Signed)
   01/31/24 2000  Psych Admission Type (Psych Patients Only)  Admission Status Voluntary  Psychosocial Assessment  Patient Complaints Anxiety  Eye Contact Fair  Facial Expression Animated  Affect Appropriate to circumstance  Speech Soft  Interaction Assertive  Motor Activity Slow  Appearance/Hygiene Improved  Behavior Characteristics Cooperative;Appropriate to situation;Calm  Mood Pleasant  Thought Process  Coherency WDL  Content WDL  Delusions WDL  Perception WDL  Hallucination None reported or observed  Judgment Limited  Confusion WDL  Danger to Self  Current suicidal ideation? Denies  Danger to Others  Danger to Others None reported or observed   Patient alert and oriented x 4, affect is congruent with mood, his thoughts are organized and and coherent, he denies SI/HI/AVH interacting appropriately with peers and staff.

## 2024-02-01 NOTE — Progress Notes (Signed)
   01/31/24 2000  Psych Admission Type (Psych Patients Only)  Admission Status Voluntary  Psychosocial Assessment  Patient Complaints Anxiety  Eye Contact Fair  Facial Expression Animated  Affect Appropriate to circumstance  Speech Soft  Interaction Assertive  Motor Activity Slow  Appearance/Hygiene Improved  Behavior Characteristics Cooperative;Appropriate to situation;Calm  Mood Pleasant  Thought Process  Coherency WDL  Content WDL  Delusions WDL  Perception WDL  Hallucination None reported or observed  Judgment Limited  Confusion WDL  Danger to Self  Current suicidal ideation? Denies  Danger to Others  Danger to Others None reported or observed   Patient alert and oriented x 4, affect is flat, but brightens upon approach., his thoughts are organized and coherent, mood is congruent with affect, he denies SI/HI/AVH. Patient is interacting appropriately with peers an staff. 15 minutes safety checks maintained.

## 2024-02-02 DIAGNOSIS — F250 Schizoaffective disorder, bipolar type: Secondary | ICD-10-CM | POA: Diagnosis not present

## 2024-02-02 MED ORDER — TRAZODONE HCL 50 MG PO TABS
150.0000 mg | ORAL_TABLET | Freq: Every day | ORAL | Status: DC
  Administered 2024-02-02 – 2024-02-03 (×2): 150 mg via ORAL
  Filled 2024-02-02 (×2): qty 1

## 2024-02-02 MED ORDER — NICOTINE POLACRILEX 2 MG MT GUM
4.0000 mg | CHEWING_GUM | OROMUCOSAL | Status: DC | PRN
  Administered 2024-02-02 – 2024-02-07 (×15): 4 mg via ORAL
  Filled 2024-02-02 (×19): qty 2

## 2024-02-02 NOTE — Group Note (Signed)
 Date:  02/02/2024 Time:  12:10 PM  Group Topic/Focus:  Coping With Mental Health Crisis:   The purpose of this group is to help patients identify strategies for coping with mental health crisis.  Group discusses possible causes of crisis and ways to manage them effectively.    Participation Level:  Active  Participation Quality:  Appropriate  Affect:  Appropriate  Cognitive:  Appropriate  Insight: Appropriate  Engagement in Group:  Engaged  Modes of Intervention:  Activity  Additional Comments:    Mary Sella James Lafalce 02/02/2024, 12:10 PM

## 2024-02-02 NOTE — Progress Notes (Signed)
 [facility] MD Progress Note  02/02/2024 12:13 PM Jeremiah Newman  MRN:  [id number]  33 year old Caucasian male with reported history of schizoaffective disorder, PTSD, polysubstance abuse presented to the emergency department yesterday with chief complaints of auditory hallucinations and suicidal ideations with plan to overdose on medication, either Seroquel or Remeron. Patient reported to ED provider that he has been having difficulty sleeping, chronic headaches. While in ED reported that he had a shot of liquor yesterday, per [facility] patient reported that he last used methamphetamine and fentanyl about a week ago. Patient was admitted voluntarily.    Subjective:   Patient's case is discussed in multidisciplinary team, all vitals, labs and notes were reviewed.  No behavioral issues reported overnight.  Patient is seen for reassessment, reports that he is not doing too well.  Continues to endorse depressed mood "because I miss my family" and anxiety "through the roof". "I'm better when I'm talking to people, but when I'm alone I feel sad". On chart review, no PRN's administered for anxiety since 2/28. States that he has no appetite and that he did not eat dinner last evening.  That he only slept about 2 hours overnight, however did not request any PRNs, encouraged him to do so.  Continues to be focused on long-term placement at either mental health facility or substance use treatment facility.  He is denying going to groups yesterday.  Per chart review, he did attend group yesterday and was engaged and participated.  States that his mother has alcohol use disorder.  Has a maternal great grandfather who died by self-inflicted gunshot.  He continues to endorse suicidal ideations, his current plan is to starve himself.  No reports of the patient RTIS, appearing to be internally preoccupied. He has been calm and cooperative, interacting appropriately with others, he has been med compliant.  Staff reports that he is eating  his meals.  No overt symptoms of psychosis/mania.   Principal Problem: Schizoaffective disorder, bipolar type (HCC) Diagnosis: Principal Problem:   Schizoaffective disorder, bipolar type (HCC)  Total Time spent with patient: 20 minutes  Past Psychiatric History:   Alcohol abuse, cocaine abuse, PTSD, schizoaffective disorder, bipolar type.  Multiple previous inpatient hospitalizations, most recent [facility] January [facility]25. Not established with outpatient.  Multiple previous suicide attempts, 1 year ago via hanging while incarcerated, most recently reports that he was attempting to "drink myself to death 3 months ago".  Past trials of Remeron, Lexapro, Zyprexa, Lithium, hydroxyzine, Paxil, Prozac, Lamictal, Seroquel (reports that there have been no meds that he has stabilized on)   Past Medical History:  Past Medical History:  Diagnosis Date   Schizophrenia (HCC)    History reviewed. No pertinent surgical history. Family History: History reviewed. No pertinent family history. Family Psychiatric  History: sister - suicide per chart review  Social History:  Social History   Substance and Sexual Activity  Alcohol Use Yes     Social History   Substance and Sexual Activity  Drug Use No    Social History   Socioeconomic History   Marital status: Single    Spouse name: Not on file   Number of children: Not on file   Years of education: Not on file   Highest education level: Not on file  Occupational History   Not on file  Tobacco Use   Smoking status: Some Days    Current packs/day: 1.00    Average packs/day: 1 pack/day for 9.2 years (9.2 ttl pk-yrs)    Types: Cigarettes  Start date: 2016   Smokeless tobacco: Never  Vaping Use   Vaping status: Every Day  Substance and Sexual Activity   Alcohol use: Yes   Drug use: No   Sexual activity: Not Currently  Other Topics Concern   Not on file  Social History Narrative   Not on file   Social Drivers of Health   Financial  Resource Strain: Not on file  Food Insecurity: No Food Insecurity (01/30/2024)   Hunger Vital Sign    Worried About Running Out of Food in the Last Year: Never true    Ran Out of Food in the Last Year: Never true  Recent Concern: Food Insecurity - Food Insecurity Present (12/18/2023)   Hunger Vital Sign    Worried About Running Out of Food in the Last Year: Often true    Ran Out of Food in the Last Year: Often true  Transportation Needs: No Transportation Needs (01/30/2024)   PRAPARE - Administrator, Civil Service (Medical): No    Lack of Transportation (Non-Medical): No  Recent Concern: Transportation Needs - Unmet Transportation Needs (12/18/2023)   PRAPARE - Administrator, Civil Service (Medical): Yes    Lack of Transportation (Non-Medical): Yes  Physical Activity: Not on file  Stress: Not on file  Social Connections: Unknown (01/30/2024)   Social Connection and Isolation Panel [NHANES]    Frequency of Communication with Friends and Family: More than three times a week    Frequency of Social Gatherings with Friends and Family: More than three times a week    Attends Religious Services: Patient declined    Database administrator or Organizations: No    Attends Engineer, structural: Never    Marital Status: Never married   Additional Social History:                         Sleep: Good  Appetite:  Good  Current Medications: Current Facility-Administered Medications  Medication Dose Route Frequency Provider Last Rate Last Admin   acetaminophen (TYLENOL) tablet 650 mg  650 mg Oral Q6H PRN Allen Egerton, PA-C       alum & mag hydroxide-simeth (MAALOX/MYLANTA) 200-200-20 MG/5ML suspension 30 mL  30 mL Oral Q4H PRN Javonda Suh, PA-C       haloperidol (HALDOL) tablet 5 mg  5 mg Oral TID PRN Shahzad Thomann, PA-C       And   diphenhydrAMINE (BENADRYL) capsule 50 mg  50 mg Oral TID PRN Ishaan Villamar, PA-C        haloperidol lactate (HALDOL) injection 5 mg  5 mg Intramuscular TID PRN Imari Sivertsen, PA-C       And   diphenhydrAMINE (BENADRYL) injection 50 mg  50 mg Intramuscular TID PRN Navada Osterhout, PA-C       And   LORazepam (ATIVAN) injection 2 mg  2 mg Intramuscular TID PRN Darryn Kydd, PA-C       haloperidol lactate (HALDOL) injection 10 mg  10 mg Intramuscular TID PRN Schuyler Behan, PA-C       And   diphenhydrAMINE (BENADRYL) injection 50 mg  50 mg Intramuscular TID PRN Ladarius Seubert, PA-C       And   LORazepam (ATIVAN) injection 2 mg  2 mg Intramuscular TID PRN Talon Witting, PA-C       hydrOXYzine (ATARAX) tablet 50 mg  50 mg Oral TID PRN Anaiya Wisinski, PA-C   50 mg at 01/31/24  2155   lithium carbonate (LITHOBID) ER tablet 300 mg  300 mg Oral Q12H Deavin Forst, PA-C   300 mg at 02/02/24 0915   magnesium hydroxide (MILK OF MAGNESIA) suspension 30 mL  30 mL Oral Daily PRN Nevena Rozenberg, PA-C       melatonin tablet 5 mg  5 mg Oral QHS Surabhi Gadea, PA-C   5 mg at 02/01/24 2134   nicotine polacrilex (NICORETTE) gum 4 mg  4 mg Oral PRN [name], MD       paliperidone (INVEGA) 24 hr tablet 6 mg  6 mg Oral Daily Damarie Schoolfield, PA-C   6 mg at 02/02/24 0915   traZODone (DESYREL) tablet 100 mg  100 mg Oral QHS Tejal Monroy, PA-C   100 mg at 02/01/24 2134    Lab Results: No results found for this or any previous visit (from the past 48 hours).  Blood Alcohol level:  Lab Results  Component Value Date   ETH <10 01/30/2024   ETH <10 12/18/2023    Metabolic Disorder Labs: Lab Results  Component Value Date   HGBA1C 4.7 (L) 12/23/2023   MPG 88.19 12/23/2023   MPG 93.93 10/10/2023   No results found for: "PROLACTIN" Lab Results  Component Value Date   CHOL 215 (H) 12/23/2023   TRIG 297 (H) 12/23/2023   HDL 40 (L) 12/23/2023   CHOLHDL 5.4 12/23/2023   VLDL 59 (H) 12/23/2023   LDLCALC 116 (H) 12/23/2023    LDLCALC 146 (H) 10/10/2023    Physical Findings: AIMS:  , ,  ,  ,    CIWA:    COWS:     Musculoskeletal: Strength & Muscle Tone: within normal limits Gait & Station: normal Patient leans: N/A  Psychiatric Specialty Exam:  Presentation  General Appearance:  Disheveled  Eye Contact: Good  Speech: Normal Rate  Speech Volume: Normal  Handedness: Right   Mood and Affect  Mood: Depressed; Anxious; Hopeless  Affect: Appropriate   Thought Process  Thought Processes: Coherent  Descriptions of Associations:Intact  Orientation:Full (Time, Place and Person)  Thought Content:Paranoid Ideation  History of Schizophrenia/Schizoaffective disorder:Yes  Duration of Psychotic Symptoms:Greater than six months  Hallucinations:Hallucinations: Auditory; Visual  Ideas of Reference:Paranoia  Suicidal Thoughts:Suicidal Thoughts: Yes, Active SI Active Intent and/or Plan: Without Intent; With Plan (plan to starve himself, however has been eating meals)  Homicidal Thoughts:Homicidal Thoughts: No   Sensorium  Memory: Immediate Good; Recent Good  Judgment: Fair  Insight: Good   Executive Functions  Concentration: Good  Attention Span: Good  Recall: Good  Fund of Knowledge: Good  Language: Good   Psychomotor Activity  Psychomotor Activity: Psychomotor Activity: Normal   Assets  Assets: Communication Skills; Desire for Improvement; Physical Health; Social Support   Sleep  Sleep: Sleep: Poor Number of Hours of Sleep: -1 (per pt report)    Physical Exam: Physical Exam Vitals and nursing note reviewed.  HENT:     Head: Normocephalic and atraumatic.  Eyes:     Extraocular Movements: Extraocular movements intact.  Pulmonary:     Effort: Pulmonary effort is normal.  Musculoskeletal:        General: Normal range of motion.     Cervical back: Normal range of motion.  Skin:    General: Skin is dry.  Neurological:     General: No focal  deficit present.     Mental Status: He is alert and oriented to person, place, and time.  Psychiatric:  Attention and Perception: Attention and perception normal.        Mood and Affect: Affect normal. Mood is depressed.        Speech: Speech normal.        Behavior: Behavior normal. Behavior is cooperative.        Thought Content: Thought content normal.        Cognition and Memory: Cognition and memory normal.        Judgment: Judgment is impulsive.    Review of Systems  Psychiatric/Behavioral:  Positive for depression and suicidal ideas. The patient is nervous/anxious and has insomnia.   All other systems reviewed and are negative.  Blood pressure 110/83, pulse 73, temperature (!) 97 F (36.1 C), resp. rate 18, height 5\' 7"  (1.702 m), weight 98.4 kg, SpO2 99%. Body mass index is 33.99 kg/m.   Treatment Plan Summary: Schizoaffective disorder, bipolar type (HCC)   1.    Safety and Monitoring:   --  Voluntary admission to inpatient psychiatric unit for safety, stabilization and treatment -- Daily contact with patient to assess and evaluate symptoms and progress in treatment -- Patient's case to be discussed in multi-disciplinary team meeting -- Observation Level : q15 minute checks -- Vital signs:  q12 hours -- Precautions: suicide   2. Psychiatric Diagnoses and Treatment: 02/02/2024 -- Continue Lithium 300 mg BID for mood stabilization/depressive sxs reported -- Continue Invega 6 mg daily for schizoaffective disorder. Will plan for LAI -- Continue Hydroxyzine 50 mg TID PRN for anxiety -- Increase Trazodone to 150 mg HS for sleep disturbances/depression   02/01/2024 -- Start Lithium 300 mg BID for mood stabilization/depressive sxs reported -- Continue Invega 6 mg daily for schizoaffective disorder. Will plan for LAI -- Hydroxyzine 50 mg TID PRN for anxiety -- Trazodone 50 mg HS for sleep disturbances/depression   01/31/2024 -- Start Invega 3 mg today, Increase to 6 mg  tomorrow, 9 mg the following day for schizoaffective disorder. Will plan for LAI -- Hydroxyzine 50 mg TID PRN for anxiety -- Trazodone 50 mg HS for sleep disturbances/depression   --  The risks/benefits/side-effects/alternatives to this medication were discussed in detail with the patient and time was given for questions. The patient consents to medication trial.  -- Metabolic profile and EKG monitoring obtained while on an atypical antipsychotic  (BMI: 33.99;  HbgA1c: 4.7; QTc: 408)  -- Encouraged patient to participate in unit milieu and in scheduled group therapies  -- Short Term Goals: Ability to identify changes in lifestyle to reduce recurrence of condition will improve, Ability to verbalize feelings will improve, Ability to disclose and discuss suicidal ideas, Ability to demonstrate self-control will improve, Ability to identify and develop effective coping behaviors will improve, Ability to maintain clinical measurements within normal limits will improve, Compliance with prescribed medications will improve, and Ability to identify triggers associated with substance abuse/mental health issues will improve -- Long Term Goals: Improvement in symptoms so as ready for discharge        3. Medical Issues Being Addressed:               Tobacco Use Disorder             -- Nicotine gum 2mg  PRN             -- Smoking cessation encouraged   4. Discharge Planning:   -- Social work and case management to assist with discharge planning and identification of hospital follow-up needs prior to discharge -- Estimated LOS: 5-7 days --  Discharge Concerns: Need to establish a safety plan; Medication compliance and effectiveness -- Discharge Goals: Return home with outpatient referrals for mental health follow-up including medication management/psychotherapy    Paulene Floor, PA-C 02/02/2024, 12:13 PM

## 2024-02-02 NOTE — Plan of Care (Signed)

## 2024-02-02 NOTE — Group Note (Signed)
 Date:  02/02/2024 Time:  8:58 PM  Group Topic/Focus:  Wrap-Up Group:   The focus of this group is to help patients review their daily goal of treatment and discuss progress on daily workbooks.    Participation Level:  Active  Participation Quality:  Appropriate  Affect:  Appropriate  Cognitive:  Appropriate  Insight: Appropriate  Engagement in Group:  Engaged  Modes of Intervention:  Discussion   Lenore Cordia 02/02/2024, 8:58 PM

## 2024-02-02 NOTE — Progress Notes (Signed)
   02/01/24 2000  Psych Admission Type (Psych Patients Only)  Admission Status Voluntary  Psychosocial Assessment  Patient Complaints None  Eye Contact Fair  Facial Expression Animated  Affect Appropriate to circumstance  Speech Soft  Interaction Assertive  Motor Activity Slow  Appearance/Hygiene Improved  Behavior Characteristics Cooperative;Appropriate to situation;Calm  Mood Pleasant  Thought Process  Coherency WDL  Content WDL  Delusions WDL  Perception WDL  Hallucination None reported or observed  Judgment Limited  Confusion WDL  Danger to Self  Current suicidal ideation? Denies  Danger to Others  Danger to Others None reported or observed   No distress noted interacting appropriately with peers and staff, compliant with medication regimen, no  distress noted will continue to monitor.

## 2024-02-02 NOTE — Plan of Care (Signed)
  Problem: Education: Goal: Knowledge of [facility] Education information/materials will improve Outcome: Progressing   Problem: Education: Goal: Emotional status will improve Outcome: Progressing   Problem: Education: Goal: Mental status will improve Outcome: Progressing   

## 2024-02-02 NOTE — Progress Notes (Signed)
   02/02/24 1100  Psych Admission Type (Psych Patients Only)  Admission Status Voluntary  Psychosocial Assessment  Patient Complaints None  Eye Contact Fair  Facial Expression Animated  Affect Appropriate to circumstance  Speech Soft  Interaction Assertive  Motor Activity Slow  Appearance/Hygiene Improved  Behavior Characteristics Cooperative  Mood Pleasant  Thought Process  Coherency WDL  Content WDL  Delusions None reported or observed  Perception WDL  Hallucination None reported or observed  Judgment Limited  Confusion WDL  Danger to Self  Current suicidal ideation? Denies

## 2024-02-03 DIAGNOSIS — F250 Schizoaffective disorder, bipolar type: Secondary | ICD-10-CM | POA: Diagnosis not present

## 2024-02-03 NOTE — Progress Notes (Signed)
   02/02/24 2000  Psych Admission Type (Psych Patients Only)  Admission Status Voluntary  Psychosocial Assessment  Patient Complaints None  Eye Contact Fair  Facial Expression Animated  Affect Appropriate to circumstance  Speech Soft  Interaction Assertive  Motor Activity Slow  Appearance/Hygiene Improved  Behavior Characteristics Cooperative;Appropriate to situation  Mood Pleasant  Thought Process  Coherency WDL  Content WDL  Delusions WDL  Perception WDL  Hallucination None reported or observed  Judgment Limited  Confusion WDL  Danger to Self  Current suicidal ideation? Denies  Danger to Others  Danger to Others None reported or observed   Patient alert and oriented x 4, affect is congruent with mood, he denies SI/HI/AVH, 15 minutes safety checks maintained.

## 2024-02-03 NOTE — Group Note (Signed)
 Recreation Therapy Group Note   Group Topic:Goal Setting  Group Date: 02/03/2024 Start Time: 1040 End Time: 1140 Facilitators: Rosina Lowenstein, LRT, CTRS Location:  Craft Room  Group Description: Product/process development scientist. Patients were given many different magazines, a glue stick, markers, and a piece of cardstock paper. LRT and pts discussed the importance of having goals in life. LRT and pts discussed the difference between short-term and long-term goals, as well as what a SMART goal is. LRT encouraged pts to create a vision board, with images they picked and then cut out with safety scissors from the magazine, for themselves, that capture their short and long-term goals. LRT encouraged pts to show and explain their vision board to the group.   Goal Area(s) Addressed:  Patient will gain knowledge of short vs. long term goals.  Patient will identify goals for themselves. Patient will practice setting SMART goals. Patient will verbalize their goals to LRT and peers.   Affect/Mood: Appropriate   Participation Level: Active and Engaged   Participation Quality: Independent   Behavior: Appropriate, Calm, and Cooperative   Speech/Thought Process: Coherent   Insight: Fair   Judgement: Good   Modes of Intervention: Art, Education, Exploration, Group work, Scientific laboratory technician, and Support   Patient Response to Interventions:  Attentive, Engaged, Interested , and Receptive   Education Outcome:  Acknowledges education   Clinical Observations/Individualized Feedback: Cruise was active in their participation of session activities and group discussion. Pt identified "I want to cook more and have a garden since my mom likes flowers" as his goals. Pt appropriately identified images to refect these goals. Pt interacted well with LRT and peers duration of session.    Plan: Continue to engage patient in RT group sessions 2-3x/week.   Rosina Lowenstein, LRT, CTRS 02/03/2024 1:16 PM

## 2024-02-03 NOTE — Plan of Care (Signed)
  Problem: Activity: Goal: Interest or engagement in activities will improve 02/03/2024 1710 by Khali Perella, Margo Common, RN Outcome: Progressing   Problem: Coping: Goal: Ability to verbalize frustrations and anger appropriately will improve 02/03/2024 1603 by Eyoel Throgmorton, Margo Common, RN Outcome: Progressing   Problem: Education: Goal: Emotional status will improve 02/03/2024 1539 by Yelina Sarratt, Margo Common, RN Outcome: Progressing   Problem: Health Behavior/Discharge Planning: Goal: Identification of resources available to assist in meeting health care needs will improve 02/03/2024 1603 by Alexey Rhoads, Margo Common, RN Outcome: Progressing   Problem: Safety: Goal: Periods of time without injury will increase Outcome: Progressing

## 2024-02-03 NOTE — Plan of Care (Signed)
?  Problem: Education: ?Goal: Knowledge of [facility] Education information/materials will improve ?Outcome: Progressing ?Goal: Emotional status will improve ?Outcome: Progressing ?  ?Problem: Safety: ?Goal: Periods of time without injury will increase ?Outcome: Progressing ?  ?

## 2024-02-03 NOTE — Group Note (Signed)
 Date:  02/03/2024 Time:  11:35 AM  Group Topic/Focus:  Goals Group:   The focus of this group is to help patients establish daily goals to achieve during treatment and discuss how the patient can incorporate goal setting into their daily lives to aide in recovery.  Participation Level:  Active  Participation Quality:  Appropriate  Affect:  Appropriate  Cognitive:  Appropriate  Insight: Appropriate  Engagement in Group:  Engaged  Modes of Intervention:  Discussion and Education  Additional Comments:    Jeremiah Newman A [name] 02/03/2024, 11:35 AM

## 2024-02-03 NOTE — Progress Notes (Signed)
   02/03/24 1100  Psych Admission Type (Psych Patients Only)  Admission Status Voluntary  Psychosocial Assessment  Patient Complaints None  Eye Contact Fair  Facial Expression Animated  Affect Appropriate to circumstance  Speech Soft  Interaction Assertive  Motor Activity Slow  Appearance/Hygiene Unremarkable  Behavior Characteristics Cooperative;Appropriate to situation  Mood Pleasant  Thought Process  Coherency WDL  Content WDL  Delusions None reported or observed  Perception WDL  Hallucination None reported or observed  Judgment Impaired  Confusion None  Danger to Self  Current suicidal ideation? Denies  Danger to Others  Danger to Others None reported or observed   Appropriate with staff & peers. Appetite and energy level good. ADLs maintained. Support and encouragement given.

## 2024-02-03 NOTE — Plan of Care (Deleted)
  Problem: Education: Goal: Emotional status will improve 02/03/2024 1539 by Chip Canepa, Margo Common, RN Outcome: Progressing 02/03/2024 1538 by Leonarda Salon, RN Outcome: Progressing   Problem: Education: Goal: Mental status will improve 02/03/2024 1603 by Leonarda Salon, RN Outcome: Progressing 02/03/2024 1536 by Leonarda Salon, RN Outcome: Progressing   Problem: Activity: Goal: Interest or engagement in activities will improve 02/03/2024 1603 by Brynnlie Unterreiner, Margo Common, RN Outcome: Progressing 02/03/2024 1539 by Leonarda Salon, RN Outcome: Progressing 02/03/2024 1536 by Leonarda Salon, RN Outcome: Progressing   Problem: Health Behavior/Discharge Planning: Goal: Identification of resources available to assist in meeting health care needs will improve 02/03/2024 1603 by Esteen Delpriore, Margo Common, RN Outcome: Progressing 02/03/2024 1539 by Leonarda Salon, RN Outcome: Progressing 02/03/2024 1536 by Leonarda Salon, RN Outcome: Progressing

## 2024-02-03 NOTE — Plan of Care (Signed)
  Problem: Education: Goal: Knowledge of [facility] Education information/materials will improve Outcome: Progressing   Problem: Education: Goal: Mental status will improve Outcome: Progressing   Problem: Education: Goal: Verbalization of understanding the information provided will improve Outcome: Progressing   

## 2024-02-03 NOTE — Group Note (Signed)
 LCSW Group Therapy Note   Group Date: 02/03/2024 Start Time: 1300 End Time: 1418   Type of Therapy and Topic:  Group Therapy: Challenging Core Beliefs  Participation Level:  Active  Description of Group:  Patients were educated about core beliefs and asked to identify one harmful core belief that they have. Patients were asked to explore from where those beliefs originate. Patients were asked to discuss how those beliefs make them feel and the resulting behaviors of those beliefs. They were then be asked if those beliefs are true and, if so, what evidence they have to support them. Lastly, group members were challenged to replace those negative core beliefs with helpful beliefs.   Therapeutic Goals:   1. Patient will identify harmful core beliefs and explore the origins of such beliefs. 2. Patient will identify feelings and behaviors that result from those core beliefs. 3. Patient will discuss whether such beliefs are true. 4.  Patient will replace harmful core beliefs with helpful ones.  Summary of Patient Progress:  Patient actively engaged in processing and exploring how core beliefs are formed and how they impact thoughts, feelings, and behaviors. Patient proved open to input from peers and feedback from CSW. Patient demonstrated proficient insight into the subject matter, was respectful and supportive of peers, and participated throughout the entire session.  Therapeutic Modalities: Cognitive Behavioral Therapy; Solution-Focused Therapy   Lowry Ram, LCSWA 02/03/2024  2:20 PM

## 2024-02-03 NOTE — Group Note (Signed)
 Date:  02/03/2024 Time:  8:47 PM  Group Topic/Focus:  Wrap-Up Group:   The focus of this group is to help patients review their daily goal of treatment and discuss progress on daily workbooks.    Participation Level:  Active  Participation Quality:  Appropriate and Attentive  Affect:  Appropriate  Cognitive:  Appropriate  Insight: Appropriate  Engagement in Group:  Supportive  Modes of Intervention:  Discussion  Additional Comments:     [name] 02/03/2024, 8:47 PM

## 2024-02-04 DIAGNOSIS — F250 Schizoaffective disorder, bipolar type: Secondary | ICD-10-CM | POA: Diagnosis not present

## 2024-02-04 MED ORDER — MELATONIN 5 MG PO TABS
10.0000 mg | ORAL_TABLET | Freq: Every day | ORAL | Status: DC
  Administered 2024-02-04 – 2024-02-06 (×3): 10 mg via ORAL
  Filled 2024-02-04 (×3): qty 2

## 2024-02-04 MED ORDER — MIRTAZAPINE 15 MG PO TABS
7.5000 mg | ORAL_TABLET | Freq: Every day | ORAL | Status: DC
  Administered 2024-02-04 – 2024-02-06 (×3): 7.5 mg via ORAL
  Filled 2024-02-04 (×3): qty 1

## 2024-02-04 MED ORDER — TRAZODONE HCL 100 MG PO TABS
200.0000 mg | ORAL_TABLET | Freq: Every evening | ORAL | Status: DC | PRN
Start: 2024-02-04 — End: 2024-02-07
  Administered 2024-02-04: 200 mg via ORAL
  Filled 2024-02-04: qty 2

## 2024-02-04 MED ORDER — TRAZODONE HCL 100 MG PO TABS: 200.0000 mg | ORAL_TABLET | Freq: Every day | ORAL | Status: DC

## 2024-02-04 MED ORDER — PALIPERIDONE ER 3 MG PO TB24
3.0000 mg | ORAL_TABLET | Freq: Every day | ORAL | Status: DC
Start: 2024-02-05 — End: 2024-02-07
  Administered 2024-02-05 – 2024-02-07 (×3): 3 mg via ORAL
  Filled 2024-02-04 (×3): qty 1

## 2024-02-04 NOTE — Group Note (Signed)
 Recreation Therapy Group Note   Group Topic:Coping Skills  Group Date: 02/04/2024 Start Time: 1000 End Time: 1050 Facilitators: Rosina Lowenstein, LRT, CTRS Location:  Craft Room  Group Description: Mind Map.  Patient was provided a blank template of a diagram with 32 blank boxes in a tiered system, branching from the center (similar to a bubble chart). LRT directed patients to label the middle of the diagram "Coping Skills". LRT and patients then came up with 8 different coping skills as examples. Pt were directed to record their coping skills in the 2nd tier boxes closest to the center.  Patients would then share their coping skills with the group as LRT wrote them out. LRT gave a handout of 99 different coping skills at the end of group.   Goal Area(s) Addressed: Patients will be able to define "coping skills". Patient will identify new coping skills.  Patient will increase communication.   Affect/Mood: Appropriate   Participation Level: Moderate   Participation Quality: Independent   Behavior: Calm and Cooperative   Speech/Thought Process: Coherent   Insight: Fair   Judgement: Fair    Modes of Intervention: Clarification, Education, Exploration, Guided Discussion, Worksheet, and Writing   Patient Response to Interventions:  Receptive   Education Outcome:  Acknowledges education   Clinical Observations/Individualized Feedback: Jeremiah Newman was active in their participation of session activities and group discussion. Pt came late to group, however, joined with no issue. Pt identified "eating pizza and going outside" as coping skills. Pt spontaneously contributed to group discussion while interacting well with LRT and peers duration of session.    Plan: Continue to engage patient in RT group sessions 2-3x/week.   Rosina Lowenstein, LRT, CTRS 02/04/2024 12:17 PM

## 2024-02-04 NOTE — Progress Notes (Signed)
 Patient is pleasant and cooperative. Appropriate with staff and peers. Endorses passive SI, when alone. Verbally contracts for safety. Spent most of early shift in dayroom, laughing and socializing with peers. Currently denies SI. No other complaints voiced. Meds given for sleep. Visible in milieu. Encouragement and support provided. Safety checks maintained. Medications given as prescribed. Pt receptive and remains safe on unit with q 15 min checks.

## 2024-02-04 NOTE — Group Note (Signed)
 [facility] LCSW Group Therapy Note   Group Date: 02/04/2024 Start Time: 1330 End Time: 1430  Type of Therapy/Topic:  Group Therapy:  Feelings about Diagnosis  Participation Level:  None   Description of Group:    This group will allow patients to explore their thoughts and feelings about diagnoses they have received. Patients will be guided to explore their level of understanding and acceptance of these diagnoses. Facilitator will encourage patients to process their thoughts and feelings about the reactions of others to their diagnosis, and will guide patients in identifying ways to discuss their diagnosis with significant others in their lives. This group will be process-oriented, with patients participating in exploration of their own experiences as well as giving and receiving support and challenge from other group members.   Therapeutic Goals: 1. Patient will demonstrate understanding of diagnosis as evidence by identifying two or more symptoms of the disorder:  2. Patient will be able to express two feelings regarding the diagnosis 3. Patient will demonstrate ability to communicate their needs through discussion and/or role plays  Summary of Patient Progress: Patient was present in group, however, did not participate in group discussion.    Therapeutic Modalities:   Cognitive Behavioral Therapy Brief Therapy Feelings Identification    Harden Mo, LCSW

## 2024-02-04 NOTE — Plan of Care (Signed)
 Patient pleasant and cooperative on approach. Patient verbalized passive suicidal thoughts when he is alone. Patient contracts for safety. Denies HI and AVH. Appropriate with staff & peers. Support and encouragement given.

## 2024-02-04 NOTE — [facility] Counselor (Signed)
 Requested documentation sent to [facility] on patient's behalf by CSW team.   CSW team to continue to assess.     Reymundo Poll, MSW, LCSWA 02/04/2024 10:22 AM

## 2024-02-04 NOTE — Progress Notes (Signed)
 [facility] MD Progress Note  02/03/2024 11:48 AM GIBBS NAUGLE  MRN:  [id number] Subjective:  33 year old Caucasian male,  presents to the IDT stating, "I want to go into sober living." He expresses a strong desire to continue with a structured sober living environment to maintain recovery. The patient denies any complaints at this time.  Principal Problem: Schizoaffective disorder, bipolar type (HCC) Diagnosis: Principal Problem:   Schizoaffective disorder, bipolar type (HCC)  Total Time spent with patient: 1.5 hours  Past Psychiatric History: see below  Past Medical History:  Past Medical History:  Diagnosis Date   Schizophrenia (HCC)    History reviewed. No pertinent surgical history. Family History: History reviewed. No pertinent family history. Family Psychiatric  History: mother Depression  Social History:  Social History   Substance and Sexual Activity  Alcohol Use Yes     Social History   Substance and Sexual Activity  Drug Use No    Social History   Socioeconomic History   Marital status: Single    Spouse name: Not on file   Number of children: Not on file   Years of education: Not on file   Highest education level: Not on file  Occupational History   Not on file  Tobacco Use   Smoking status: Some Days    Current packs/day: 1.00    Average packs/day: 1 pack/day for 9.2 years (9.2 ttl pk-yrs)    Types: Cigarettes    Start date: 2016   Smokeless tobacco: Never  Vaping Use   Vaping status: Every Day  Substance and Sexual Activity   Alcohol use: Yes   Drug use: No   Sexual activity: Not Currently  Other Topics Concern   Not on file  Social History Narrative   Not on file   Social Drivers of Health   Financial Resource Strain: Not on file  Food Insecurity: No Food Insecurity (01/30/2024)   Hunger Vital Sign    Worried About Running Out of Food in the Last Year: Never true    Ran Out of Food in the Last Year: Never true  Recent Concern: Food Insecurity  - Food Insecurity Present (12/18/2023)   Hunger Vital Sign    Worried About Running Out of Food in the Last Year: Often true    Ran Out of Food in the Last Year: Often true  Transportation Needs: No Transportation Needs (01/30/2024)   PRAPARE - Administrator, Civil Service (Medical): No    Lack of Transportation (Non-Medical): No  Recent Concern: Transportation Needs - Unmet Transportation Needs (12/18/2023)   PRAPARE - Administrator, Civil Service (Medical): Yes    Lack of Transportation (Non-Medical): Yes  Physical Activity: Not on file  Stress: Not on file  Social Connections: Unknown (01/30/2024)   Social Connection and Isolation Panel [NHANES]    Frequency of Communication with Friends and Family: More than three times a week    Frequency of Social Gatherings with Friends and Family: More than three times a week    Attends Religious Services: Patient declined    Database administrator or Organizations: No    Attends Banker Meetings: Never    Marital Status: Never married   Additional Social History:                         Sleep: Fair  Appetite:  Good  Current Medications: Current Facility-Administered Medications  Medication Dose Route Frequency Provider  Last Rate Last Admin   acetaminophen (TYLENOL) tablet 650 mg  650 mg Oral Q6H PRN Tingling, Stephanie, PA-C       alum & mag hydroxide-simeth (MAALOX/MYLANTA) 200-200-20 MG/5ML suspension 30 mL  30 mL Oral Q4H PRN Tingling, Stephanie, PA-C       haloperidol (HALDOL) tablet 5 mg  5 mg Oral TID PRN Tingling, Stephanie, PA-C       And   diphenhydrAMINE (BENADRYL) capsule 50 mg  50 mg Oral TID PRN Tingling, Stephanie, PA-C       haloperidol lactate (HALDOL) injection 5 mg  5 mg Intramuscular TID PRN Tingling, Stephanie, PA-C       And   diphenhydrAMINE (BENADRYL) injection 50 mg  50 mg Intramuscular TID PRN Tingling, Stephanie, PA-C       And   LORazepam (ATIVAN) injection 2 mg   2 mg Intramuscular TID PRN Tingling, Stephanie, PA-C       haloperidol lactate (HALDOL) injection 10 mg  10 mg Intramuscular TID PRN Tingling, Stephanie, PA-C       And   diphenhydrAMINE (BENADRYL) injection 50 mg  50 mg Intramuscular TID PRN Tingling, Stephanie, PA-C       And   LORazepam (ATIVAN) injection 2 mg  2 mg Intramuscular TID PRN Tingling, Stephanie, PA-C       hydrOXYzine (ATARAX) tablet 50 mg  50 mg Oral TID PRN Tingling, Stephanie, PA-C   50 mg at 01/31/24 2155   lithium carbonate (LITHOBID) ER tablet 300 mg  300 mg Oral Q12H Tingling, Stephanie, PA-C   300 mg at 02/04/24 0630   magnesium hydroxide (MILK OF MAGNESIA) suspension 30 mL  30 mL Oral Daily PRN Tingling, Stephanie, PA-C       melatonin tablet 5 mg  5 mg Oral QHS Tingling, Stephanie, PA-C   5 mg at 02/01/24 2134   nicotine polacrilex (NICORETTE) gum 4 mg  4 mg Oral PRN [name], MD   4 mg at 02/04/24 1235   paliperidone (INVEGA) 24 hr tablet 6 mg  6 mg Oral Daily Tingling, Stephanie, PA-C   6 mg at 02/04/24 1601   traZODone (DESYREL) tablet 150 mg  150 mg Oral QHS Tingling, Stephanie, PA-C   150 mg at 02/03/24 2112    Lab Results: No results found for this or any previous visit (from the past 48 hours).  Blood Alcohol level:  Lab Results  Component Value Date   ETH <10 01/30/2024   ETH <10 12/18/2023    Metabolic Disorder Labs: Lab Results  Component Value Date   HGBA1C 4.7 (L) 12/23/2023   MPG 88.19 12/23/2023   MPG 93.93 10/10/2023   No results found for: "PROLACTIN" Lab Results  Component Value Date   CHOL 215 (H) 12/23/2023   TRIG 297 (H) 12/23/2023   HDL 40 (L) 12/23/2023   CHOLHDL 5.4 12/23/2023   VLDL 59 (H) 12/23/2023   LDLCALC 116 (H) 12/23/2023   LDLCALC 146 (H) 10/10/2023    Physical Findings: AIMS:  , ,  ,  ,    CIWA:    COWS:     Musculoskeletal: Strength & Muscle Tone: within normal limits Gait & Station: normal Patient leans: N/A  Psychiatric Specialty  Exam:  Presentation  General Appearance:  Fairly Groomed  Eye Contact: Good  Speech: Clear and Coherent; Normal Rate  Speech Volume: Normal  Handedness: Right   Mood and Affect  Mood: Anxious  Affect: Flat   Thought Process  Thought Processes: Goal Directed;  Coherent  Descriptions of Associations:Intact  Orientation:Full (Time, Place and Person)  Thought Content:Logical; WDL  History of Schizophrenia/Schizoaffective disorder:Yes  Duration of Psychotic Symptoms:Greater than six months  Hallucinations:Hallucinations: None Description of Auditory Hallucinations: denies Description of Visual Hallucinations: denies  Ideas of Reference:None  Suicidal Thoughts:Suicidal Thoughts: No SI Active Intent and/or Plan: -- (denies) SI Passive Intent and/or Plan: -- (denies)  Homicidal Thoughts:Homicidal Thoughts: No   Sensorium  Memory: Immediate Good; Recent Good; Remote Good  Judgment: Fair  Insight: Fair   Art therapist  Concentration: Fair  Attention Span: Fair  Recall: Good  Fund of Knowledge: Good  Language: Good   Psychomotor Activity  Psychomotor Activity:Psychomotor Activity: Normal   Assets  Assets: Communication Skills; Desire for Improvement; Resilience   Sleep  Sleep:Sleep: Fair Number of Hours of Sleep: 6    Physical Exam: Physical Exam Vitals and nursing note reviewed.  Constitutional:      Appearance: Normal appearance.  HENT:     Head: Normocephalic and atraumatic.     Nose: Nose normal.  Pulmonary:     Effort: Pulmonary effort is normal.  Musculoskeletal:        General: Normal range of motion.     Cervical back: Normal range of motion.  Neurological:     General: No focal deficit present.     Mental Status: He is alert. Mental status is at baseline.  Psychiatric:        Attention and Perception: Attention and perception normal.        Mood and Affect: Mood is anxious and depressed.         Speech: Speech normal.        Behavior: Behavior normal. Behavior is cooperative.        Thought Content: Thought content normal.        Cognition and Memory: Cognition and memory normal.        Judgment: Judgment is impulsive.    Review of Systems  Psychiatric/Behavioral:  Positive for depression. The patient is nervous/anxious and has insomnia.   All other systems reviewed and are negative.  Blood pressure 116/63, pulse 72, temperature 98.1 F (36.7 C), resp. rate 20, height 5\' 7"  (1.702 m), weight 98.4 kg, SpO2 99%. Body mass index is 33.99 kg/m.   Treatment Plan Summary: Daily contact with patient to assess and evaluate symptoms and progress in treatment and Medication management Lithium 300 mg BID for mood stabilization and depressive symptoms. Invega 6 mg daily for schizoaffective disorder, with plans to transition to a long-acting injectable (LAI) formulation. Initiate an Invega taper in preparation for LAI administration. Hydroxyzine 50 mg TID PRN for anxiety management. Trazodone to 150 mg HS for sleep disturbances and depressive symptoms. Support transition to sober living and provide necessary referrals. Encourage continued engagement in therapy and support groups for sustained recovery. Monitor for mood stability, medication adherence, and side effects, ensuring appropriate transition to LAI. Monitor the patient every 15 minutes for safety and behavioral stability. Begin tapering oral Invega 6 mg daily to transition to [place] LAI as follows: Decrease oral Invega to 3 mg daily for 3 days while preparing for LAI initiation Administer Invega Sustenna 234 mg IM on Day 1. Administer Invega Sustenna 156 mg IM on Day 8. Discontinue oral Invega after the second injection Continue monthly maintenance with Invega Sustenna 117-156 mg IM every 4 weeks, based on response. [name], NP 02/03/2024 11:48 AM,

## 2024-02-04 NOTE — Group Note (Signed)
 Date:  02/04/2024 Time:  10:04 AM  Group Topic/Focus:   Goals Group:   The focus of this group is to help patients establish daily goals to achieve during treatment and discuss how the patient can incorporate goal setting into their daily lives to aide in recovery.  Overcoming Stress:   The focus of this group is to define stress and help patients assess their triggers.   Participation Level:  Did Not Attend   Lavonne Kinderman A Orhan Mayorga 02/04/2024, 10:04 AM

## 2024-02-04 NOTE — Group Note (Signed)
 Date:  02/04/2024 Time:  4:44 PM  Group Topic/Focus:  Wellness Toolbox:   The focus of this group is to discuss various aspects of wellness, balancing those aspects and exploring ways to increase the ability to experience wellness.  Patients will create a wellness toolbox for use upon discharge.    Participation Level:  Active  Participation Quality:  Appropriate  Affect:  Appropriate  Cognitive:  Appropriate  Insight: Appropriate  Engagement in Group:  Engaged  Modes of Intervention:  Activity  Additional Comments:    Wilford Corner 02/04/2024, 4:44 PM

## 2024-02-04 NOTE — Group Note (Signed)
 Date:  02/04/2024 Time:  8:42 PM  Group Topic/Focus:  Wrap-Up Group:   The focus of this group is to help patients review their daily goal of treatment and discuss progress on daily workbooks.    Participation Level:  Active  Participation Quality:  Appropriate and Attentive  Affect:  Appropriate  Cognitive:  Alert and Appropriate  Insight: Appropriate  Engagement in Group:  Engaged  Modes of Intervention:  Discussion, Education, and Orientation  Additional Comments:     [name],Johanna Stafford E 02/04/2024, 8:42 PM

## 2024-02-04 NOTE — Progress Notes (Signed)
 [facility] MD Progress Note  02/04/2024 2:57 PM Jeremiah Newman  MRN:  [id number] Subjective:  33 year old Caucasian male presents today reporting difficulty sleeping, stating, "I can't sleep. The Trazodone is not helping. Can I be put on Quetiapine?" The patient appears engaged in treatment and continues to express a commitment to his recovery and transition to sober living Principal Problem: Schizoaffective disorder, bipolar type (HCC) Diagnosis: Principal Problem:   Schizoaffective disorder, bipolar type (HCC)  Total Time spent with patient: 1.5 hours  Past Psychiatric History: see below  Past Medical History:  Past Medical History:  Diagnosis Date   Schizophrenia (HCC)    History reviewed. No pertinent surgical history. Family History: History reviewed. No pertinent family history. Family Psychiatric  History:Mother Depression Social History:  Social History   Substance and Sexual Activity  Alcohol Use Yes     Social History   Substance and Sexual Activity  Drug Use No    Social History   Socioeconomic History   Marital status: Single    Spouse name: Not on file   Number of children: Not on file   Years of education: Not on file   Highest education level: Not on file  Occupational History   Not on file  Tobacco Use   Smoking status: Some Days    Current packs/day: 1.00    Average packs/day: 1 pack/day for 9.2 years (9.2 ttl pk-yrs)    Types: Cigarettes    Start date: 2016   Smokeless tobacco: Never  Vaping Use   Vaping status: Every Day  Substance and Sexual Activity   Alcohol use: Yes   Drug use: No   Sexual activity: Not Currently  Other Topics Concern   Not on file  Social History Narrative   Not on file   Social Drivers of Health   Financial Resource Strain: Not on file  Food Insecurity: No Food Insecurity (01/30/2024)   Hunger Vital Sign    Worried About Running Out of Food in the Last Year: Never true    Ran Out of Food in the Last Year: Never true   Recent Concern: Food Insecurity - Food Insecurity Present (12/18/2023)   Hunger Vital Sign    Worried About Running Out of Food in the Last Year: Often true    Ran Out of Food in the Last Year: Often true  Transportation Needs: No Transportation Needs (01/30/2024)   PRAPARE - Administrator, Civil Service (Medical): No    Lack of Transportation (Non-Medical): No  Recent Concern: Transportation Needs - Unmet Transportation Needs (12/18/2023)   PRAPARE - Administrator, Civil Service (Medical): Yes    Lack of Transportation (Non-Medical): Yes  Physical Activity: Not on file  Stress: Not on file  Social Connections: Unknown (01/30/2024)   Social Connection and Isolation Panel [NHANES]    Frequency of Communication with Friends and Family: More than three times a week    Frequency of Social Gatherings with Friends and Family: More than three times a week    Attends Religious Services: Patient declined    Database administrator or Organizations: No    Attends Banker Meetings: Never    Marital Status: Never married   Additional Social History:                         Sleep: Poor  Appetite:  Good  Current Medications: Current Facility-Administered Medications  Medication Dose Route Frequency Provider  Last Rate Last Admin   acetaminophen (TYLENOL) tablet 650 mg  650 mg Oral Q6H PRN Tingling, Stephanie, PA-C       alum & mag hydroxide-simeth (MAALOX/MYLANTA) 200-200-20 MG/5ML suspension 30 mL  30 mL Oral Q4H PRN Tingling, Stephanie, PA-C       haloperidol (HALDOL) tablet 5 mg  5 mg Oral TID PRN Tingling, Stephanie, PA-C       And   diphenhydrAMINE (BENADRYL) capsule 50 mg  50 mg Oral TID PRN Tingling, Stephanie, PA-C       haloperidol lactate (HALDOL) injection 5 mg  5 mg Intramuscular TID PRN Tingling, Stephanie, PA-C       And   diphenhydrAMINE (BENADRYL) injection 50 mg  50 mg Intramuscular TID PRN Tingling, Stephanie, PA-C       And    LORazepam (ATIVAN) injection 2 mg  2 mg Intramuscular TID PRN Tingling, Stephanie, PA-C       haloperidol lactate (HALDOL) injection 10 mg  10 mg Intramuscular TID PRN Tingling, Stephanie, PA-C       And   diphenhydrAMINE (BENADRYL) injection 50 mg  50 mg Intramuscular TID PRN Tingling, Stephanie, PA-C       And   LORazepam (ATIVAN) injection 2 mg  2 mg Intramuscular TID PRN Tingling, Stephanie, PA-C       hydrOXYzine (ATARAX) tablet 50 mg  50 mg Oral TID PRN Tingling, Stephanie, PA-C   50 mg at 01/31/24 2155   lithium carbonate (LITHOBID) ER tablet 300 mg  300 mg Oral Q12H Tingling, Stephanie, PA-C   300 mg at 02/04/24 8119   magnesium hydroxide (MILK OF MAGNESIA) suspension 30 mL  30 mL Oral Daily PRN Tingling, Stephanie, PA-C       melatonin tablet 10 mg  10 mg Oral QHS [name], NP       nicotine polacrilex (NICORETTE) gum 4 mg  4 mg Oral PRN [name], MD   4 mg at 02/04/24 1235   paliperidone (INVEGA) 24 hr tablet 6 mg  6 mg Oral Daily Tingling, Stephanie, PA-C   6 mg at 02/04/24 1478   traZODone (DESYREL) tablet 150 mg  150 mg Oral QHS Tingling, Stephanie, PA-C   150 mg at 02/03/24 2112    Lab Results: No results found for this or any previous visit (from the past 48 hours).  Blood Alcohol level:  Lab Results  Component Value Date   ETH <10 01/30/2024   ETH <10 12/18/2023    Metabolic Disorder Labs: Lab Results  Component Value Date   HGBA1C 4.7 (L) 12/23/2023   MPG 88.19 12/23/2023   MPG 93.93 10/10/2023   No results found for: "PROLACTIN" Lab Results  Component Value Date   CHOL 215 (H) 12/23/2023   TRIG 297 (H) 12/23/2023   HDL 40 (L) 12/23/2023   CHOLHDL 5.4 12/23/2023   VLDL 59 (H) 12/23/2023   LDLCALC 116 (H) 12/23/2023   LDLCALC 146 (H) 10/10/2023    Physical Findings: AIMS:  , ,  ,  ,    CIWA:    COWS:     Musculoskeletal: Strength & Muscle Tone: within normal limits Gait & Station: normal Patient leans: N/A  Psychiatric Specialty  Exam:  Presentation  General Appearance:  Fairly Groomed  Eye Contact: Good  Speech: Clear and Coherent; Normal Rate  Speech Volume: Normal  Handedness: Right   Mood and Affect  Mood: Anxious  Affect: Flat   Thought Process  Thought Processes: Goal Directed; Coherent  Descriptions of Associations:Intact  Orientation:Full (Time, Place and Person)  Thought Content:Logical; WDL  History of Schizophrenia/Schizoaffective disorder:Yes  Duration of Psychotic Symptoms:Greater than six months  Hallucinations:Hallucinations: None Description of Auditory Hallucinations: denies Description of Visual Hallucinations: denies  Ideas of Reference:None  Suicidal Thoughts:Suicidal Thoughts: No SI Active Intent and/or Plan: -- (denies) SI Passive Intent and/or Plan: -- (denies)  Homicidal Thoughts:Homicidal Thoughts: No   Sensorium  Memory: Immediate Good; Recent Good; Remote Good  Judgment: Fair  Insight: Fair   Art therapist  Concentration: Fair  Attention Span: Fair  Recall: Good  Fund of Knowledge: Good  Language: Good   Psychomotor Activity  Psychomotor Activity: Psychomotor Activity: Normal   Assets  Assets: Communication Skills; Desire for Improvement; Resilience   Sleep  Sleep: Sleep: Fair Number of Hours of Sleep: 6    Physical Exam: Physical Exam Vitals and nursing note reviewed.  Constitutional:      Appearance: Normal appearance.  HENT:     Head: Normocephalic and atraumatic.     Nose: Nose normal.  Pulmonary:     Effort: Pulmonary effort is normal.  Musculoskeletal:        General: Normal range of motion.     Cervical back: Normal range of motion.  Neurological:     General: No focal deficit present.     Mental Status: He is alert and oriented to person, place, and time. Mental status is at baseline.  Psychiatric:        Attention and Perception: Attention and perception normal.        Mood and Affect:  Mood is anxious and depressed. Affect is flat.        Speech: Speech normal.        Behavior: Behavior normal. Behavior is cooperative.        Thought Content: Thought content normal.        Cognition and Memory: Cognition and memory normal.        Judgment: Judgment is impulsive.    Review of Systems  Psychiatric/Behavioral:  Positive for depression. The patient is nervous/anxious and has insomnia.   All other systems reviewed and are negative.  Blood pressure 116/63, pulse 72, temperature 98.1 F (36.7 C), resp. rate 20, height 5\' 7"  (1.702 m), weight 98.4 kg, SpO2 99%. Body mass index is 33.99 kg/m.   Treatment Plan Summary: Monitor the patient every 15 minutes for safety and behavioral stability. Assess sleep disturbances and review medication history. Increase Trazodone 2000 mg HS due to inefficacy. Initiate Remeron 7.5 mg HS for sleep, with potential titration based on response. Continue Invega taper and LAI transition: Decrease oral Invega to 3 mg daily for 3 days while preparing for LAI initiation. Administer Invega Sustenna 234 mg IM on Day 1. Administer Invega Sustenna 156 mg IM on Day 8. Discontinue oral Invega after the second injection. Continue monthly maintenance with Invega Sustenna 117-156 mg IM every 4 weeks, based on response. Continue Lithium 300 mg BID for mood stabilization and depressive symptoms. Continue Hydroxyzine 50 mg TID PRN for anxiety management. Support transition to sober living and provide necessary referrals. Encourage continued engagement in therapy and support groups for sustained recovery. Assess sleep disturbances and review medication history. Quetiapine (Seroquel) will not be initiated due to the patient already being on two antipsychotics (Invega and Lithium).  [name], NP 02/04/2024, 2:57 PM

## 2024-02-04 NOTE — [facility] Counselor (Signed)
 CSW provided patient with requested resources for housing and inpatient treatment facilities. Patient encouraged to call to assess for placement.   CSW team to continue to assess.     Reymundo Poll, MSW, LCSWA 02/04/2024 9:37 AM

## 2024-02-04 NOTE — Progress Notes (Signed)
 Pleasant and cooperative. Appropriate with staff and peers. Visible in milieu. Denies SI, HI, AVH.  No complaints or concerns voiced. Encouragement and support provided. Safety checks maintained,  medications given as prescribed. Pt receptive and remains safe on unit with q 15 min checks.

## 2024-02-05 DIAGNOSIS — F250 Schizoaffective disorder, bipolar type: Secondary | ICD-10-CM | POA: Diagnosis not present

## 2024-02-05 LAB — LITHIUM LEVEL: Lithium Lvl: 0.32 mmol/L — ABNORMAL LOW (ref 0.60–1.20)

## 2024-02-05 NOTE — Group Note (Signed)
 Date:  02/05/2024 Time:  9:35 PM  Group Topic/Focus:  Healthy Communication:   The focus of this group is to discuss communication, barriers to communication, as well as healthy ways to communicate with others.    Participation Level:  Active  Participation Quality:  Appropriate  Affect:  Appropriate  Cognitive:  Appropriate  Insight: Appropriate  Engagement in Group:  Engaged  Modes of Intervention:  Activity  Additional Comments:    Channah Godeaux 02/05/2024, 9:35 PM

## 2024-02-05 NOTE — Plan of Care (Signed)
   Problem: Education: Goal: Emotional status will improve Outcome: Progressing Goal: Mental status will improve Outcome: Progressing   Problem: Safety: Goal: Periods of time without injury will increase Outcome: Progressing

## 2024-02-05 NOTE — BH IP Treatment Plan (Signed)
 Interdisciplinary Treatment and Diagnostic Plan Update  02/05/2024 Time of Session: 11:00 Jeremiah Newman MRN: [id number]  Principal Diagnosis: Schizoaffective disorder, bipolar type [facility])  Secondary Diagnoses: Principal Problem:   Schizoaffective disorder, bipolar type (HCC)   Current Medications:  Current Facility-Administered Medications  Medication Dose Route Frequency Provider Last Rate Last Admin   acetaminophen (TYLENOL) tablet 650 mg  650 mg Oral Q6H PRN Tingling, Stephanie, PA-C       alum & mag hydroxide-simeth (MAALOX/MYLANTA) 200-200-20 MG/5ML suspension 30 mL  30 mL Oral Q4H PRN Tingling, Stephanie, PA-C       haloperidol (HALDOL) tablet 5 mg  5 mg Oral TID PRN Tingling, Stephanie, PA-C       And   diphenhydrAMINE (BENADRYL) capsule 50 mg  50 mg Oral TID PRN Tingling, Stephanie, PA-C       haloperidol lactate (HALDOL) injection 5 mg  5 mg Intramuscular TID PRN Tingling, Stephanie, PA-C       And   diphenhydrAMINE (BENADRYL) injection 50 mg  50 mg Intramuscular TID PRN Tingling, Stephanie, PA-C       And   LORazepam (ATIVAN) injection 2 mg  2 mg Intramuscular TID PRN Tingling, Stephanie, PA-C       haloperidol lactate (HALDOL) injection 10 mg  10 mg Intramuscular TID PRN Tingling, Stephanie, PA-C       And   diphenhydrAMINE (BENADRYL) injection 50 mg  50 mg Intramuscular TID PRN Tingling, Stephanie, PA-C       And   LORazepam (ATIVAN) injection 2 mg  2 mg Intramuscular TID PRN Tingling, Stephanie, PA-C       hydrOXYzine (ATARAX) tablet 50 mg  50 mg Oral TID PRN Tingling, Stephanie, PA-C   50 mg at 01/31/24 2155   lithium carbonate (LITHOBID) ER tablet 300 mg  300 mg Oral Q12H Tingling, Stephanie, PA-C   300 mg at 02/05/24 0859   magnesium hydroxide (MILK OF MAGNESIA) suspension 30 mL  30 mL Oral Daily PRN Tingling, Stephanie, PA-C       melatonin tablet 10 mg  10 mg Oral QHS [name], NP   10 mg at 02/04/24 2106   mirtazapine (REMERON) tablet 7.5 mg  7.5 mg Oral QHS  [name], NP   7.5 mg at 02/04/24 2105   nicotine polacrilex (NICORETTE) gum 4 mg  4 mg Oral PRN [name], MD   4 mg at 02/05/24 1406   paliperidone (INVEGA) 24 hr tablet 3 mg  3 mg Oral Daily [name], NP   3 mg at 02/05/24 0859   traZODone (DESYREL) tablet 200 mg  200 mg Oral QHS PRN [name], NP   200 mg at 02/04/24 2104   PTA Medications: Medications Prior to Admission  Medication Sig Dispense Refill Last Dose/Taking   gabapentin (NEURONTIN) 300 MG capsule Take 1 capsule (300 mg total) by mouth 3 (three) times daily. 90 capsule 0    hydrOXYzine (ATARAX) 50 MG tablet Take 1 tablet (50 mg total) by mouth 3 (three) times daily as needed for anxiety. 30 tablet 0    lithium carbonate 300 MG capsule Take 2 capsules (600 mg total) by mouth daily with supper AND 1 capsule (300 mg total) daily with breakfast. 90 capsule 0    melatonin 5 MG TABS Take 1 tablet (5 mg total) by mouth at bedtime. (Patient not taking: Reported on 01/30/2024) 30 tablet 0    nicotine (NICODERM CQ - DOSED IN MG/24 HOURS) 21 mg/24hr patch  Place 1 patch (21 mg total) onto the skin daily. 28 patch 0    nicotine polacrilex (NICORETTE) 2 MG gum Take 1 each (2 mg total) by mouth as needed for smoking cessation. 100 tablet 0    OLANZapine (ZYPREXA) 15 MG tablet Take 1 tablet (15 mg total) by mouth 2 (two) times daily. 60 tablet 0    pantoprazole (PROTONIX) 40 MG tablet Take 1 tablet (40 mg total) by mouth daily. 30 tablet 0     Patient Stressors:    Patient Strengths:    Treatment Modalities: Medication Management, Group therapy, Case management,  1 to 1 session with clinician, Psychoeducation, Recreational therapy.   Physician Treatment Plan for Primary Diagnosis: Schizoaffective disorder, bipolar type (HCC) Long Term Goal(s):     Short Term Goals:    Medication Management: Evaluate patient's response, side effects, and tolerance of medication regimen.  Therapeutic Interventions: 1 to 1 sessions,  Unit Group sessions and Medication administration.  Evaluation of Outcomes: Progressing  Physician Treatment Plan for Secondary Diagnosis: Principal Problem:   Schizoaffective disorder, bipolar type (HCC)  Long Term Goal(s):     Short Term Goals:       Medication Management: Evaluate patient's response, side effects, and tolerance of medication regimen.  Therapeutic Interventions: 1 to 1 sessions, Unit Group sessions and Medication administration.  Evaluation of Outcomes: Progressing   RN Treatment Plan for Primary Diagnosis: Schizoaffective disorder, bipolar type (HCC) Long Term Goal(s): Knowledge of disease and therapeutic regimen to maintain health will improve  Short Term Goals: Ability to remain free from injury will improve, Ability to verbalize frustration and anger appropriately will improve, Ability to demonstrate self-control, Ability to participate in decision making will improve, Ability to verbalize feelings will improve, Ability to disclose and discuss suicidal ideas, Ability to identify and develop effective coping behaviors will improve, and Compliance with prescribed medications will improve  Medication Management: RN will administer medications as ordered by provider, will assess and evaluate patient's response and provide education to patient for prescribed medication. RN will report any adverse and/or side effects to prescribing provider.  Therapeutic Interventions: 1 on 1 counseling sessions, Psychoeducation, Medication administration, Evaluate responses to treatment, Monitor vital signs and CBGs as ordered, Perform/monitor CIWA, COWS, AIMS and Fall Risk screenings as ordered, Perform wound care treatments as ordered.  Evaluation of Outcomes: Progressing   LCSW Treatment Plan for Primary Diagnosis: Schizoaffective disorder, bipolar type (HCC) Long Term Goal(s): Safe transition to appropriate next level of care at discharge, Engage patient in therapeutic group  addressing interpersonal concerns.  Short Term Goals: Engage patient in aftercare planning with referrals and resources, Increase social support, Increase ability to appropriately verbalize feelings, Increase emotional regulation, Facilitate acceptance of mental health diagnosis and concerns, Facilitate patient progression through stages of change regarding substance use diagnoses and concerns, Identify triggers associated with mental health/substance abuse issues, and Increase skills for wellness and recovery  Therapeutic Interventions: Assess for all discharge needs, 1 to 1 time with Social worker, Explore available resources and support systems, Assess for adequacy in community support network, Educate family and significant other(s) on suicide prevention, Complete Psychosocial Assessment, Interpersonal group therapy.  Evaluation of Outcomes: Progressing   Progress in Treatment: Attending groups: Yes. Participating in groups: Yes. Taking medication as prescribed: Yes. Toleration medication: Yes. Family/Significant other contact made: Yes, individual(s) contacted:  father, Quinntin Malter Sr.  Patient understands diagnosis: Yes. Discussing patient identified problems/goals with staff: Yes. Medical problems stabilized or resolved: Yes. Denies suicidal/homicidal ideation: Yes. Issues/concerns  per patient self-inventory: Yes. Other: none.   New problem(s) identified: No, Describe:  None. Update 02/05/24: No changes at this time.     New Short Term/Long Term Goal(s):detox, elimination of symptoms of psychosis, medication management for mood stabilization; elimination of SI thoughts; development of comprehensive mental wellness/sobriety plan. Update 02/05/24: No changes at this time.    Patient Goals:  "Long term treatment." Update 02/05/24: No changes at this time.   Discharge Plan or Barriers: CSW to assist in the development of appropriate discharge plan. Update 02/05/24: Pt reported that [facility]  continues to decline him. He has been given application for [facility] per request.    Reason for Continuation of Hospitalization: Anxiety Depression Hallucinations Medication stabilization Suicidal ideation Withdrawal symptoms   Estimated Length of Stay: 1-7 days. Update 02/05/24: No changes at this time.  Last 3 [place] Suicide Severity Risk Score: Flowsheet Row Admission (Current) from 01/30/2024 in [facility] INPATIENT BEHAVIORAL MEDICINE Most recent reading at 01/30/2024  9:17 PM ED from 01/30/2024 in [facility] at [facility] Most recent reading at 01/30/2024  1:28 PM Admission (Discharged) from 12/18/2023 in [facility] INPATIENT BEHAVIORAL MEDICINE Most recent reading at 12/18/2023  4:32 PM  C-SSRS RISK CATEGORY Error: Q7 should not be populated when Q6 is No High Risk High Risk       Last PHQ 2/9 Scores:     No data to display          Scribe for Treatment Team: Glenis Smoker, LCSW 02/05/2024 3:28 PM

## 2024-02-05 NOTE — Progress Notes (Signed)
 [facility] MD Progress Note  02/05/2024 8:14 PM Jeremiah Newman  MRN:  [id number] Subjective:  33 year old Caucasian male, reports "sleeping is better" and is currently undergoing an Invega taper in preparation for long-acting injectable (LAI) transition for schizophrenia management. Principal Problem: Schizoaffective disorder, bipolar type (HCC) Diagnosis: Principal Problem:   Schizoaffective disorder, bipolar type (HCC)  Total Time spent with patient: 1 hour  Past Psychiatric History: see below  Past Medical History:  Past Medical History:  Diagnosis Date   Schizophrenia (HCC)    History reviewed. No pertinent surgical history. Family History: History reviewed. No pertinent family history. Family Psychiatric  History:Mother depression Social History:  Social History   Substance and Sexual Activity  Alcohol Use Yes     Social History   Substance and Sexual Activity  Drug Use No    Social History   Socioeconomic History   Marital status: Single    Spouse name: Not on file   Number of children: Not on file   Years of education: Not on file   Highest education level: Not on file  Occupational History   Not on file  Tobacco Use   Smoking status: Some Days    Current packs/day: 1.00    Average packs/day: 1 pack/day for 9.2 years (9.2 ttl pk-yrs)    Types: Cigarettes    Start date: 2016   Smokeless tobacco: Never  Vaping Use   Vaping status: Every Day  Substance and Sexual Activity   Alcohol use: Yes   Drug use: No   Sexual activity: Not Currently  Other Topics Concern   Not on file  Social History Narrative   Not on file   Social Drivers of Health   Financial Resource Strain: Not on file  Food Insecurity: No Food Insecurity (01/30/2024)   Hunger Vital Sign    Worried About Running Out of Food in the Last Year: Never true    Ran Out of Food in the Last Year: Never true  Recent Concern: Food Insecurity - Food Insecurity Present (12/18/2023)   Hunger Vital Sign     Worried About Running Out of Food in the Last Year: Often true    Ran Out of Food in the Last Year: Often true  Transportation Needs: No Transportation Needs (01/30/2024)   PRAPARE - Administrator, Civil Service (Medical): No    Lack of Transportation (Non-Medical): No  Recent Concern: Transportation Needs - Unmet Transportation Needs (12/18/2023)   PRAPARE - Administrator, Civil Service (Medical): Yes    Lack of Transportation (Non-Medical): Yes  Physical Activity: Not on file  Stress: Not on file  Social Connections: Unknown (01/30/2024)   Social Connection and Isolation Panel [NHANES]    Frequency of Communication with Friends and Family: More than three times a week    Frequency of Social Gatherings with Friends and Family: More than three times a week    Attends Religious Services: Patient declined    Database administrator or Organizations: No    Attends Banker Meetings: Never    Marital Status: Never married   Additional Social History:                         Sleep: Good  Appetite:  Good  Current Medications: Current Facility-Administered Medications  Medication Dose Route Frequency Provider Last Rate Last Admin   acetaminophen (TYLENOL) tablet 650 mg  650 mg Oral Q6H PRN Tingling, Judeth Cornfield,  PA-C       alum & mag hydroxide-simeth (MAALOX/MYLANTA) 200-200-20 MG/5ML suspension 30 mL  30 mL Oral Q4H PRN Tingling, Stephanie, PA-C       haloperidol (HALDOL) tablet 5 mg  5 mg Oral TID PRN Tingling, Stephanie, PA-C       And   diphenhydrAMINE (BENADRYL) capsule 50 mg  50 mg Oral TID PRN Tingling, Stephanie, PA-C       haloperidol lactate (HALDOL) injection 5 mg  5 mg Intramuscular TID PRN Tingling, Stephanie, PA-C       And   diphenhydrAMINE (BENADRYL) injection 50 mg  50 mg Intramuscular TID PRN Tingling, Stephanie, PA-C       And   LORazepam (ATIVAN) injection 2 mg  2 mg Intramuscular TID PRN Tingling, Stephanie, PA-C        haloperidol lactate (HALDOL) injection 10 mg  10 mg Intramuscular TID PRN Tingling, Stephanie, PA-C       And   diphenhydrAMINE (BENADRYL) injection 50 mg  50 mg Intramuscular TID PRN Tingling, Stephanie, PA-C       And   LORazepam (ATIVAN) injection 2 mg  2 mg Intramuscular TID PRN Tingling, Stephanie, PA-C       hydrOXYzine (ATARAX) tablet 50 mg  50 mg Oral TID PRN Tingling, Stephanie, PA-C   50 mg at 01/31/24 2155   lithium carbonate (LITHOBID) ER tablet 300 mg  300 mg Oral Q12H Tingling, Stephanie, PA-C   300 mg at 02/05/24 0859   magnesium hydroxide (MILK OF MAGNESIA) suspension 30 mL  30 mL Oral Daily PRN Tingling, Stephanie, PA-C       melatonin tablet 10 mg  10 mg Oral QHS [name], NP   10 mg at 02/04/24 2106   mirtazapine (REMERON) tablet 7.5 mg  7.5 mg Oral QHS [name], NP   7.5 mg at 02/04/24 2105   nicotine polacrilex (NICORETTE) gum 4 mg  4 mg Oral PRN [name], MD   4 mg at 02/05/24 1756   paliperidone (INVEGA) 24 hr tablet 3 mg  3 mg Oral Daily [name], NP   3 mg at 02/05/24 1610   traZODone (DESYREL) tablet 200 mg  200 mg Oral QHS PRN [name], NP   200 mg at 02/04/24 2104    Lab Results:  Results for orders placed or performed during the hospital encounter of 01/30/24 (from the past 48 hours)  Lithium level     Status: Abnormal   Collection Time: 02/05/24  6:25 AM  Result Value Ref Range   Lithium Lvl 0.32 (L) 0.60 - 1.20 mmol/L    Comment: Performed at [facility], [address] [place], [place] [address]    Blood Alcohol level:  Lab Results  Component Value Date   [facility] <10 01/30/2024   ETH <10 12/18/2023    Metabolic Disorder Labs: Lab Results  Component Value Date   HGBA1C 4.7 (L) 12/23/2023   MPG 88.19 12/23/2023   MPG 93.93 10/10/2023   No results found for: "PROLACTIN" Lab Results  Component Value Date   CHOL 215 (H) 12/23/2023   TRIG 297 (H) 12/23/2023   HDL 40 (L) 12/23/2023   CHOLHDL 5.4 12/23/2023   VLDL  59 (H) 12/23/2023   LDLCALC 116 (H) 12/23/2023   LDLCALC 146 (H) 10/10/2023    Physical Findings: AIMS:  , ,  ,  ,    CIWA:    COWS:     Musculoskeletal: Strength &  Muscle Tone: within normal limits Gait & Station: normal Patient leans: N/A  Psychiatric Specialty Exam:  Presentation  General Appearance:  Fairly Groomed  Eye Contact: Good  Speech: Clear and Coherent; Normal Rate  Speech Volume: Normal  Handedness: Right   Mood and Affect  Mood: Anxious  Affect: Flat   Thought Process  Thought Processes: Goal Directed; Coherent  Descriptions of Associations:Intact  Orientation:Full (Time, Place and Person)  Thought Content:Logical; WDL  History of Schizophrenia/Schizoaffective disorder:Yes  Duration of Psychotic Symptoms:Greater than six months  Hallucinations:No data recorded Ideas of Reference:None  Suicidal Thoughts:No data recorded Homicidal Thoughts:No data recorded  Sensorium  Memory: Immediate Good; Recent Good; Remote Good  Judgment: Fair  Insight: Fair   Art therapist  Concentration: Fair  Attention Span: Fair  Recall: Good  Fund of Knowledge: Good  Language: Good   Psychomotor Activity  Psychomotor Activity:No data recorded  Assets  Assets: Communication Skills; Desire for Improvement; Resilience   Sleep  Sleep:No data recorded   Physical Exam: Physical Exam Vitals and nursing note reviewed.  Constitutional:      Appearance: Normal appearance.  HENT:     Head: Normocephalic and atraumatic.     Nose: Nose normal.  Pulmonary:     Effort: Pulmonary effort is normal.  Musculoskeletal:        General: Normal range of motion.     Cervical back: Normal range of motion.  Neurological:     General: No focal deficit present.     Mental Status: He is alert. Mental status is at baseline.  Psychiatric:        Attention and Perception: Attention normal. He perceives auditory hallucinations.         Mood and Affect: Mood is anxious. Affect is flat.        Speech: Speech normal.        Behavior: Behavior normal. Behavior is cooperative.        Thought Content: Thought content includes suicidal ideation.        Cognition and Memory: Cognition and memory normal.        Judgment: Judgment normal.    Review of Systems  Psychiatric/Behavioral:  Positive for hallucinations. The patient has insomnia.   All other systems reviewed and are negative.  Blood pressure (!) 132/55, pulse 76, temperature (!) 97.4 F (36.3 C), resp. rate 18, height 5\' 7"  (1.702 m), weight 98.4 kg, SpO2 99%. Body mass index is 33.99 kg/m.   Treatment Plan Summary: Daily contact with patient to assess and evaluate symptoms and progress in treatment and Medication management onitor the patient every 15 minutes for safety and behavioral stability. Assess sleep disturbances and review medication history. Trazodone 200 mg HS due to inefficacy. Remeron 7.55 mg HS for sleep, with potential titration based on response. Continue Invega taper and LAI transition: Decrease oral Invega to 3 mg daily for 3 days while preparing for LAI initiation. Administer Invega Sustenna 234 mg IM on Day 1. Administer Invega Sustenna 156 mg IM on Day 8. Discontinue oral Invega after the second injection. Continue monthly maintenance with Invega Sustenna 117-156 mg IM every 4 weeks, based on response. Continue Lithium 300 mg BID for mood stabilization and depressive symptoms. Continue Hydroxyzine 50 mg TID PRN for anxiety management. Support transition to sober living and provide necessary referrals. Encourage continued engagement in therapy and support groups for sustained recovery. Assess sleep disturbances and review medication history. Quetiapine (Seroquel) will not be initiated due to the patient already being on  two antipsychotics Hinda Glatter and Lithium  [name], NP 02/05/2024, 8:14 PM

## 2024-02-05 NOTE — Plan of Care (Signed)
   Problem: Education: Goal: Knowledge of [facility] Education information/materials will improve Outcome: Progressing Goal: Emotional status will improve Outcome: Progressing Goal: Mental status will improve Outcome: Progressing

## 2024-02-05 NOTE — Group Note (Signed)
 Date:  02/05/2024 Time:  1:51 PM  Group Topic/Focus:  Movie Group  The purpose of this group is to allow patients to watch a relaxing educational movie about coping with adult life and trials ongoing in adulthood.     Participation Level:  Did Not Attend  Participation Quality:    Affect:    Cognitive:    Insight:   Engagement in Group:    Modes of Intervention:    Additional Comments:  Did not attend   Jesselyn Rask T Banks Chaikin 02/05/2024, 1:51 PM

## 2024-02-05 NOTE — Progress Notes (Addendum)
   02/05/24 1515  Spiritual Encounters  Type of Visit Follow up;Initial  Care provided to: Patient  Conversation partners present during encounter Nurse  Reason for visit Routine spiritual support  OnCall Visit No   Nurse suggested this patient would benefit from a visit but when the Chaplain went by the patient's room, he wasn't there.  Rev. Rana M. Earlene Plater, MDiv Chaplain Resident [facility]

## 2024-02-05 NOTE — Plan of Care (Signed)
  Problem: Education: Goal: Knowledge of [facility] Education information/materials will improve Outcome: Progressing Goal: Emotional status will improve 02/05/2024 1344 by Burnice Logan, RN Outcome: Progressing 02/05/2024 1332 by Burnice Logan, RN Outcome: Progressing Goal: Mental status will improve 02/05/2024 1344 by Burnice Logan, RN Outcome: Progressing 02/05/2024 1332 by Burnice Logan, RN Outcome: Progressing Goal: Verbalization of understanding the information provided will improve Outcome: Progressing

## 2024-02-05 NOTE — BHH Counselor (Signed)
 Requested documentation sent to [facility] on patient's behalf.     Reymundo Poll, MSW, LCSWA 02/05/2024 10:14 AM

## 2024-02-05 NOTE — BHH Counselor (Signed)
 CSW reached out to [facility] program to quick on status of referral as pt stated that he has been told he has to complete a 30 day program as they felt his medication was not helpful. CSW left contact information for follow through.   Pt received copy of application for [facility] for completion. He was also informed that [facility] stated that he cannot return to their program for 21 days as he had just completed their program.   CSW will continue to follow.   Vilma Meckel. Algis Greenhouse, MSW, LCSW, LCAS 02/05/2024 3:34 PM

## 2024-02-05 NOTE — Progress Notes (Signed)
   02/05/24 0900  Psych Admission Type (Psych Patients Only)  Admission Status Voluntary  Psychosocial Assessment  Patient Complaints Depression  Eye Contact Avoids;Poor  Facial Expression Flat  Affect Depressed  Speech Soft;Slow  Interaction Minimal  Motor Activity Other (Comment) (WNL)  Appearance/Hygiene Unremarkable  Behavior Characteristics Cooperative;Appropriate to situation  Mood Depressed  Thought Process  Coherency WDL  Content WDL  Delusions None reported or observed  Perception WDL  Hallucination None reported or observed  Judgment Impaired  Confusion WDL  Danger to Self  Current suicidal ideation? Denies  Self-Injurious Behavior No self-injurious ideation or behavior indicators observed or expressed   Agreement Not to Harm Self Yes  Description of Agreement Verbal  Danger to Others  Danger to Others None reported or observed
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
## 2024-02-06 DIAGNOSIS — F250 Schizoaffective disorder, bipolar type: Secondary | ICD-10-CM | POA: Diagnosis not present

## 2024-02-06 NOTE — Group Note (Signed)
 Recreation Therapy Group Note   Group Topic:Healthy Support Systems  Group Date: 02/06/2024 Start Time: 1000 End Time: 1050 Facilitators: Rosina Lowenstein, LRT, CTRS Location:  Craft Room  Group Description: Straw Bridge. Individually, patients were given 10 plastic drinking straws and an equal length of masking tape. Using the materials provided, patients were instructed to build a free-standing bridge-like structure to suspend an everyday item (ex: puzzle box) off the floor or table surface. All materials were required to be used in Secondary school teacher. LRT facilitated post-activity discussion reviewing how we, humans, are like the structure we built; when things get too heavy in our life and we do not have adequate supports/coping skills, then we will fall just like the straw-built structure will. LRT focused on how having a "base" or structure on the bottom was necessary for the object to stand, meaning we must be secure and stable first before building on ourselves or others. Patients were encouraged to name 2 healthy supports in their life and reflect on how the skills used in this activity can be generalized to daily life post discharge.   Goal Area(s) Addressed:    Patient will identify two healthy support systems in their life.   Patient will work on Product manager.   Patient will verbalize the importance of having a strong and steady "base".    Patient will follow multi-step directions.   Patients will engage in creativity and use all provided materials.    Affect/Mood: Appropriate   Participation Level: Active and Engaged   Participation Quality: Independent   Behavior: Appropriate, Calm, and Cooperative   Speech/Thought Process: Coherent   Insight: Good   Judgement: Good   Modes of Intervention: Guided Discussion, Problem-solving, Support, and STEM Activity   Patient Response to Interventions:  Attentive, Engaged, Interested , and Receptive   Education  Outcome:  Acknowledges education   Clinical Observations/Individualized Feedback: Jeremiah Newman was active in their participation of session activities and group discussion. Pt identified "God, the Lord, my family and playing my instruments" as his healthy supports. Pt successfully completed a straw structure. Pt interacted well with LRT and peers duration of session.    Plan: Continue to engage patient in RT group sessions 2-3x/week.   Rosina Lowenstein, LRT, CTRS 02/06/2024 12:14 PM

## 2024-02-06 NOTE — BHH Counselor (Signed)
 Pt received an application for [facility], per request. No other concerns expressed. Contact ended without incident.   Vilma Meckel. Algis Greenhouse, MSW, LCSW, LCAS 02/06/2024 10:49 AM

## 2024-02-06 NOTE — Group Note (Signed)
 Date:  02/06/2024 Time:  10:05 AM  Group Topic/Focus:  Crisis Planning:   The purpose of this group is to help patients create a crisis plan for use upon discharge or in the future, as needed.    Participation Level:  Active  Participation Quality:  Appropriate  Affect:  Appropriate  Cognitive:  Appropriate  Insight: Appropriate  Engagement in Group:  Engaged  Modes of Intervention:  Activity  Additional Comments:    Jeremiah Newman 02/06/2024, 10:05 AM

## 2024-02-06 NOTE — Group Note (Signed)
 Date:  02/06/2024 Time:  11:23 PM  Group Topic/Focus:  Wrap-Up Group:   The focus of this group is to help patients review their daily goal of treatment and discuss progress on daily workbooks.    Participation Level:  Active  Participation Quality:  Appropriate  Affect:  Appropriate  Cognitive:  Appropriate  Insight: Appropriate  Engagement in Group:  Engaged  Modes of Intervention:  Discussion   Lenore Cordia 02/06/2024, 11:23 PM

## 2024-02-06 NOTE — Progress Notes (Addendum)
 [facility] MD Progress Note  02/06/2024 7:33 PM Jeremiah Newman  MRN:  [id number] Subjective:   33 year old Caucasian male,was observed in the milieu actively participating in group activities. He engaged with staff and interacted appropriately with other patients. He remained calm, cooperative, and demonstrated social engagement throughout the session. No signs of distress or internal preoccupation were noted. Principal Problem: Schizoaffective disorder, bipolar type (HCC) Diagnosis: Principal Problem:   Schizoaffective disorder, bipolar type (HCC)  Total Time spent with patient: 1 hour  Past Psychiatric History: see below  Past Medical History:  Past Medical History:  Diagnosis Date   Schizophrenia (HCC)    History reviewed. No pertinent surgical history. Family History: History reviewed. No pertinent family history. Family Psychiatric  History: Mother Anxious Social History:  Social History   Substance and Sexual Activity  Alcohol Use Yes     Social History   Substance and Sexual Activity  Drug Use No    Social History   Socioeconomic History   Marital status: Single    Spouse name: Not on file   Number of children: Not on file   Years of education: Not on file   Highest education level: Not on file  Occupational History   Not on file  Tobacco Use   Smoking status: Some Days    Current packs/day: 1.00    Average packs/day: 1 pack/day for 9.2 years (9.2 ttl pk-yrs)    Types: Cigarettes    Start date: 2016   Smokeless tobacco: Never  Vaping Use   Vaping status: Every Day  Substance and Sexual Activity   Alcohol use: Yes   Drug use: No   Sexual activity: Not Currently  Other Topics Concern   Not on file  Social History Narrative   Not on file   Social Drivers of Health   Financial Resource Strain: Not on file  Food Insecurity: No Food Insecurity (01/30/2024)   Hunger Vital Sign    Worried About Running Out of Food in the Last Year: Never true    Ran Out of  Food in the Last Year: Never true  Recent Concern: Food Insecurity - Food Insecurity Present (12/18/2023)   Hunger Vital Sign    Worried About Running Out of Food in the Last Year: Often true    Ran Out of Food in the Last Year: Often true  Transportation Needs: No Transportation Needs (01/30/2024)   PRAPARE - Administrator, Civil Service (Medical): No    Lack of Transportation (Non-Medical): No  Recent Concern: Transportation Needs - Unmet Transportation Needs (12/18/2023)   PRAPARE - Administrator, Civil Service (Medical): Yes    Lack of Transportation (Non-Medical): Yes  Physical Activity: Not on file  Stress: Not on file  Social Connections: Unknown (01/30/2024)   Social Connection and Isolation Panel [NHANES]    Frequency of Communication with Friends and Family: More than three times a week    Frequency of Social Gatherings with Friends and Family: More than three times a week    Attends Religious Services: Patient declined    Database administrator or Organizations: No    Attends Banker Meetings: Never    Marital Status: Never married   Additional Social History:                         Sleep: Good  Appetite:  Good  Current Medications: Current Facility-Administered Medications  Medication Dose Route Frequency  Provider Last Rate Last Admin   acetaminophen (TYLENOL) tablet 650 mg  650 mg Oral Q6H PRN Tingling, Stephanie, PA-C       alum & mag hydroxide-simeth (MAALOX/MYLANTA) 200-200-20 MG/5ML suspension 30 mL  30 mL Oral Q4H PRN Tingling, Stephanie, PA-C       haloperidol (HALDOL) tablet 5 mg  5 mg Oral TID PRN Tingling, Stephanie, PA-C       And   diphenhydrAMINE (BENADRYL) capsule 50 mg  50 mg Oral TID PRN Tingling, Stephanie, PA-C       haloperidol lactate (HALDOL) injection 5 mg  5 mg Intramuscular TID PRN Tingling, Stephanie, PA-C       And   diphenhydrAMINE (BENADRYL) injection 50 mg  50 mg Intramuscular TID PRN  Tingling, Stephanie, PA-C       And   LORazepam (ATIVAN) injection 2 mg  2 mg Intramuscular TID PRN Tingling, Stephanie, PA-C       haloperidol lactate (HALDOL) injection 10 mg  10 mg Intramuscular TID PRN Tingling, Stephanie, PA-C       And   diphenhydrAMINE (BENADRYL) injection 50 mg  50 mg Intramuscular TID PRN Tingling, Stephanie, PA-C       And   LORazepam (ATIVAN) injection 2 mg  2 mg Intramuscular TID PRN Tingling, Stephanie, PA-C       hydrOXYzine (ATARAX) tablet 50 mg  50 mg Oral TID PRN Tingling, Stephanie, PA-C   50 mg at 01/31/24 2155   lithium carbonate (LITHOBID) ER tablet 300 mg  300 mg Oral Q12H Tingling, Stephanie, PA-C   300 mg at 02/06/24 0840   magnesium hydroxide (MILK OF MAGNESIA) suspension 30 mL  30 mL Oral Daily PRN Tingling, Stephanie, PA-C       melatonin tablet 10 mg  10 mg Oral QHS [name], NP   10 mg at 02/05/24 2107   mirtazapine (REMERON) tablet 7.5 mg  7.5 mg Oral QHS [name], NP   7.5 mg at 02/05/24 2110   nicotine polacrilex (NICORETTE) gum 4 mg  4 mg Oral PRN [name], MD   4 mg at 02/06/24 1736   paliperidone (INVEGA) 24 hr tablet 3 mg  3 mg Oral Daily [name], NP   3 mg at 02/06/24 0840   traZODone (DESYREL) tablet 200 mg  200 mg Oral QHS PRN [name], NP   200 mg at 02/04/24 2104    Lab Results:  Results for orders placed or performed during the hospital encounter of 01/30/24 (from the past 48 hours)  Lithium level     Status: Abnormal   Collection Time: 02/05/24  6:25 AM  Result Value Ref Range   Lithium Lvl 0.32 (L) 0.60 - 1.20 mmol/L    Comment: Performed at [facility], [address] [place], [place] [address]    Blood Alcohol level:  Lab Results  Component Value Date   [facility] <10 01/30/2024   ETH <10 12/18/2023    Metabolic Disorder Labs: Lab Results  Component Value Date   HGBA1C 4.7 (L) 12/23/2023   MPG 88.19 12/23/2023   MPG 93.93 10/10/2023   No results found for: "PROLACTIN" Lab Results   Component Value Date   CHOL 215 (H) 12/23/2023   TRIG 297 (H) 12/23/2023   HDL 40 (L) 12/23/2023   CHOLHDL 5.4 12/23/2023   VLDL 59 (H) 12/23/2023   LDLCALC 116 (H) 12/23/2023   LDLCALC 146 (H) 10/10/2023    Physical Findings: AIMS:  , ,  ,  ,  CIWA:    COWS:     Musculoskeletal: Strength & Muscle Tone: within normal limits Gait & Station: normal Patient leans: N/A  Psychiatric Specialty Exam:  Presentation  General Appearance:  Disheveled  Eye Contact: Good  Speech: Clear and Coherent; Normal Rate  Speech Volume: Normal  Handedness: Right   Mood and Affect  Mood: Anxious  Affect: Flat; Congruent   Thought Process  Thought Processes: Coherent  Descriptions of Associations:Intact  Orientation:Full (Time, Place and Person)  Thought Content:Rumination  History of Schizophrenia/Schizoaffective disorder:Yes  Duration of Psychotic Symptoms:Greater than six months  Hallucinations:Hallucinations: Auditory Description of Auditory Hallucinations: whispers Description of Visual Hallucinations: denies  Ideas of Reference:None  Suicidal Thoughts:Suicidal Thoughts: Yes, Passive SI Active Intent and/or Plan: Without Intent; Without Plan; Without Means to Carry Out SI Passive Intent and/or Plan: Without Intent; Without Plan; Without Means to Carry Out  Homicidal Thoughts:Homicidal Thoughts: No   Sensorium  Memory: Immediate Good; Recent Good; Remote Good  Judgment: Fair  Insight: Fair (understands treatment plan and medication adjustments)   Executive Functions  Concentration: Poor  Attention Span: Fair  Recall: Good  Fund of Knowledge: Good  Language: Good   Psychomotor Activity  Psychomotor Activity: Psychomotor Activity: Normal   Assets  Assets: Communication Skills; Desire for Improvement; Resilience   Sleep  Sleep: Sleep: Good Number of Hours of Sleep: 7    Physical Exam: Physical Exam Vitals and nursing  note reviewed.  Constitutional:      Appearance: Normal appearance.  HENT:     Head: Normocephalic and atraumatic.     Nose: Nose normal.  Pulmonary:     Effort: Pulmonary effort is normal.  Musculoskeletal:        General: Normal range of motion.     Cervical back: Normal range of motion.  Neurological:     General: No focal deficit present.     Mental Status: He is alert and oriented to person, place, and time. Mental status is at baseline.  Psychiatric:        Attention and Perception: Attention normal. He perceives auditory hallucinations.        Mood and Affect: Mood is anxious. Affect is flat.        Speech: Speech normal.        Behavior: Behavior normal. Behavior is cooperative.        Thought Content: Thought content normal.        Cognition and Memory: Cognition normal.        Judgment: Judgment is impulsive.    Review of Systems  Psychiatric/Behavioral:  Positive for depression and hallucinations.   All other systems reviewed and are negative.  Blood pressure 137/76, pulse 71, temperature 98.4 F (36.9 C), temperature source Oral, resp. rate 20, height 5\' 7"  (1.702 m), weight 98.4 kg, SpO2 99%. Body mass index is 33.99 kg/m.   Treatment Plan Summary: Daily contact with patient to assess and evaluate symptoms and progress in treatment and Medication management Monitor the patient every 15 minutes for safety and behavioral stability. Assess sleep disturbances and review medication history. Trazodone 200 mg HS due to inefficacy. Remeron 7.55 mg HS for sleep, with potential titration based on response. Continue Invega taper and LAI transition: Decrease oral Invega to 3 mg daily for 3 days while preparing for LAI initiation. Administer Invega Sustenna 234 mg IM on Day 1. Administer Invega Sustenna 156 mg IM on Day 8. Discontinue oral Invega after the second injection. Continue monthly maintenance with Gean Birchwood 117-156  mg IM every 4 weeks, based on  response. Continue Lithium 300 mg BID for mood stabilization and depressive symptoms. Continue Hydroxyzine 50 mg TID PRN for anxiety management. Support transition to sober living and provide necessary referrals. Encourage continued engagement in therapy and support groups for sustained recovery. Assess sleep disturbances and review medication history.  [name], NP 02/06/2024, 7:33 PM

## 2024-02-06 NOTE — Group Note (Unsigned)
 [facility] LCSW Group Therapy Note    Group Date: 02/06/2024 Start Time: 1300 End Time: 1400  Type of Therapy and Topic:  Group Therapy:  Overcoming Obstacles  Participation Level:  {[facility] PARTICIPATION LEVEL:22264:::1}  Mood:  Description of Group:   In this group patients will be encouraged to explore what they see as obstacles to their own wellness and recovery. They will be guided to discuss their thoughts, feelings, and behaviors related to these obstacles. The group will process together ways to cope with barriers, with attention given to specific choices patients can make. Each patient will be challenged to identify changes they are motivated to make in order to overcome their obstacles. This group will be process-oriented, with patients participating in exploration of their own experiences as well as giving and receiving support and challenge from other group members.  Therapeutic Goals: 1. Patient will identify personal and current obstacles as they relate to admission. 2. Patient will identify barriers that currently interfere with their wellness or overcoming obstacles.  3. Patient will identify feelings, thought process and behaviors related to these barriers. 4. Patient will identify two changes they are willing to make to overcome these obstacles:    Summary of Patient Progress   ***   Therapeutic Modalities:   Cognitive Behavioral Therapy Solution Focused Therapy Motivational Interviewing Relapse Prevention Therapy   Lowry Ram, LCSW

## 2024-02-06 NOTE — BHH Counselor (Signed)
 Patient given list of [employer]. Patient encouraged to call. Patient secured two phone interviews for Friday, 03/07.   CSW team to continue to assess.    Reymundo Poll, MSW, LCSWA 02/06/2024 3:45 PM

## 2024-02-06 NOTE — [facility] Counselor (Signed)
 CSW phone [facility] regarding referral. Contact was unable to be established but HIPAA compliant voicemail left with contact information for follow through.   Vilma Meckel. Algis Greenhouse, MSW, LCSW, LCAS 02/06/2024 2:35 PM

## 2024-02-06 NOTE — Progress Notes (Signed)
 Patient is a voluntary admission to [facility] with SAD/Bipolar.  Plan is for patient to discharge Saturday with patient given numbers for recovery services.  He is calm cooperative, and participates in group activities. Friendly with staff and peers. He states he has auditory hallucinations, but does not appear overtly internally stimulated.   Has only requested PRN nicorette gum.  Will continue to monitor.

## 2024-02-06 NOTE — Plan of Care (Signed)
  Problem: Education: Goal: Knowledge of  General Education information/materials will improve 02/06/2024 0438 by Neysa Bonito, RN Outcome: Progressing 02/06/2024 0438 by Neysa Bonito, RN Outcome: Progressing Goal: Emotional status will improve Outcome: Progressing

## 2024-02-06 NOTE — Progress Notes (Signed)
   02/06/24 0400  Psych Admission Type (Psych Patients Only)  Admission Status Voluntary  Psychosocial Assessment  Patient Complaints Depression  Eye Contact Fair  Facial Expression Flat  Affect Depressed  Speech Soft  Interaction Assertive  Motor Activity Slow  Appearance/Hygiene Unremarkable  Behavior Characteristics Cooperative;Appropriate to situation  Mood Depressed  Aggressive Behavior  Effect No apparent injury  Thought Process  Coherency WDL  Content WDL  Delusions None reported or observed  Perception WDL  Hallucination None reported or observed  Judgment WDL  Confusion WDL  Danger to Self  Current suicidal ideation? Denies  Self-Injurious Behavior No self-injurious ideation or behavior indicators observed or expressed   Danger to Others  Danger to Others None reported or observed     02/06/24 0400  Psych Admission Type (Psych Patients Only)  Admission Status Voluntary  Psychosocial Assessment  Patient Complaints Depression  Eye Contact Fair  Facial Expression Flat  Affect Depressed  Speech Soft  Interaction Assertive  Motor Activity Slow  Appearance/Hygiene Unremarkable  Behavior Characteristics Cooperative;Appropriate to situation  Mood Depressed  Aggressive Behavior  Effect No apparent injury  Thought Process  Coherency WDL  Content WDL  Delusions None reported or observed  Perception WDL  Hallucination None reported or observed  Judgment WDL  Confusion WDL  Danger to Self  Current suicidal ideation? Denies  Self-Injurious Behavior No self-injurious ideation or behavior indicators observed or expressed   Danger to Others  Danger to Others None reported or observed

## 2024-02-07 ENCOUNTER — Other Ambulatory Visit: Payer: Self-pay

## 2024-02-07 DIAGNOSIS — F250 Schizoaffective disorder, bipolar type: Secondary | ICD-10-CM | POA: Diagnosis not present

## 2024-02-07 MED ORDER — NICOTINE POLACRILEX 4 MG MT GUM
4.0000 mg | CHEWING_GUM | OROMUCOSAL | 0 refills | Status: AC | PRN
  Filled 2024-02-07: qty 110, fill #0

## 2024-02-07 MED ORDER — LITHIUM CARBONATE ER 300 MG PO TBCR: 300.0000 mg | EXTENDED_RELEASE_TABLET | Freq: Two times a day (BID) | ORAL | 0 refills | Status: DC

## 2024-02-07 MED ORDER — PALIPERIDONE ER 1.5 MG PO TB24: 1.5000 mg | ORAL_TABLET | Freq: Every day | ORAL | 0 refills | Status: DC

## 2024-02-07 MED ORDER — TRAZODONE HCL 100 MG PO TABS: 200.0000 mg | ORAL_TABLET | Freq: Every evening | ORAL | 0 refills | Status: DC | PRN

## 2024-02-07 MED ORDER — PALIPERIDONE PALMITATE ER 234 MG/1.5ML IM SUSY
234.0000 mg | PREFILLED_SYRINGE | Freq: Once | INTRAMUSCULAR | Status: AC
  Administered 2024-02-07: 234 mg via INTRAMUSCULAR
  Filled 2024-02-07: qty 1.5

## 2024-02-07 MED ORDER — MELATONIN 10 MG PO TABS
10.0000 mg | ORAL_TABLET | Freq: Every day | ORAL | 0 refills | Status: AC
  Filled 2024-02-07: qty 30, 30d supply, fill #0

## 2024-02-07 MED ORDER — TRAZODONE HCL 100 MG PO TABS
200.0000 mg | ORAL_TABLET | Freq: Every evening | ORAL | 0 refills | Status: DC | PRN
  Filled 2024-02-07: qty 28, 14d supply, fill #0

## 2024-02-07 MED ORDER — PALIPERIDONE ER 1.5 MG PO TB24
1.5000 mg | ORAL_TABLET | Freq: Every day | ORAL | 0 refills | Status: DC
  Filled 2024-02-07: qty 7, 7d supply, fill #0

## 2024-02-07 MED ORDER — NICOTINE POLACRILEX 2 MG MT GUM: 2.0000 mg | CHEWING_GUM | OROMUCOSAL | 0 refills | Status: AC | PRN

## 2024-02-07 MED ORDER — HYDROXYZINE HCL 50 MG PO TABS: 50.0000 mg | ORAL_TABLET | Freq: Three times a day (TID) | ORAL | 0 refills | Status: AC | PRN

## 2024-02-07 MED ORDER — LITHIUM CARBONATE ER 300 MG PO TBCR
300.0000 mg | EXTENDED_RELEASE_TABLET | Freq: Two times a day (BID) | ORAL | 0 refills | Status: DC
  Filled 2024-02-07: qty 60, 30d supply, fill #0

## 2024-02-07 MED ORDER — MELATONIN 10 MG PO TABS: 10.0000 mg | ORAL_TABLET | Freq: Every day | ORAL | 0 refills | Status: DC

## 2024-02-07 MED ORDER — MIRTAZAPINE 7.5 MG PO TABS: 7.5000 mg | ORAL_TABLET | Freq: Every day | ORAL | 0 refills | Status: DC

## 2024-02-07 MED ORDER — MIRTAZAPINE 7.5 MG PO TABS
7.5000 mg | ORAL_TABLET | Freq: Every day | ORAL | 0 refills | Status: DC
  Filled 2024-02-07: qty 30, 30d supply, fill #0

## 2024-02-07 MED ORDER — NICOTINE POLACRILEX 4 MG MT GUM: 4.0000 mg | CHEWING_GUM | OROMUCOSAL | 0 refills | Status: DC | PRN

## 2024-02-07 MED ORDER — PALIPERIDONE PALMITATE ER 234 MG/1.5ML IM SUSY
156.0000 mg | PREFILLED_SYRINGE | Freq: Once | INTRAMUSCULAR | 0 refills | Status: DC
Start: 2024-02-14 — End: 2024-06-01

## 2024-02-07 NOTE — Progress Notes (Signed)
 Patient is set for discharge today to [facility].  Invega injection obtained and given. Awaiting discharge instructions.

## 2024-02-07 NOTE — Progress Notes (Signed)
  [facility] Adult Case Management Discharge Plan :  Will you be returning to the same living situation after discharge:  No.Patient to attend treatment at [facility].  At discharge, do you have transportation home?: Yes,  CSW to arrange transportation on patient's behalf.  Do you have the ability to pay for your medications: Yes, [facility]   Release of information consent forms completed and in the chart;  Patient's signature needed at discharge.  Patient to Follow up at:  Follow-up Information     [facility]. Go to.   Specialty: Behavioral Health Why: Patient to receive inpatient programming while in treatment. Contact information: [address] [place] [address] [phone]                Next level of care provider has access to [facility] Link:no  Safety Planning and Suicide Prevention discussed: Yes, Education Completed; Jeremiah Wulf Sr., [phone], Father, has been identified by the patient as the family member/significant other with whom the patient will be residing, and identified as the person(s) who will aid the patient in the event of a mental health crisis (suicidal ideations/suicide attempt).  With written consent from the patient, the family member/significant other has been provided the following suicide prevention education, prior to the and/or following the discharge of the patient.      Has patient been referred to the Quitline?: Patient refused referral for treatment  Patient has been referred for addiction treatment: Yes, the patient will follow up with an inpatient provider for substance use disorder. Psychiatrist/APP: appointment made and Therapist: appointment made  Lowry Ram, LCSW 02/07/2024, 12:32 PM

## 2024-02-07 NOTE — Group Note (Signed)
 Recreation Therapy Group Note   Group Topic:Leisure Education  Group Date: 02/07/2024 Start Time: 1000 End Time: 1100 Facilitators: Rosina Lowenstein, LRT, CTRS Location:  Craft Room  Group Description: Leisure. Patients were given the option to choose from singing karaoke, coloring mandalas, using oil pastels, journaling, or playing with play-doh. LRT and pts discussed the meaning of leisure, the importance of participating in leisure during their free time/when they're outside of the hospital, as well as how our leisure interests can also serve as coping skills.   Goal Area(s) Addressed:  Patient will identify a current leisure interest.  Patient will learn the definition of "leisure". Patient will practice making a positive decision. Patient will have the opportunity to try a new leisure activity. Patient will communicate with peers and LRT.    Affect/Mood: Appropriate   Participation Level: Active and Engaged   Participation Quality: Independent   Behavior: Appropriate, Calm, and Cooperative   Speech/Thought Process: Coherent   Insight: Good   Judgement: Good   Modes of Intervention: Clarification, Education, Exploration, Guided Discussion, Music, Open Conversation, Rapport Building, and Socialization   Patient Response to Interventions:  Attentive, Engaged, Interested , and Receptive   Education Outcome:  Acknowledges education   Clinical Observations/Individualized Feedback: Hunt was active in their participation of session activities and group discussion. Pt identified "play cards and workout" as things he does in his free time. Pt came late to group, however joined with no issue. Pt chose to listen to music and make song requests while in group. Pt interacted well with LRT and peers duration of session.    Plan: Continue to engage patient in RT group sessions 2-3x/week.   Rosina Lowenstein, LRT, CTRS 02/07/2024 12:05 PM

## 2024-02-07 NOTE — Discharge Summary (Signed)
 Physician Discharge Summary Note  Patient:  Jeremiah Newman is an 33 y.o., male MRN:  [id number] DOB:  1991-08-26 Patient phone:  [phone] (home)  Patient address:   [address] [place] [address],  Total Time spent with patient: 2 hours  Date of Admission:  01/30/2024 Date of Discharge: 02/07/2024  Reason for Admission:   33 year old Caucasian male with a history of schizoaffective disorder, PTSD, and polysubstance use disorder (methamphetamine, fentanyl, heroin, cocaine, and marijuana use in the past), presented to the emergency department with auditory hallucinations, visual hallucinations, and suicidal ideation (SI) with a plan to overdose on Seroquel or Remeron.The patient reported increasing distress, panic attacks, chronic sleep disturbances, and persistent suicidal thoughts for the past four months. He described hearing multiple voices laughing at him, whispering threats, and saying "we're going to kill him." Additionally, he reported seeing shadows with faces and feeling overwhelmed by stress to the point of wanting to kill himself.He stated that he could not return home due to worsening paranoia and psychotic symptoms, frequently waking his parents up at night and insisting that there were intruders outside, telling them to get their guns. He acknowledged that he had stopped taking his prescribed medications for at least a week, which coincided with the worsening of symptoms. While in the ED, he admitted to having a shot of liquor the day prior and, per [facility] records, he last used methamphetamine and fentanyl approximately one week ago. However, at other points during the evaluation, he claimed not to have used any substances in six to seven years, despite chart inconsistencies.His recent history includes a previous discharge from this inpatient behavioral health unit in January, followed by a transfer to [facility] for inpatient substance use treatment. However, he reported difficulties at [facility]  and was later sent to another facility, where he stayed briefly before leaving due to dissatisfaction. Earlier this week, he presented to another facility ("[facility]"), stayed for three hours, then called law enforcement and stated he was suicidal. He was transported to [facility] in [place], but it remains unclear why he was discharged the same day, despite his claims of experiencing ongoing hallucinations and suicidal ideation. Given the patient's persistent psychotic symptoms, history of non-adherence to medications, recent substance use relapse, and worsening suicidal ideation, he was admitted voluntarily for acute psychiatric stabilization, medication management, and further assessment of risk factors.  Principal Problem: Schizoaffective disorder, bipolar type [facility]) Discharge Diagnoses: Principal Problem:   Schizoaffective disorder, bipolar type [facility])   Past Psychiatric History: see below  Past Medical History:  Past Medical History:  Diagnosis Date   Schizophrenia (HCC)    History reviewed. No pertinent surgical history. Family History: History reviewed. No pertinent family history. Family Psychiatric  History: Mother Depression Social History:  Social History   Substance and Sexual Activity  Alcohol Use Yes     Social History   Substance and Sexual Activity  Drug Use No    Social History   Socioeconomic History   Marital status: Single    Spouse name: Not on file   Number of children: Not on file   Years of education: Not on file   Highest education level: Not on file  Occupational History   Not on file  Tobacco Use   Smoking status: Some Days    Current packs/day: 1.00    Average packs/day: 1 pack/day for 9.2 years (9.2 ttl pk-yrs)    Types: Cigarettes    Start date: 2016   Smokeless tobacco: Never  Vaping Use  Vaping status: Every Day  Substance and Sexual Activity   Alcohol use: Yes   Drug use: No   Sexual activity: Not Currently  Other Topics Concern   Not on  file  Social History Narrative   Not on file   Social Drivers of Health   Financial Resource Strain: Not on file  Food Insecurity: No Food Insecurity (01/30/2024)   Hunger Vital Sign    Worried About Running Out of Food in the Last Year: Never true    Ran Out of Food in the Last Year: Never true  Recent Concern: Food Insecurity - Food Insecurity Present (12/18/2023)   Hunger Vital Sign    Worried About Running Out of Food in the Last Year: Often true    Ran Out of Food in the Last Year: Often true  Transportation Needs: No Transportation Needs (01/30/2024)   PRAPARE - Administrator, Civil Service (Medical): No    Lack of Transportation (Non-Medical): No  Recent Concern: Transportation Needs - Unmet Transportation Needs (12/18/2023)   PRAPARE - Administrator, Civil Service (Medical): Yes    Lack of Transportation (Non-Medical): Yes  Physical Activity: Not on file  Stress: Not on file  Social Connections: Unknown (01/30/2024)   Social Connection and Isolation Panel [NHANES]    Frequency of Communication with Friends and Family: More than three times a week    Frequency of Social Gatherings with Friends and Family: More than three times a week    Attends Religious Services: Patient declined    Database administrator or Organizations: No    Attends Banker Meetings: Never    Marital Status: Never married    Hospital Course:  The patient was admitted voluntarily for acute psychiatric stabilization due to persistent suicidal ideation (SI), auditory and visual hallucinations, medication non-adherence, and recent substance use relapse. Upon admission, he endorsed hearing multiple voices laughing and whispering threats, seeing shadows with faces, experiencing panic attacks, and struggling with chronic insomnia. He reported worsening distress over the past four months, with continued suicidal thoughts and a plan to overdose on Seroquel or Remeron. He also  expressed paranoid thoughts, frequently waking his parents at night, believing intruders were outside, and instructing them to harm themselves.The patient admitted to being off his prescribed medications for at least one week prior to admission, which likely contributed to worsening symptoms.He was initiated on Invega (paliperidone) 3 mg daily, which was gradually increased to 6 mg daily with plans for transition to a long-acting injectable (LAI).He received his first dose of Invega Sustenna 256 mg IM during hospitalization.A 7-day bridge of oral Invega 3 mg daily was initiated to ensure therapeutic coverage while awaiting full effect from the LAI.Trazodone was increased to 150 mg at bedtime to address chronic sleep disturbances and depressive symptoms.Hydroxyzine 50 mg TID PRN was continued for anxiety management.,Lithium 300 mg BID was continued for mood stabilization and depressive symptoms.He was noted to be less internally preoccupied, was able to carry a linear conversation, and denied ongoing hallucinations at the time of discharge.Insight into his condition improved, and he acknowledged the importance of medication adherence.He expressed motivation for continued treatment and stated, "I know I will do better."The patient was excited and motivated for discharge to [facility] for continued care and structured support.He denied any ongoing suicidal ideation (SI), homicidal ideation (HI), or active psychotic symptoms at discharge.Follow-up is scheduled in 7 days for his second Invega Sustenna LAI injection (156 mg IM).He was  provided with crisis resources and encouraged to continue therapy and engage in substance use recovery support programs.Firearm safety was reinforced, and the patient agreed to avoid access to weapons.The patient met criteria for optimized hospitalization, demonstrated improved stability, and was deemed appropriate for discharge with structured aftercare in place.  Physical  Findings: AIMS: Facial and Oral Movements Muscles of Facial Expression: None Lips and Perioral Area: None Tongue: None,Extremity Movements Upper (arms, wrists, hands, fingers): None Lower (legs, knees, ankles, toes): None, Trunk Movements Neck, shoulders, hips: None, Global Judgements Severity of abnormal movements overall : None Incapacitation due to abnormal movements: None Patient's awareness of abnormal movements: No Awareness, Dental Status Current problems with teeth and/or dentures?: No Does patient usually wear dentures?: No Edentia?: No  CIWA:    COWS:     Musculoskeletal: Strength & Muscle Tone: within normal limits Gait & Station: normal Patient leans: N/A   Psychiatric Specialty Exam:  Presentation  General Appearance:  Appropriate for Environment  Eye Contact: Good (Cooperative, engaged, and demonstrates motivation for treatment.)  Speech: Clear and Coherent; Normal Rate  Speech Volume: Normal  Handedness: Right   Mood and Affect  Mood: Euthymic  Affect: Flat; Appropriate   Thought Process  Thought Processes: Coherent  Descriptions of Associations:Intact  Orientation:Full (Time, Place and Person) (and situation)  Thought Content:Logical; WDL  History of Schizophrenia/Schizoaffective disorder:Yes  Duration of Psychotic Symptoms:-- (resolved)  Hallucinations:Hallucinations: None Description of Auditory Hallucinations: denies Description of Visual Hallucinations: denies  Ideas of Reference:None  Suicidal Thoughts:Suicidal Thoughts: No SI Active Intent and/or Plan: -- (denies) SI Passive Intent and/or Plan: -- (denies)  Homicidal Thoughts:Homicidal Thoughts: No   Sensorium  Memory: Immediate Good; Recent Good; Remote Good  Judgment: Good  Insight: Good (Improved insight into illness and treatment needs; judgment intact.)   Executive Functions  Concentration: Fair  Attention Span: Fair  Recall: Good  Fund of  Knowledge: Good  Language: Good   Psychomotor Activity  Psychomotor Activity: Psychomotor Activity: Normal   Assets  Assets: Communication Skills; Social Support; Resilience; Desire for Improvement   Sleep  Sleep: Sleep: Good Number of Hours of Sleep: 7    Physical Exam: Physical Exam Vitals and nursing note reviewed.  Constitutional:      Appearance: Normal appearance.  HENT:     Head: Normocephalic and atraumatic.     Nose: Nose normal.  Pulmonary:     Effort: Pulmonary effort is normal.  Musculoskeletal:        General: Normal range of motion.     Cervical back: Normal range of motion.  Neurological:     General: No focal deficit present.     Mental Status: He is alert and oriented to person, place, and time. Mental status is at baseline.  Psychiatric:        Attention and Perception: Attention and perception normal.        Mood and Affect: Mood and affect normal.        Speech: Speech normal.        Behavior: Behavior normal. Behavior is cooperative.        Thought Content: Thought content normal.        Cognition and Memory: Cognition and memory normal.        Judgment: Judgment is impulsive.    ROS Blood pressure 123/79, pulse 70, temperature 97.8 F (36.6 C), temperature source Oral, resp. rate 18, height 5\' 7"  (1.702 m), weight 98.4 kg, SpO2 100%. Body mass index is 33.99 kg/m.   Social  History   Tobacco Use  Smoking Status Some Days   Current packs/day: 1.00   Average packs/day: 1 pack/day for 9.2 years (9.2 ttl pk-yrs)   Types: Cigarettes   Start date: 2016  Smokeless Tobacco Never   Tobacco Cessation:  A prescription for an FDA-approved tobacco cessation medication provided at discharge   Blood Alcohol level:  Lab Results  Component Value Date   [facility] <10 01/30/2024   ETH <10 12/18/2023    Metabolic Disorder Labs:  Lab Results  Component Value Date   HGBA1C 4.7 (L) 12/23/2023   MPG 88.19 12/23/2023   MPG 93.93 10/10/2023   No  results found for: "PROLACTIN" Lab Results  Component Value Date   CHOL 215 (H) 12/23/2023   TRIG 297 (H) 12/23/2023   HDL 40 (L) 12/23/2023   CHOLHDL 5.4 12/23/2023   VLDL 59 (H) 12/23/2023   LDLCALC 116 (H) 12/23/2023   LDLCALC 146 (H) 10/10/2023    See Psychiatric Specialty Exam and Suicide Risk Assessment completed by Attending Physician prior to discharge.  Discharge destination:  Other:  [facility]  Is patient on multiple antipsychotic therapies at discharge:  No   Has Patient had three or more failed trials of antipsychotic monotherapy by history:  No  Recommended Plan for Multiple Antipsychotic Therapies: Patient's medications are in the process of a cross-taper;  medications include:  INVEGA LAI   Allergies as of 02/07/2024   No Known Allergies      Medication List     STOP taking these medications    gabapentin 300 MG capsule Commonly known as: NEURONTIN   lithium carbonate 300 MG capsule Replaced by: lithium carbonate 300 MG ER tablet   nicotine 21 mg/24hr patch Commonly known as: NICODERM CQ - dosed in mg/24 hours   OLANZapine 15 MG tablet Commonly known as: ZYPREXA   pantoprazole 40 MG tablet Commonly known as: PROTONIX       TAKE these medications      Indication  hydrOXYzine 50 MG tablet Commonly known as: ATARAX Take 1 tablet (50 mg total) by mouth 3 (three) times daily as needed for anxiety.  Indication: Feeling Anxious   lithium carbonate 300 MG ER tablet Commonly known as: LITHOBID Take 1 tablet (300 mg total) by mouth every 12 (twelve) hours. Replaces: lithium carbonate 300 MG capsule  Indication: Schizoaffective Disorder, needs follow up with provider for blood work   Melatonin 10 MG Tabs Take 10 mg by mouth at bedtime. What changed:  medication strength how much to take  Indication: Trouble Sleeping   mirtazapine 7.5 MG tablet Commonly known as: REMERON Take 1 tablet (7.5 mg total) by mouth at bedtime.  Indication:  Panic Disorder   nicotine polacrilex 2 MG gum Commonly known as: NICORETTE Take 1 each (2 mg total) by mouth as needed for smoking cessation. What changed: Another medication with the same name was added. Make sure you understand how and when to take each.  Indication: Nicotine Addiction   nicotine polacrilex 4 MG gum Commonly known as: NICORETTE Take 1 each (4 mg total) by mouth as needed for up to 28 days for smoking cessation. What changed: You were already taking a medication with the same name, and this prescription was added. Make sure you understand how and when to take each.  Indication: Nicotine Addiction   paliperidone 1.5 MG 24 hr tablet Commonly known as: INVEGA Take 1 tablet (1.5 mg total) by mouth daily for 7 days. Start taking on: February 08, 2024  Indication: Schizophrenia   paliperidone 234 MG/1.5ML injection Commonly known as: INVEGA SUSTENNA Inject 156 mg into the muscle once for 1 dose. Start taking on: February 14, 2024  Indication: Schizophrenia   traZODone 100 MG tablet Commonly known as: DESYREL Take 2 tablets (200 mg total) by mouth at bedtime as needed for up to 28 days for sleep.  Indication: Schizophrenia with Depression        Follow-up Information     [facility]. Go to.   Specialty: Behavioral Health Why: Patient to receive inpatient programming while in treatment. Contact information: [address] [place] [address] [phone]                Follow-up recommendations:  Activity:  as tolerated Diet:  heart healthy  Comments:  ? Invega Sustenna 256 mg LAI received - Initial long-acting injection (LAI) dose. ? Invega 3 mg daily for 7 days - Oral bridge while transitioning to LAI. ? Follow-up scheduled for Invega Sustenna 156 mg LAI in 7 days - To complete loading phase. ? Remeron (Mirtazapine) 7.5 mg HS - For sleep support and mood stabilization. ? Hydroxyzine 50 mg TID PRN - For  anxiety management. ? Trazodone 150 mg HS - For sleep disturbances and depressive symptoms. ? Lithium 300 mg BID - For mood stabilization and depressive symptoms. ??  Transition to [facility] for continued care. ?? Follow up in 7 days for Invega 156 mg LAI injection. ?? Continue engagement in therapy and substance use support programs. ?? Monitor for mood stability, medication adherence, and side effects. National Suicide & Crisis Lifeline: ?? 988 or 1-800-273-TALK 838 135 0862) - Free, confidential support. Crisis Text Line: ?? Text HELLO to [phone] - Connect with a trained crisis counseling  Signed: [name], NP 02/07/2024, 3:30 PM
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
## 2024-02-07 NOTE — Progress Notes (Signed)
 Patient escorted to the medical mall exit with belongings, paperwork, prescriptions, medications and voucher for taxi.

## 2024-02-07 NOTE — Group Note (Signed)
 Date:  02/07/2024 Time:  1:35 PM  Group Topic/Focus:  Rediscovering Joy:   The focus of this group is to explore various ways to relieve stress in a positive manner.    Participation Level:  Active  Participation Quality:  Appropriate  Affect:  Appropriate  Cognitive:  Appropriate  Insight: Appropriate  Engagement in Group:  Engaged  Modes of Intervention:  Activity  Additional Comments:    Mary Sella Watson [place] 02/07/2024, 1:35 PM

## 2024-02-07 NOTE — Group Note (Signed)
 Date:  02/07/2024 Time:  4:40 PM  Group Topic/Focus:  Activity Group: The focus of the group is to promote activity for the patients and to encourage them to go outside in the courtyard and get some fresh air and some exercise.    Participation Level:  Active  Participation Quality:  Appropriate  Affect:  Appropriate  Cognitive:  Appropriate  Insight: Appropriate  Engagement in Group:  Engaged  Modes of Intervention:  Activity  Additional Comments:    Mary Sella Menachem Urbanek 02/07/2024, 4:40 PM

## 2024-02-07 NOTE — Plan of Care (Signed)
   Problem: Education: Goal: Knowledge of [place] Education information/materials will improve Outcome: Progressing Goal: Emotional status will improve Outcome: Progressing

## 2024-02-07 NOTE — BHH Counselor (Addendum)
 CSW touched base with [facility] at [phone] after patient was told there was a bed available. [facility] confirmed bed and explained that this is a 7 day detox program with opportunity for the patient to complete a longer residential program after the 7 days if desired.   Program requested paperwork on patient's behalf in order to hold the patient's bed. CSW sent requested documentation on patient's behalf.  This has been communicated with patient and team to engage in safe discharge planning.    Reymundo Poll, MSW, LCSWA 02/07/2024 2:53 PM

## 2024-02-07 NOTE — Plan of Care (Signed)
Education completed for discharge

## 2024-05-21 ENCOUNTER — Inpatient Hospital Stay (HOSPITAL_COMMUNITY)
Admission: AD | Admit: 2024-05-21 | Discharge: 2024-06-01 | DRG: 885 | Disposition: A | Discharge status: Home / Self Care | Payer: MEDICAID | Source: Intra-hospital

## 2024-05-21 ENCOUNTER — Encounter (HOSPITAL_COMMUNITY): Payer: Self-pay | Admitting: Psychiatry

## 2024-05-21 ENCOUNTER — Other Ambulatory Visit: Payer: Self-pay

## 2024-05-21 ENCOUNTER — Emergency Department (HOSPITAL_COMMUNITY)
Admission: EM | Admit: 2024-05-21 | Discharge: 2024-05-21 | Disposition: A | Discharge status: Other Acute Inpatient Hospital | Payer: MEDICAID | Attending: Emergency Medicine | Admitting: Emergency Medicine

## 2024-05-21 ENCOUNTER — Encounter (HOSPITAL_COMMUNITY): Payer: Self-pay

## 2024-05-21 DIAGNOSIS — R45851 Suicidal ideations: Secondary | ICD-10-CM | POA: Diagnosis present

## 2024-05-21 DIAGNOSIS — F410 Panic disorder [episodic paroxysmal anxiety] without agoraphobia: Secondary | ICD-10-CM | POA: Diagnosis present

## 2024-05-21 DIAGNOSIS — F29 Unspecified psychosis not due to a substance or known physiological condition: Principal | ICD-10-CM | POA: Diagnosis present

## 2024-05-21 DIAGNOSIS — F19959 Other psychoactive substance use, unspecified with psychoactive substance-induced psychotic disorder, unspecified: Secondary | ICD-10-CM | POA: Diagnosis not present

## 2024-05-21 DIAGNOSIS — Z9151 Personal history of suicidal behavior: Secondary | ICD-10-CM

## 2024-05-21 DIAGNOSIS — R440 Auditory hallucinations: Secondary | ICD-10-CM | POA: Insufficient documentation

## 2024-05-21 DIAGNOSIS — F11259 Opioid dependence with opioid-induced psychotic disorder, unspecified: Secondary | ICD-10-CM | POA: Diagnosis present

## 2024-05-21 DIAGNOSIS — F4310 Post-traumatic stress disorder, unspecified: Secondary | ICD-10-CM | POA: Diagnosis present

## 2024-05-21 DIAGNOSIS — G4700 Insomnia, unspecified: Secondary | ICD-10-CM | POA: Diagnosis present

## 2024-05-21 DIAGNOSIS — F17210 Nicotine dependence, cigarettes, uncomplicated: Secondary | ICD-10-CM | POA: Diagnosis present

## 2024-05-21 DIAGNOSIS — F1123 Opioid dependence with withdrawal: Secondary | ICD-10-CM | POA: Diagnosis present

## 2024-05-21 DIAGNOSIS — F22 Delusional disorders: Secondary | ICD-10-CM | POA: Diagnosis present

## 2024-05-21 DIAGNOSIS — Z5982 Transportation insecurity: Secondary | ICD-10-CM

## 2024-05-21 DIAGNOSIS — F259 Schizoaffective disorder, unspecified: Secondary | ICD-10-CM | POA: Diagnosis present

## 2024-05-21 DIAGNOSIS — F17290 Nicotine dependence, other tobacco product, uncomplicated: Secondary | ICD-10-CM | POA: Diagnosis present

## 2024-05-21 DIAGNOSIS — Z5941 Food insecurity: Secondary | ICD-10-CM | POA: Diagnosis not present

## 2024-05-21 DIAGNOSIS — Z5986 Financial insecurity: Secondary | ICD-10-CM | POA: Diagnosis not present

## 2024-05-21 DIAGNOSIS — Z5901 Sheltered homelessness: Secondary | ICD-10-CM | POA: Diagnosis not present

## 2024-05-21 DIAGNOSIS — Z560 Unemployment, unspecified: Secondary | ICD-10-CM | POA: Diagnosis not present

## 2024-05-21 DIAGNOSIS — F419 Anxiety disorder, unspecified: Secondary | ICD-10-CM | POA: Insufficient documentation

## 2024-05-21 DIAGNOSIS — Z79899 Other long term (current) drug therapy: Secondary | ICD-10-CM | POA: Insufficient documentation

## 2024-05-21 DIAGNOSIS — F39 Unspecified mood [affective] disorder: Secondary | ICD-10-CM | POA: Insufficient documentation

## 2024-05-21 DIAGNOSIS — F1190 Opioid use, unspecified, uncomplicated: Secondary | ICD-10-CM | POA: Insufficient documentation

## 2024-05-21 DIAGNOSIS — F250 Schizoaffective disorder, bipolar type: Principal | ICD-10-CM | POA: Diagnosis present

## 2024-05-21 LAB — CBC
HCT: 40.6 % (ref 39.0–52.0)
Hemoglobin: 14.2 g/dL (ref 13.0–17.0)
MCH: 28 pg (ref 26.0–34.0)
MCHC: 35 g/dL (ref 30.0–36.0)
MCV: 79.9 fL — ABNORMAL LOW (ref 80.0–100.0)
Platelets: 283 10*3/uL (ref 150–400)
RBC: 5.08 MIL/uL (ref 4.22–5.81)
RDW: 12.4 % (ref 11.5–15.5)
WBC: 10.3 10*3/uL (ref 4.0–10.5)
nRBC: 0 % (ref 0.0–0.2)

## 2024-05-21 LAB — COMPREHENSIVE METABOLIC PANEL WITH GFR
ALT: 15 U/L (ref 0–44)
AST: 21 U/L (ref 15–41)
Albumin: 4.6 g/dL (ref 3.5–5.0)
Alkaline Phosphatase: 61 U/L (ref 38–126)
Anion gap: 13 (ref 5–15)
BUN: 16 mg/dL (ref 6–20)
CO2: 22 mmol/L (ref 22–32)
Calcium: 9.2 mg/dL (ref 8.9–10.3)
Chloride: 99 mmol/L (ref 98–111)
Creatinine, Ser: 0.97 mg/dL (ref 0.61–1.24)
GFR, Estimated: >60 mL/min (ref >60)
Glucose, Bld: 104 mg/dL — ABNORMAL HIGH (ref 70–99)
Potassium: 3.1 mmol/L — ABNORMAL LOW (ref 3.5–5.1)
Sodium: 134 mmol/L — ABNORMAL LOW (ref 135–145)
Total Bilirubin: 1.1 mg/dL (ref 0.0–1.2)
Total Protein: 7.9 g/dL (ref 6.5–8.1)

## 2024-05-21 LAB — ETHANOL: Alcohol, Ethyl (B): <15 mg/dL (ref <15)

## 2024-05-21 LAB — RAPID URINE DRUG SCREEN, HOSP PERFORMED
Amphetamines: NOT DETECTED
Barbiturates: NOT DETECTED
Benzodiazepines: NOT DETECTED
Cocaine: NOT DETECTED
Opiates: NOT DETECTED
Tetrahydrocannabinol: NOT DETECTED

## 2024-05-21 LAB — LITHIUM LEVEL: Lithium Lvl: 0.07 mmol/L — ABNORMAL LOW (ref 0.60–1.20)

## 2024-05-21 MED ORDER — MIRTAZAPINE 7.5 MG PO TABS: 7.5000 mg | ORAL_TABLET | Freq: Every day | ORAL | Status: DC

## 2024-05-21 MED ORDER — MELATONIN 3 MG PO TABS
3.0000 mg | ORAL_TABLET | Freq: Every day | ORAL | Status: DC
  Administered 2024-05-21 – 2024-05-31 (×11): 3 mg via ORAL
  Filled 2024-05-21 (×11): qty 1

## 2024-05-21 MED ORDER — HALOPERIDOL LACTATE 5 MG/ML IJ SOLN: 10.0000 mg | Freq: Three times a day (TID) | INTRAMUSCULAR | Status: DC | PRN

## 2024-05-21 MED ORDER — IBUPROFEN 200 MG PO TABS: 600.0000 mg | ORAL_TABLET | Freq: Three times a day (TID) | ORAL | Status: DC | PRN

## 2024-05-21 MED ORDER — DIPHENHYDRAMINE HCL 25 MG PO CAPS
50.0000 mg | ORAL_CAPSULE | Freq: Three times a day (TID) | ORAL | Status: DC | PRN
  Administered 2024-05-25: 50 mg via ORAL
  Filled 2024-05-21: qty 2

## 2024-05-21 MED ORDER — RISPERIDONE 0.5 MG PO TABS
2.0000 mg | ORAL_TABLET | Freq: Every day | ORAL | Status: DC
Start: 2024-05-21 — End: 2024-05-21

## 2024-05-21 MED ORDER — HYDROXYZINE HCL 25 MG PO TABS
25.0000 mg | ORAL_TABLET | Freq: Once | ORAL | Status: AC
  Administered 2024-05-21: 25 mg via ORAL
  Filled 2024-05-21: qty 1

## 2024-05-21 MED ORDER — LORAZEPAM 2 MG/ML IJ SOLN: 2.0000 mg | Freq: Three times a day (TID) | INTRAMUSCULAR | Status: DC | PRN

## 2024-05-21 MED ORDER — ALUM & MAG HYDROXIDE-SIMETH 200-200-20 MG/5ML PO SUSP: 30.0000 mL | ORAL | Status: DC | PRN

## 2024-05-21 MED ORDER — MIRTAZAPINE 7.5 MG PO TABS
7.5000 mg | ORAL_TABLET | Freq: Every day | ORAL | Status: DC
  Administered 2024-05-21: 7.5 mg via ORAL
  Filled 2024-05-21: qty 1

## 2024-05-21 MED ORDER — ALUM & MAG HYDROXIDE-SIMETH 200-200-20 MG/5ML PO SUSP: 30.0000 mL | Freq: Four times a day (QID) | ORAL | Status: DC | PRN

## 2024-05-21 MED ORDER — MELATONIN 3 MG PO TABS: 3.0000 mg | ORAL_TABLET | Freq: Every day | ORAL | Status: DC

## 2024-05-21 MED ORDER — RISPERIDONE 2 MG PO TABS
2.0000 mg | ORAL_TABLET | Freq: Every day | ORAL | Status: DC
Start: 2024-05-21 — End: 2024-05-27
  Administered 2024-05-21 – 2024-05-26 (×6): 2 mg via ORAL
  Filled 2024-05-21 (×6): qty 1

## 2024-05-21 MED ORDER — LITHIUM CARBONATE ER 300 MG PO TBCR
300.0000 mg | EXTENDED_RELEASE_TABLET | Freq: Two times a day (BID) | ORAL | Status: DC
  Administered 2024-05-21: 300 mg via ORAL
  Filled 2024-05-21: qty 1

## 2024-05-21 MED ORDER — ONDANSETRON HCL 4 MG PO TABS: 4.0000 mg | ORAL_TABLET | Freq: Three times a day (TID) | ORAL | Status: DC | PRN

## 2024-05-21 MED ORDER — MAGNESIUM HYDROXIDE 400 MG/5ML PO SUSP
30.0000 mL | Freq: Every day | ORAL | Status: DC | PRN
  Administered 2024-05-24: 30 mL via ORAL
  Filled 2024-05-21: qty 30

## 2024-05-21 MED ORDER — TRAZODONE HCL 100 MG PO TABS: 200.0000 mg | ORAL_TABLET | Freq: Every evening | ORAL | Status: DC | PRN

## 2024-05-21 MED ORDER — IBUPROFEN 600 MG PO TABS: 600.0000 mg | ORAL_TABLET | Freq: Three times a day (TID) | ORAL | Status: DC | PRN

## 2024-05-21 MED ORDER — LITHIUM CARBONATE ER 300 MG PO TBCR
300.0000 mg | EXTENDED_RELEASE_TABLET | Freq: Two times a day (BID) | ORAL | Status: DC
  Administered 2024-05-21 – 2024-05-27 (×12): 300 mg via ORAL
  Filled 2024-05-21 (×12): qty 1

## 2024-05-21 MED ORDER — PRAZOSIN HCL 1 MG PO CAPS: 2.0000 mg | ORAL_CAPSULE | Freq: Every day | ORAL | Status: DC

## 2024-05-21 MED ORDER — HALOPERIDOL 5 MG PO TABS: 5.0000 mg | ORAL_TABLET | Freq: Three times a day (TID) | ORAL | Status: DC | PRN

## 2024-05-21 MED ORDER — DIPHENHYDRAMINE HCL 50 MG/ML IJ SOLN: 50.0000 mg | Freq: Three times a day (TID) | INTRAMUSCULAR | Status: DC | PRN

## 2024-05-21 MED ORDER — POTASSIUM CHLORIDE CRYS ER 20 MEQ PO TBCR
40.0000 meq | EXTENDED_RELEASE_TABLET | Freq: Once | ORAL | Status: AC
  Administered 2024-05-21: 40 meq via ORAL
  Filled 2024-05-21: qty 2

## 2024-05-21 MED ORDER — ONDANSETRON HCL 4 MG PO TABS
4.0000 mg | ORAL_TABLET | Freq: Once | ORAL | Status: AC
  Administered 2024-05-21: 4 mg via ORAL
  Filled 2024-05-21: qty 1

## 2024-05-21 MED ORDER — ACETAMINOPHEN 325 MG PO TABS
650.0000 mg | ORAL_TABLET | Freq: Four times a day (QID) | ORAL | Status: DC | PRN
  Administered 2024-05-26 – 2024-05-29 (×4): 650 mg via ORAL
  Filled 2024-05-21 (×4): qty 2

## 2024-05-21 MED ORDER — HALOPERIDOL LACTATE 5 MG/ML IJ SOLN: 5.0000 mg | Freq: Three times a day (TID) | INTRAMUSCULAR | Status: DC | PRN

## 2024-05-21 NOTE — Tx Team (Signed)
 Initial Treatment Plan 05/21/2024 6:26 PM Jeremiah Newman ZOX:[id number]    PATIENT STRESSORS: Financial difficulties   Medication change or noncompliance   Substance abuse     PATIENT STRENGTHS: Ability for insight  Communication skills  Motivation for treatment/growth    PATIENT IDENTIFIED PROBLEMS: Get into a good program  Get medication right  Suicidal thoughts  Depression  Anxiety  Auditory hallucinations  Homelessness         DISCHARGE CRITERIA:  Adequate post-discharge living arrangements Motivation to continue treatment in a less acute level of care  PRELIMINARY DISCHARGE PLAN: Attend aftercare/continuing care group Outpatient therapy Return to previous living arrangement  PATIENT/FAMILY INVOLVEMENT: This treatment plan has been presented to and reviewed with the patient, Jeremiah Newman, and/or family member.  The patient and family have been given the opportunity to ask questions and make suggestions.  [name], RN 05/21/2024, 6:26 PM

## 2024-05-21 NOTE — ED Notes (Signed)
 Patient belongings moved to locker # 37 in [place]. Ziplock storage bag and white patient belonging bag

## 2024-05-21 NOTE — Consult Note (Signed)
 [facility] Psychiatric Consult Initial  Patient Name: .Jeremiah Newman  MRN: [id number]  DOB: September 09, 1991  Consult Order details:  Orders (From admission, onward)     Start     Ordered   05/21/24 0905  CONSULT TO CALL ACT TEAM       Ordering Provider: Darlis Eisenmenger, PA-C  Provider:  (Not yet assigned)  Question:  Reason for Consult?  Answer:  Psych consult   05/21/24 0904             Mode of Visit: In person    Psychiatry Consult Evaluation  Service Date: May 21, 2024 LOS:  LOS: 0 days  Chief Complaint SI  Primary Psychiatric Diagnoses  Schizoaffective disorder, bipolar type 2.   PTSD 3.   Anxiety  Assessment  Jeremiah Newman is a 33 y.o. male admitted: Presented to the ED on 05/21/2024  7:23 AM for SI w/ plan to jump in traffic, hearing voices telling him to kill himself, and panic attacks x months. He carries the psychiatric diagnoses of schizo-affective disorder, bipolar, anxiety and has a past medical history of none.   His current presentation of racing thoughts, hallucinations, panic disorder, poor appetite and sleep is most consistent with decompensating mental illness. He meets criteria for inpatient psychiatric admission based on current presentation.  Current outpatient psychotropic medications include Risperdal, prazosin, Remeron  and historically he has had a beneficial response to these medications. He was compliant with medications prior to admission as evidenced by patient report. On initial examination, patient pleasant and cooperative. Please see plan below for detailed recommendations.   Diagnoses:  Active Hospital problems: Principal Problem:   Schizoaffective disorder, bipolar type (HCC) Active Problems:   PTSD (post-traumatic stress disorder)    Plan   ## Psychiatric Medication Recommendations:  Continue home medications of Zyprexa  15 mg and Hydroxyzine  50 mg TID PRN for anxiety.   ## Medical Decision Making Capacity: Not specifically  addressed in this encounter   ## Further Work-up:  -- Defer to EDP -- most recent EKG on 05/21/24 had QtC of 426 -- Pertinent labwork reviewed earlier this admission includes: CBC, CMP, UDS neg, ethyl alcohol     ## Disposition:-- We recommend inpatient psychiatric hospitalization. Patient is under voluntary admission status at this time; please IVC if attempts to leave hospital.   ## Behavioral / Environmental: - No specific recommendations at this time.                 ## Safety and Observation Level:  - Based on my clinical evaluation, I estimate the patient to be at low risk of self harm in the current setting. - At this time, we recommend  routine. This decision is based on my review of the chart including patient's history and current presentation, interview of the patient, mental status examination, and consideration of suicide risk including evaluating suicidal ideation, plan, intent, suicidal or self-harm behaviors, risk factors, and protective factors. This judgment is based on our ability to directly address suicide risk, implement suicide prevention strategies, and develop a safety plan while the patient is in the clinical setting. Please contact our team if there is a concern that risk level has changed.   CSSR Risk Category:C-SSRS RISK CATEGORY: High Risk   Suicide Risk Assessment: Patient has following modifiable risk factors for suicide: active suicidal ideation, which we are addressing by inpatient psychiatry. Patient has following non-modifiable or demographic risk factors for suicide: male gender Patient has the following protective factors against  suicide: Supportive family   Thank you for this consult request. Recommendations have been communicated to the primary team.  We will recommend inpatient psychiatry once medically cleared at this time.   Jeremiah Newman, PMHNP       History of Present Illness  Relevant Aspects of Hospital ED Course:  Admitted on  05/21/2024 for SI w/ plan to jump in traffic, hearing voices telling him to kill himself, and panic attacks x months.  Patient Report:  Jeremiah Newman, 33 y.o., male patient seen face to face by this provider, consulted with Jeremiah Newman; and chart reviewed on 05/21/24.  On evaluation Jeremiah Newman reports that he used to feel like he has been on a roller coaster.  States that his emotions and moods have been all over the place, states that since he has been released from prison in October 2024 that he has been having a lot of anxiety/panic attack, where he is feeling impending doom and that he is going to die.  He states that he hears voices and they are telling him to kill himself.  He states that he was just discharged from [facility], and was sent to a sober living home, states that he was told before he went to a sober living home with a new of his psychiatric medications, when he arrived at home that he did not have any of his psychiatric medication.  He states that he currently has not been on his medications for about 2 days.  He currently denies HI/VH, endorses for SI w/ plan to jump in traffic, hearing voices telling him to kill himself, and panic attacks x months., with no plan and auditory hallucinations.  He states due to the voices he has not been able to eat or sleep and rates his appetite and sleep as poor. Patient currently denies using any illicit substance or alcohol, UDS and BAL are negative.  Patient states that he takes Klonopin, Risperdal, prazosin, Remeron , and Subutex (states he was prescribed this medication through [facility] in [place]).   During evaluation Jeremiah Newman is sitting up in his hospital bed and appears to be in moderate distress. He is alert, oriented x 4, calm, cooperative and attentive. His mood is sad with congruent affect.  At times patient appears to be having some thought blocking going on, and does appear to be responding to  internal stimuli.  Patient states that he had a job, working at [employer] and enjoyed it working for 10 hours, but states that due to not being on medication he is unable to work at this time.  It is recommended for inpatient admission, for medication adjustment and monitoring, and safety.    Psych ROS:  Depression: Endorses Anxiety: Endorses Mania (lifetime and current): Yes Psychosis: (lifetime and current): Yes  Collateral information:  Contacted None   Review of Systems  Psychiatric/Behavioral:  Positive for depression, hallucinations and suicidal ideas.      Psychiatric and Social History  Psychiatric History:  Information collected from patient, patient's mother Abe Abed, ED treatment team   Prev Dx/Sx: Alcohol abuse, cocaine abuse, PTSD, schizoaffective disorder, bipolar type Current Psych Provider: Yes Home Meds (current): Per his mother, Zyprexa  15 mg and hydroxyzine  50 mg Previous Med Trials: Per his record, lithium  Therapy: Yes   Prior Psych Hospitalization: Yes Prior Self Harm: Yes Prior Violence: Denies   Family Psych History: unknown Family Hx suicide: sister inhospital suciicde attempt   Social History:  Developmental  Hx: Patient unable to identify Educational Hx: Patient unable to identify Occupational Hx: Patient unable to identify Legal Hx: Patient unable to identify Living Situation: With mother and father Spiritual Hx: Patient unable to identify Access to weapons/lethal means: There's guns in the house   Substance History Alcohol: yes Type of alcohol liqour Last Drink yesterday Number of drinks per day 1 shot History of alcohol withdrawal seizures denies History of DT's denies Tobacco: yes. 1/2 pack per day Illicit drugs: denies. In the past Prescription drug abuse: denies Rehab hx: yes. I dont' remember where  Exam Findings  Physical Exam:  Vital Signs:  Temp:  [98.2 F (36.8 C)] 98.2 F (36.8 C) (06/19 0725) Pulse Rate:   [78] 78 (06/19 0725) Resp:  [16] 16 (06/19 0725) BP: (121)/(71) 121/71 (06/19 0725) SpO2:  [99 %] 99 % (06/19 0725) Weight:  [98.4 kg] 98.4 kg (06/19 0745) Blood pressure 121/71, pulse 78, temperature 98.2 F (36.8 C), temperature source Oral, resp. rate 16, height 5' 7 (1.702 m), weight 98.4 kg, SpO2 99%. Body mass index is 33.99 kg/m.  Physical Exam Vitals and nursing note reviewed.   Neurological:     Mental Status: He is alert.   Psychiatric:        Attention and Perception: He is inattentive.        Mood and Affect: Mood is anxious.        Speech: Speech normal.        Behavior: Behavior is cooperative.        Thought Content: Thought content is paranoid.        Cognition and Memory: Cognition is impaired.        Judgment: Judgment is inappropriate.      Mental Status Exam: General Appearance: Disheveled  Orientation:  Full (Time, Place, and Person)  Memory:  Immediate;   Good Recent;   Fair Remote;   Fair  Concentration:  Concentration: Good and Attention Span: Good  Recall:  Fair  Attention  Good  Eye Contact:  Good  Speech:  Clear and Coherent  Language:  Good  Volume:  Normal  Mood: Depressed  Affect:  Flat  Thought Process:  Coherent  Thought Content:  Logical  Suicidal Thoughts:  Yes.  with intent/plan  Homicidal Thoughts:  No  Judgement:  Good  Insight:  Good  Psychomotor Activity:  Normal  Akathisia:  No  Fund of Knowledge:  Good       Assets:  Housing Social Support  Cognition:  WNL  ADL's:  Intact  AIMS (if indicated):        Other History   These have been pulled in through the EMR, reviewed, and updated if appropriate.  Family History:  The patient's family history is not on file.  Medical History: Past Medical History:  Diagnosis Date   Schizophrenia [facility])     Surgical History: History reviewed. No pertinent surgical history.   Medications:   Current Facility-Administered Medications:    alum & mag hydroxide-simeth  (MAALOX/MYLANTA) 200-200-20 MG/5ML suspension 30 mL, 30 mL, Oral, Q6H PRN, Darlis Eisenmenger, PA-C   ibuprofen  (ADVIL ) tablet 600 mg, 600 mg, Oral, Q8H PRN, Editha Goring A, PA-C   lithium  carbonate (LITHOBID ) ER tablet 300 mg, 300 mg, Oral, Q12H, [name], Alvah Lagrow A, PMHNP   melatonin tablet 3 mg, 3 mg, Oral, QHS, Editha Goring A, PA-C   mirtazapine  (REMERON ) tablet 7.5 mg, 7.5 mg, Oral, QHS, [name], Jaretzy Lhommedieu A, PMHNP   ondansetron  (ZOFRAN ) tablet 4 mg,  4 mg, Oral, Q8H PRN, Darlis Eisenmenger, PA-C   prazosin (MINIPRESS) capsule 2 mg, 2 mg, Oral, QHS, [name], Evian Salguero A, PMHNP   risperiDONE (RISPERDAL) tablet 2 mg, 2 mg, Oral, QHS, [name], Dontrelle Mazon A, PMHNP   traZODone  (DESYREL ) tablet 200 mg, 200 mg, Oral, QHS PRN, [name], Roe Koffman A, PMHNP  Current Outpatient Medications:    buprenorphine (SUBUTEX) 8 MG SUBL SL tablet, Place 16 mg under the tongue daily., Disp: , Rfl:    lithium  carbonate (LITHOBID ) 300 MG ER tablet, Take 1 tablet (300 mg total) by mouth every 12 (twelve) hours. (Patient taking differently: Take 300 mg by mouth at bedtime.), Disp: 60 tablet, Rfl: 0   mirtazapine  (REMERON ) 7.5 MG tablet, Take 1 tablet (7.5 mg total) by mouth at bedtime., Disp: 30 tablet, Rfl: 0   paliperidone  (INVEGA  SUSTENNA) 234 MG/1.5ML injection, Inject 156 mg into the muscle once for 1 dose., Disp: 1 mL, Rfl: 0   paliperidone  (INVEGA ) 1.5 MG 24 hr tablet, Take 1 tablet (1.5 mg total) by mouth daily for 7 days., Disp: 7 tablet, Rfl: 0   prazosin (MINIPRESS) 2 MG capsule, Take 2 mg by mouth at bedtime., Disp: , Rfl:    risperiDONE (RISPERDAL) 2 MG tablet, Take 2 mg by mouth at bedtime., Disp: , Rfl:    traZODone  (DESYREL ) 100 MG tablet, Take 2 tablets (200 mg total) by mouth at bedtime as needed for up to 28 days for sleep., Disp: 28 tablet, Rfl: 0   Vitamin D, Ergocalciferol, (DRISDOL) 1.25 MG (50000 UNIT) CAPS capsule, Take 50,000 Units by mouth every 7 (seven) days., Disp: , Rfl:    Allergies: No Known Allergies  Reya Aurich [name], PMHNP

## 2024-05-21 NOTE — ED Triage Notes (Signed)
 Pt c/o SI w/ plan to jump in traffic, hearing voices telling him to kill himself, and panic attacks x months.  Pt reports he was just discharged from a [facility] hospital at [facility] and has been living in a sober living house x1 day, but they told him that he can't take his prescribed medications.  Pt reports he never got his medications filled.   Pt reports SI attempt x1 year ago.    Denies drug and ETOH use.

## 2024-05-21 NOTE — Progress Notes (Signed)
 Jeremiah Newman did not attend wrap up group.

## 2024-05-21 NOTE — Progress Notes (Signed)
 Admission Note: Patient is a 33 years old male admitted to the unit voluntarily from [facility] for suicidal ideation with a plan to jump in traffic.  Reports voices telling him to kill himself.  Patient is alert and oriented x 4.  Presents with anxious affect and depressed mood.  Stated he is here to get into a good mental health program and to get his medications right.  Admission plan of care reviewed, consent signed.  Skin and personal belonging search completed.  Skin is dry and intact.  Items deemed contraband placed in the locker.  Patient oriented to the unit, staff and room.  Routine safety checks initiated.  Verbalizes understanding of unit rules/protocols.  Patient is safe on the unit.

## 2024-05-21 NOTE — Group Note (Signed)
 LCSW Group Therapy Note   Group Date: 05/21/2024 Start Time: 1100 End Time: 1200   Participation:  patient was admitted after the group session was held  Type of Therapy:  Group Therapy   Topic:  Healing From Within: Understanding Our Past, Building Our Future"  Objective:  To help participants understand the impact of early experiences on mental and physical health, with a focus on Adverse Childhood Experiences (ACEs), and to explore ways to build resilience and healing.  Group Goals: Understand ACEs and Their Impact: Learn how childhood experiences shape mental and physical health. Build Resilience: Develop strategies for overcoming challenges and creating positive change. Promote Healing: Recognize the value of support and the possibility of healing through therapy and self-care.  Summary: In today's session, we discussed how early experiences, especially ACEs, impact mental and physical health. We explored the effects of stress, abuse, and neglect on brain development and well-being. The group focused on resilience, understanding that healing and positive change are possible with support and self-awareness.  Therapeutic Modalities Used: Psychoeducation: Sharing information about ACEs and their effects. Cognitive Behavioral Therapy (CBT): Helping reframe negative thought patterns. Trauma-Informed Therapy: Creating a safe, supportive space for healing.   Maurio Baize O Radford Pease, LCSWA 05/21/2024  7:35 PM

## 2024-05-21 NOTE — Progress Notes (Signed)
 Pt has been accepted to [facility] on 05/21/2024 Bed assignment: 404-1  Pt meets inpatient criteria per: Chandra Come, PMHNP   Attending Physician will be: Dr. [name] MD   Report can be called to: unit:Adult unit: [phone]  Pt can arrive after (pending items are received/pending discharges)  Care Team Notified: [facility] Bevin Bucks RN, Patt Boozer [facility], [place], PMHNP   [place] Harshan Kearley LCSW-A   05/21/2024 1:34 PM

## 2024-05-21 NOTE — ED Provider Notes (Signed)
 [facility] [place] EMERGENCY DEPARTMENT AT [facility] Provider Note   CSN: [id number] Arrival date & time: 05/21/24  0720     Patient presents with: Suicidal and Panic Attack   Jeremiah Newman is a 33 y.o. male.   33 year old male presents the emergency room with complaint of suicidal ideation and auditory hallucinations, panic attacks.  Patient states that he was discharged from [facility] yesterday and sent to sober living center in [place] however the medications that he takes for his hallucinations and nightmares is not allowed to be provided by the sober living facility so he came to the emergency room.  He does report drinking 1 beer last night.  States that he has some itching and a little bit of nausea otherwise no other complaints or concerns today. Notes he was released from prison in October.  His parents live in [place] but he is unable to stay with him due to his behavior at night.  Notes he has been in and out of psychiatric facilities since released from present.       Prior to Admission medications   Medication Sig Start Date End Date Taking? Authorizing Provider  buprenorphine (SUBUTEX) 8 MG SUBL SL tablet Place 16 mg under the tongue daily.    [provider]  lithium  carbonate (LITHOBID ) 300 MG ER tablet Take 1 tablet (300 mg total) by mouth every 12 (twelve) hours. Patient taking differently: Take 300 mg by mouth at bedtime. 02/07/24 05/21/24  Rosalene Colon, NP  mirtazapine  (REMERON ) 7.5 MG tablet Take 1 tablet (7.5 mg total) by mouth at bedtime. 02/07/24 03/08/24  Rosalene Colon, NP  paliperidone  (INVEGA  SUSTENNA) 234 MG/1.5ML injection Inject 156 mg into the muscle once for 1 dose. 02/14/24 02/14/24  Rosalene Colon, NP  paliperidone  (INVEGA ) 1.5 MG 24 hr tablet Take 1 tablet (1.5 mg total) by mouth daily for 7 days. 02/08/24 05/21/24  Rosalene Colon, NP  prazosin (MINIPRESS) 2 MG capsule Take 2 mg by mouth at bedtime.    [provider]  risperiDONE  (RISPERDAL) 2 MG tablet Take 2 mg by mouth at bedtime.    [provider]  traZODone  (DESYREL ) 100 MG tablet Take 2 tablets (200 mg total) by mouth at bedtime as needed for up to 28 days for sleep. 02/07/24 03/06/24  Rosalene Colon, NP  Vitamin D, Ergocalciferol, (DRISDOL) 1.25 MG (50000 UNIT) CAPS capsule Take 50,000 Units by mouth every 7 (seven) days.    [provider]    Allergies: Patient has no known allergies.    Review of Systems Negative except as per HPI Updated Vital Signs BP 121/71 (BP Location: Right Arm)   Pulse 78   Temp 98.2 F (36.8 C) (Oral)   Resp 16   Ht 5' 7 (1.702 m)   Wt 98.4 kg   SpO2 99%   BMI 33.99 kg/m   Physical Exam Vitals and nursing note reviewed.  Constitutional:      General: He is not in acute distress.    Appearance: He is well-developed. He is not diaphoretic.  HENT:     Head: Normocephalic and atraumatic.   Cardiovascular:     Rate and Rhythm: Normal rate and regular rhythm.     Heart sounds: Normal heart sounds. No murmur heard. Pulmonary:     Effort: Pulmonary effort is normal.     Breath sounds: Normal breath sounds.   Skin:    General: Skin is warm and dry.   Neurological:  Mental Status: He is alert and oriented to person, place, and time.   Psychiatric:        Mood and Affect: Mood normal.        Speech: Speech normal.        Behavior: Behavior normal. Behavior is not agitated, slowed, aggressive, withdrawn, hyperactive or combative.        Thought Content: Thought content is not paranoid. Thought content includes suicidal ideation. Thought content does not include homicidal ideation. Thought content includes suicidal plan.     (all labs ordered are listed, but only abnormal results are displayed) Labs Reviewed  COMPREHENSIVE METABOLIC PANEL WITH GFR - Abnormal; Notable for the following components:      Result Value   Sodium 134 (*)    Potassium 3.1 (*)    Glucose, Bld 104 (*)    All other  components within normal limits  CBC - Abnormal; Notable for the following components:   MCV 79.9 (*)    All other components within normal limits  LITHIUM  LEVEL - Abnormal; Notable for the following components:   Lithium  Lvl 0.07 (*)    All other components within normal limits  ETHANOL  RAPID URINE DRUG SCREEN, HOSP PERFORMED    EKG: None  Radiology: No results found.   Procedures   Medications Ordered in the ED  ibuprofen  (ADVIL ) tablet 600 mg (has no administration in time range)  ondansetron  (ZOFRAN ) tablet 4 mg (has no administration in time range)  alum & mag hydroxide-simeth (MAALOX/MYLANTA) 200-200-20 MG/5ML suspension 30 mL (has no administration in time range)  melatonin tablet 3 mg (has no administration in time range)  hydrOXYzine  (ATARAX ) tablet 25 mg (25 mg Oral Given 05/21/24 0917)  ondansetron  (ZOFRAN ) tablet 4 mg (4 mg Oral Given 05/21/24 0917)  potassium chloride SA (KLOR-CON M) CR tablet 40 mEq (40 mEq Oral Given 05/21/24 1227)                                    Medical Decision Making Amount and/or Complexity of Data Reviewed Labs: ordered.  Risk OTC drugs. Prescription drug management.   This patient presents to the ED for concern of SI, this involves an extensive number of treatment options, and is a complaint that carries with it a high risk of complications and morbidity.  The differential diagnosis includes psychosis, malingering    Co morbidities / Chronic conditions that complicate the patient evaluation  Schizophrenia   Additional history obtained:  Additional history obtained from EMR External records from outside source obtained and reviewed including medical records on file including recent admission to [facility] for behavioral health   Lab Tests:  I Ordered, and personally interpreted labs.  The pertinent results include: Mild hypokalemia with potassium of 3.1.  CBC without significant findings.  Lithium  level is subtherapeutic.   Alcohol is negative.  UDS is negative.   Problem List / ED Course / Critical interventions / Medication management  33 year old male presents with auditory hallucinations and suicidal ideation with plan as above.  Recently discharged from [facility] where he was stabilized and transferred to sober living arrangements in [place] however at the facility does not allow him to take the medications he is prescribed per patient.  Multiple behavioral health admissions since released from prison in October with no permanent living arrangements as he states he is unable to live with his parents due to his nighttime behaviors.  Patient  is medically cleared and pending behavioral health evaluation. I have reviewed the patients home medicines and have made adjustments as needed   Consultations Obtained:  I requested consultation with the behavioral health team,  and discussed lab and imaging findings as well as pertinent plan - they recommend: Evaluation pending at change of shift.   Social Determinants of Health:  No PCP   Test / Admission - Considered:  Medically cleared for behavioral health evaluation and disposition      Final diagnoses:  Psychosis, unspecified psychosis type [facility])    ED Discharge Orders     None          Darlis Eisenmenger, PA-C 05/21/24 1250    Nicklas Barns, MD 05/21/24 1346

## 2024-05-22 ENCOUNTER — Encounter (HOSPITAL_COMMUNITY): Payer: Self-pay

## 2024-05-22 DIAGNOSIS — F250 Schizoaffective disorder, bipolar type: Secondary | ICD-10-CM | POA: Diagnosis not present

## 2024-05-22 MED ORDER — HYDROXYZINE HCL 50 MG PO TABS
50.0000 mg | ORAL_TABLET | Freq: Four times a day (QID) | ORAL | Status: DC | PRN
  Administered 2024-05-22 – 2024-05-31 (×7): 50 mg via ORAL
  Filled 2024-05-22 (×7): qty 1

## 2024-05-22 MED ORDER — PRAZOSIN HCL 1 MG PO CAPS: 2.0000 mg | ORAL_CAPSULE | Freq: Every day | ORAL | Status: DC

## 2024-05-22 MED ORDER — BUPRENORPHINE HCL-NALOXONE HCL 2-0.5 MG SL SUBL
2.0000 | SUBLINGUAL_TABLET | Freq: Once | SUBLINGUAL | Status: AC
  Administered 2024-05-22: 2 via SUBLINGUAL
  Filled 2024-05-22: qty 2

## 2024-05-22 MED ORDER — MIRTAZAPINE 15 MG PO TABS
15.0000 mg | ORAL_TABLET | Freq: Every day | ORAL | Status: DC
  Administered 2024-05-22 – 2024-05-29 (×8): 15 mg via ORAL
  Filled 2024-05-22 (×8): qty 1

## 2024-05-22 MED ORDER — DOCUSATE SODIUM 100 MG PO CAPS
100.0000 mg | ORAL_CAPSULE | Freq: Every day | ORAL | Status: DC
  Administered 2024-05-22 – 2024-05-24 (×3): 100 mg via ORAL
  Filled 2024-05-22 (×3): qty 1

## 2024-05-22 NOTE — Progress Notes (Signed)
 Arlin did not attend AA wrap up group

## 2024-05-22 NOTE — Plan of Care (Addendum)
 Pt A & O X3. Denies SI, HI, AVH and pain when assessed. Presents with blunted affect, irritable on initial encounter but brightens up as shift progressed. Reports he slept horrible last night. I'm detoxing, I need my sebutex. Rates his anxiety 9/10, depression 7/10 and anger 3/10 I just need my sebutex, I'm withdrawing. Assigned provider made aware, pt started on his sebutex as ordered. Pt did not attend scheduled groups, regardless of multiple prompts. Off unit for meals, courtyard, returned without issues. Safety checks maintained at Q 15 minutes intervals without incident.  Problem: Coping: Goal: Ability to demonstrate self-control will improve Outcome: Progressing   Problem: Medication: Goal: Compliance with prescribed medication regimen will improve Outcome: Progressing   Problem: Activity: Goal: Interest or engagement in activities will improve Outcome: Progressing

## 2024-05-22 NOTE — Group Note (Signed)
 Date:  05/22/2024 Time:  11:21 AM  Group Topic/Focus:  Goals Group:   The focus of this group is to help patients establish daily goals to achieve during treatment and discuss how the patient can incorporate goal setting into their daily lives to aide in recovery.    Participation Level:  Did Not Attend  Participation Quality:    Affect:    Cognitive:    Insight:   Engagement in Group:    Modes of Intervention:    Additional Comments:  Pt were aware of group time. PT refused to attend group.  Tita Form 05/22/2024, 11:21 AM

## 2024-05-22 NOTE — Plan of Care (Signed)
   Problem: Education: Goal: Knowledge of [facility] Education information/materials will improve Outcome: Progressing Goal: Emotional status will improve Outcome: Progressing Goal: Mental status will improve Outcome: Progressing

## 2024-05-22 NOTE — Group Note (Signed)
 Recreation Therapy Group Note   Group Topic:Team Building  Group Date: 05/22/2024 Start Time: 0935 End Time: 1005 Facilitators: Dasie Chancellor-McCall, LRT,CTRS Location: [address] Dayroom   Group Topic: Communication, Team Building, Problem Solving  Goal Area(s) Addresses:  Patient will effectively work with peer towards shared goal.  Patient will identify skills used to make activity successful.  Patient will identify how skills used during activity can be applied to reach post d/c goals.   Behavioral Response:   Intervention: STEM Activity- Glass blower/designer  Activity: Tallest [employer]. In teams of 5-6, patients were given 11 craft pipe cleaners. Using the materials provided, patients were instructed to compete again the opposing team(s) to build the tallest free-standing structure from floor level. The activity was timed; difficulty increased by Clinical research associate as Production designer, theatre/television/film continued.  Systematically resources were removed with additional directions for example, placing one arm behind their back, working in silence, and shape stipulations. LRT facilitated post-activity discussion reviewing team processes and necessary communication skills involved in completion. Patients were encouraged to reflect how the skills utilized, or not utilized, in this activity can be incorporated to positively impact support systems post discharge.  Education: Pharmacist, community, Scientist, physiological, Discharge Planning   Education Outcome: Acknowledges education/In group clarification offered/Needs additional education.    Affect/Mood: N/A   Participation Level: Did not attend    Clinical Observations/Individualized Feedback:     Plan: Continue to engage patient in RT group sessions 2-3x/week.   Sherif Millspaugh-McCall, LRT,CTRS  05/22/2024 12:48 PM

## 2024-05-22 NOTE — [facility] Suicide Risk Assessment (Signed)
 [facility] INPATIENT:  Family/Significant Other Suicide Prevention Education  Suicide Prevention Education:  Patient Refusal for Family/Significant Other Suicide Prevention Education: The patient Jeremiah Newman has refused to provide written consent for family/significant other to be provided Family/Significant Other Suicide Prevention Education during admission and/or prior to discharge.  Physician notified.  Vonzell Guerin 05/22/2024, 11:15 AM

## 2024-05-22 NOTE — Progress Notes (Signed)
  Jeremiah Newman  Type of Note: Refusal for follow up appointments  Patient declined appointments with mental health providers at discharge including psychiatrist and therapist. Resources will be added to follow up if patient decides otherwise after discharge.  Will update if needed during hospitalization.   Signed:  Davante Gerke, LCSW-A 05/22/2024  11:17 AM

## 2024-05-22 NOTE — [facility] IP Treatment Plan (Signed)
 Interdisciplinary Treatment and Diagnostic Plan Update  05/22/2024 Time of Session: 10:10 AM Jeremiah Newman MRN: [id number]  Principal Diagnosis: Schizoaffective disorder, bipolar type (HCC)  Secondary Diagnoses: Principal Problem:   Schizoaffective disorder, bipolar type (HCC)   Current Medications:  Current Facility-Administered Medications  Medication Dose Route Frequency Provider Last Rate Last Admin   acetaminophen  (TYLENOL ) tablet 650 mg  650 mg Oral Q6H PRN [name], [name], PMHNP       alum & mag hydroxide-simeth (MAALOX/MYLANTA) 200-200-20 MG/5ML suspension 30 mL  30 mL Oral Q4H PRN [name], [name], PMHNP       buprenorphine-naloxone (SUBOXONE) 2-0.5 mg per SL tablet 2 tablet  2 tablet Sublingual Once [name], [name], MD       haloperidol  (HALDOL ) tablet 5 mg  5 mg Oral TID PRN [name], [name], PMHNP       And   diphenhydrAMINE  (BENADRYL ) capsule 50 mg  50 mg Oral TID PRN [name], [name], PMHNP       haloperidol  lactate (HALDOL ) injection 5 mg  5 mg Intramuscular TID PRN [name], [name], PMHNP       And   diphenhydrAMINE  (BENADRYL ) injection 50 mg  50 mg Intramuscular TID PRN [name], [name], PMHNP       And   LORazepam  (ATIVAN ) injection 2 mg  2 mg Intramuscular TID PRN [name], [name], PMHNP       haloperidol  lactate (HALDOL ) injection 10 mg  10 mg Intramuscular TID PRN [name], [name], PMHNP       And   diphenhydrAMINE  (BENADRYL ) injection 50 mg  50 mg Intramuscular TID PRN [name], [name], PMHNP       And   LORazepam  (ATIVAN ) injection 2 mg  2 mg Intramuscular TID PRN [name], [name], PMHNP       docusate sodium (COLACE) capsule 100 mg  100 mg Oral Daily [name], [name], MD       ibuprofen  (ADVIL ) tablet 600 mg  600 mg Oral Q8H PRN [name], [name], PMHNP       lithium  carbonate (LITHOBID ) ER tablet 300 mg  300 mg Oral Q12H [name], [name], PMHNP   300 mg at 05/22/24  1308   magnesium  hydroxide (MILK OF MAGNESIA) suspension 30 mL  30 mL Oral Daily PRN [name], [name], PMHNP       melatonin tablet 3 mg  3 mg Oral QHS [name], [name], PMHNP   3 mg at 05/21/24 2141   mirtazapine  (REMERON ) tablet 15 mg  15 mg Oral QHS [name], [name], MD       risperiDONE (RISPERDAL) tablet 2 mg  2 mg Oral QHS [name], [name], PMHNP   2 mg at 05/21/24 2141   traZODone  (DESYREL ) tablet 200 mg  200 mg Oral QHS PRN [name], [name], PMHNP       PTA Medications: Medications Prior to Admission  Medication Sig Dispense Refill Last Dose/Taking   buprenorphine (SUBUTEX) 8 MG SUBL SL tablet Place 16 mg under the tongue daily.      lithium  carbonate (LITHOBID ) 300 MG ER tablet Take 1 tablet (300 mg total) by mouth every 12 (twelve) hours. (Patient taking differently: Take 300 mg by mouth at bedtime.) 60 tablet 0    mirtazapine  (REMERON ) 7.5 MG tablet Take 1 tablet (7.5 mg total) by mouth at bedtime. 30 tablet 0    paliperidone  (INVEGA  SUSTENNA) 234 MG/1.5ML injection Inject 156 mg into the muscle once for 1 dose. 1 mL 0  paliperidone  (INVEGA ) 1.5 MG 24 hr tablet Take 1 tablet (1.5 mg total) by mouth daily for 7 days. 7 tablet 0    prazosin (MINIPRESS) 2 MG capsule Take 2 mg by mouth at bedtime.      risperiDONE (RISPERDAL) 2 MG tablet Take 2 mg by mouth at bedtime.      traZODone  (DESYREL ) 100 MG tablet Take 2 tablets (200 mg total) by mouth at bedtime as needed for up to 28 days for sleep. 28 tablet 0    Vitamin D, Ergocalciferol, (DRISDOL) 1.25 MG (50000 UNIT) CAPS capsule Take 50,000 Units by mouth every 7 (seven) days.       Patient Stressors: Financial difficulties   Medication change or noncompliance   Substance abuse    Patient Strengths: Ability for Contractor for treatment/growth   Treatment Modalities: Medication Management, Group therapy, Case management,  1 to 1 session with clinician, Psychoeducation,  Recreational therapy.   Physician Treatment Plan for Primary Diagnosis: Schizoaffective disorder, bipolar type (HCC) Long Term Goal(s):     Short Term Goals:    Medication Management: Evaluate patient's response, side effects, and tolerance of medication regimen.  Therapeutic Interventions: 1 to 1 sessions, Unit Group sessions and Medication administration.  Evaluation of Outcomes: Not Progressing  Physician Treatment Plan for Secondary Diagnosis: Principal Problem:   Schizoaffective disorder, bipolar type (HCC)  Long Term Goal(s):     Short Term Goals:       Medication Management: Evaluate patient's response, side effects, and tolerance of medication regimen.  Therapeutic Interventions: 1 to 1 sessions, Unit Group sessions and Medication administration.  Evaluation of Outcomes: Not Progressing   RN Treatment Plan for Primary Diagnosis: Schizoaffective disorder, bipolar type (HCC) Long Term Goal(s): Knowledge of disease and therapeutic regimen to maintain health will improve  Short Term Goals: Ability to remain free from injury will improve, Ability to verbalize frustration and anger appropriately will improve, Ability to demonstrate self-control, Ability to participate in decision making will improve, Ability to verbalize feelings will improve, Ability to disclose and discuss suicidal ideas, Ability to identify and develop effective coping behaviors will improve, and Compliance with prescribed medications will improve  Medication Management: RN will administer medications as ordered by provider, will assess and evaluate patient's response and provide education to patient for prescribed medication. RN will report any adverse and/or side effects to prescribing provider.  Therapeutic Interventions: 1 on 1 counseling sessions, Psychoeducation, Medication administration, Evaluate responses to treatment, Monitor vital signs and CBGs as ordered, Perform/monitor CIWA, COWS, AIMS and Fall  Risk screenings as ordered, Perform wound care treatments as ordered.  Evaluation of Outcomes: Not Progressing   LCSW Treatment Plan for Primary Diagnosis: Schizoaffective disorder, bipolar type (HCC) Long Term Goal(s): Safe transition to appropriate next level of care at discharge, Engage patient in therapeutic group addressing interpersonal concerns.  Short Term Goals: Engage patient in aftercare planning with referrals and resources, Increase social support, Increase ability to appropriately verbalize feelings, Increase emotional regulation, Facilitate acceptance of mental health diagnosis and concerns, Facilitate patient progression through stages of change regarding substance use diagnoses and concerns, Identify triggers associated with mental health/substance abuse issues, and Increase skills for wellness and recovery  Therapeutic Interventions: Assess for all discharge needs, 1 to 1 time with Social worker, Explore available resources and support systems, Assess for adequacy in community support network, Educate family and significant other(s) on suicide prevention, Complete Psychosocial Assessment, Interpersonal group therapy.  Evaluation of Outcomes: Not Progressing  Progress in Treatment: Attending groups: attended some groups Participating in groups: Yes. Taking medication as prescribed: Yes. Toleration medication: Yes. Family/Significant other contact made: patient declined consents Patient understands diagnosis: Yes. Discussing patient identified problems/goals with staff: Yes. Medical problems stabilized or resolved: Yes. Denies suicidal/homicidal ideation: Yes. Issues/concerns per patient self-inventory: No.  New problem(s) identified:  No  New Short Term/Long Term Goal(s):    medication stabilization, elimination of SI thoughts, development of comprehensive mental wellness plan.   Patient Goals:  I want to get my medications right.   Discharge Plan or Barriers:   Patient recently admitted. CSW will continue to follow and assess for appropriate referrals and possible discharge planning.   Reason for Continuation of Hospitalization: Hallucinations Medication stabilization Suicidal ideation  Estimated Length of Stay:  5 - 7 days  Last 3 [place] Suicide Severity Risk Score: Flowsheet Row [facility] Most recent reading at 05/21/2024  6:18 PM ED from 05/21/2024 in [facility] Emergency Department [facility] Most recent reading at 05/21/2024  7:46 AM Admission (Discharged) from 01/30/2024 in [facility] Most recent reading at 01/30/2024  9:17 PM  C-SSRS RISK CATEGORY Error: Q7 should not be populated when Q6 is No High Risk Error: Q7 should not be populated when Q6 is No    Last PHQ 2/9 Scores:     No data to display          Scribe for Treatment Team: Aviana Shevlin O Deval Mroczka, LCSWA 05/22/2024 5:47 PM

## 2024-05-22 NOTE — Plan of Care (Signed)
  Problem: Coping: Goal: Ability to demonstrate self-control will improve Outcome: Progressing   Problem: Medication: Goal: Compliance with prescribed medication regimen will improve Outcome: Progressing   

## 2024-05-22 NOTE — Group Note (Signed)
 Date:  05/22/2024 Time:  12:17 PM  Group Topic/Focus:  Wellness Toolbox:   The focus of this group is to discuss various aspects of wellness, balancing those aspects and exploring ways to increase the ability to experience wellness.  Patients will create a wellness toolbox for use upon discharge.    Participation Level:  Active  Participation Quality:  Appropriate  Affect:  Appropriate  Cognitive:  Appropriate  Insight: Appropriate  Engagement in Group:  Engaged  Modes of Intervention:  Education  Additional Comments:    Tita Form 05/22/2024, 12:17 PM

## 2024-05-22 NOTE — H&P (Signed)
 Psychiatric Admission Assessment Adult  Patient Identification: Jeremiah Newman MRN:  [id number] Date of Evaluation:  05/22/2024 Chief Complaint:  Schizoaffective disorder, bipolar type (HCC) [F25.0] Principal Diagnosis: Schizoaffective disorder, bipolar type (HCC) Diagnosis:  Principal Problem:   Schizoaffective disorder, bipolar type (HCC)  History of Present Illness:  33 year old Caucasian male with reported history of schizoaffective disorder, PTSD, polysubstance abuse presented to the emergency department yesterday with chief complaints of auditory hallucinations and suicidal ideations with plan to jump into traffic and AH telling him to kill himself.  PER psychiatric eval in ED yesterday on 6/19:  On evaluation Jeremiah Newman reports that he used to feel like he has been on a roller coaster.  States that his emotions and moods have been all over the place, states that since he has been released from prison in October 2024 that he has been having a lot of anxiety/panic attack, where he is feeling impending doom and that he is going to die.  He states that he hears voices and they are telling him to kill himself.  He states that he was just discharged from [facility], and was sent to a sober living home, states that he was told before he went to a sober living home with a new of his psychiatric medications, when he arrived at home that he did not have any of his psychiatric medication.  He states that he currently has not been on his medications for about 2 days.  He currently denies HI/VH, endorses for SI w/ plan to jump in traffic, hearing voices telling him to kill himself, and panic attacks x months., with no plan and auditory hallucinations.  He states due to the voices he has not been able to eat or sleep and rates his appetite and sleep as poor. Patient currently denies using any illicit substance or alcohol, UDS and BAL are negative.  Patient states that he  takes Klonopin, Risperdal, prazosin, Remeron , and Subutex (states he was prescribed this medication through [facility] in [place]).    Patient seen with social worker and tells me he was just admitted to [facility] in [place] and shows me his discharge paperwork. States he was discharged to [facility] but was unable to fill any of his psychotropic medications. Reprots worsening AH, insomnia and SI with plan since then for 2 days but states after Risperdal 2 mg at bedtime was restarted in ER along with lithium  patient is starting to feel better today. Denies SI, HI or AVH. Patient noted feeling slightly dizzy this morning and also states he has been withdrawing from Suboxone 16 mg qdaily which he was given a 7 day script of from [facility] but states he lost it and was unable to get it filled. Patient UDS negative. Patient states he has not used any drugs or drank alcohol since discharge and is able to go back to sober living of [place] once stable. Patient notes extensive opioid dependence history including oxycodone , hydrocodone  and Fentanyl . Patient states the Suboxone was really workign well for him and he would like to be referred to a MAT clinic and restart suboxone as notes withdrawing from it the last 2 days and strong opioid cravings. Discussed he is not to use benzodiazepines or alcohol with suboxone and need for random UDS due to risk for overdose. Patient acknowledged. Denies any alcohol or benzo withdrawal noting he was started on Klonopin taper and completed without issue at [facility] behaviors. Notes chills, sweats, bone aches,  insomnia, runny nose and would like to restart suboxone. Denies any relapse on opioids and I discussed induction with 8 mg dose today to which he was agreeable. States medications including lithium , mirtazapine , Risperdal 2 mg at bedtime otherwise work very well for him (states taking for 7 days at [employer]) and he would like to restart and have outpatient referral when stable.     Associated Signs/Symptoms: Depression Symptoms:  depressed mood, hopelessness, suicidal thoughts with specific plan, disturbed sleep, (Hypo) Manic Symptoms:  Hallucinations, Anxiety Symptoms:  appears anxious Psychotic Symptoms:  Hallucinations: Auditory Visual Paranoia, PTSD Symptoms: NA Total Time spent with patient: 45 minutes  Past Psychiatric History:  Diagnoses:  Alcohol abuse (patient states not since January and emphasized he is not to use alcohol or benzos with suboxone as well as will have random UDS), cocaine abuse, PTSD, schizoaffective disorder, bipolar type.  Multiple previous inpatient hospitalizations, most recent in 2/24 Not established with outpatient.  Multiple previous suicide attempts, 1.5 year ago via hanging while incarcerated.  Past trials of Remeron , Lexapro , Zyprexa , Lithium , hydroxyzine , Paxil, Prozac, Lamictal, Seroquel  (reports that there have been no meds that he has stabilized on) , Risperdal, mirtazapine   Is the patient at risk to self? Yes.    Has the patient been a risk to self in the past 6 months? Yes.    Has the patient been a risk to self within the distant past? Yes.    Is the patient a risk to others? Yes.    Has the patient been a risk to others in the past 6 months? No.  Has the patient been a risk to others within the distant past? No.   [place] Scale:  Flowsheet Row [facility] ([facility] Most recent reading at 05/21/2024  6:18 PM ED from 05/21/2024 in [facility] Department at [facility] Most recent reading at 05/21/2024  7:46 AM Admission (Discharged) from 01/30/2024 in [facility]ICINE Most recent reading at 01/30/2024  9:17 PM  C-SSRS RISK CATEGORY Error: Q7 should not be populated when Q6 is No High Risk Error: Q7 should not be populated when Q6 is No     Prior Inpatient Therapy: multiple, was last at [facility] January 2025, February  2025 Prior Outpatient Therapy: pt denies   Alcohol Screening: 1. How often do you have a drink containing alcohol?: Monthly or less 2. How many drinks containing alcohol do you have on a typical day when you are drinking?: 1 or 2 3. How often do you have six or more drinks on one occasion?: Less than monthly AUDIT-C Score: 2 Alcohol Brief Interventions/Follow-up: Patient Refused Substance Abuse History in the last 12 months:  Yes.   Consequences of Substance Abuse: Legal Consequences:  IVC, incarceration  Previous Psychotropic Medications: Yes  Psychological Evaluations: No  Past Medical History:  Past Medical History:  Diagnosis Date   Schizophrenia (HCC)    History reviewed. No pertinent surgical history. Family History: History reviewed. No pertinent family history. Family Psychiatric  History: sister - suicide per chart review Tobacco Screening:  Social History   Tobacco Use  Smoking Status Some Days   Current packs/day: 1.00   Average packs/day: 1 pack/day for 9.5 years (9.5 ttl pk-yrs)   Types: Cigarettes   Start date: 2016  Smokeless Tobacco Never    BH Tobacco Counseling     Are you interested in Tobacco Cessation Medications?  No, patient refused Counseled patient on smoking cessation:  N/A, patient does not use tobacco products Reason Tobacco Screening Not Completed: No value filed.       Social History:  Social History   Substance and Sexual Activity  Alcohol Use Not Currently     Social History   Substance and Sexual Activity  Drug Use No    Additional Social History: Marital status: Single Are you sexually active?: No What is your sexual orientation?: Heterosexual Has your sexual activity been affected by drugs, alcohol, medication, or emotional stress?: Patient denies. Does patient have children?: Yes How many children?: 1 How is patient's relationship with their children?: I haven't seen him and I don't know where he is or where his parents  are                         Allergies:  No Known Allergies Lab Results:  Results for orders placed or performed during the hospital encounter of 05/21/24 (from the past 48 hours)  Comprehensive metabolic panel     Status: Abnormal   Collection Time: 05/21/24  7:49 AM  Result Value Ref Range   Sodium 134 (L) 135 - 145 mmol/L   Potassium 3.1 (L) 3.5 - 5.1 mmol/L   Chloride 99 98 - 111 mmol/L   CO2 22 22 - 32 mmol/L   Glucose, Bld 104 (H) 70 - 99 mg/dL    Comment: Glucose reference range applies only to samples taken after fasting for at least 8 hours.   BUN 16 6 - 20 mg/dL   Creatinine, Ser 1.61 0.61 - 1.24 mg/dL   Calcium 9.2 8.9 - 09.6 mg/dL   Total Protein 7.9 6.5 - 8.1 g/dL   Albumin 4.6 3.5 - 5.0 g/dL   AST 21 15 - 41 U/L   ALT 15 0 - 44 U/L   Alkaline Phosphatase 61 38 - 126 U/L   Total Bilirubin 1.1 0.0 - 1.2 mg/dL   GFR, Estimated >04 >54 mL/min    Comment: (NOTE) Calculated using the CKD-EPI Creatinine Equation (2021)    Anion gap 13 5 - 15    Comment: Performed at [facility], [address] [place], [place] [address]  Ethanol     Status: None   Collection Time: 05/21/24  7:49 AM  Result Value Ref Range   Alcohol, Ethyl (B) <15 <15 mg/dL    Comment: (NOTE) For medical purposes only. Performed at [facility], [address] [place], [place] [address]   cbc     Status: Abnormal   Collection Time: 05/21/24  7:49 AM  Result Value Ref Range   WBC 10.3 4.0 - 10.5 K/uL   RBC 5.08 4.22 - 5.81 MIL/uL   Hemoglobin 14.2 13.0 - 17.0 g/dL   HCT 29.5 62.1 - 30.8 %   MCV 79.9 (L) 80.0 - 100.0 fL   MCH 28.0 26.0 - 34.0 pg   MCHC 35.0 30.0 - 36.0 g/dL   RDW 65.7 84.6 - 96.2 %   Platelets 283 150 - 400 K/uL   nRBC 0.0 0.0 - 0.2 %    Comment: Performed at [facility], [address] [place], [place] [address]  Rapid urine drug screen (hospital performed)     Status: None   Collection Time: 05/21/24   7:49 AM  Result Value Ref Range   Opiates NONE DETECTED NONE DETECTED   Cocaine NONE DETECTED NONE DETECTED   Benzodiazepines NONE DETECTED NONE DETECTED   Amphetamines NONE  DETECTED NONE DETECTED   Tetrahydrocannabinol NONE DETECTED NONE DETECTED   Barbiturates NONE DETECTED NONE DETECTED    Comment: (NOTE) DRUG SCREEN FOR MEDICAL PURPOSES ONLY.  IF CONFIRMATION IS NEEDED FOR ANY PURPOSE, NOTIFY LAB WITHIN 5 DAYS.  LOWEST DETECTABLE LIMITS FOR URINE DRUG SCREEN Drug Class                     Cutoff (ng/mL) Amphetamine and metabolites    1000 Barbiturate and metabolites    200 Benzodiazepine                 200 Opiates and metabolites        300 Cocaine and metabolites        300 THC                            50 Performed at [facility], [address] [place], [place] [address]   Lithium  level     Status: Abnormal   Collection Time: 05/21/24  7:51 AM  Result Value Ref Range   Lithium  Lvl 0.07 (L) 0.60 - 1.20 mmol/L    Comment: Performed at [facility], [address], [place], [place] [address]    Blood Alcohol level:  Lab Results  Component Value Date   [facility] <15 05/21/2024   ETH <10 01/30/2024    Metabolic Disorder Labs:  Lab Results  Component Value Date   HGBA1C 4.7 (L) 12/23/2023   MPG 88.19 12/23/2023   MPG 93.93 10/10/2023   No results found for: PROLACTIN Lab Results  Component Value Date   CHOL 215 (H) 12/23/2023   TRIG 297 (H) 12/23/2023   HDL 40 (L) 12/23/2023   CHOLHDL 5.4 12/23/2023   VLDL 59 (H) 12/23/2023   LDLCALC 116 (H) 12/23/2023   LDLCALC 146 (H) 10/10/2023    Current Medications: Current Facility-Administered Medications  Medication Dose Route Frequency Provider Last Rate Last Admin   acetaminophen  (TYLENOL ) tablet 650 mg  650 mg Oral Q6H PRN [name], [name], PMHNP       alum & mag hydroxide-simeth (MAALOX/MYLANTA) 200-200-20 MG/5ML suspension 30 mL  30 mL Oral Q4H PRN  [name], [name], PMHNP       buprenorphine-naloxone (SUBOXONE) 2-0.5 mg per SL tablet 2 tablet  2 tablet Sublingual Once Lorell Thibodaux, MD       haloperidol  (HALDOL ) tablet 5 mg  5 mg Oral TID PRN [name], [name], PMHNP       And   diphenhydrAMINE  (BENADRYL ) capsule 50 mg  50 mg Oral TID PRN [name], [name], PMHNP       haloperidol  lactate (HALDOL ) injection 5 mg  5 mg Intramuscular TID PRN [name], [name], PMHNP       And   diphenhydrAMINE  (BENADRYL ) injection 50 mg  50 mg Intramuscular TID PRN [name], [name], PMHNP       And   LORazepam  (ATIVAN ) injection 2 mg  2 mg Intramuscular TID PRN [name], [name], PMHNP       haloperidol  lactate (HALDOL ) injection 10 mg  10 mg Intramuscular TID PRN [name], [name], PMHNP       And   diphenhydrAMINE  (BENADRYL ) injection 50 mg  50 mg Intramuscular TID PRN [name], [name], PMHNP       And   LORazepam  (ATIVAN ) injection 2 mg  2 mg Intramuscular TID PRN [name], [name], PMHNP  docusate sodium (COLACE) capsule 100 mg  100 mg Oral Daily Jaylaa Gallion, MD       ibuprofen  (ADVIL ) tablet 600 mg  600 mg Oral Q8H PRN [name], [name], PMHNP       lithium  carbonate (LITHOBID ) ER tablet 300 mg  300 mg Oral Q12H [name], [name], PMHNP   300 mg at 05/22/24 0821   magnesium  hydroxide (MILK OF MAGNESIA) suspension 30 mL  30 mL Oral Daily PRN [name], [name], PMHNP       melatonin tablet 3 mg  3 mg Oral QHS [name], [name], PMHNP   3 mg at 05/21/24 2141   mirtazapine  (REMERON ) tablet 15 mg  15 mg Oral QHS Sharlot Sturkey, MD       risperiDONE (RISPERDAL) tablet 2 mg  2 mg Oral QHS [name], [name], PMHNP   2 mg at 05/21/24 2141   traZODone  (DESYREL ) tablet 200 mg  200 mg Oral QHS PRN [name], [name], PMHNP       PTA Medications: Medications Prior to Admission  Medication Sig Dispense Refill Last Dose/Taking   buprenorphine  (SUBUTEX) 8 MG SUBL SL tablet Place 16 mg under the tongue daily.      lithium  carbonate (LITHOBID ) 300 MG ER tablet Take 1 tablet (300 mg total) by mouth every 12 (twelve) hours. (Patient taking differently: Take 300 mg by mouth at bedtime.) 60 tablet 0    mirtazapine  (REMERON ) 7.5 MG tablet Take 1 tablet (7.5 mg total) by mouth at bedtime. 30 tablet 0    paliperidone  (INVEGA  SUSTENNA) 234 MG/1.5ML injection Inject 156 mg into the muscle once for 1 dose. 1 mL 0    paliperidone  (INVEGA ) 1.5 MG 24 hr tablet Take 1 tablet (1.5 mg total) by mouth daily for 7 days. 7 tablet 0    prazosin (MINIPRESS) 2 MG capsule Take 2 mg by mouth at bedtime.      risperiDONE (RISPERDAL) 2 MG tablet Take 2 mg by mouth at bedtime.      traZODone  (DESYREL ) 100 MG tablet Take 2 tablets (200 mg total) by mouth at bedtime as needed for up to 28 days for sleep. 28 tablet 0    Vitamin D, Ergocalciferol, (DRISDOL) 1.25 MG (50000 UNIT) CAPS capsule Take 50,000 Units by mouth every 7 (seven) days.       Musculoskeletal: Strength & Muscle Tone: within normal limits Gait & Station: normal Patient leans: N/A            Psychiatric Specialty Exam:  Presentation  General Appearance:  Appropriate for Environment  Eye Contact: Fair  Speech: Clear and Coherent  Speech Volume: Decreased  Handedness: Right   Mood and Affect  Mood: Depressed; Dysphoric; Anxious  Affect: Congruent   Thought Process  Thought Processes: Goal Directed  Duration of Psychotic Symptoms:reports chronic for years but resolved with Risperdal  Past Diagnosis of Schizophrenia or Psychoactive disorder: Yes  Descriptions of Associations:Intact  Orientation:Full (Time, Place and Person)  Thought Content:Logical  Hallucinations:Hallucinations: None  Ideas of Reference:None  Suicidal Thoughts:Suicidal Thoughts: No  Homicidal Thoughts:Homicidal Thoughts: No   Sensorium  Memory: Immediate  Fair  Judgment: Fair  Insight: Fair   Art therapist  Concentration: Fair  Attention Span: Fair  Recall: [employer] of Knowledge: Fair  Language: Fair   Psychomotor Activity  Psychomotor Activity: Psychomotor Activity: Normal   Assets  Assets: Communication Skills; Desire for Improvement; Housing; Leisure Time; Physical Health; Social Support   Sleep  Sleep: Sleep: Fair  Physical Exam: Physical Exam Vitals and nursing note reviewed.  HENT:     Head: Normocephalic and atraumatic.     Nose: Nose normal.     Mouth/Throat:     Mouth: Mucous membranes are moist.   Eyes:     Extraocular Movements: Extraocular movements intact.   Pulmonary:     Effort: Pulmonary effort is normal.   Musculoskeletal:        General: Normal range of motion.     Cervical back: Normal range of motion.   Skin:    General: Skin is warm and dry.   Neurological:     General: No focal deficit present.     Mental Status: He is alert and oriented to person, place, and time.   Psychiatric:        Attention and Perception: Attention normal. He perceives auditory and visual hallucinations.        Behavior: Behavior normal. Behavior is actively hallucinating.        Cognition and Memory: Cognition and memory normal.     Comments: Patient is reporting auditory hallucinations, however her concentration and attention are good, no RTIS, does not appear to be internally preoccupied, conversation is linear    Review of Systems  Constitutional: Negative.   HENT: Negative.    Eyes: Negative.   Respiratory: Negative.    Cardiovascular: Negative.   Gastrointestinal: Negative.   Genitourinary: Negative.   Musculoskeletal: Negative.   Skin: Negative.   Neurological: Negative.   Endo/Heme/Allergies: Negative.   Psychiatric/Behavioral:  Positive for depression. The patient is nervous/anxious and has insomnia.   All other systems reviewed and are negative.  Blood pressure  111/69, pulse (!) 58, temperature 98.7 F (37.1 C), temperature source Oral, resp. rate 18, height 5' 7 (1.702 m), weight 86.6 kg, SpO2 99%. Body mass index is 29.91 kg/m.  Treatment Plan Summary: Daily contact with patient to assess and evaluate symptoms and progress in treatment and Medication management  Patient withdrawing from Subutex for the last 2 days after leaving inpatient psychiatry at [facility] without filling his psych meds or suboxone script. I was able to review his discharge medications and patient was prescribed 7 days of Subutex, although states he was unable to fill script unavailable. UDS negative and patient has history of opioid dependence. Presented with insomnia, dperession and AH although reports AH improved after restarting Risperdal last night. Denies SI, HI or AVH today. Yesterday in ED was reporting SI with plan to walk into traffic.  Observation Level/Precautions:  15 minute checks  Laboratory:  HbAIC EKG  Psychotherapy:    Medications:  Invega   Consultations:    Discharge Concerns:  substance abuse   Estimated LOS:  Other:     Physician Treatment Plan for Primary Diagnosis: Schizoaffective disorder, bipolar type (HCC)  1.    Safety and Monitoring:   --  Voluntary admission to inpatient psychiatric unit for safety, stabilization and treatment -- Daily contact with patient to assess and evaluate symptoms and progress in treatment -- Patient's case to be discussed in multi-disciplinary team meeting -- Observation Level : q15 minute checks -- Vital signs:  q12 hours -- Precautions: suicide   2. Psychiatric Diagnoses and Treatment:   Schizoaffective disorder, bipoalr type  -- cont Rispredal 2 mg at bedtime - cont lithium  300 mg BID - increase mirtazapine  to 15 mg at bedtime for insomnia  PTSD Will abstain restarting prazosin 2 mg at bedtime patient was discharged on 2 days ago due to low BP  Opioid dependence -restart Suboxone 8 mg qdaily with plan to  titrate to 16 mg patient was discharged on from [facility] behavioral    -- Hydroxyzine  50 mg TID PRN for anxiety -- Trazodone  50 mg HS for sleep disturbances/depression   --  The risks/benefits/side-effects/alternatives to this medication were discussed in detail with the patient and time was given for questions. The patient consents to medication trial.  -- Metabolic profile and EKG monitoring obtained while on an atypical antipsychotic  (BMI: 33.99;  HbgA1c: pending; QTc: 408)  -- Encouraged patient to participate in unit milieu and in scheduled group therapies  -- Short Term Goals: Ability to identify changes in lifestyle to reduce recurrence of condition will improve, Ability to verbalize feelings will improve, Ability to disclose and discuss suicidal ideas, Ability to demonstrate self-control will improve, Ability to identify and develop effective coping behaviors will improve, Ability to maintain clinical measurements within normal limits will improve, Compliance with prescribed medications will improve, and Ability to identify triggers associated with substance abuse/mental health issues will improve -- Long Term Goals: Improvement in symptoms so as ready for discharge        3. Medical Issues Being Addressed:               Tobacco Use Disorder             -- Nicotine  gum 2mg  PRN             -- Smoking cessation encouraged   4. Discharge Planning:   -- Social work and case management to assist with discharge planning and identification of hospital follow-up needs prior to discharge -- Estimated LOS: 5-7 days -- Discharge Concerns: Need to establish a safety plan; Medication compliance and effectiveness -- Discharge Goals: Return home with outpatient referrals for mental health follow-up including medication management/psychotherapy   I certify that inpatient services furnished can reasonably be expected to improve the patient's condition.    Oluwatoni Rotunno, MD 6/20/20255:57 PM

## 2024-05-22 NOTE — BHH Suicide Risk Assessment (Signed)
 [facility] Admission Suicide Risk Assessment   Nursing information obtained from:  Patient Demographic factors:  Male, Low socioeconomic status, Unemployed Current Mental Status:  Self-harm thoughts Loss Factors:  Financial problems / change in socioeconomic status Historical Factors:  Impulsivity Risk Reduction Factors:  NA  Total Time spent with patient: 45 minutes Principal Problem: Schizoaffective disorder, bipolar type (HCC) Diagnosis:  Principal Problem:   Schizoaffective disorder, bipolar type (HCC)  Subjective Data: see H&P  Continued Clinical Symptoms:    The Alcohol Use Disorders Identification Test, Guidelines for Use in Primary Care, Second Edition.  World Science writer [facility]). Score between 0-7:  no or low risk or alcohol related problems. Score between 8-15:  moderate risk of alcohol related problems. Score between 16-19:  high risk of alcohol related problems. Score 20 or above:  warrants further diagnostic evaluation for alcohol dependence and treatment.   CLINICAL FACTORS:   Bipolar Disorder:   Mixed State Alcohol/Substance Abuse/Dependencies   Musculoskeletal: Strength & Muscle Tone: within normal limits Gait & Station: normal Patient leans: N/A  Psychiatric Specialty Exam:  Presentation  General Appearance:  Appropriate for Environment  Eye Contact: Fair  Speech: Clear and Coherent  Speech Volume: Decreased  Handedness: Right   Mood and Affect  Mood: Depressed; Dysphoric; Anxious  Affect: Congruent   Thought Process  Thought Processes: Goal Directed  Descriptions of Associations:Intact  Orientation:Full (Time, Place and Person)  Thought Content:Logical  History of Schizophrenia/Schizoaffective disorder:Yes  Duration of Psychotic Symptoms:-- (resolved)  Hallucinations:Hallucinations: None  Ideas of Reference:None  Suicidal Thoughts:Suicidal Thoughts: No  Homicidal Thoughts:Homicidal Thoughts: No   Sensorium   Memory: Immediate Fair  Judgment: Fair  Insight: Fair   Art therapist  Concentration: Fair  Attention Span: Fair  Recall: [employer] of Knowledge: Fair  Language: Fair   Psychomotor Activity  Psychomotor Activity: Psychomotor Activity: Normal   Assets  Assets: Communication Skills; Desire for Improvement; Housing; Leisure Time; Physical Health; Social Support   Sleep  Sleep: Sleep: Fair    Physical Exam: Physical Exam ROS Blood pressure 111/69, pulse (!) 58, temperature 98.7 F (37.1 C), temperature source Oral, resp. rate 18, height 5' 7 (1.702 m), weight 86.6 kg, SpO2 99%. Body mass index is 29.91 kg/m.   COGNITIVE FEATURES THAT CONTRIBUTE TO RISK:  None    SUICIDE RISK:   Severe:  Frequent, intense, and enduring suicidal ideation, specific plan, no subjective intent, but some objective markers of intent (i.e., choice of lethal method), the method is accessible, some limited preparatory behavior, evidence of impaired self-control, severe dysphoria/symptomatology, multiple risk factors present, and few if any protective factors, particularly a lack of social support.  PLAN OF CARE: admit to inpatient psychiatry  I certify that inpatient services furnished can reasonably be expected to improve the patient's condition.   Jacquline Terrill, MD 05/22/2024, 6:11 PM
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
## 2024-05-22 NOTE — Plan of Care (Signed)
  Problem: Education: Goal: Emotional status will improve Outcome: Progressing Goal: Mental status will improve Outcome: Progressing Goal: Verbalization of understanding the information provided will improve Outcome: Progressing   Problem: Safety: Goal: Periods of time without injury will increase Outcome: Progressing   

## 2024-05-22 NOTE — Group Note (Unsigned)
 Date:  05/22/2024 Time:  9:22 AM  Group Topic/Focus:  Goals Group:   The focus of this group is to help patients establish daily goals to achieve during treatment and discuss how the patient can incorporate goal setting into their daily lives to aide in recovery.     Participation Level:  {BHH PARTICIPATION DGUYQ:03474}  Participation Quality:  {BHH PARTICIPATION QUALITY:22265}  Affect:  {BHH AFFECT:22266}  Cognitive:  {BHH COGNITIVE:22267}  Insight: {BHH Insight2:20797}  Engagement in Group:  {BHH ENGAGEMENT IN QVZDG:38756}  Modes of Intervention:  {BHH MODES OF INTERVENTION:22269}  Additional Comments:  ***  Tita Form 05/22/2024, 9:22 AM

## 2024-05-23 DIAGNOSIS — F250 Schizoaffective disorder, bipolar type: Secondary | ICD-10-CM | POA: Diagnosis not present

## 2024-05-23 MED ORDER — BUPRENORPHINE HCL-NALOXONE HCL 8-2 MG SL SUBL
1.0000 | SUBLINGUAL_TABLET | Freq: Every day | SUBLINGUAL | Status: DC
  Administered 2024-05-23 – 2024-05-24 (×2): 1 via SUBLINGUAL
  Filled 2024-05-23 (×2): qty 1

## 2024-05-23 MED ORDER — BUPRENORPHINE HCL-NALOXONE HCL 8-2 MG SL SUBL
1.0000 | SUBLINGUAL_TABLET | Freq: Once | SUBLINGUAL | Status: AC
  Administered 2024-05-23: 1 via SUBLINGUAL
  Filled 2024-05-23: qty 1

## 2024-05-23 NOTE — BH Assessment (Signed)
 Patient alert and oriented last evening, polite and cooperative. Denies any SI/HI and AVH. No problems or complaints voiced. Pt remained in room and did not go to group this last evening. Will continue 15 min checks

## 2024-05-23 NOTE — Progress Notes (Signed)
   05/23/24 1100  Psych Admission Type (Psych Patients Only)  Admission Status Voluntary  Psychosocial Assessment  Patient Complaints Anxiety  Eye Contact Fair  Facial Expression Anxious  Affect Appropriate to circumstance  Speech Logical/coherent  Interaction Assertive  Motor Activity Slow  Appearance/Hygiene Unremarkable  Behavior Characteristics Appropriate to situation  Mood Depressed  Thought Process  Coherency WDL  Content WDL  Delusions None reported or observed  Perception WDL  Hallucination None reported or observed  Judgment Impaired  Confusion None  Danger to Self  Current suicidal ideation? Denies  Danger to Others  Danger to Others None reported or observed

## 2024-05-23 NOTE — Progress Notes (Signed)
Pt did not attend goals group. 

## 2024-05-23 NOTE — Progress Notes (Signed)
 [facility] MD Progress Note  05/23/2024 5:15 PM Jeremiah Newman  MRN:  [id number] Subjective:   Patient reports good sleep and appetite. He denies SI, HI or AVH today. States medications are working well. Denies nightmares Received 8 Mg of buprenorphine  this morning and states withdrawal is completely resolved but he continues have opioid cravings and think about opioids. Requesting increase to previous 16 mg dose and I administered additional 8 mg dose with good effect. Patient is calm and cooperative and denies feeling depressed or hopeless. He is not paranoid or responding to internal stimuli. Has been attending groups.   Principal Problem: Schizoaffective disorder, bipolar type (HCC) Diagnosis: Principal Problem:   Schizoaffective disorder, bipolar type (HCC)  Total Time spent with patient: 30 minutes  Past Psychiatric History: see H&P  Past Medical History:  Past Medical History:  Diagnosis Date   Schizophrenia (HCC)    History reviewed. No pertinent surgical history. Family History: History reviewed. No pertinent family history. Family Psychiatric  History: see H&P Social History:  Social History   Substance and Sexual Activity  Alcohol Use Not Currently     Social History   Substance and Sexual Activity  Drug Use No    Social History   Socioeconomic History   Marital status: Single    Spouse name: Not on file   Number of children: Not on file   Years of education: Not on file   Highest education level: Not on file  Occupational History   Not on file  Tobacco Use   Smoking status: Some Days    Current packs/day: 1.00    Average packs/day: 1 pack/day for 9.5 years (9.5 ttl pk-yrs)    Types: Cigarettes    Start date: 2016   Smokeless tobacco: Never  Vaping Use   Vaping status: Every Day  Substance and Sexual Activity   Alcohol use: Not Currently   Drug use: No   Sexual activity: Not Currently  Other Topics Concern   Not on file  Social History Narrative   Not  on file   Social Drivers of Health   Financial Resource Strain: High Risk (02/21/2024)   Received from [facility]   Overall Financial Resource Strain (CARDIA)    Difficulty of Paying Living Expenses: Very hard  Food Insecurity: No Food Insecurity (05/21/2024)   Hunger Vital Sign    Worried About Running Out of Food in the Last Year: Never true    Ran Out of Food in the Last Year: Never true  Recent Concern: Food Insecurity - High Risk (02/21/2024)   Received from [facility]   Food Insecurity    Within the past 12 months, did the food you bought just not last and you didn't have money to get more?: Yes    Within the past 12 months, did you worry that your food would run out before you got money to buy more?: Yes  Transportation Needs: No Transportation Needs (05/21/2024)   PRAPARE - Administrator, Civil Service (Medical): No    Lack of Transportation (Non-Medical): No  Recent Concern: Transportation Needs - High Risk (02/21/2024)   Received from [facility]   Transportation Needs    Within the past 12 months, has a lack of transportation kept you from medical appointments or from doing things needed for daily living?: Yes  Physical Activity: Inactive (02/21/2024)   Received from [facility]   Exercise Vital Sign  On average, how many days per week do you engage in moderate to strenuous exercise (like a brisk walk)?: 0 days    On average, how many minutes do you engage in exercise at this level?: 0 min  Stress: Stress Concern Present (02/21/2024)   Received from [facility]   [facility] - Occupational Stress Questionnaire    Feeling of Stress : Very much  Social Connections: Moderately Integrated (02/21/2024)   Received from [facility]   Social Connection and Isolation Panel    In a typical week, how many times do you talk on the phone with family,  friends, or neighbors?: More than three times a week    How often do you get together with friends or relatives?: Twice a week    How often do you attend church or religious services?: More than 4 times per year    Do you belong to any clubs or organizations such as church groups, unions, fraternal or athletic groups, or school groups?: Yes    How often do you attend meetings of the clubs or organizations you belong to?: More than 4 times per year    Are you married, widowed, divorced, separated, never married, or living with a partner?: Never married   Additional Social History:                         Sleep: Fair Estimated Sleeping Duration (Last 24 Hours): 7.00-7.50 hours  Appetite:  Fair  Current Medications: Current Facility-Administered Medications  Medication Dose Route Frequency Provider Last Rate Last Admin   acetaminophen  (TYLENOL ) tablet 650 mg  650 mg Oral Q6H PRN [name], [name], PMHNP       alum & mag hydroxide-simeth (MAALOX/MYLANTA) 200-200-20 MG/5ML suspension 30 mL  30 mL Oral Q4H PRN [name], [name], PMHNP       buprenorphine -naloxone  (SUBOXONE ) 8-2 mg per SL tablet 1 tablet  1 tablet Sublingual Daily Perrie Ragin, MD   1 tablet at 05/23/24 1250   buprenorphine -naloxone  (SUBOXONE ) 8-2 mg per SL tablet 1 tablet  1 tablet Sublingual Once Lashai Grosch, MD       haloperidol  (HALDOL ) tablet 5 mg  5 mg Oral TID PRN [name], [name], PMHNP       And   diphenhydrAMINE  (BENADRYL ) capsule 50 mg  50 mg Oral TID PRN [name], [name], PMHNP       haloperidol  lactate (HALDOL ) injection 5 mg  5 mg Intramuscular TID PRN [name], [name], PMHNP       And   diphenhydrAMINE  (BENADRYL ) injection 50 mg  50 mg Intramuscular TID PRN [name], [name], PMHNP       And   LORazepam  (ATIVAN ) injection 2 mg  2 mg Intramuscular TID PRN [name], [name], PMHNP       haloperidol  lactate (HALDOL ) injection 10 mg  10 mg  Intramuscular TID PRN [name], [name], PMHNP       And   diphenhydrAMINE  (BENADRYL ) injection 50 mg  50 mg Intramuscular TID PRN [name], [name], PMHNP       And   LORazepam  (ATIVAN ) injection 2 mg  2 mg Intramuscular TID PRN [name], [name], PMHNP       docusate sodium  (COLACE) capsule 100 mg  100 mg Oral Daily Shellye Zandi, MD   100 mg at 05/23/24 0824   hydrOXYzine  (ATARAX ) tablet 50 mg  50 mg Oral Q6H PRN Brayln Duque,  MD   50 mg at 05/22/24 2120   ibuprofen  (ADVIL ) tablet 600 mg  600 mg Oral Q8H PRN [name], [name], PMHNP       lithium  carbonate (LITHOBID ) ER tablet 300 mg  300 mg Oral Q12H [name], [name], PMHNP   300 mg at 05/23/24 0825   magnesium  hydroxide (MILK OF MAGNESIA) suspension 30 mL  30 mL Oral Daily PRN [name], [name], PMHNP       melatonin tablet 3 mg  3 mg Oral QHS [name], [name], PMHNP   3 mg at 05/22/24 2120   mirtazapine  (REMERON ) tablet 15 mg  15 mg Oral QHS Shamia Uppal, MD   15 mg at 05/22/24 2121   risperiDONE  (RISPERDAL ) tablet 2 mg  2 mg Oral QHS [name], [name], PMHNP   2 mg at 05/22/24 2120    Lab Results: No results found for this or any previous visit (from the past 48 hours).  Blood Alcohol level:  Lab Results  Component Value Date   [facility] <15 05/21/2024   ETH <10 01/30/2024    Metabolic Disorder Labs: Lab Results  Component Value Date   HGBA1C 4.7 (L) 12/23/2023   MPG 88.19 12/23/2023   MPG 93.93 10/10/2023   No results found for: PROLACTIN Lab Results  Component Value Date   CHOL 215 (H) 12/23/2023   TRIG 297 (H) 12/23/2023   HDL 40 (L) 12/23/2023   CHOLHDL 5.4 12/23/2023   VLDL 59 (H) 12/23/2023   LDLCALC 116 (H) 12/23/2023   LDLCALC 146 (H) 10/10/2023    Physical Findings: AIMS:  ,  ,  ,  ,  ,  ,   CIWA:    COWS:     Musculoskeletal: Strength & Muscle Tone: within normal limits Gait & Station: normal Patient leans: N/A  Psychiatric Specialty  Exam:  Presentation  General Appearance:  Appropriate for Environment  Eye Contact: Fair  Speech: Clear and Coherent  Speech Volume: Normal  Handedness: Right   Mood and Affect  Mood: Euthymic  Affect: Appropriate   Thought Process  Thought Processes: Coherent  Descriptions of Associations:Intact  Orientation:Full (Time, Place and Person)  Thought Content:Logical  History of Schizophrenia/Schizoaffective disorder:Yes  Duration of Psychotic Symptoms:-- (resolved)  Hallucinations:Hallucinations: None  Ideas of Reference:None  Suicidal Thoughts:Suicidal Thoughts: No  Homicidal Thoughts:Homicidal Thoughts: No   Sensorium  Memory: Immediate Fair  Judgment: Fair  Insight: Fair   Art therapist  Concentration: Fair  Attention Span: Fair  Recall: [employer] of Knowledge: Fair  Language: Fair   Psychomotor Activity  Psychomotor Activity: Psychomotor Activity: Normal   Assets  Assets: Communication Skills; Desire for Improvement; Financial Resources/Insurance; Housing; Resilience   Sleep  Sleep: Sleep: Fair    Physical Exam: Physical Exam Vitals and nursing note reviewed.  HENT:     Head: Normocephalic and atraumatic.     Nose: Nose normal.     Mouth/Throat:     Mouth: Mucous membranes are moist.    Eyes:     Extraocular Movements: Extraocular movements intact.    Pulmonary:     Effort: Pulmonary effort is normal.    Musculoskeletal:        General: Normal range of motion.     Cervical back: Normal range of motion.    Skin:    General: Skin is warm and dry.    Neurological:     General: No focal deficit present.     Mental Status: He is alert and oriented to  person, place, and time.    Psychiatric:        Attention and Perception: Attention normal. He denies auditory and visual hallucinations.        Behavior: Behavior normal. Behavior is negative for hallucinating.        Cognition and Memory:  Cognition and memory normal.     Comments: Patientdenies  auditory hallucinations, however her concentration and attention are good.     Review of Systems  Constitutional: Negative.   HENT: Negative.    Eyes: Negative.   Respiratory: Negative.    Cardiovascular: Negative.   Gastrointestinal: Negative.   Genitourinary: Negative.   Musculoskeletal: Negative.   Skin: Negative.   Neurological: Negative.   Endo/Heme/Allergies: Negative.   Psychiatric/Behavioral:  negative for depression. The patientdenies nervous/anxious and insomnia improved All other systems reviewed and are negative.   Blood pressure 102/66, pulse (!) 56, temperature 98.5 F (36.9 C), temperature source Oral, resp. rate 18, height 5' 7 (1.702 m), weight 86.6 kg, SpO2 99%. Body mass index is 29.91 kg/m.   Treatment Plan Summary: 6/20: Patient withdrawing from Subutex  for the last 2 days after leaving inpatient psychiatry at [facility] without filling his psych meds or suboxone  script. I was able to review his discharge medications and patient was prescribed 7 days of Subutex , although states he was unable to fill script unavailable. UDS negative and patient has history of opioid dependence. Presented with insomnia, dperession and AH although reports AH improved after restarting Risperdal  last night. Denies SI, HI or AVH today. Yesterday in ED was reporting SI with plan to walk into traffic.  6/21: withdrawal significantly improved. Notes opioid cravings and will increase Suboxone  dose to 16 mg as notes this was effective at [facility] Discharge. We were able to see pharmacy that scripts were sent to but unclear why they weren't filled. Will call pharmacy to verify. Patient presented with SI and AH of command nature but denies today.   Observation Level/Precautions:  15 minute checks  Laboratory:  HbAIC EKG  Psychotherapy:    Medications:  Invega   Consultations:    Discharge Concerns:  substance abuse   Estimated LOS: 1-3 days   Other:      Physician Treatment Plan for Primary Diagnosis: Schizoaffective disorder, bipolar type (HCC)   1.    Safety and Monitoring:   --  Voluntary admission to inpatient psychiatric unit for safety, stabilization and treatment -- Daily contact with patient to assess and evaluate symptoms and progress in treatment -- Patient's case to be discussed in multi-disciplinary team meeting -- Observation Level : q15 minute checks -- Vital signs:  q12 hours -- Precautions: suicide   2. Psychiatric Diagnoses and Treatment:   Schizoaffective disorder, bipoalr type  -- cont Rispredal 2 mg at bedtime - cont lithium  300 mg BID - cont mirtazapine  to 15 mg at bedtime for insomnia   PTSD Will abstain restarting prazosin  2 mg at bedtime patient was discharged on 2 days ago due to low BP   Opioid dependence -increase  Suboxone  to 16/4 mg qdaily due to residual opioid cravings; patient was discharged on 16 mg dose from [facility] behavioral       -- Hydroxyzine  50 mg TID PRN for anxiety -- Trazodone  50 mg HS for sleep disturbances/depression    --  The risks/benefits/side-effects/alternatives to this medication were discussed in detail with the patient and time was given for questions. The patient consents to medication trial.  -- Metabolic profile and EKG monitoring obtained while on an  atypical antipsychotic  (BMI: 33.99;  HbgA1c: pending; QTc: 408)  -- Encouraged patient to participate in unit milieu and in scheduled group therapies  -- Short Term Goals: Ability to identify changes in lifestyle to reduce recurrence of condition will improve, Ability to verbalize feelings will improve, Ability to disclose and discuss suicidal ideas, Ability to demonstrate self-control will improve, Ability to identify and develop effective coping behaviors will improve, Ability to maintain clinical measurements within normal limits will improve, Compliance with prescribed medications will improve, and Ability to  identify triggers associated with substance abuse/mental health issues will improve -- Long Term Goals: Improvement in symptoms so as ready for discharge        3. Medical Issues Being Addressed:               Tobacco Use Disorder             -- Nicotine  gum 2mg  PRN             -- Smoking cessation encouraged   4. Discharge Planning:   -- Social work and case management to assist with discharge planning and identification of hospital follow-up needs prior to discharge -- Estimated LOS: 1-2 days -- Discharge Concerns: Need to establish a safety plan; Medication compliance and effectiveness -- Discharge Goals: Return home with outpatient referrals for mental health follow-up including medication management/psychotherapy    Jaylon Boylen, MD 05/23/2024, 5:15 PM

## 2024-05-23 NOTE — [facility] Group Notes (Signed)
Pt did not attend wrap-up group   

## 2024-05-23 NOTE — Progress Notes (Signed)
 D: Pt alert and oriented. Pt rates depression 0/10 and anxiety 0/10.  Pt denies experiencing any SI/HI, or AVH at this time.   A: Scheduled medications administered to pt, per MD orders. Support and encouragement provided. Frequent verbal contact made. Routine safety checks conducted q15 minutes.   R: No adverse drug reactions noted. Pt verbally contracts for safety at this time. Pt complaint with medications and treatment plan. Pt interacts peers appropriately in the milieu.Pt remains safe at this time. Will continue q15 checks.

## 2024-05-24 DIAGNOSIS — F250 Schizoaffective disorder, bipolar type: Secondary | ICD-10-CM | POA: Diagnosis not present

## 2024-05-24 MED ORDER — QUETIAPINE FUMARATE 25 MG PO TABS
25.0000 mg | ORAL_TABLET | Freq: Once | ORAL | Status: AC
  Administered 2024-05-24: 25 mg via ORAL
  Filled 2024-05-24: qty 1

## 2024-05-24 MED ORDER — NICOTINE POLACRILEX 2 MG MT GUM: 2.0000 mg | CHEWING_GUM | OROMUCOSAL | Status: DC | PRN

## 2024-05-24 MED ORDER — BUPRENORPHINE HCL-NALOXONE HCL 8-2 MG SL SUBL
2.0000 | SUBLINGUAL_TABLET | Freq: Every day | SUBLINGUAL | Status: DC
  Administered 2024-05-25 – 2024-05-26 (×2): 2 via SUBLINGUAL
  Filled 2024-05-24 (×2): qty 2

## 2024-05-24 MED ORDER — SENNOSIDES-DOCUSATE SODIUM 8.6-50 MG PO TABS
2.0000 | ORAL_TABLET | Freq: Two times a day (BID) | ORAL | Status: DC
  Administered 2024-05-24 – 2024-06-01 (×16): 2 via ORAL
  Filled 2024-05-24 (×18): qty 2

## 2024-05-24 MED ORDER — BUPRENORPHINE HCL-NALOXONE HCL 8-2 MG SL SUBL
1.0000 | SUBLINGUAL_TABLET | Freq: Once | SUBLINGUAL | Status: AC
  Administered 2024-05-24: 1 via SUBLINGUAL
  Filled 2024-05-24: qty 1

## 2024-05-24 MED ORDER — POLYETHYLENE GLYCOL 3350 17 G PO PACK
17.0000 g | PACK | Freq: Every day | ORAL | Status: DC
  Administered 2024-05-24 – 2024-05-28 (×5): 17 g via ORAL
  Filled 2024-05-24 (×8): qty 1

## 2024-05-24 NOTE — Progress Notes (Signed)
   05/24/24 1600  Psych Admission Type (Psych Patients Only)  Admission Status Voluntary  Psychosocial Assessment  Patient Complaints Anxiety  Eye Contact Fair  Facial Expression Anxious  Affect Appropriate to circumstance  Speech Logical/coherent  Interaction Assertive  Motor Activity Slow  Appearance/Hygiene Unremarkable  Behavior Characteristics Appropriate to situation  Mood Depressed  Thought Process  Coherency WDL  Content WDL  Delusions None reported or observed  Perception WDL  Hallucination None reported or observed  Judgment Impaired  Confusion None  Danger to Self  Current suicidal ideation? Denies  Self-Injurious Behavior No self-injurious ideation or behavior indicators observed or expressed   Agreement Not to Harm Self Yes  Description of Agreement Verbal  Danger to Others  Danger to Others None reported or observed

## 2024-05-24 NOTE — Plan of Care (Signed)
  Problem: Education: Goal: Knowledge of [facility] Education information/materials will improve Outcome: Progressing Goal: Mental status will improve Outcome: Progressing   Problem: Safety: Goal: Periods of time without injury will increase Outcome: Progressing   

## 2024-05-24 NOTE — Progress Notes (Signed)
 [facility] MD Progress Note  05/24/2024 6:32 PM Jeremiah Newman  MRN:  [id number] Subjective:   Patient reports good sleep and appetite. He denies HI or AVH today however now reporting SI with plan to OD or run into traffic this morning. Discussed lithium  level as well as possible crosstaper of Risperdal  to Seroquel . Reports cravings are fully resolved with 16 mg Suboxone . Denies nightmares and declined restarting Prazosin  due to low BP in the mornings.  Reports feeling anxious and depressed and I'm tired of coming in and out of these places and feel I would be better of dead.  He is not paranoid or responding to internal stimuli. Has been attending groups.   Principal Problem: Schizoaffective disorder, bipolar type (HCC) Diagnosis: Principal Problem:   Schizoaffective disorder, bipolar type (HCC)  Total Time spent with patient: 30 minutes  Past Psychiatric History: see H&P  Past Medical History:  Past Medical History:  Diagnosis Date   Schizophrenia (HCC)    History reviewed. No pertinent surgical history. Family History: History reviewed. No pertinent family history. Family Psychiatric  History: see H&P Social History:  Social History   Substance and Sexual Activity  Alcohol Use Not Currently     Social History   Substance and Sexual Activity  Drug Use No    Social History   Socioeconomic History   Marital status: Single    Spouse name: Not on file   Number of children: Not on file   Years of education: Not on file   Highest education level: Not on file  Occupational History   Not on file  Tobacco Use   Smoking status: Some Days    Current packs/day: 1.00    Average packs/day: 1 pack/day for 9.5 years (9.5 ttl pk-yrs)    Types: Cigarettes    Start date: 2016   Smokeless tobacco: Never  Vaping Use   Vaping status: Every Day  Substance and Sexual Activity   Alcohol use: Not Currently   Drug use: No   Sexual activity: Not Currently  Other Topics Concern   Not on  file  Social History Narrative   Not on file   Social Drivers of Health   Financial Resource Strain: High Risk (02/21/2024)   Received from [facility]   Overall Financial Resource Strain (CARDIA)    Difficulty of Paying Living Expenses: Very hard  Food Insecurity: No Food Insecurity (05/21/2024)   Hunger Vital Sign    Worried About Running Out of Food in the Last Year: Never true    Ran Out of Food in the Last Year: Never true  Recent Concern: Food Insecurity - High Risk (02/21/2024)   Received [facility]   Food Insecurity    Within the past 12 months, did the food you bought just not last and you didn't have money to get more?: Yes    Within the past 12 months, did you worry that your food would run out before you got money to buy more?: Yes  Transportation Needs: No Transportation Needs (05/21/2024)   PRAPARE - Administrator, Civil Service (Medical): No    Lack of Transportation (Non-Medical): No  Recent Concern: Transportation Needs - High Risk (02/21/2024)   Received from [facility]   Transportation Needs    Within the past 12 months, has a lack of transportation kept you from medical appointments or from doing things needed for daily living?: Yes  Physical Activity: Inactive (02/21/2024)  Received from [facility]   Exercise Vital Sign    On average, how many days per week do you engage in moderate to strenuous exercise (like a brisk walk)?: 0 days    On average, how many minutes do you engage in exercise at this level?: 0 min  Stress: Stress Concern Present (02/21/2024)   Received from [facility]   [facility] of Occupational Health - Occupational Stress Questionnaire    Feeling of Stress : Very much  Social Connections: Moderately Integrated (02/21/2024)   Received from [facility]   Social Connection and Isolation Panel    In a typical week, how many times do  you talk on the phone with family, friends, or neighbors?: More than three times a week    How often do you get together with friends or relatives?: Twice a week    How often do you attend church or religious services?: More than 4 times per year    Do you belong to any clubs or organizations such as church groups, unions, fraternal or athletic groups, or school groups?: Yes    How often do you attend meetings of the clubs or organizations you belong to?: More than 4 times per year    Are you married, widowed, divorced, separated, never married, or living with a partner?: Never married   Additional Social History:                         Sleep: Fair Estimated Sleeping Duration (Last 24 Hours): 7.75-9.75 hours  Appetite:  Fair  Current Medications: Current Facility-Administered Medications  Medication Dose Route Frequency Provider Last Rate Last Admin   acetaminophen  (TYLENOL ) tablet 650 mg  650 mg Oral Q6H PRN [name], [name], PMHNP       alum & mag hydroxide-simeth (MAALOX/MYLANTA) 200-200-20 MG/5ML suspension 30 mL  30 mL Oral Q4H PRN [name], [name], PMHNP       [START ON 05/25/2024] buprenorphine -naloxone  (SUBOXONE ) 8-2 mg per SL tablet 2 tablet  2 tablet Sublingual Daily Talecia Sherlin, MD       haloperidol  (HALDOL ) tablet 5 mg  5 mg Oral TID PRN [name], [name], PMHNP       And   diphenhydrAMINE  (BENADRYL ) capsule 50 mg  50 mg Oral TID PRN [name], [name], PMHNP       haloperidol  lactate (HALDOL ) injection 5 mg  5 mg Intramuscular TID PRN [name], [name], PMHNP       And   diphenhydrAMINE  (BENADRYL ) injection 50 mg  50 mg Intramuscular TID PRN [name], [name], PMHNP       And   LORazepam  (ATIVAN ) injection 2 mg  2 mg Intramuscular TID PRN [name], [name], PMHNP       haloperidol  lactate (HALDOL ) injection 10 mg  10 mg Intramuscular TID PRN [name], [name], PMHNP       And   diphenhydrAMINE  (BENADRYL )  injection 50 mg  50 mg Intramuscular TID PRN [name], [name], PMHNP       And   LORazepam  (ATIVAN ) injection 2 mg  2 mg Intramuscular TID PRN [name], [name], PMHNP       hydrOXYzine  (ATARAX ) tablet 50 mg  50 mg Oral Q6H PRN Chriselda Leppert, MD   50 mg at 05/22/24 2120   ibuprofen  (ADVIL ) tablet 600 mg  600 mg Oral Q8H PRN [name], [name], PMHNP       lithium   carbonate (LITHOBID ) ER tablet 300 mg  300 mg Oral Q12H [name], [name], PMHNP   300 mg at 05/24/24 9176   magnesium  hydroxide (MILK OF MAGNESIA) suspension 30 mL  30 mL Oral Daily PRN [name], [name], PMHNP   30 mL at 05/24/24 0823   melatonin tablet 3 mg  3 mg Oral QHS [name], [name], PMHNP   3 mg at 05/23/24 2109   mirtazapine  (REMERON ) tablet 15 mg  15 mg Oral QHS Vyron Fronczak, MD   15 mg at 05/23/24 2109   nicotine  polacrilex (NICORETTE ) gum 2 mg  2 mg Oral PRN Andris Brothers, MD       polyethylene glycol (MIRALAX / GLYCOLAX) packet 17 g  17 g Oral Daily Korri Ask, MD   17 g at 05/24/24 1514   QUEtiapine  (SEROQUEL ) tablet 25 mg  25 mg Oral Once Milo Schreier, MD       risperiDONE  (RISPERDAL ) tablet 2 mg  2 mg Oral QHS [name], [name], PMHNP   2 mg at 05/23/24 2109   senna-docusate (Senokot-S) tablet 2 tablet  2 tablet Oral Q12H Makaylyn Sinyard, MD   2 tablet at 05/24/24 1514    Lab Results: No results found for this or any previous visit (from the past 48 hours).  Blood Alcohol level:  Lab Results  Component Value Date   [facility] <15 05/21/2024   ETH <10 01/30/2024    Metabolic Disorder Labs: Lab Results  Component Value Date   HGBA1C 4.7 (L) 12/23/2023   MPG 88.19 12/23/2023   MPG 93.93 10/10/2023   No results found for: PROLACTIN Lab Results  Component Value Date   CHOL 215 (H) 12/23/2023   TRIG 297 (H) 12/23/2023   HDL 40 (L) 12/23/2023   CHOLHDL 5.4 12/23/2023   VLDL 59 (H) 12/23/2023   LDLCALC 116 (H) 12/23/2023   LDLCALC 146 (H) 10/10/2023     Physical Findings: AIMS:  ,  ,  ,  ,  ,  ,   CIWA:    COWS:     Musculoskeletal: Strength & Muscle Tone: within normal limits Gait & Station: normal Patient leans: N/A  Psychiatric Specialty Exam:  Presentation  General Appearance:  Appropriate for Environment  Eye Contact: Fair  Speech: Clear and Coherent  Speech Volume: Normal  Handedness: Right   Mood and Affect  Mood: Euthymic  Affect: Appropriate   Thought Process  Thought Processes: Coherent  Descriptions of Associations:Intact  Orientation:Full (Time, Place and Person)  Thought Content:Logical  History of Schizophrenia/Schizoaffective disorder:Yes  Duration of Psychotic Symptoms:-- (resolved)  Hallucinations:Hallucinations: None  Ideas of Reference:None  Suicidal Thoughts:Suicidal Thoughts: No  Homicidal Thoughts:Homicidal Thoughts: No   Sensorium  Memory: Immediate Fair  Judgment: Fair  Insight: Fair   Art therapist  Concentration: Fair  Attention Span: Fair  Recall: [employer] of Knowledge: Fair  Language: Fair   Psychomotor Activity  Psychomotor Activity: Psychomotor Activity: Normal   Assets  Assets: Communication Skills; Desire for Improvement; Financial Resources/Insurance; Housing; Resilience   Sleep  Sleep: Sleep: Fair    Physical Exam: Physical Exam Vitals and nursing note reviewed.  HENT:     Head: Normocephalic and atraumatic.     Nose: Nose normal.     Mouth/Throat:     Mouth: Mucous membranes are moist.    Eyes:     Extraocular Movements: Extraocular movements intact.    Pulmonary:     Effort: Pulmonary effort is normal.    Musculoskeletal:  General: Normal range of motion.     Cervical back: Normal range of motion.    Skin:    General: Skin is warm and dry.    Neurological:     General: No focal deficit present.     Mental Status: He is alert and oriented to person, place, and time.    Psychiatric:         Attention and Perception: Attention normal. He denies auditory and visual hallucinations.        Behavior: Behavior normal. Behavior is negative for hallucinating.        Cognition and Memory: Cognition and memory normal.     Comments: Patientdenies  auditory hallucinations, however her concentration and attention are good.     Review of Systems  Constitutional: Negative.   HENT: Negative.    Eyes: Negative.   Respiratory: Negative.    Cardiovascular: Negative.   Gastrointestinal: Negative.   Genitourinary: Negative.   Musculoskeletal: Negative.   Skin: Negative.   Neurological: Negative.   Endo/Heme/Allergies: Negative.   Psychiatric/Behavioral:  negative for depression. The patientdenies nervous/anxious and insomnia improved All other systems reviewed and are negative.   Blood pressure 115/70, pulse 64, temperature 98.1 F (36.7 C), temperature source Oral, resp. rate 16, height 5' 7 (1.702 m), weight 86.6 kg, SpO2 98%. Body mass index is 29.91 kg/m.   Treatment Plan Summary: 6/20: Patient withdrawing from Subutex  for the last 2 days after leaving inpatient psychiatry at [facility] without filling his psych meds or suboxone  script. I was able to review his discharge medications and patient was prescribed 7 days of Subutex , although states he was unable to fill script unavailable. UDS negative and patient has history of opioid dependence. Presented with insomnia, dperession and AH although reports AH improved after restarting Risperdal  last night. Denies SI, HI or AVH today. Yesterday in ED was reporting SI with plan to walk into traffic.  6/21: withdrawal significantly improved. Notes opioid cravings and will increase Suboxone  dose to 16 mg as notes this was effective at [facility]charge. We were able to see pharmacy that scripts were sent to but unclear why they weren't filled. Will call pharmacy to verify. Patient presented with SI and AH of command nature but denies today.  6/22:  withdrawal and opiod cravings resolved. Appears calm, patient reporting depression and anxiety and SI with plan to OD this morning. Rule out malingering as patient now requesting alternative sober living and provided list of places to call.    Observation Level/Precautions:  15 minute checks  Laboratory:  HbAIC EKG  Psychotherapy:    Medications:  Invega   Consultations:    Discharge Concerns:  substance abuse   Estimated LOS: 1-3 days  Other:      Physician Treatment Plan for Primary Diagnosis: Schizoaffective disorder, bipolar type (HCC)   1.    Safety and Monitoring:   --  Voluntary admission to inpatient psychiatric unit for safety, stabilization and treatment -- Daily contact with patient to assess and evaluate symptoms and progress in treatment -- Patient's case to be discussed in multi-disciplinary team meeting -- Observation Level : q15 minute checks -- Vital signs:  q12 hours -- Precautions: suicide   2. Psychiatric Diagnoses and Treatment:   Schizoaffective disorder, bipoalr type  -- cont Rispredal 2 mg at bedtime - cont lithium  300 mg BID - cont mirtazapine  to 15 mg at bedtime for insomnia   PTSD Will abstain restarting prazosin  2 mg at bedtime patient was discharged on 2 days ago  due to low BP   Opioid dependence -increase  Suboxone  to 16/4 mg qdaily due to residual opioid cravings; patient was discharged on 16 mg dose from [facility] behavioral       -- Hydroxyzine  50 mg TID PRN for anxiety -- Trazodone  50 mg HS for sleep disturbances/depression    --  The risks/benefits/side-effects/alternatives to this medication were discussed in detail with the patient and time was given for questions. The patient consents to medication trial.  -- Metabolic profile and EKG monitoring obtained while on an atypical antipsychotic  (BMI: 33.99;  HbgA1c: pending; QTc: 408)  -- Encouraged patient to participate in unit milieu and in scheduled group therapies  -- Short Term Goals:  Ability to identify changes in lifestyle to reduce recurrence of condition will improve, Ability to verbalize feelings will improve, Ability to disclose and discuss suicidal ideas, Ability to demonstrate self-control will improve, Ability to identify and develop effective coping behaviors will improve, Ability to maintain clinical measurements within normal limits will improve, Compliance with prescribed medications will improve, and Ability to identify triggers associated with substance abuse/mental health issues will improve -- Long Term Goals: Improvement in symptoms so as ready for discharge        3. Medical Issues Being Addressed:               Tobacco Use Disorder             -- Nicotine  gum 2mg  PRN             -- Smoking cessation encouraged   4. Discharge Planning:   -- Social work and case management to assist with discharge planning and identification of hospital follow-up needs prior to discharge -- Estimated LOS: 1-2 days -- Discharge Concerns: Need to establish a safety plan; Medication compliance and effectiveness -- Discharge Goals: Return home with outpatient referrals for mental health follow-up including medication management/psychotherapy    Mckenize Mezera, MD 05/24/2024, 6:32 PM

## 2024-05-24 NOTE — Progress Notes (Signed)
   05/24/24 2000  Psych Admission Type (Psych Patients Only)  Admission Status Voluntary  Psychosocial Assessment  Patient Complaints Anxiety  Eye Contact Fair  Facial Expression Anxious  Affect Appropriate to circumstance  Speech Logical/coherent  Interaction Assertive  Motor Activity Slow  Appearance/Hygiene Unremarkable  Behavior Characteristics Appropriate to situation  Mood Depressed  Thought Process  Coherency WDL  Content WDL  Delusions None reported or observed  Perception WDL  Hallucination None reported or observed  Judgment Impaired  Confusion None  Danger to Self  Current suicidal ideation? Denies  Self-Injurious Behavior No self-injurious ideation or behavior indicators observed or expressed   Agreement Not to Harm Self Yes  Description of Agreement verbal  Danger to Others  Danger to Others None reported or observed

## 2024-05-24 NOTE — Plan of Care (Signed)
  Problem: Education: Goal: Knowledge of [facility] Education information/materials will improve Outcome: Progressing Goal: Emotional status will improve Outcome: Progressing Goal: Mental status will improve Outcome: Progressing Goal: Verbalization of understanding the information provided will improve Outcome: Progressing   Problem: Activity: Goal: Interest or engagement in activities will improve Outcome: Progressing Goal: Sleeping patterns will improve Outcome: Progressing   Problem: Coping: Goal: Ability to verbalize frustrations and anger appropriately will improve Outcome: Progressing Goal: Ability to demonstrate self-control will improve Outcome: Progressing   Problem: Health Behavior/Discharge Planning: Goal: Identification of resources available to assist in meeting health care needs will improve Outcome: Progressing Goal: Compliance with treatment plan for underlying cause of condition will improve Outcome: Progressing   Problem: Physical Regulation: Goal: Ability to maintain clinical measurements within normal limits will improve Outcome: Progressing   Problem: Safety: Goal: Periods of time without injury will increase Outcome: Progressing   Problem: Education: Goal: Knowledge of [facility] Education information/materials will improve Outcome: Progressing Goal: Emotional status will improve Outcome: Progressing Goal: Mental status will improve Outcome: Progressing Goal: Verbalization of understanding the information provided will improve Outcome: Progressing   Problem: Activity: Goal: Interest or engagement in activities will improve Outcome: Progressing Goal: Sleeping patterns will improve Outcome: Progressing   Problem: Coping: Goal: Ability to verbalize frustrations and anger appropriately will improve Outcome: Progressing Goal: Ability to demonstrate self-control will improve Outcome: Progressing   Problem: Health  Behavior/Discharge Planning: Goal: Identification of resources available to assist in meeting health care needs will improve Outcome: Progressing Goal: Compliance with treatment plan for underlying cause of condition will improve Outcome: Progressing   Problem: Physical Regulation: Goal: Ability to maintain clinical measurements within normal limits will improve Outcome: Progressing   Problem: Safety: Goal: Periods of time without injury will increase Outcome: Progressing   Problem: Activity: Goal: Interest or engagement in leisure activities will improve Outcome: Progressing Goal: Imbalance in normal sleep/wake cycle will improve Outcome: Progressing   Problem: Coping: Goal: Coping ability will improve Outcome: Progressing Goal: Will verbalize feelings Outcome: Progressing   Problem: Coping: Goal: Coping ability will improve Outcome: Progressing   Problem: Health Behavior/Discharge Planning: Goal: Identification of resources available to assist in meeting health care needs will improve Outcome: Progressing   Problem: Medication: Goal: Compliance with prescribed medication regimen will improve Outcome: Progressing   Problem: Self-Concept: Goal: Ability to disclose and discuss suicidal ideas will improve Outcome: Progressing Goal: Will verbalize positive feelings about self Outcome: Progressing Note: Patient is on track. Patient will work toward meeting this goal by      Problem: Coping: Goal: Ability to identify and develop effective coping behavior will improve Outcome: Progressing   Problem: Self-Concept: Goal: Ability to identify factors that promote anxiety will improve Outcome: Progressing Goal: Level of anxiety will decrease Outcome: Progressing Goal: Ability to modify response to factors that promote anxiety will improve Outcome: Progressing   Problem: Coping: Goal: Ability to identify and develop effective coping behavior will improve Outcome:  Progressing Goal: Ability to interact with others will improve Outcome: Progressing Goal: Demonstration of participation in decision-making regarding own care will improve Outcome: Progressing Goal: Ability to use eye contact when communicating with others will improve Outcome: Progressing

## 2024-05-24 NOTE — Progress Notes (Signed)
Pt did not attend goals group. 

## 2024-05-25 DIAGNOSIS — F29 Unspecified psychosis not due to a substance or known physiological condition: Secondary | ICD-10-CM

## 2024-05-25 MED ORDER — NICOTINE POLACRILEX 2 MG MT GUM
4.0000 mg | CHEWING_GUM | OROMUCOSAL | Status: DC | PRN
  Administered 2024-05-27 – 2024-05-31 (×17): 4 mg via ORAL
  Filled 2024-05-25 (×7): qty 2

## 2024-05-25 MED ORDER — BISACODYL 5 MG PO TBEC
5.0000 mg | DELAYED_RELEASE_TABLET | Freq: Once | ORAL | Status: AC
  Administered 2024-05-25: 5 mg via ORAL
  Filled 2024-05-25: qty 1

## 2024-05-25 NOTE — Group Note (Unsigned)
 Date:  05/25/2024 Time:  9:16 AM  Group Topic/Focus:  Goals Group:   The focus of this group is to help patients establish daily goals to achieve during treatment and discuss how the patient can incorporate goal setting into their daily lives to aide in recovery.     Participation Level:  {BHH PARTICIPATION OZCZO:77735}  Participation Quality:  {BHH PARTICIPATION QUALITY:22265}  Affect:  {BHH AFFECT:22266}  Cognitive:  {BHH COGNITIVE:22267}  Insight: {BHH Insight2:20797}  Engagement in Group:  {BHH ENGAGEMENT IN HMNLE:77731}  Modes of Intervention:  {BHH MODES OF INTERVENTION:22269}  Additional Comments:  ***  Jeremiah Newman 05/25/2024, 9:16 AM

## 2024-05-25 NOTE — Plan of Care (Signed)
   Problem: Education: Goal: Knowledge of [facility] Education information/materials will improve Outcome: Progressing Goal: Emotional status will improve Outcome: Progressing Goal: Mental status will improve Outcome: Progressing Goal: Verbalization of understanding the information provided will improve Outcome: Progressing

## 2024-05-25 NOTE — Progress Notes (Signed)
   05/25/24 0801  Psych Admission Type (Psych Patients Only)  Admission Status Voluntary  Psychosocial Assessment  Patient Complaints Anxiety;Depression  Eye Contact Fair  Facial Expression Animated  Affect Anxious  Speech Logical/coherent  Interaction Assertive  Motor Activity Other (Comment) (WDL)  Appearance/Hygiene Unremarkable  Behavior Characteristics Appropriate to situation  Mood Depressed  Thought Process  Coherency WDL  Content WDL  Delusions None reported or observed  Perception WDL  Hallucination None reported or observed  Judgment Poor  Confusion None  Danger to Self  Current suicidal ideation? Passive  Self-Injurious Behavior Some self-injurious ideation observed or expressed.  No lethal plan expressed   Agreement Not to Harm Self Yes  Description of Agreement verbal  Danger to Others  Danger to Others None reported or observed

## 2024-05-25 NOTE — Group Note (Signed)
 Date:  05/25/2024 Time:  0830  Group Topic/Focus:  Goals Group:   The focus of this group is to help patients establish daily goals to achieve during treatment and discuss how the patient can incorporate goal setting into their daily lives to aide in recovery.    Participation Level:  Did Not Attend  Participation Quality:    Affect:    Cognitive:    Insight:   Engagement in Group:    Modes of Intervention:    Additional Comments:  Pt were aware of group time. Pt refused to attend group.  Cassius LOISE Dawn 05/25/2024, 11:57 AM

## 2024-05-25 NOTE — Progress Notes (Signed)
  Jeremiah Newman   Type of Note: Substance use Treatment   Pt was given list of [employer] with information along with inpatient substance use facilities.   Pt interested in [facility], referral to be sent today. Pt has phone number to call and complete phone screen tomorrow afternoon.  Pt reports he is unable to return to [facility] because of the new medications he is on.  Will assist as needed.  Signed:  Joaquina Nissen, LCSW-A 05/25/2024  10:18 AM

## 2024-05-25 NOTE — Progress Notes (Signed)
 [facility] MD Progress Note  05/25/2024 3:08 PM Jeremiah Newman  MRN:  [id number] Subjective:  Chart reviewed. No significant interval events. Patient has been med compliant. He slept more than 10 hours last night. Denied any nightmares. He reported still feeing depressed and anxious today, rating them as 8/10. He reports ongoing passive SI. Today we briefly discussed his history, his drug use, his recent incarceration, release in October 2024, and the difficulty he has been having since. He reports that he is not having any significant cravings since starting buprenorphine  and is not having any significant side effects. He denies any Ses from risperidone  also. Today he does not endorse AH but rather states that he has been experiencing illusions of faces superimposed on various object and backgrounds, such as clouds. He further adds that sometimes the faces look like the negative thoughts he experiences in his head. He reports feeling paranoid that people are hunting him, and states that this is causing anxiety and a constant state of panic. He reports that prior to this admission he was approached three times but suspicious people and he believes that this group of people would only have been able to find him if they had connections to his parole case. He reports feeling reasonably safe in the hospital, but still paranoid. He states that the majority of his symptoms started around a few years ago around the time he was imprisoned and placed in solitary confinement. He states that he would like to discharge directly to an inpatient SUD program and plans on calling some places tomorrow.    Principal Problem: Psychoses (HCC) Diagnosis: Principal Problem:   Psychoses (HCC)  Total Time spent with patient: 20 minutes Current Medications: Current Facility-Administered Medications  Medication Dose Route Frequency Provider Last Rate Last Admin   acetaminophen  (TYLENOL ) tablet 650 mg  650 mg Oral Q6H PRN  [name], [name], PMHNP       alum & mag hydroxide-simeth (MAALOX/MYLANTA) 200-200-20 MG/5ML suspension 30 mL  30 mL Oral Q4H PRN [name], [name], PMHNP       buprenorphine -naloxone  (SUBOXONE ) 8-2 mg per SL tablet 2 tablet  2 tablet Sublingual Daily [name], [name], MD   2 tablet at 05/25/24 0801   haloperidol  (HALDOL ) tablet 5 mg  5 mg Oral TID PRN [name], [name], PMHNP       And   diphenhydrAMINE  (BENADRYL ) capsule 50 mg  50 mg Oral TID PRN [name], [name], PMHNP       haloperidol  lactate (HALDOL ) injection 5 mg  5 mg Intramuscular TID PRN [name], [name], PMHNP       And   diphenhydrAMINE  (BENADRYL ) injection 50 mg  50 mg Intramuscular TID PRN [name], [name], PMHNP       And   LORazepam  (ATIVAN ) injection 2 mg  2 mg Intramuscular TID PRN [name], [name], PMHNP       haloperidol  lactate (HALDOL ) injection 10 mg  10 mg Intramuscular TID PRN [name], [name], PMHNP       And   diphenhydrAMINE  (BENADRYL ) injection 50 mg  50 mg Intramuscular TID PRN [name], [name], PMHNP       And   LORazepam  (ATIVAN ) injection 2 mg  2 mg Intramuscular TID PRN [name], [name], PMHNP       hydrOXYzine  (ATARAX ) tablet 50 mg  50 mg Oral Q6H PRN [name], [name], MD   50 mg at 05/24/24 2044   ibuprofen  (ADVIL ) tablet 600 mg  600 mg Oral Q8H  PRN [name], [name], PMHNP       lithium  carbonate (LITHOBID ) ER tablet 300 mg  300 mg Oral Q12H [name], [name], PMHNP   300 mg at 05/25/24 0801   magnesium  hydroxide (MILK OF MAGNESIA) suspension 30 mL  30 mL Oral Daily PRN [name], [name], PMHNP   30 mL at 05/24/24 0823   melatonin tablet 3 mg  3 mg Oral QHS [name], [name], PMHNP   3 mg at 05/24/24 2044   mirtazapine  (REMERON ) tablet 15 mg  15 mg Oral QHS [name], [name], MD   15 mg at 05/24/24 2044   nicotine  polacrilex (NICORETTE ) gum 2 mg  2 mg Oral PRN [name], [name], MD       polyethylene glycol (MIRALAX  / GLYCOLAX) packet 17 g  17 g Oral Daily [name], [name], MD   17 g at 05/25/24 0801   risperiDONE  (RISPERDAL ) tablet 2 mg  2 mg Oral QHS [name], [name], PMHNP   2 mg at 05/24/24 2108   senna-docusate (Senokot-S) tablet 2 tablet  2 tablet Oral Q12H [name], [name], MD   2 tablet at 05/25/24 0801    Lab Results: No results found for this or any previous visit (from the past 48 hours).  Blood Alcohol level:  Lab Results  Component Value Date   [facility] <15 05/21/2024   ETH <10 01/30/2024    Metabolic Disorder Labs: Lab Results  Component Value Date   HGBA1C 4.7 (L) 12/23/2023   MPG 88.19 12/23/2023   MPG 93.93 10/10/2023   No results found for: PROLACTIN Lab Results  Component Value Date   CHOL 215 (H) 12/23/2023   TRIG 297 (H) 12/23/2023   HDL 40 (L) 12/23/2023   CHOLHDL 5.4 12/23/2023   VLDL 59 (H) 12/23/2023   LDLCALC 116 (H) 12/23/2023   LDLCALC 146 (H) 10/10/2023      Physical Exam: Physical Exam Vitals and nursing note reviewed.    Musculoskeletal: Normal gait and station  Mental Status Exam: Appearance - casually dressed, appropriate hygiene, many tatoos Behavior - no psychomotor agitation Attitude - Pleasant, cooperative Mood - depressed Affect - Constricted Thought Process - Linear, GD Thought Content - Endorses vague paranoia of people hunting him SI/HI - Denies Perceptions - Denies AH, endorses illusions of faces Judgement/Insight - Poor Fund of knowledge - WNL Language - No impairments   ROS Blood pressure 97/65, pulse (!) 55, temperature 98.2 F (36.8 C), temperature source Oral, resp. rate 16, height 5' 7 (1.702 m), weight 86.6 kg, SpO2 97%. Body mass index is 29.91 kg/m.   Assessment and Plan: Mr. Jeremiah Newman is a 33 y/o male with a history of polysubstance use, opioid use disorder managed with buprenorphine , and a prior diagnosis of schizophrenia/schizoaffective disorder, bipolar type, admitted for suicidal ideation in the  context of acute opioid withdrawal, expressed paranoia, and increasing symptoms of depression and anxiety. He is currently homeless and unemployed.   # Unspecified psychotic disorder - Patient has a prior diagnosis of schizophrenia/schizoaffective disorder. Suspect schizophrenia at this point, but given his history of significant substance abuse I will adjust the diagnosis to an unspecified psychotic disorder.  - Continue risperidone  2 mg at bedtime for now; consider increasing. - Continue lithium  300 mg BID - Lithium  level 6/25  # Opioid use disorder / hx of polysubstance abuse - Continue suboxone  16/4 mg SL daily - Referrals to inpatient SUD programs. This would be the ideal disposition. Otherwise an intensive outpatient SUD program would be appropriate  #  Depression and anxiety with suicidal ideation - Suspect these symptoms are related to psychosocial stressors, homelessness, drug use and withdrawal, and psychotic symptoms.  - Continue mirtazapine  15 mg at bedtime.   # Disposition - Estimated discharge 6/26 or later, depending upon placement options and symptom improvement  Oliva DELENA Salmon, MD 05/25/2024, 3:08 PM

## 2024-05-25 NOTE — Group Note (Signed)
 Recreation Therapy Group Note   Group Topic:Problem Solving  Group Date: 05/25/2024 Start Time: 0930 End Time: 1005 Facilitators: Ilyanna Baillargeon-McCall, LRT,CTRS Location: [address] Dayroom   Group Topic: Communication, Team Building, Problem Solving  Goal Area(s) Addresses:  Patient will effectively work with peer towards shared goal.  Patient will identify skills used to make activity successful.  Patient will share challenges and verbalize solution-driven approaches used. Patient will identify how skills used during activity can be used to reach post d/c goals.   Behavioral Response:   Intervention: STEM Activity   Activity: [employer]. Patients were provided the following materials: 5 drinking straws, 5 rubber bands, 5 paper clips, 2 index cards, and 2 drinking cups. Using the provided materials patients were asked to build a launching mechanism to launch a ping pong ball across the room, approximately 10 feet. Patients were divided into teams of 3-5. Instructions required all materials be incorporated into the device, functionality of items left to the peer group's discretion.  Education: Pharmacist, community, Scientist, physiological, Air cabin crew, Building control surveyor.   Education Outcome: Acknowledges education/In group clarification offered/Needs additional education.    Affect/Mood: N/A   Participation Level: Did not attend    Clinical Observations/Individualized Feedback:     Plan: Continue to engage patient in RT group sessions 2-3x/week.   Mirelle Biskup-McCall, LRT,CTRS 05/25/2024 12:56 PM

## 2024-05-25 NOTE — Group Note (Signed)
 Date:  05/25/2024 Time:  9:29 PM  Group Topic/Focus:  Wrap-Up Group:   The focus of this group is to help patients review their daily goal of treatment and discuss progress on daily workbooks.    Additional Comments:  Pt was encouraged, but opted out of attending the AA meeting this evening.   Jeremiah Newman 05/25/2024, 9:29 PM

## 2024-05-26 MED ORDER — BUPRENORPHINE HCL-NALOXONE HCL 8-2 MG SL SUBL
1.0000 | SUBLINGUAL_TABLET | Freq: Every day | SUBLINGUAL | Status: DC
  Administered 2024-05-27 – 2024-05-31 (×5): 1 via SUBLINGUAL
  Filled 2024-05-26 (×5): qty 1

## 2024-05-26 NOTE — Group Note (Signed)
 Recreation Therapy Group Note   Group Topic:Animal Assisted Therapy   Group Date: 05/26/2024 Start Time: 9053 End Time: 1030 Facilitators: Querida Beretta-McCall, LRT,CTRS Location: [address] Dayroom   Animal-Assisted Activity (AAA) Program Checklist/Progress Notes Patient Eligibility Criteria Checklist & Daily Group note for Rec Tx Intervention  AAA/T Program Assumption of Risk Form signed by Patient/ or Parent Legal Guardian Yes  Patient understands his/her participation is voluntary Yes  Behavioral Response:    Education: Charity fundraiser, Appropriate Animal Interaction   Education Outcome: Acknowledges education.    Affect/Mood: N/A   Participation Level: Did not attend    Clinical Observations/Individualized Feedback:     Plan: Continue to engage patient in RT group sessions 2-3x/week.   Shaft Corigliano-McCall, LRT,CTRS 05/26/2024 1:17 PM

## 2024-05-26 NOTE — Progress Notes (Signed)
 Adult Psychoeducational Group Note  Date:  05/26/2024 Time:  9:07 PM  Group Topic/Focus:  Wrap-Up Group:   The focus of this group is to help patients review their daily goal of treatment and discuss progress on daily workbooks.  Participation Level:  Active  Participation Quality:  Appropriate  Affect:  Appropriate  Cognitive:  Appropriate  Insight: Appropriate  Engagement in Group:  Engaged  Modes of Intervention:  Discussion  Additional Comments:  Jeremiah Newman said he had a peaceful day today. This was his goal.  [name] 05/26/2024, 9:07 PM

## 2024-05-26 NOTE — Progress Notes (Signed)
   05/25/24 2000  Psych Admission Type (Psych Patients Only)  Admission Status Voluntary  Psychosocial Assessment  Eye Contact Fair  Facial Expression Anxious  Affect Appropriate to circumstance  Speech Logical/coherent  Interaction Assertive  Motor Activity Slow  Appearance/Hygiene Unremarkable  Behavior Characteristics Appropriate to situation  Mood Depressed  Thought Process  Coherency WDL  Content WDL  Delusions None reported or observed  Perception WDL  Hallucination None reported or observed  Judgment Impaired  Confusion None  Danger to Self  Current suicidal ideation? Denies  Self-Injurious Behavior No self-injurious ideation or behavior indicators observed or expressed   Agreement Not to Harm Self Yes  Description of Agreement verbal  Danger to Others  Danger to Others None reported or observed

## 2024-05-26 NOTE — Group Note (Signed)
 LCSW Group Therapy Note   Group Date: 05/26/2024 Start Time: 1100 End Time: 1200   Participation:  patient was present and and actively participated in the discussion.  He was insightful.  Type of Therapy:  Group Therapy  Topic:  Shining from Within:  Confidence and Self-Love Journey  Objective:  to support participants in developing confidence and self-love through self-awareness, self-compassion, and practical skills that nurture personal growth.   Group Goals Encourage self-reflection and self-acceptance by identifying personal strengths and achievements. Teach skills to challenge negative self-talk and replace it with supportive, truthful self-talk. Foster resilience and self-worth through [employer], gratitude, and self-care practices.   Summary:  this group explores the connection between confidence and self-love by guiding participants through reflection, mindset shifts, and practical tools like affirmations, strength recognition, and goal-setting. Activities are designed to promote self-compassion, build emotional resilience, and normalize the slow, patient journey of inner growth.   Therapeutic Modalities Used Cognitive Behavioral Therapy (CBT): Challenging and reframing unhelpful self-talk Motivational Interviewing (MI): Encouraging small, achievable goals   [place] Giammona O Ravyn Nikkel, LCSWA 05/26/2024  6:59 PM

## 2024-05-26 NOTE — Group Note (Addendum)
 Date:  05/26/2024 Time:  11:45 AM  Group Topic/Focus:  Goals Group:   The focus of this group is to help patients establish daily goals to achieve during treatment and discuss how the patient can incorporate goal setting into their daily lives to aide in recovery.    Participation Level:  Did Not Attend  Participation Quality:  na  Affect:  na  Cognitive:  na  Insight: na  Engagement in Group:  na  Modes of Intervention:  na  Additional Comments:  Pt did not attend group  Jenniger Figiel 05/26/2024, 11:45 AM

## 2024-05-26 NOTE — Progress Notes (Signed)
  Jeremiah Newman   Type of Note: [facility]  Referral faxed this morning to [facility]. Pt given phone number to complete phone screen this afternoon. Pt agreeable.   Signed:  Merrill Deanda, LCSW-A 05/26/2024  10:07 AM

## 2024-05-26 NOTE — Progress Notes (Signed)
  Jeremiah Newman   Type of Note: [facility]  Patient is interested in [facility] substance use treatment at discharge. Pt called them this afternoon and they are requesting application to be sent to this writer.   Email provided to patient, reports he called to give to the facility. Will print once received.  Will continue to assist.  Signed:  Hollace Michelli, LCSW-A 05/26/2024  3:32 PM

## 2024-05-26 NOTE — Progress Notes (Signed)
   05/26/24 0804  Psych Admission Type (Psych Patients Only)  Admission Status Voluntary  Psychosocial Assessment  Patient Complaints Self-harm thoughts  Eye Contact Fair  Facial Expression Animated  Affect Appropriate to circumstance  Speech Logical/coherent  Interaction Assertive  Motor Activity Other (Comment) (WDL)  Appearance/Hygiene Unremarkable  Behavior Characteristics Appropriate to situation  Mood Depressed  Thought Process  Coherency WDL  Content WDL  Delusions None reported or observed  Perception WDL  Hallucination None reported or observed  Judgment Poor  Confusion None  Danger to Self  Current suicidal ideation? Passive  Self-Injurious Behavior Some self-injurious ideation observed or expressed.  No lethal plan expressed   Agreement Not to Harm Self Yes  Description of Agreement verbal  Danger to Others  Danger to Others None reported or observed

## 2024-05-26 NOTE — Plan of Care (Signed)
   Problem: Education: Goal: Knowledge of [facility] Education information/materials will improve Outcome: Progressing Goal: Emotional status will improve Outcome: Progressing Goal: Mental status will improve Outcome: Progressing Goal: Verbalization of understanding the information provided will improve Outcome: Progressing

## 2024-05-26 NOTE — Progress Notes (Signed)
 [facility] MD Progress Note  05/26/2024 5:34 PM Jeremiah Newman  MRN:  [id number] Subjective:  Chart reviewed. Patient slept about 7 hours last night. He reports continuing anxiety and passive suicidal ideation. He has been compliant with his prescribed medications and denies any significant side effects. He asked multiple times to see the SW today in order to follow-up with and further call SUD facilities and was scheduled for Newman phone interview today with [facility]. He denied any significant opioid cravings today.    Principal Problem: Psychoses (HCC) Diagnosis: Principal Problem:   Psychoses (HCC)  Total Time spent with patient: 20 minutes Current Medications: Current Facility-Administered Medications  Medication Dose Route Frequency Provider Last Rate Last Admin   acetaminophen  (TYLENOL ) tablet 650 mg  650 mg Oral Q6H PRN Jeremiah Newman, PMHNP       alum & mag hydroxide-simeth (MAALOX/MYLANTA) 200-200-20 MG/5ML suspension 30 mL  30 mL Oral Q4H PRN Jeremiah Newman, PMHNP       buprenorphine -naloxone  (SUBOXONE ) 8-2 mg per SL tablet 2 tablet  2 tablet Sublingual Daily [name], [name], MD   2 tablet at 05/26/24 0803   haloperidol  (HALDOL ) tablet 5 mg  5 mg Oral TID PRN Jeremiah Newman, PMHNP       And   diphenhydrAMINE  (BENADRYL ) capsule 50 mg  50 mg Oral TID PRN Jeremiah Newman, PMHNP   50 mg at 05/25/24 2103   haloperidol  lactate (HALDOL ) injection 5 mg  5 mg Intramuscular TID PRN Jeremiah Newman, PMHNP       And   diphenhydrAMINE  (BENADRYL ) injection 50 mg  50 mg Intramuscular TID PRN Jeremiah Newman, PMHNP       And   LORazepam  (ATIVAN ) injection 2 mg  2 mg Intramuscular TID PRN Jeremiah Newman, PMHNP       haloperidol  lactate (HALDOL ) injection 10 mg  10 mg Intramuscular TID PRN Jeremiah Newman, PMHNP       And   diphenhydrAMINE  (BENADRYL ) injection 50 mg  50 mg Intramuscular TID PRN Jeremiah Newman, PMHNP       And    LORazepam  (ATIVAN ) injection 2 mg  2 mg Intramuscular TID PRN Jeremiah Newman, PMHNP       hydrOXYzine  (ATARAX ) tablet 50 mg  50 mg Oral Q6H PRN [name], [name], MD   50 mg at 05/24/24 2044   ibuprofen  (ADVIL ) tablet 600 mg  600 mg Oral Q8H PRN Jeremiah Newman, PMHNP       lithium  carbonate (LITHOBID ) ER tablet 300 mg  300 mg Oral Q12H Jeremiah Newman, PMHNP   300 mg at 05/26/24 0804   magnesium  hydroxide (MILK OF MAGNESIA) suspension 30 mL  30 mL Oral Daily PRN Jeremiah Newman, PMHNP   30 mL at 05/24/24 0823   melatonin tablet 3 mg  3 mg Oral QHS Jeremiah Newman, PMHNP   3 mg at 05/25/24 2104   mirtazapine  (REMERON ) tablet 15 mg  15 mg Oral QHS [name], [name], MD   15 mg at 05/25/24 2104   nicotine  polacrilex (NICORETTE ) gum 4 mg  4 mg Oral PRN Jeremiah Kitchens A, DO       polyethylene glycol (MIRALAX / GLYCOLAX) packet 17 g  17 g Oral Daily [name], [name], MD   17 g at 05/26/24 0804   risperiDONE  (RISPERDAL ) tablet 2 mg  2 mg Oral QHS Jeremiah Newman, PMHNP   2 mg at 05/25/24 2103   senna-docusate (Senokot-S) tablet 2 tablet  2 tablet Oral Q12H [name], [name], MD   2 tablet at 05/26/24 0803    Lab Results: No results found for this or any previous visit (from the past 48 hours).  Blood Alcohol level:  Lab Results  Component Value Date   [facility] <15 05/21/2024   ETH <10 01/30/2024    Metabolic Disorder Labs: Lab Results  Component Value Date   HGBA1C 4.7 (L) 12/23/2023   MPG 88.19 12/23/2023   MPG 93.93 10/10/2023   No results found for: PROLACTIN Lab Results  Component Value Date   CHOL 215 (H) 12/23/2023   TRIG 297 (H) 12/23/2023   HDL 40 (L) 12/23/2023   CHOLHDL 5.4 12/23/2023   VLDL 59 (H) 12/23/2023   LDLCALC 116 (H) 12/23/2023   LDLCALC 146 (H) 10/10/2023      Physical Exam: Physical Exam Vitals and nursing note reviewed.    Musculoskeletal: Normal gait and station  Mental Status Exam: Appearance - casually  dressed, appropriate hygiene, many tatoos Behavior - no psychomotor agitation Attitude - Pleasant, cooperative Mood - decent Affect - restricted Thought Process - Linear, GD Thought Content - Endorses vague paranoia of people hunting him SI/HI - passive thoughts of being better off dead Perceptions - Denies AH, endorses illusions of faces Judgement/Insight - Poor Fund of knowledge - WNL Language - No impairments   ROS Blood pressure 122/62, pulse (!) 52, temperature 98.1 F (36.7 C), temperature source Oral, resp. rate 18, height 5' 7 (1.702 m), weight 86.6 kg, SpO2 99%. Body mass index is 29.91 kg/m.   Assessment and Plan: Mr. [name] is Newman 33 y/o male with Newman history of polysubstance use, opioid use disorder managed with buprenorphine , and Newman prior diagnosis of schizophrenia/schizoaffective disorder, bipolar type, admitted for suicidal ideation in the context of acute opioid withdrawal, expressed paranoia, and increasing symptoms of depression and anxiety. He is currently homeless and unemployed.   6/24 - Mild improvement, though patient reports continuing anxiety and passive SI. Still seeking SUD treatment, which is recommended at this time. No change in medications. Lithium  level tomorrow.   # Unspecified psychotic disorder - Patient has Newman prior diagnosis of schizophrenia/schizoaffective disorder. Suspect schizophrenia at this point, but given his history of significant substance abuse I will adjust the diagnosis to an unspecified psychotic disorder.  - Continue risperidone  2 mg at bedtime for now; consider increasing. - Continue lithium  300 mg BID - Lithium  level 6/25  # Opioid use disorder / hx of polysubstance abuse - Continue suboxone  16/4 mg SL daily - Referrals to inpatient SUD programs. This would be the ideal disposition. Otherwise an intensive outpatient SUD program would be appropriate  # Depression and anxiety with suicidal ideation - Suspect these symptoms  are related to psychosocial stressors, homelessness, drug use and withdrawal, and psychotic symptoms.  - Continue mirtazapine  15 mg at bedtime.   # Disposition - Estimated discharge 6/26 or later, depending upon placement options and symptom improvement  Oliva DELENA Salmon, DO 05/26/2024, 5:34 PM

## 2024-05-27 ENCOUNTER — Encounter (HOSPITAL_COMMUNITY): Payer: Self-pay

## 2024-05-27 DIAGNOSIS — F39 Unspecified mood [affective] disorder: Secondary | ICD-10-CM | POA: Insufficient documentation

## 2024-05-27 DIAGNOSIS — F1190 Opioid use, unspecified, uncomplicated: Secondary | ICD-10-CM | POA: Insufficient documentation

## 2024-05-27 LAB — BASIC METABOLIC PANEL WITH GFR
Anion gap: 9 (ref 5–15)
BUN: 10 mg/dL (ref 6–20)
CO2: 26 mmol/L (ref 22–32)
Calcium: 9.7 mg/dL (ref 8.9–10.3)
Chloride: 105 mmol/L (ref 98–111)
Creatinine, Ser: 0.88 mg/dL (ref 0.61–1.24)
GFR, Estimated: >60 mL/min (ref >60)
Glucose, Bld: 95 mg/dL (ref 70–99)
Potassium: 4 mmol/L (ref 3.5–5.1)
Sodium: 140 mmol/L (ref 135–145)

## 2024-05-27 LAB — LITHIUM LEVEL: Lithium Lvl: 0.4 mmol/L — ABNORMAL LOW (ref 0.60–1.20)

## 2024-05-27 LAB — TSH: TSH: 4.247 u[IU]/mL (ref 0.350–4.500)

## 2024-05-27 MED ORDER — RISPERIDONE 1 MG PO TABS
1.0000 mg | ORAL_TABLET | Freq: Every day | ORAL | Status: DC
  Administered 2024-05-27 – 2024-05-30 (×4): 1 mg via ORAL
  Filled 2024-05-27 (×4): qty 1

## 2024-05-27 MED ORDER — LITHIUM CARBONATE ER 300 MG PO TBCR
300.0000 mg | EXTENDED_RELEASE_TABLET | Freq: Every day | ORAL | Status: DC
  Administered 2024-05-28 – 2024-05-29 (×2): 300 mg via ORAL
  Filled 2024-05-27 (×2): qty 1

## 2024-05-27 NOTE — Plan of Care (Signed)
   Problem: Education: Goal: Knowledge of [facility] Education information/materials will improve Outcome: Progressing Goal: Emotional status will improve Outcome: Progressing Goal: Mental status will improve Outcome: Progressing

## 2024-05-27 NOTE — Group Note (Signed)
 Date:  05/27/2024 Time:  0830am  Group Topic/Focus:  Goals Group:   The focus of this group is to help patients establish daily goals to achieve during treatment and discuss how the patient can incorporate goal setting into their daily lives to aide in recovery.    Participation Level:  Did Not Attend  Participation Quality:  Did not attend  Affect:    Cognitive:    Insight:   Engagement in Group:    Modes of Intervention:    Additional Comments:  Pt were aware of group time. Pt refused to attend.  Jeremiah Newman 05/27/2024, 10:26 AM

## 2024-05-27 NOTE — Group Note (Addendum)
 Recreation Therapy Group Note   Group Topic:Communication  Group Date: 05/27/2024 Start Time: 9065 End Time: 1008 Facilitators: Philomena Buttermore-McCall, LRT,CTRS Location: [address] Dayroom   Group Topic: Communication, Problem Solving   Goal Area(s) Addresses:  Patient will effectively listen to complete activity.  Patient will identify communication skills used to make activity successful.  Patient will identify how skills used during activity can be used to reach post d/c goals.    Behavioral Response: Attentive   Intervention: Building surveyor Activity - Geometric pattern cards, pencils, blank paper    Activity: Geometric Drawings.  Three volunteers from the peer group will be shown an abstract picture with a particular arrangement of geometrical shapes.  Each round, one 'speaker' will describe the pattern, as accurately as possible without revealing the image to the group.  The remaining group members will listen and draw the picture to reflect how it is described to them. Patients with the role of 'listener' cannot ask clarifying questions but, may request that the speaker repeat a direction. Once the drawings are complete, the presenter will show the rest of the group the picture and compare how close each person came to drawing the picture. LRT will facilitate a post-activity discussion regarding effective communication and the importance of planning, listening, and asking for clarification in daily interactions with others.  Education: Environmental consultant, Active listening, Support systems, Discharge planning  Education Outcome: Acknowledges understanding/In group clarification offered/Needs additional education.    Affect/Mood: Appropriate   Participation Level: Engaged   Participation Quality: Independent   Behavior: Attentive    Speech/Thought Process: Relevant   Insight: Moderate   Judgement: Moderate   Modes of Intervention: Problem-solving   Patient  Response to Interventions:  Attentive   Education Outcome:  In group clarification offered    Clinical Observations/Individualized Feedback: Pt completed activities to the best of his ability. Pt was engaging and attentive. Pt left group early and didn't return.     Plan: Continue to engage patient in RT group sessions 2-3x/week.   Genni Buske-McCall, LRT,CTRS 05/27/2024 1:06 PM

## 2024-05-27 NOTE — BH Assessment (Signed)
 Spoke with patient and he is anxious about discharge. Pt states they are trying to find him a place to stay and having problems. Pt is worried he will be homeless. Reported passive SI and verbally contracts for safety.

## 2024-05-27 NOTE — Group Note (Signed)
 Date:  05/27/2024 Time:  9:40 PM  Group Topic/Focus:  Narcotics Anonymous (NA) Meeting    Participation Level:  Active  Participation Quality:  Appropriate  Affect:  Appropriate  Cognitive:  Appropriate  Insight: Appropriate  Engagement in Group:  Engaged  Modes of Intervention:  Discussion, Socialization, and Support  Additional Comments:  Patient attended NA  Jeremiah Newman 05/27/2024, 9:40 PM

## 2024-05-27 NOTE — Progress Notes (Signed)
  Arley KATHEE Picket   Type of Note: [facility]  Referral has been faxed to [facility] [phone]) including application, H&P, and medication list.   Pt has not completed [facility] phone screen.   Pt interested in [facility] instead of [facility].   Will update/assist as needed.  Signed:  Elanor Cale, LCSW-A 05/27/2024  2:57 PM

## 2024-05-27 NOTE — BH IP Treatment Plan (Signed)
 Interdisciplinary Treatment and Diagnostic Plan Update  05/27/2024 Time of Session: THIS IS AN UPDATE Jeremiah Newman MRN: [id number]  Principal Diagnosis: Psychoses [facility])  Secondary Diagnoses: Principal Problem:   Psychoses (HCC)   Current Medications:  Current Facility-Administered Medications  Medication Dose Route Frequency Provider Last Rate Last Admin   acetaminophen  (TYLENOL ) tablet 650 mg  650 mg Oral Q6H PRN [name], [name], PMHNP   650 mg at 05/26/24 2051   alum & mag hydroxide-simeth (MAALOX/MYLANTA) 200-200-20 MG/5ML suspension 30 mL  30 mL Oral Q4H PRN [name], [name], PMHNP       buprenorphine -naloxone  (SUBOXONE ) 8-2 mg per SL tablet 1 tablet  1 tablet Sublingual Daily Bouchard, Marc A, DO   1 tablet at 05/27/24 0907   haloperidol  (HALDOL ) tablet 5 mg  5 mg Oral TID PRN [name], [name], PMHNP       And   diphenhydrAMINE  (BENADRYL ) capsule 50 mg  50 mg Oral TID PRN [name], [name], PMHNP   50 mg at 05/25/24 2103   haloperidol  lactate (HALDOL ) injection 5 mg  5 mg Intramuscular TID PRN [name], [name], PMHNP       And   diphenhydrAMINE  (BENADRYL ) injection 50 mg  50 mg Intramuscular TID PRN [name], [name], PMHNP       And   LORazepam  (ATIVAN ) injection 2 mg  2 mg Intramuscular TID PRN [name], [name], PMHNP       haloperidol  lactate (HALDOL ) injection 10 mg  10 mg Intramuscular TID PRN [name], [name], PMHNP       And   diphenhydrAMINE  (BENADRYL ) injection 50 mg  50 mg Intramuscular TID PRN [name], [name], PMHNP       And   LORazepam  (ATIVAN ) injection 2 mg  2 mg Intramuscular TID PRN [name], [name], PMHNP       hydrOXYzine  (ATARAX ) tablet 50 mg  50 mg Oral Q6H PRN [name], [name], MD   50 mg at 05/24/24 2044   ibuprofen  (ADVIL ) tablet 600 mg  600 mg Oral Q8H PRN [name], [name], PMHNP       lithium  carbonate (LITHOBID ) ER tablet 300 mg  300 mg Oral Q12H [name],  [name], PMHNP   300 mg at 05/27/24 0907   magnesium  hydroxide (MILK OF MAGNESIA) suspension 30 mL  30 mL Oral Daily PRN [name], [name], PMHNP   30 mL at 05/24/24 0823   melatonin tablet 3 mg  3 mg Oral QHS [name], [name], PMHNP   3 mg at 05/26/24 2051   mirtazapine  (REMERON ) tablet 15 mg  15 mg Oral QHS [name], [name], MD   15 mg at 05/26/24 2051   nicotine  polacrilex (NICORETTE ) gum 4 mg  4 mg Oral PRN Prentis Kitchens A, DO   4 mg at 05/27/24 0911   polyethylene glycol (MIRALAX / GLYCOLAX) packet 17 g  17 g Oral Daily [name], [name], MD   17 g at 05/27/24 0910   risperiDONE  (RISPERDAL ) tablet 2 mg  2 mg Oral QHS [name], [name], PMHNP   2 mg at 05/26/24 2051   senna-docusate (Senokot-S) tablet 2 tablet  2 tablet Oral Q12H [name], [name], MD   2 tablet at 05/27/24 0907   PTA Medications: Medications Prior to Admission  Medication Sig Dispense Refill Last Dose/Taking   buprenorphine  (SUBUTEX ) 8 MG SUBL SL tablet Place 16 mg under the tongue daily.      lithium  carbonate (LITHOBID ) 300 MG ER tablet Take 1 tablet (300 mg  total) by mouth every 12 (twelve) hours. (Patient taking differently: Take 300 mg by mouth at bedtime.) 60 tablet 0    mirtazapine  (REMERON ) 7.5 MG tablet Take 1 tablet (7.5 mg total) by mouth at bedtime. 30 tablet 0    paliperidone  (INVEGA  SUSTENNA) 234 MG/1.5ML injection Inject 156 mg into the muscle once for 1 dose. 1 mL 0    paliperidone  (INVEGA ) 1.5 MG 24 hr tablet Take 1 tablet (1.5 mg total) by mouth daily for 7 days. 7 tablet 0    prazosin  (MINIPRESS ) 2 MG capsule Take 2 mg by mouth at bedtime.      risperiDONE  (RISPERDAL ) 2 MG tablet Take 2 mg by mouth at bedtime.      traZODone  (DESYREL ) 100 MG tablet Take 2 tablets (200 mg total) by mouth at bedtime as needed for up to 28 days for sleep. 28 tablet 0    Vitamin D, Ergocalciferol, (DRISDOL) 1.25 MG (50000 UNIT) CAPS capsule Take 50,000 Units by mouth every 7 (seven) days.       Patient  Stressors: Financial difficulties   Medication change or noncompliance   Substance abuse    Patient Strengths: Ability for Contractor for treatment/growth   Treatment Modalities: Medication Management, Group therapy, Case management,  1 to 1 session with clinician, Psychoeducation, Recreational therapy.   Physician Treatment Plan for Primary Diagnosis: Psychoses (HCC) Long Term Goal(s):     Short Term Goals:    Medication Management: Evaluate patient's response, side effects, and tolerance of medication regimen.  Therapeutic Interventions: 1 to 1 sessions, Unit Group sessions and Medication administration.  Evaluation of Outcomes: Progressing  Physician Treatment Plan for Secondary Diagnosis: Principal Problem:   Psychoses (HCC)  Long Term Goal(s):     Short Term Goals:       Medication Management: Evaluate patient's response, side effects, and tolerance of medication regimen.  Therapeutic Interventions: 1 to 1 sessions, Unit Group sessions and Medication administration.  Evaluation of Outcomes: Progressing   RN Treatment Plan for Primary Diagnosis: Psychoses (HCC) Long Term Goal(s): Knowledge of disease and therapeutic regimen to maintain health will improve  Short Term Goals: Ability to verbalize feelings will improve and Ability to identify and develop effective coping behaviors will improve  Medication Management: RN will administer medications as ordered by provider, will assess and evaluate patient's response and provide education to patient for prescribed medication. RN will report any adverse and/or side effects to prescribing provider.  Therapeutic Interventions: 1 on 1 counseling sessions, Psychoeducation, Medication administration, Evaluate responses to treatment, Monitor vital signs and CBGs as ordered, Perform/monitor CIWA, COWS, AIMS and Fall Risk screenings as ordered, Perform wound care treatments as ordered.  Evaluation of  Outcomes: Progressing   LCSW Treatment Plan for Primary Diagnosis: Psychoses (HCC) Long Term Goal(s): Safe transition to appropriate next level of care at discharge, Engage patient in therapeutic group addressing interpersonal concerns.  Short Term Goals: Engage patient in aftercare planning with referrals and resources, Increase social support, and Increase skills for wellness and recovery  Therapeutic Interventions: Assess for all discharge needs, 1 to 1 time with Social worker, Explore available resources and support systems, Assess for adequacy in community support network, Educate family and significant other(s) on suicide prevention, Complete Psychosocial Assessment, Interpersonal group therapy.  Evaluation of Outcomes: Progressing   Progress in Treatment: Attending groups: attended minimal groups Participating in groups: Yes. Taking medication as prescribed: Yes. Toleration medication: Yes. Family/Significant other contact made: patient declined consents Patient understands  diagnosis: Yes. Discussing patient identified problems/goals with staff: Yes. Medical problems stabilized or resolved: Yes. Denies suicidal/homicidal ideation: Yes. Issues/concerns per patient self-inventory: No.   New problem(s) identified:  No   New Short Term/Long Term Goal(s):     medication stabilization, elimination of SI thoughts, development of comprehensive mental wellness plan.    Patient Goals:  I want to get my medications right.    Discharge Plan or Barriers:  Patient recently admitted. CSW will continue to follow and assess for appropriate referrals and possible discharge planning.    Reason for Continuation of Hospitalization: Hallucinations Medication stabilization Suicidal ideation   Estimated Length of Stay:  2-3 days  Last 3 [place] Suicide Severity Risk Score: Flowsheet Row [facility] Most recent  reading at 05/21/2024  6:18 PM ED from 05/21/2024 in [facility] Emergency Department at [facility] Most recent reading at 05/21/2024  7:46 AM Admission (Discharged) from 01/30/2024 in [facility] Most recent reading at 01/30/2024  9:17 PM  C-SSRS RISK CATEGORY Error: Q7 should not be populated when Q6 is No High Risk Error: Q7 should not be populated when Q6 is No    Last PHQ 2/9 Scores:     No data to display          Scribe for Treatment Team: Jenkins LULLA Primer, LCSWA 05/27/2024 11:36 AM

## 2024-05-27 NOTE — Group Note (Unsigned)
 Date:  05/27/2024 Time:  9:29 AM  Group Topic/Focus:  Goals Group:   The focus of this group is to help patients establish daily goals to achieve during treatment and discuss how the patient can incorporate goal setting into their daily lives to aide in recovery.     Participation Level:  {BHH PARTICIPATION OZCZO:77735}  Participation Quality:  {BHH PARTICIPATION QUALITY:22265}  Affect:  {BHH AFFECT:22266}  Cognitive:  {BHH COGNITIVE:22267}  Insight: {BHH Insight2:20797}  Engagement in Group:  {BHH ENGAGEMENT IN HMNLE:77731}  Modes of Intervention:  {BHH MODES OF INTERVENTION:22269}  Additional Comments:  ***  Cassius LOISE Dawn 05/27/2024, 9:29 AM

## 2024-05-27 NOTE — Progress Notes (Signed)
 [facility] MD Progress Note  05/27/2024 5:50 PM FLAVIO LINDROTH  MRN:  [id number] Subjective:  Chart reviewed. No significant events overnight. Patient slept well last night. He requested that his nicotine  gum dosage be increased to 4 mg. He reported ongoing passive suicidal ideation without specific thoughts or plan and moderate to severe anxiety. He denied any hallucinations, and stated that he does not believe he has a psychotic disorder. He stated that his mood was depressed and hopeless and that sometimes he feels paranoid because he doesn't feel like anyone wants to help him and that the system is against him. He denied any opioid cravings today despite decreasing buprenorphine  to 8/2 mg today. He continues to look for a place to go for inpatient rehab. Today he requested a referral to a place in [place] called [facility]. He is hopeful to obtain a bed ASAP.     Principal Problem: Substance-induced psychotic disorder (HCC) Diagnosis: Principal Problem:   Substance-induced psychotic disorder (HCC) Active Problems:   Mood disorder (HCC)   Opioid use disorder  Total Time spent with patient: 20 minutes Current Medications: Current Facility-Administered Medications  Medication Dose Route Frequency Provider Last Rate Last Admin   acetaminophen  (TYLENOL ) tablet 650 mg  650 mg Oral Q6H PRN [name], [name], PMHNP   650 mg at 05/26/24 2051   alum & mag hydroxide-simeth (MAALOX/MYLANTA) 200-200-20 MG/5ML suspension 30 mL  30 mL Oral Q4H PRN [name], [name], PMHNP       buprenorphine -naloxone  (SUBOXONE ) 8-2 mg per SL tablet 1 tablet  1 tablet Sublingual Daily Jeannia Tatro A, DO   1 tablet at 05/27/24 0907   haloperidol  (HALDOL ) tablet 5 mg  5 mg Oral TID PRN [name], [name], PMHNP       And   diphenhydrAMINE  (BENADRYL ) capsule 50 mg  50 mg Oral TID PRN [name], [name], PMHNP   50 mg at 05/25/24 2103   haloperidol  lactate (HALDOL ) injection 5 mg  5 mg  Intramuscular TID PRN [name], [name], PMHNP       And   diphenhydrAMINE  (BENADRYL ) injection 50 mg  50 mg Intramuscular TID PRN [name], [name], PMHNP       And   LORazepam  (ATIVAN ) injection 2 mg  2 mg Intramuscular TID PRN [name], [name], PMHNP       haloperidol  lactate (HALDOL ) injection 10 mg  10 mg Intramuscular TID PRN [name], [name], PMHNP       And   diphenhydrAMINE  (BENADRYL ) injection 50 mg  50 mg Intramuscular TID PRN [name], [name], PMHNP       And   LORazepam  (ATIVAN ) injection 2 mg  2 mg Intramuscular TID PRN [name], [name], PMHNP       hydrOXYzine  (ATARAX ) tablet 50 mg  50 mg Oral Q6H PRN [name], [name], MD   50 mg at 05/24/24 2044   ibuprofen  (ADVIL ) tablet 600 mg  600 mg Oral Q8H PRN [name], [name], PMHNP       lithium  carbonate (LITHOBID ) ER tablet 300 mg  300 mg Oral Q12H [name], [name], PMHNP   300 mg at 05/27/24 0907   magnesium  hydroxide (MILK OF MAGNESIA) suspension 30 mL  30 mL Oral Daily PRN [name], [name], PMHNP   30 mL at 05/24/24 0823   melatonin tablet 3 mg  3 mg Oral QHS [name], [name], PMHNP   3 mg at 05/26/24 2051   mirtazapine  (REMERON ) tablet 15 mg  15 mg Oral QHS [name],  [name], MD   15 mg at 05/26/24 2051   nicotine  polacrilex (NICORETTE ) gum 4 mg  4 mg Oral PRN Prentis Kitchens A, DO   4 mg at 05/27/24 1612   polyethylene glycol (MIRALAX / GLYCOLAX) packet 17 g  17 g Oral Daily [name], [name], MD   17 g at 05/27/24 0910   risperiDONE  (RISPERDAL ) tablet 2 mg  2 mg Oral QHS [name], [name], PMHNP   2 mg at 05/26/24 2051   senna-docusate (Senokot-S) tablet 2 tablet  2 tablet Oral Q12H [name], [name], MD   2 tablet at 05/27/24 9092    Lab Results:  Results for orders placed or performed during the hospital encounter of 05/21/24 (from the past 48 hours)  Lithium  level     Status: Abnormal   Collection Time: 05/27/24  6:44 AM  Result Value Ref Range   Lithium   Lvl 0.40 (L) 0.60 - 1.20 mmol/L    Comment: Performed at [facility], [address] [place], [place] [address]  TSH     Status: None   Collection Time: 05/27/24  6:44 AM  Result Value Ref Range   TSH 4.247 0.350 - 4.500 uIU/mL    Comment: Performed by a 3rd Generation assay with a functional sensitivity of <=0.01 uIU/mL. Performed at [facility], [address] [place], [place] [address]   Basic metabolic panel     Status: None   Collection Time: 05/27/24  6:44 AM  Result Value Ref Range   Sodium 140 135 - 145 mmol/L   Potassium 4.0 3.5 - 5.1 mmol/L   Chloride 105 98 - 111 mmol/L   CO2 26 22 - 32 mmol/L   Glucose, Bld 95 70 - 99 mg/dL    Comment: Glucose reference range applies only to samples taken after fasting for at least 8 hours.   BUN 10 6 - 20 mg/dL   Creatinine, Ser 9.11 0.61 - 1.24 mg/dL   Calcium 9.7 8.9 - 89.6 mg/dL   GFR, Estimated >39 >39 mL/min    Comment: (NOTE) Calculated using the CKD-EPI Creatinine Equation (2021)    Anion gap 9 5 - 15    Comment: Performed at [facility], [address], [place], [place] [address]    Blood Alcohol level:  Lab Results  Component Value Date   [facility] <15 05/21/2024   ETH <10 01/30/2024    Metabolic Disorder Labs: Lab Results  Component Value Date   HGBA1C 4.7 (L) 12/23/2023   MPG 88.19 12/23/2023   MPG 93.93 10/10/2023   No results found for: PROLACTIN Lab Results  Component Value Date   CHOL 215 (H) 12/23/2023   TRIG 297 (H) 12/23/2023   HDL 40 (L) 12/23/2023   CHOLHDL 5.4 12/23/2023   VLDL 59 (H) 12/23/2023   LDLCALC 116 (H) 12/23/2023   LDLCALC 146 (H) 10/10/2023      Physical Exam: Physical Exam Vitals and nursing note reviewed.    Musculoskeletal: Normal gait and station  Mental Status Exam: Appearance - casually dressed, appropriate hygiene, many tatoos Behavior - no psychomotor agitation Attitude - Pleasant, cooperative Mood -  depressed Affect - restricted Thought Process - Linear, GD Thought Content - does not endorse any clear paranoid ideation SI/HI - passive thoughts of being better off dead Perceptions - Denies AH; not RIS Judgement/Insight - Poor Fund of knowledge - WNL Language - No impairments   ROS Blood pressure 115/70, pulse 73, temperature 98.7 F (37.1 C), temperature  source Oral, resp. rate 18, height 5' 7 (1.702 m), weight 86.6 kg, SpO2 98%. Body mass index is 29.91 kg/m.   Assessment and Plan: Mr. Romulo Okray is a 33 y/o male with a history of polysubstance use, opioid use disorder managed with buprenorphine , and a prior diagnosis of schizophrenia/schizoaffective disorder, bipolar type, admitted for suicidal ideation in the context of acute opioid withdrawal, expressed paranoia, and increasing symptoms of depression and anxiety. He is currently homeless and unemployed.   6/24 - Mild improvement, though patient reports continuing anxiety and passive SI. Still seeking SUD treatment, which is recommended at this time. No change in medications. Lithium  level tomorrow.   6/25 - Patient has been insisting over the past two days that he does not have a psychotic disorder and describes anxiety and depressive symptoms rather than paranoia. He more likely seems to have experienced a substance induced psychotic disorder in the past. His diagnosis will be revised.   # Substance induced psychotic disorder, resolved - Revised from schizoaffective disorder, bipolar type. Current presentation is more consistent with a chronic/repeated substance induced psychotic disorder.  - Decrease risperidone  to 1 mg qhs - Lower lithium  to 300 mg at bedtime. Plan to stop prior to DC. - Lithium  level 6/25  # Opioid use disorder / hx of polysubstance abuse - Continue suboxone  8/2 mg SL daily - Referrals to inpatient SUD programs. This would be the ideal disposition. Otherwise an intensive outpatient SUD program would  be appropriate  # Depression and anxiety with suicidal ideation - Suspect these symptoms are related to psychosocial stressors, homelessness, drug use and withdrawal, and psychotic symptoms.  - Continue mirtazapine  15 mg at bedtime.   # Disposition - Estimated discharge 6/26 or later, depending upon placement options and symptom improvement  Oliva DELENA Salmon, DO 05/27/2024, 5:50 PM

## 2024-05-28 NOTE — Progress Notes (Signed)
 Adult Psychoeducational Group Note  Date:  05/28/2024 Time:  8:56 PM  Group Topic/Focus:  Wrap-Up Group:   The focus of this group is to help patients review their daily goal of treatment and discuss progress on daily workbooks.  Participation Level:  Active  Participation Quality:  Appropriate  Affect:  Appropriate  Cognitive:  Appropriate  Insight: Appropriate  Engagement in Group:  Engaged  Modes of Intervention:  Discussion  Additional Comments:  Jeremiah Newman said his goal peace with self and others  Jeremiah Newman [name] 05/28/2024, 8:56 PM

## 2024-05-28 NOTE — Progress Notes (Signed)
   05/28/24 2200  Psych Admission Type (Psych Patients Only)  Admission Status Voluntary  Psychosocial Assessment  Patient Complaints Anxiety;Worrying  Eye Contact Fair  Facial Expression Anxious  Affect Appropriate to circumstance  Speech Logical/coherent  Interaction Assertive  Motor Activity Other (Comment) (Safety/Standard)  Appearance/Hygiene Unremarkable  Behavior Characteristics Cooperative  Mood Anxious  Thought Process  Coherency WDL  Content WDL  Delusions None reported or observed  Perception WDL  Hallucination None reported or observed  Judgment Impaired  Confusion None  Danger to Self  Current suicidal ideation? Denies  Self-Injurious Behavior No self-injurious ideation or behavior indicators observed or expressed   Agreement Not to Harm Self Yes  Description of Agreement Verbal  Danger to Others  Danger to Others None reported or observed

## 2024-05-28 NOTE — Group Note (Signed)
 LCSW Group Therapy Note   Group Date: 05/28/2024 Start Time: 1100 End Time: 1200   Participation:  did not attend  Type of Therapy:  Group Therapy  Topic:  Healing Hearts: A Safe Space for Grief  Objective:  The objective of this group, Healing Hearts: A Safe Space for Grief, is to create a compassionate environment where participants can process their grief, explore different stages of grief, and discover ways to honor their loved ones through personal rituals.  3 Goals: Provide a safe and supportive space where participants feel comfortable sharing their feelings and experiences of grief without judgment. Educate participants about the stages of grief and emphasize that there is no right way to grieve or a fixed timeline for healing. Introduce the concept of rituals as a means to process grief, allowing individuals to honor their loved ones in a personal and meaningful way.  Summary:  In Healing Hearts: A Safe Space for Grief, we explored the unique and personal journey of grief, emphasizing that everyone experiences it differently. We discussed the 5 stages of grief (denial, anger, bargaining, depression, and acceptance), with the understanding that grief is not linear. Rituals were introduced as a way to help cope with loss, offering comfort and connection through meaningful actions such as lighting candles or taking memory walks. Participants were encouraged to express their emotions, focus on self-care, and reflect on moments of gratitude for their loved ones, recognizing that healing is a process and there is no timeline for grief.  Therapeutic Modalities: Elements of CBT, Cognitive Reframing: challenge negative thoughts (e.g., guilt, self-blame) by identifying and replacing them with healthier perspectives. Normalizing Grief: Educate that grief doesn't have a fixed timeline and everyone experiences it differently).  DBT Techniques:  Radical Acceptance: Accept the pain of loss  without judgment, acknowledging that grief is a natural and necessary process.   Group Support: Foster peer sharing, normalizing diverse grief experiences in a safe, supportive environment.   Jeremiah Newman Jeremiah Newman Jeremiah Newman Jeremiah Newman, LCSWA 05/28/2024  5:43 PM

## 2024-05-28 NOTE — Plan of Care (Signed)

## 2024-05-28 NOTE — Progress Notes (Addendum)
  Jeremiah Newman   Type of Note: Discharge planning  Pt reports calling [facility] today, they have no openings for males.   Pt requested list of [place] houses with vacancies, provided for patient. Reports he will call this afternoon.  2:55PM: patient also completed [facility] long-term residential application. Faxed to [phone].   Signed:  Kinzley Savell, LCSW-A 05/28/2024  1:57 PM

## 2024-05-28 NOTE — B[facility] Group Notes (Signed)
 Spirituality Group   Focus of discussion: Gratitude and Strength Awareness   Process: Following theoretical framework of group therapy of Irvin Yalom and further informed by Rogerian and Relational Cultural Theory approaches, participants invited to name:   Sources of gratitude (internal>external)   Articulate gratitude for self   Name a personal strength/gift/skill   Locate points of resonance among group members/engage the here and now   Conclude with grounding/breathwork    Observations: Jeremiah Newman was an active participant in the group discussion. His faith has informed how he copes and makes meaning and finds resilience. He displayed empathy for peers.  Mandeep Ferch L. Fredrica, M.Div [phone]

## 2024-05-28 NOTE — Progress Notes (Signed)
 [facility] MD Progress Note  05/28/2024 7:27 PM Jeremiah Newman  MRN:  [id number] Subjective:  Chart reviewed. No significant events overnight. Patient slept well last night. He has been compliant with his medications. He denies any significant worsening or change in mood or anxiety since yesterday. Reports passive SI without current intent or plan. No reports of opioid withdrawal. He called multiple locations today for inpatient SUD treatment and continues to diligently search for an accepting facility. Today an application was sent to the [facility] facility. We discussed his progress and the goals of the admission and that we should plan for discharge by Monday EOB unless he has been accepted at a SUD facility. He stated understanding.     Principal Problem: Substance-induced psychotic disorder (HCC) Diagnosis: Principal Problem:   Substance-induced psychotic disorder (HCC) Active Problems:   Mood disorder (HCC)   Opioid use disorder   Current Medications: Current Facility-Administered Medications  Medication Dose Route Frequency Provider Last Rate Last Admin   acetaminophen  (TYLENOL ) tablet 650 mg  650 mg Oral Q6H PRN [name], [name], PMHNP   650 mg at 05/27/24 2120   alum & mag hydroxide-simeth (MAALOX/MYLANTA) 200-200-20 MG/5ML suspension 30 mL  30 mL Oral Q4H PRN [name], [name], PMHNP       buprenorphine -naloxone  (SUBOXONE ) 8-2 mg per SL tablet 1 tablet  1 tablet Sublingual Daily Deliyah Muckle A, DO   1 tablet at 05/28/24 0808   haloperidol  (HALDOL ) tablet 5 mg  5 mg Oral TID PRN [name], [name], PMHNP       And   diphenhydrAMINE  (BENADRYL ) capsule 50 mg  50 mg Oral TID PRN [name], [name], PMHNP   50 mg at 05/25/24 2103   haloperidol  lactate (HALDOL ) injection 5 mg  5 mg Intramuscular TID PRN [name], [name], PMHNP       And   diphenhydrAMINE  (BENADRYL ) injection 50 mg  50 mg Intramuscular TID PRN [name], [name],  PMHNP       And   LORazepam  (ATIVAN ) injection 2 mg  2 mg Intramuscular TID PRN [name], [name], PMHNP       haloperidol  lactate (HALDOL ) injection 10 mg  10 mg Intramuscular TID PRN [name], [name], PMHNP       And   diphenhydrAMINE  (BENADRYL ) injection 50 mg  50 mg Intramuscular TID PRN [name], [name], PMHNP       And   LORazepam  (ATIVAN ) injection 2 mg  2 mg Intramuscular TID PRN [name], [name], PMHNP       hydrOXYzine  (ATARAX ) tablet 50 mg  50 mg Oral Q6H PRN [name], [name], MD   50 mg at 05/28/24 1911   ibuprofen  (ADVIL ) tablet 600 mg  600 mg Oral Q8H PRN [name], [name], PMHNP       lithium  carbonate (LITHOBID ) ER tablet 300 mg  300 mg Oral QHS Adajah Cocking A, DO       magnesium  hydroxide (MILK OF MAGNESIA) suspension 30 mL  30 mL Oral Daily PRN [name], [name], PMHNP   30 mL at 05/24/24 0823   melatonin tablet 3 mg  3 mg Oral QHS [name], [name], PMHNP   3 mg at 05/27/24 2120   mirtazapine  (REMERON ) tablet 15 mg  15 mg Oral QHS [name], [name], MD   15 mg at 05/27/24 2120   nicotine  polacrilex (NICORETTE ) gum 4 mg  4 mg Oral PRN Prentis Kitchens A, DO   4 mg at 05/28/24 1909   polyethylene  glycol (MIRALAX / GLYCOLAX) packet 17 g  17 g Oral Daily [name], [name], MD   17 g at 05/28/24 9191   risperiDONE  (RISPERDAL ) tablet 1 mg  1 mg Oral QHS Syvanna Ciolino A, DO   1 mg at 05/27/24 2120   senna-docusate (Senokot-S) tablet 2 tablet  2 tablet Oral Q12H [name], [name], MD   2 tablet at 05/28/24 9191    Lab Results:  Results for orders placed or performed during the hospital encounter of 05/21/24 (from the past 48 hours)  Lithium  level     Status: Abnormal   Collection Time: 05/27/24  6:44 AM  Result Value Ref Range   Lithium  Lvl 0.40 (L) 0.60 - 1.20 mmol/L    Comment: Performed at [facility], [address], [place], [place] [address]  TSH     Status: None   Collection Time: 05/27/24  6:44 AM  Result Value  Ref Range   TSH 4.247 0.350 - 4.500 uIU/mL    Comment: Performed by a 3rd Generation assay with a functional sensitivity of <=0.01 uIU/mL. Performed at [facility], [address] [place], [place] [address]   Basic metabolic panel     Status: None   Collection Time: 05/27/24  6:44 AM  Result Value Ref Range   Sodium 140 135 - 145 mmol/L   Potassium 4.0 3.5 - 5.1 mmol/L   Chloride 105 98 - 111 mmol/L   CO2 26 22 - 32 mmol/L   Glucose, Bld 95 70 - 99 mg/dL    Comment: Glucose reference range applies only to samples taken after fasting for at least 8 hours.   BUN 10 6 - 20 mg/dL   Creatinine, Ser 9.11 0.61 - 1.24 mg/dL   Calcium 9.7 8.9 - 89.6 mg/dL   GFR, Estimated >39 >39 mL/min    Comment: (NOTE) Calculated using the CKD-EPI Creatinine Equation (2021)    Anion gap 9 5 - 15    Comment: Performed at [facility], [address] [place], [place] [address]    Blood Alcohol level:  Lab Results  Component Value Date   [facility] <15 05/21/2024   ETH <10 01/30/2024    Metabolic Disorder Labs: Lab Results  Component Value Date   HGBA1C 4.7 (L) 12/23/2023   MPG 88.19 12/23/2023   MPG 93.93 10/10/2023   No results found for: PROLACTIN Lab Results  Component Value Date   CHOL 215 (H) 12/23/2023   TRIG 297 (H) 12/23/2023   HDL 40 (L) 12/23/2023   CHOLHDL 5.4 12/23/2023   VLDL 59 (H) 12/23/2023   LDLCALC 116 (H) 12/23/2023   LDLCALC 146 (H) 10/10/2023      Physical Exam: Physical Exam Vitals and nursing note reviewed.    Musculoskeletal: Normal gait and station  Mental Status Exam: Appearance - casually dressed, appropriate hygiene, many tatoos Behavior - no psychomotor agitation Attitude - Pleasant, cooperative Mood - Stressed Affect - restricted, but reactive Thought Process - Linear, GD Thought Content - does not endorse any clear paranoid ideation SI/HI - passive thoughts of being better off dead Perceptions - Denies  AH; not RIS Judgement/Insight - Poor Fund of knowledge - WNL Language - No impairments   ROS Blood pressure 122/79, pulse 70, temperature 98.2 F (36.8 C), temperature source Oral, resp. rate 18, height 5' 7 (1.702 m), weight 86.6 kg, SpO2 99%. Body mass index is 29.91 kg/m.   Assessment and Plan: Mr. Wang Granada is a 33 y/o male  with a history of polysubstance use, opioid use disorder managed with buprenorphine , and a prior diagnosis of schizophrenia/schizoaffective disorder, bipolar type, admitted for suicidal ideation in the context of acute opioid withdrawal, expressed paranoia, and increasing symptoms of depression and anxiety. He is currently homeless and unemployed.   6/24 - Mild improvement, though patient reports continuing anxiety and passive SI. Still seeking SUD treatment, which is recommended at this time. No change in medications. Lithium  level tomorrow.   6/25 - Patient has been insisting over the past two days that he does not have a psychotic disorder and describes anxiety and depressive symptoms rather than paranoia. He more likely seems to have experienced a substance induced psychotic disorder in the past. His diagnosis will be revised.   6/26 - Patient presents with stable mood. He is medically appropriate for discharge at this time, but remains in the hospital due to high likelihood for SUD relapse if not discharged directly to an SUD treatment program. At this time discharge is planned for Monday at the latest, without an accepting facility.  # Substance induced psychotic disorder, resolved - Revised from schizoaffective disorder, bipolar type. Current presentation is more consistent with a chronic/repeated substance induced psychotic disorder.  - Risperidone  1 mg at bedtime. Plan to lower further. - Li  300 mg at bedtime. Plan to stop prior to DC. - Lithium  level 6/25  # Opioid use disorder / hx of polysubstance abuse - Continue suboxone  8/2 mg SL daily -  Referrals to inpatient SUD programs. This would be the ideal disposition. Otherwise an intensive outpatient SUD program would be appropriate  # Depression and anxiety with suicidal ideation - Suspect these symptoms are related to psychosocial stressors, homelessness, drug use and withdrawal, and psychotic symptoms.  - Continue mirtazapine  15 mg at bedtime.   # Disposition - Estimated discharge by  6/30  Oliva DELENA Salmon, DO 05/28/2024, 7:27 PM

## 2024-05-28 NOTE — Progress Notes (Signed)
   05/28/24 0800  Psych Admission Type (Psych Patients Only)  Admission Status Voluntary  Psychosocial Assessment  Patient Complaints Anxiety;Worrying  Eye Contact Fair  Facial Expression Anxious  Affect Appropriate to circumstance  Speech Logical/coherent  Interaction Assertive  Motor Activity Other (Comment) (steady gait)  Appearance/Hygiene Unremarkable  Behavior Characteristics Cooperative;Appropriate to situation  Mood Depressed;Anxious  Thought Process  Coherency WDL  Content WDL  Delusions None reported or observed  Perception WDL  Hallucination None reported or observed  Judgment Impaired  Confusion None  Danger to Self  Current suicidal ideation? Denies  Self-Injurious Behavior No self-injurious ideation or behavior indicators observed or expressed   Agreement Not to Harm Self Yes  Description of Agreement agreed to contact staff before acting on harmful thoughts  Danger to Others  Danger to Others None reported or observed

## 2024-05-29 DIAGNOSIS — F1190 Opioid use, unspecified, uncomplicated: Secondary | ICD-10-CM

## 2024-05-29 DIAGNOSIS — F39 Unspecified mood [affective] disorder: Secondary | ICD-10-CM

## 2024-05-29 DIAGNOSIS — F19959 Other psychoactive substance use, unspecified with psychoactive substance-induced psychotic disorder, unspecified: Secondary | ICD-10-CM

## 2024-05-29 NOTE — Progress Notes (Signed)
  Arley KATHEE Picket   Type of Note: [employer] with patient who reports he was accepted to an [facility]. ([address] [place], [place] [address]).   This Clinical research associate called and confirmed with house manager, Tod [phone] who confirmed pt was accepted and can arrive on Monday afternoon. Curtis requested pt be dropped off at the [address] (instead of [address] - physical address) because it will bring the driver to the wrong street on the GPS). [address]    Pt still interested in [facility] if that is an option. Encouraged patient to call this afternoon regarding admission decision. Pt agreed.  Signed:  Raisha Brabender, LCSW-A 05/29/2024  1:28 PM

## 2024-05-29 NOTE — Group Note (Signed)
 Recreation Therapy Group Note   Group Topic:Team Building  Group Date: 05/29/2024 Start Time: 0930 End Time: 1000 Facilitators: Tonnya Garbett-McCall, LRT,CTRS Location: [address] Day[address]   Group Topic: Communication, Team Building, Problem Solving  Goal Area(s) Addresses:  Patient will effectively work with peer towards shared goal.  Patient will identify skills used to make activity successful.  Patient will identify how skills used during activity can be used to reach post d/c goals.   Behavioral Response:   Intervention: STEM Activity  Activity: Straw Bridge. In teams of 3-5, patients were given 15 plastic drinking straws and an equal length of masking tape. Using the materials provided, patients were instructed to build a free standing bridge-like structure to suspend an everyday item (ex: puzzle box) off of the floor or table surface. All materials were required to be used by the team in their design. LRT facilitated post-activity discussion reviewing team process. Patients were encouraged to reflect how the skills used in this activity can be generalized to daily life post discharge.   Education: Pharmacist, community, Scientist, physiological, Discharge Planning   Education Outcome: Acknowledges education/In group clarification offered/Needs additional education.    Affect/Mood: N/A   Participation Level: Did not attend    Clinical Observations/Individualized Feedback:     Plan: Continue to engage patient in RT group sessions 2-3x/week.   Jarvin Ogren-McCall, LRT,CTRS 05/29/2024 1:09 PM

## 2024-05-29 NOTE — Progress Notes (Signed)
 [facility] MD Progress Note  05/29/2024 7:01 PM Jeremiah Newman  MRN:  [id number] Subjective:  Chart reviewed. No significant events overnight. Patient slept well last night. He has been compliant with his medications. No significant opioid cravings. He reports a stable mood. He denies AVH or SI. Anxiety has been mild to moderate and primarily related to the risk of relapse and uncertainty over disposition. However, he reported receiving acceptance at an [facility] in [place] on Monday and is excited about this.    Principal Problem: Substance-induced psychotic disorder (HCC) Diagnosis: Principal Problem:   Substance-induced psychotic disorder (HCC) Active Problems:   Mood disorder (HCC)   Opioid use disorder   Current Medications: Current Facility-Administered Medications  Medication Dose Route Frequency Provider Last Rate Last Admin   acetaminophen  (TYLENOL ) tablet 650 mg  650 mg Oral Q6H PRN [name], [name], PMHNP   650 mg at 05/28/24 2135   alum & mag hydroxide-simeth (MAALOX/MYLANTA) 200-200-20 MG/5ML suspension 30 mL  30 mL Oral Q4H PRN [name], [name], PMHNP       buprenorphine -naloxone  (SUBOXONE ) 8-2 mg per SL tablet 1 tablet  1 tablet Sublingual Daily Zoe Goonan A, DO   1 tablet at 05/29/24 9180   haloperidol  (HALDOL ) tablet 5 mg  5 mg Oral TID PRN [name], [name], PMHNP       And   diphenhydrAMINE  (BENADRYL ) capsule 50 mg  50 mg Oral TID PRN [name], [name], PMHNP   50 mg at 05/25/24 2103   haloperidol  lactate (HALDOL ) injection 5 mg  5 mg Intramuscular TID PRN [name], [name], PMHNP       And   diphenhydrAMINE  (BENADRYL ) injection 50 mg  50 mg Intramuscular TID PRN [name], [name], PMHNP       And   LORazepam  (ATIVAN ) injection 2 mg  2 mg Intramuscular TID PRN [name], [name], PMHNP       haloperidol  lactate (HALDOL ) injection 10 mg  10 mg Intramuscular TID PRN [name], [name], PMHNP       And    diphenhydrAMINE  (BENADRYL ) injection 50 mg  50 mg Intramuscular TID PRN [name], [name], PMHNP       And   LORazepam  (ATIVAN ) injection 2 mg  2 mg Intramuscular TID PRN [name], [name], PMHNP       hydrOXYzine  (ATARAX ) tablet 50 mg  50 mg Oral Q6H PRN [name], [name], MD   50 mg at 05/28/24 1911   ibuprofen  (ADVIL ) tablet 600 mg  600 mg Oral Q8H PRN [name], [name], PMHNP       lithium  carbonate (LITHOBID ) ER tablet 300 mg  300 mg Oral QHS Ronen Bromwell A, DO   300 mg at 05/28/24 2131   magnesium  hydroxide (MILK OF MAGNESIA) suspension 30 mL  30 mL Oral Daily PRN [name], [name], PMHNP   30 mL at 05/24/24 0823   melatonin tablet 3 mg  3 mg Oral QHS [name], [name], PMHNP   3 mg at 05/28/24 2130   mirtazapine  (REMERON ) tablet 15 mg  15 mg Oral QHS [name], [name], MD   15 mg at 05/28/24 2131   nicotine  polacrilex (NICORETTE ) gum 4 mg  4 mg Oral PRN Prentis Kitchens A, DO   4 mg at 05/29/24 1618   polyethylene glycol (MIRALAX  / GLYCOLAX ) packet 17 g  17 g Oral Daily [name], [name], MD   17 g at 05/28/24 0808   risperiDONE  (RISPERDAL ) tablet 1 mg  1 mg Oral QHS  Prentis Kitchens A, DO   1 mg at 05/28/24 2130   senna-docusate (Senokot-S) tablet 2 tablet  2 tablet Oral Q12H [name], [name], MD   2 tablet at 05/29/24 9180    Lab Results:  No results found for this or any previous visit (from the past 48 hours).   Blood Alcohol level:  Lab Results  Component Value Date   [facility] <15 05/21/2024   ETH <10 01/30/2024    Metabolic Disorder Labs: Lab Results  Component Value Date   HGBA1C 4.7 (L) 12/23/2023   MPG 88.19 12/23/2023   MPG 93.93 10/10/2023   No results found for: PROLACTIN Lab Results  Component Value Date   CHOL 215 (H) 12/23/2023   TRIG 297 (H) 12/23/2023   HDL 40 (L) 12/23/2023   CHOLHDL 5.4 12/23/2023   VLDL 59 (H) 12/23/2023   LDLCALC 116 (H) 12/23/2023   LDLCALC 146 (H) 10/10/2023      Physical Exam: Physical Exam Vitals and  nursing note reviewed.    Musculoskeletal: Normal gait and station  Mental Status Exam: Appearance - casually dressed, appropriate hygiene, many tatoos Behavior - no psychomotor agitation Attitude - Pleasant, cooperative Mood - Good Affect - mildly restricted, but reactive Thought Process - Linear, GD Thought Content - does not endorse any clear paranoid ideation SI/HI - passive thoughts of being better off dead Perceptions - Denies AH; not RIS Judgement/Insight - Poor Fund of knowledge - WNL Language - No impairments   ROS Blood pressure 131/80, pulse 77, temperature 98.3 F (36.8 C), temperature source Oral, resp. rate 17, height 5' 7 (1.702 m), weight 86.6 kg, SpO2 97%. Body mass index is 29.91 kg/m.   Assessment and Plan: Mr. Jeremiah Newman is a 33 y/o male with a history of polysubstance use, opioid use disorder managed with buprenorphine , and a prior diagnosis of schizophrenia/schizoaffective disorder, bipolar type, admitted for suicidal ideation in the context of acute opioid withdrawal, expressed paranoia, and increasing symptoms of depression and anxiety. He is currently homeless and unemployed.   6/24 - Mild improvement, though patient reports continuing anxiety and passive SI. Still seeking SUD treatment, which is recommended at this time. No change in medications. Lithium  level tomorrow.   6/25 - Patient has been insisting over the past two days that he does not have a psychotic disorder and describes anxiety and depressive symptoms rather than paranoia. He more likely seems to have experienced a substance induced psychotic disorder in the past. His diagnosis will be revised.   6/26 - Patient presents with stable mood. He is medically appropriate for discharge at this time, but remains in the hospital due to high likelihood for SUD relapse if not discharged directly to an SUD treatment program. At this time discharge is planned for Monday at the latest, without an  accepting facility.  6/27 - Doing well. Tapering off risperidone  and lithium  due to lack of indication. Plan for DC Monday to [facility].   # Substance induced psychotic disorder, resolved - Revised from schizoaffective disorder, bipolar type. Current presentation is more consistent with a chronic/repeated substance induced psychotic disorder.  - Risperidone  1 mg at bedtime. Plan to lower further. - Li  300 mg at bedtime. Plan to stop prior to DC. - Lithium  level 6/25  # Opioid use disorder / hx of polysubstance abuse - Continue suboxone  8/2 mg SL daily - Referrals to inpatient SUD programs. This would be the ideal disposition. Otherwise an intensive outpatient SUD program would be appropriate  #  Depression and anxiety with suicidal ideation - Suspect these symptoms are related to psychosocial stressors, homelessness, drug use and withdrawal, and psychotic symptoms.  - Continue mirtazapine  15 mg at bedtime.   # Disposition - Estimated discharge by  6/30  Oliva DELENA Salmon, DO 05/29/2024, 7:01 PM

## 2024-05-29 NOTE — Plan of Care (Signed)
 ?  Problem: Activity: ?Goal: Interest or engagement in activities will improve ?Outcome: Progressing ?Goal: Sleeping patterns will improve ?Outcome: Progressing ?  ?Problem: Coping: ?Goal: Ability to verbalize frustrations and anger appropriately will improve ?Outcome: Progressing ?Goal: Ability to demonstrate self-control will improve ?Outcome: Progressing ?  ?Problem: Safety: ?Goal: Periods of time without injury will increase ?Outcome: Progressing ?  ?

## 2024-05-29 NOTE — Progress Notes (Signed)
   05/29/24 0846  Psych Admission Type (Psych Patients Only)  Admission Status Voluntary  Psychosocial Assessment  Patient Complaints Anxiety;Worrying  Eye Contact Fair  Facial Expression Anxious  Affect Appropriate to circumstance  Speech Logical/coherent  Interaction Assertive  Motor Activity Other (Comment) (WNL)  Appearance/Hygiene Unremarkable  Behavior Characteristics Cooperative  Mood Anxious  Thought Process  Coherency WDL  Content WDL  Delusions None reported or observed  Perception WDL  Hallucination None reported or observed  Judgment Impaired  Confusion None  Danger to Self  Current suicidal ideation? Denies  Self-Injurious Behavior No self-injurious ideation or behavior indicators observed or expressed   Agreement Not to Harm Self Yes  Description of Agreement Verbal  Danger to Others  Danger to Others None reported or observed

## 2024-05-29 NOTE — Progress Notes (Signed)
   05/29/24 2221  Psych Admission Type (Psych Patients Only)  Admission Status Voluntary  Psychosocial Assessment  Patient Complaints Anxiety  Eye Contact Fair  Facial Expression Anxious  Affect Appropriate to circumstance  Speech Logical/coherent  Interaction Assertive  Motor Activity Other (Comment) (WDL)  Appearance/Hygiene Unremarkable  Behavior Characteristics Appropriate to situation  Mood Pleasant  Thought Process  Coherency WDL  Content WDL  Delusions None reported or observed  Perception WDL  Hallucination None reported or observed  Judgment Impaired  Confusion None  Danger to Self  Current suicidal ideation? Denies  Self-Injurious Behavior No self-injurious ideation or behavior indicators observed or expressed   Agreement Not to Harm Self Yes  Description of Agreement verbal  Danger to Others  Danger to Others None reported or observed

## 2024-05-30 MED ORDER — FLUOXETINE HCL 10 MG PO CAPS
10.0000 mg | ORAL_CAPSULE | Freq: Every day | ORAL | Status: DC
  Administered 2024-05-30 – 2024-05-31 (×2): 10 mg via ORAL
  Filled 2024-05-30 (×2): qty 1

## 2024-05-30 MED ORDER — MIRTAZAPINE 7.5 MG PO TABS
7.5000 mg | ORAL_TABLET | Freq: Every day | ORAL | Status: DC
  Administered 2024-05-30: 7.5 mg via ORAL
  Filled 2024-05-30: qty 1

## 2024-05-30 MED ORDER — BUPRENORPHINE HCL-NALOXONE HCL 8-2 MG SL SUBL
1.0000 | SUBLINGUAL_TABLET | Freq: Once | SUBLINGUAL | Status: AC
  Administered 2024-05-30: 1 via SUBLINGUAL
  Filled 2024-05-30: qty 1

## 2024-05-30 NOTE — [facility] Group Notes (Signed)
 Psychoeducational Group Note  Date:  05/30/2024 Time:  2015  Group Topic/Focus:  Wrap up group  Participation Level: Did Not Attend  Participation Quality:  Not Applicable  Affect:  Not Applicable  Cognitive:  Not Applicable  Insight:  Not Applicable  Engagement in Group: Not Applicable  Additional Comments:  Did not attend.   Lenora Manuelita RAMAN 05/30/2024, 10:39 PM

## 2024-05-30 NOTE — Progress Notes (Signed)
   05/30/24 0900  Psych Admission Type (Psych Patients Only)  Admission Status Voluntary  Psychosocial Assessment  Patient Complaints Anxiety  Eye Contact Fair  Facial Expression Anxious  Affect Appropriate to circumstance  Speech Logical/coherent  Interaction Assertive  Motor Activity Other (Comment) (WNL)  Appearance/Hygiene Unremarkable  Behavior Characteristics Appropriate to situation  Mood Anxious;Pleasant  Thought Process  Coherency WDL  Content WDL  Delusions None reported or observed  Perception WDL  Hallucination None reported or observed  Judgment Impaired  Confusion None  Danger to Self  Current suicidal ideation? Denies  Agreement Not to Harm Self Yes  Description of Agreement Verbal  Danger to Others  Danger to Others None reported or observed

## 2024-05-30 NOTE — Progress Notes (Signed)
 [facility] MD Progress Note  05/30/2024 12:55 PM Jeremiah Newman  MRN:  [id number] Subjective:  Chart reviewed. No significant events overnight. Patient slept well last night. He reports a stable mood and is looking forward to discharge on Monday to [facility]. He reports experiencing more opioid cravings since decreasing suboxone  and reports ruminative worries that have been difficult to manage. He reports that fluoxetine has been helpful in the past and he would like to restart this. He denies SI. No other issues or concerns today.    Principal Problem: Substance-induced psychotic disorder (HCC) Diagnosis: Principal Problem:   Substance-induced psychotic disorder (HCC) Active Problems:   Mood disorder (HCC)   Opioid use disorder   Current Medications: Current Facility-Administered Medications  Medication Dose Route Frequency Provider Last Rate Last Admin   acetaminophen  (TYLENOL ) tablet 650 mg  650 mg Oral Q6H PRN [name], [name], PMHNP   650 mg at 05/29/24 2106   alum & mag hydroxide-simeth (MAALOX/MYLANTA) 200-200-20 MG/5ML suspension 30 mL  30 mL Oral Q4H PRN [name], [name], PMHNP       buprenorphine -naloxone  (SUBOXONE ) 8-2 mg per SL tablet 1 tablet  1 tablet Sublingual Daily Laylonie Marzec A, DO   1 tablet at 05/30/24 9247   haloperidol  (HALDOL ) tablet 5 mg  5 mg Oral TID PRN [name], [name], PMHNP       And   diphenhydrAMINE  (BENADRYL ) capsule 50 mg  50 mg Oral TID PRN [name], [name], PMHNP   50 mg at 05/25/24 2103   haloperidol  lactate (HALDOL ) injection 5 mg  5 mg Intramuscular TID PRN [name], [name], PMHNP       And   diphenhydrAMINE  (BENADRYL ) injection 50 mg  50 mg Intramuscular TID PRN [name], [name], PMHNP       And   LORazepam  (ATIVAN ) injection 2 mg  2 mg Intramuscular TID PRN [name], [name], PMHNP       haloperidol  lactate (HALDOL ) injection 10 mg  10 mg Intramuscular TID PRN [name], [name], PMHNP        And   diphenhydrAMINE  (BENADRYL ) injection 50 mg  50 mg Intramuscular TID PRN [name], [name], PMHNP       And   LORazepam  (ATIVAN ) injection 2 mg  2 mg Intramuscular TID PRN [name], [name], PMHNP       hydrOXYzine  (ATARAX ) tablet 50 mg  50 mg Oral Q6H PRN [name], [name], MD   50 mg at 05/29/24 2105   ibuprofen  (ADVIL ) tablet 600 mg  600 mg Oral Q8H PRN [name], [name], PMHNP       lithium  carbonate (LITHOBID ) ER tablet 300 mg  300 mg Oral QHS Dreshaun Stene A, DO   300 mg at 05/29/24 2105   magnesium  hydroxide (MILK OF MAGNESIA) suspension 30 mL  30 mL Oral Daily PRN [name], [name], PMHNP   30 mL at 05/24/24 0823   melatonin tablet 3 mg  3 mg Oral QHS [name], [name], PMHNP   3 mg at 05/29/24 2106   mirtazapine  (REMERON ) tablet 15 mg  15 mg Oral QHS [name], [name], MD   15 mg at 05/29/24 2105   nicotine  polacrilex (NICORETTE ) gum 4 mg  4 mg Oral PRN Prentis Kitchens A, DO   4 mg at 05/30/24 1004   polyethylene glycol (MIRALAX  / GLYCOLAX ) packet 17 g  17 g Oral Daily [name], [name], MD   17 g at 05/28/24 0808   risperiDONE  (RISPERDAL ) tablet 1 mg  1  mg Oral QHS Prentis Kitchens A, DO   1 mg at 05/29/24 2106   senna-docusate (Senokot-S) tablet 2 tablet  2 tablet Oral Q12H [name], [name], MD   2 tablet at 05/30/24 9247    Lab Results:  No results found for this or any previous visit (from the past 48 hours).   Blood Alcohol level:  Lab Results  Component Value Date   [facility] <15 05/21/2024   ETH <10 01/30/2024    Metabolic Disorder Labs: Lab Results  Component Value Date   HGBA1C 4.7 (L) 12/23/2023   MPG 88.19 12/23/2023   MPG 93.93 10/10/2023   No results found for: PROLACTIN Lab Results  Component Value Date   CHOL 215 (H) 12/23/2023   TRIG 297 (H) 12/23/2023   HDL 40 (L) 12/23/2023   CHOLHDL 5.4 12/23/2023   VLDL 59 (H) 12/23/2023   LDLCALC 116 (H) 12/23/2023   LDLCALC 146 (H) 10/10/2023      Physical Exam: Physical  Exam Vitals and nursing note reviewed.    Musculoskeletal: Normal gait and station  Mental Status Exam: Appearance - casually dressed, appropriate hygiene, many tatoos Behavior - no psychomotor agitation Attitude - Pleasant, cooperative Mood - Good Affect - mildly restricted, but reactive Thought Process - Linear, GD Thought Content - does not endorse any clear paranoid ideation SI/HI - passive thoughts of being better off dead Perceptions - Denies AH; not RIS Judgement/Insight - Poor Fund of knowledge - WNL Language - No impairments   ROS Blood pressure 121/71, pulse (!) 57, temperature 98.2 F (36.8 C), temperature source Oral, resp. rate 17, height 5' 7 (1.702 m), weight 86.6 kg, SpO2 100%. Body mass index is 29.91 kg/m.   Assessment and Plan: Jeremiah Newman is a 33 y/o male with a history of polysubstance use, opioid use disorder managed with buprenorphine , and a prior diagnosis of schizophrenia/schizoaffective disorder, bipolar type, admitted for suicidal ideation in the context of acute opioid withdrawal, expressed paranoia, and increasing symptoms of depression and anxiety. He is currently homeless and unemployed.   6/24 - Mild improvement, though patient reports continuing anxiety and passive SI. Still seeking SUD treatment, which is recommended at this time. No change in medications. Lithium  level tomorrow.   6/25 - Patient has been insisting over the past two days that he does not have a psychotic disorder and describes anxiety and depressive symptoms rather than paranoia. He more likely seems to have experienced a substance induced psychotic disorder in the past. His diagnosis will be revised.   6/26 - Patient presents with stable mood. He is medically appropriate for discharge at this time, but remains in the hospital due to high likelihood for SUD relapse if not discharged directly to an SUD treatment program. At this time discharge is planned for Monday at the  latest, without an accepting facility.  6/27 - Doing well. Tapering off risperidone  and lithium  due to lack of indication. Plan for DC Monday to [facility].   6/28 - Increase Suboxone  back to 16/4 mg daily due to cravings. Start fluoxetine 10 mg daily then increase to 20 mg daily tomorrow. Stop Lithium . Decrease mirtazapine  due to weight gain concerns.   # Substance induced psychotic disorder, resolved - Revised from schizoaffective disorder, bipolar type. Current presentation is more consistent with a chronic/repeated substance induced psychotic disorder.  - Risperidone  1 mg at bedtime. Plan to lower further. - Stop lithium   # Opioid use disorder / hx of polysubstance abuse - Suboxone  16/4 mg SL  daily - Referrals to inpatient SUD programs. This would be the ideal disposition. Otherwise an intensive outpatient SUD program would be appropriate  # Depression and anxiety with suicidal ideation - Suspect these symptoms are related to psychosocial stressors, homelessness, drug use and withdrawal, and psychotic symptoms.  -  mirtazapine  7.5 mg at bedtime.  - Fluoxetine 10 mg daily  # Disposition - Estimated discharge by  6/30  Jeremiah DELENA Salmon, DO 05/30/2024, 12:55 PM

## 2024-05-30 NOTE — Group Note (Signed)
 [facility] LCSW Group Therapy Note  @TD @  10:00-11:00AM Type of Therapy and Topic:  Group Therapy:  Safety Participation Level:  Active   Description of Group:  Patients in this group were introduced the patient's safety;  the treatment  revolves around one central idea; you need to stay safe. The patient will learn on how to cope safely, no matter what negative life events come their way. The patient will identify methods on how they cope safely vs unsafe.  The patient will understand the three stages of healing: Safety, Mourning and Reconnection.  Patients discussed what additional healthy supports could be helpful in their recovery and wellness after discharge in order to prevent future hospitalizations.   An emphasis was placed on following up with the discharge plan when they leave the hospital to continue becoming healthier and happier.   Therapeutic Goals: 1)  Understand the patient's safety  2)  Stages of Safety  3)  Safe Vs. Unsafe   4) Support   Summary of Patient Progress:  The patient expressed his comprehension of the concepts presented, and that he feel safe when he trust in God.  Current supports include families and friends.  The patient indicated one healthy support that could be helpful if added would be believe in God.  He participated committed to a work sheet and sharing his thoughts.  Therapeutic Modalities:   Psychoeducation Brief Solution-Focused Therapy  Makalya Nave O Kammy Klett, LCSWA 05/30/2024  3:02 PM

## 2024-05-30 NOTE — Progress Notes (Signed)
   05/30/24 0557  15 Minute Checks  Location Bedroom  Visual Appearance Calm  Behavior Sleeping  OTHER  Documented sleep last 24 hours 7.75  Sleep (Behavioral Health Patients Only)  Calculate sleep? (Click Yes once per 24 hr at 0600 safety check) Yes

## 2024-05-30 NOTE — Progress Notes (Signed)
   05/30/24 2132  Psych Admission Type (Psych Patients Only)  Admission Status Voluntary  Psychosocial Assessment  Patient Complaints Anxiety  Eye Contact Fair  Facial Expression Anxious  Affect Appropriate to circumstance  Speech Logical/coherent  Interaction Assertive  Motor Activity Other (Comment) (WDL)  Appearance/Hygiene Unremarkable  Behavior Characteristics Appropriate to situation  Mood Anxious  Thought Process  Coherency WDL  Content WDL  Delusions None reported or observed  Perception WDL  Hallucination None reported or observed  Judgment Impaired  Confusion None  Danger to Self  Current suicidal ideation? Denies  Self-Injurious Behavior No self-injurious ideation or behavior indicators observed or expressed   Agreement Not to Harm Self Yes  Description of Agreement verbal  Danger to Others  Danger to Others None reported or observed

## 2024-05-30 NOTE — Plan of Care (Signed)
   Problem: Education: Goal: Mental status will improve Outcome: Progressing   Problem: Activity: Goal: Interest or engagement in activities will improve Outcome: Progressing

## 2024-05-30 NOTE — Plan of Care (Signed)
 ?  Problem: Activity: ?Goal: Interest or engagement in activities will improve ?Outcome: Progressing ?Goal: Sleeping patterns will improve ?Outcome: Progressing ?  ?Problem: Coping: ?Goal: Ability to verbalize frustrations and anger appropriately will improve ?Outcome: Progressing ?Goal: Ability to demonstrate self-control will improve ?Outcome: Progressing ?  ?Problem: Safety: ?Goal: Periods of time without injury will increase ?Outcome: Progressing ?  ?

## 2024-05-31 MED ORDER — BUPRENORPHINE HCL-NALOXONE HCL 8-2 MG SL SUBL
1.0000 | SUBLINGUAL_TABLET | Freq: Once | SUBLINGUAL | Status: AC
  Administered 2024-05-31: 1 via SUBLINGUAL
  Filled 2024-05-31: qty 1

## 2024-05-31 MED ORDER — BUPRENORPHINE HCL-NALOXONE HCL 8-2 MG SL SUBL
2.0000 | SUBLINGUAL_TABLET | Freq: Every day | SUBLINGUAL | Status: DC
  Administered 2024-06-01: 2 via SUBLINGUAL
  Filled 2024-05-31: qty 2

## 2024-05-31 MED ORDER — TRAZODONE HCL 50 MG PO TABS
50.0000 mg | ORAL_TABLET | Freq: Every evening | ORAL | Status: DC | PRN
  Administered 2024-05-31: 50 mg via ORAL
  Filled 2024-05-31: qty 1

## 2024-05-31 MED ORDER — FLUOXETINE HCL 20 MG PO CAPS
20.0000 mg | ORAL_CAPSULE | Freq: Every day | ORAL | Status: DC
  Administered 2024-06-01: 20 mg via ORAL
  Filled 2024-05-31: qty 1

## 2024-05-31 NOTE — Group Note (Signed)
 Date:  05/31/2024 Time:  5:18 PM  Group Topic/Focus:  Goals Group:   The focus of this group is to help patients establish daily goals to achieve during treatment and discuss how the patient can incorporate goal setting into their daily lives to aide in recovery. Orientation:   The focus of this group is to educate the patient on the purpose and policies of crisis stabilization and provide a format to answer questions about their admission.  The group details unit policies and expectations of patients while admitted.    Participation Level:  Did Not Attend   Jeremiah Newman 05/31/2024, 5:18 PM

## 2024-05-31 NOTE — Plan of Care (Signed)
   Problem: Education: Goal: Emotional status will improve Outcome: Progressing Goal: Mental status will improve Outcome: Progressing   Problem: Activity: Goal: Interest or engagement in activities will improve Outcome: Progressing Goal: Sleeping patterns will improve Outcome: Progressing

## 2024-05-31 NOTE — Progress Notes (Signed)
   05/31/24 2136  Psych Admission Type (Psych Patients Only)  Admission Status Voluntary  Psychosocial Assessment  Patient Complaints None  Eye Contact Fair  Facial Expression Animated  Affect Appropriate to circumstance  Speech Logical/coherent  Interaction Assertive  Motor Activity Other (Comment) (WDL)  Appearance/Hygiene Unremarkable  Behavior Characteristics Appropriate to situation  Mood Pleasant  Thought Process  Coherency WDL  Content WDL  Delusions None reported or observed  Perception WDL  Hallucination None reported or observed  Judgment Impaired  Confusion None  Danger to Self  Current suicidal ideation? Denies  Self-Injurious Behavior No self-injurious ideation or behavior indicators observed or expressed   Agreement Not to Harm Self Yes  Description of Agreement verbal  Danger to Others  Danger to Others None reported or observed

## 2024-05-31 NOTE — Progress Notes (Addendum)
 Patient denies SI/HI/AVH this morning. Pt has been interactive on the unit and participating in groups throughout the day. Pt has been calm and cooperative throughout the day. Patient has been compliant with medications and treatment plan. Q 15 minute safety checks are in place for patient's safety. Patient is currently safe on the unit.   05/31/24 0815  Psych Admission Type (Psych Patients Only)  Admission Status Voluntary  Psychosocial Assessment  Patient Complaints None  Eye Contact Fair  Facial Expression Animated  Affect Appropriate to circumstance  Speech Logical/coherent  Interaction Assertive  Motor Activity Other (Comment) (WDL)  Appearance/Hygiene Unremarkable  Behavior Characteristics Cooperative;Appropriate to situation  Mood Pleasant  Thought Process  Coherency WDL  Content WDL  Delusions None reported or observed  Perception WDL  Hallucination None reported or observed  Judgment Impaired  Confusion None  Danger to Self  Current suicidal ideation? Denies  Description of Suicide Plan No plan  Self-Injurious Behavior No self-injurious ideation or behavior indicators observed or expressed   Agreement Not to Harm Self Yes  Description of Agreement Verbal  Danger to Others  Danger to Others None reported or observed

## 2024-05-31 NOTE — Progress Notes (Signed)
 [facility] MD Progress Note  05/31/2024 3:53 PM Jeremiah Newman  MRN:  [id number] Subjective:  Chart reviewed. No significant events overnight. Patient slept well last night. He has been med compliant and behaviorally appropriate. He is anticipating discharge to [facility]. Reports control of opioid cravings with buprenorphine  16/4 mg daily. Denies any increase in mood or anxiety symptoms since decreasing lithium  and risperidone . Tolerating fluoxetine thus far. Denies SI. He has been active in the milieu and socializing with peers.    Principal Problem: Substance-induced psychotic disorder (HCC) Diagnosis: Principal Problem:   Substance-induced psychotic disorder (HCC) Active Problems:   Mood disorder (HCC)   Opioid use disorder   Current Medications: Current Facility-Administered Medications  Medication Dose Route Frequency Provider Last Rate Last Admin   acetaminophen  (TYLENOL ) tablet 650 mg  650 mg Oral Q6H PRN [name], [name], PMHNP   650 mg at 05/29/24 2106   alum & mag hydroxide-simeth (MAALOX/MYLANTA) 200-200-20 MG/5ML suspension 30 mL  30 mL Oral Q4H PRN [name], [name], PMHNP       [START ON 06/01/2024] buprenorphine -naloxone  (SUBOXONE ) 8-2 mg per SL tablet 2 tablet  2 tablet Sublingual Daily Sherri Levenhagen A, DO       haloperidol  (HALDOL ) tablet 5 mg  5 mg Oral TID PRN [name], [name], PMHNP       And   diphenhydrAMINE  (BENADRYL ) capsule 50 mg  50 mg Oral TID PRN [name], [name], PMHNP   50 mg at 05/25/24 2103   haloperidol  lactate (HALDOL ) injection 5 mg  5 mg Intramuscular TID PRN [name], [name], PMHNP       And   diphenhydrAMINE  (BENADRYL ) injection 50 mg  50 mg Intramuscular TID PRN [name], [name], PMHNP       And   LORazepam  (ATIVAN ) injection 2 mg  2 mg Intramuscular TID PRN [name], [name], PMHNP       haloperidol  lactate (HALDOL ) injection 10 mg  10 mg Intramuscular TID PRN [name], [name],  PMHNP       And   diphenhydrAMINE  (BENADRYL ) injection 50 mg  50 mg Intramuscular TID PRN [name], [name], PMHNP       And   LORazepam  (ATIVAN ) injection 2 mg  2 mg Intramuscular TID PRN [name], [name], PMHNP       FLUoxetine (PROZAC) capsule 10 mg  10 mg Oral Daily Keshawn Fiorito A, DO   10 mg at 05/31/24 0756   hydrOXYzine  (ATARAX ) tablet 50 mg  50 mg Oral Q6H PRN [name], [name], MD   50 mg at 05/30/24 2109   ibuprofen  (ADVIL ) tablet 600 mg  600 mg Oral Q8H PRN [name], [name], PMHNP       magnesium  hydroxide (MILK OF MAGNESIA) suspension 30 mL  30 mL Oral Daily PRN [name], [name], PMHNP   30 mL at 05/24/24 0823   melatonin tablet 3 mg  3 mg Oral QHS [name], [name], PMHNP   3 mg at 05/30/24 2110   mirtazapine  (REMERON ) tablet 7.5 mg  7.5 mg Oral QHS Kynlee Koenigsberg A, DO   7.5 mg at 05/30/24 2109   nicotine  polacrilex (NICORETTE ) gum 4 mg  4 mg Oral PRN Prentis Kitchens A, DO   4 mg at 05/31/24 1238   polyethylene glycol (MIRALAX  / GLYCOLAX ) packet 17 g  17 g Oral Daily [name], [name], MD   17 g at 05/28/24 9191   risperiDONE  (RISPERDAL ) tablet 1 mg  1 mg Oral QHS Prentis Kitchens LABOR,  DO   1 mg at 05/30/24 2110   senna-docusate (Senokot-S) tablet 2 tablet  2 tablet Oral Q12H [name], [name], MD   2 tablet at 05/31/24 0756    Lab Results:  No results found for this or any previous visit (from the past 48 hours).   Blood Alcohol level:  Lab Results  Component Value Date   [facility] <15 05/21/2024   ETH <10 01/30/2024    Metabolic Disorder Labs: Lab Results  Component Value Date   HGBA1C 4.7 (L) 12/23/2023   MPG 88.19 12/23/2023   MPG 93.93 10/10/2023   No results found for: PROLACTIN Lab Results  Component Value Date   CHOL 215 (H) 12/23/2023   TRIG 297 (H) 12/23/2023   HDL 40 (L) 12/23/2023   CHOLHDL 5.4 12/23/2023   VLDL 59 (H) 12/23/2023   LDLCALC 116 (H) 12/23/2023   LDLCALC 146 (H) 10/10/2023      Physical Exam Vitals and  nursing note reviewed.    Musculoskeletal: Normal gait and station  Mental Status Exam: Appearance - casually dressed, appropriate hygiene, many tatoos Behavior - no psychomotor agitation Attitude - Pleasant, cooperative Mood - Good Affect - Full Thought Process - Linear, GD Thought Content - does not endorse any clear paranoid ideation SI/HI - passive thoughts of being better off dead Perceptions - Denies AH; not RIS Judgement/Insight - Poor Fund of knowledge - WNL Language - No impairments    Blood pressure 102/67, pulse (!) 56, temperature 98 F (36.7 C), temperature source Oral, resp. rate 16, height 5' 7 (1.702 m), weight 86.6 kg, SpO2 99%. Body mass index is 29.91 kg/m.   Assessment and Plan: Mr. Jeremiah Newman is a 33 y/o male with a history of polysubstance use, opioid use disorder managed with buprenorphine , and a prior diagnosis of schizophrenia/schizoaffective disorder, bipolar type, admitted for suicidal ideation in the context of acute opioid withdrawal, expressed paranoia, and increasing symptoms of depression and anxiety. He is currently homeless and unemployed.   6/24 - Mild improvement, though patient reports continuing anxiety and passive SI. Still seeking SUD treatment, which is recommended at this time. No change in medications. Lithium  level tomorrow.   6/25 - Patient has been insisting over the past two days that he does not have a psychotic disorder and describes anxiety and depressive symptoms rather than paranoia. He more likely seems to have experienced a substance induced psychotic disorder in the past. His diagnosis will be revised.   6/26 - Patient presents with stable mood. He is medically appropriate for discharge at this time, but remains in the hospital due to high likelihood for SUD relapse if not discharged directly to an SUD treatment program. At this time discharge is planned for Monday at the latest, without an accepting facility.  6/27 -  Doing well. Tapering off risperidone  and lithium  due to lack of indication. Plan for DC Monday to [facility].   6/28 - Increase Suboxone  back to 16/4 mg daily due to cravings. Start fluoxetine 10 mg daily then increase to 20 mg daily tomorrow. Stop Lithium . Decrease mirtazapine  due to weight gain concerns.   6/29 - Patient stable and ready for DC tomorrow once transportation arranged. Stop risperidone  and mirtazapine . Increase fluoxetine to 20 mg daily.   # Substance induced psychotic disorder, resolved - Revised from schizoaffective disorder, bipolar type. Current presentation is more consistent with a chronic/repeated substance induced psychotic disorder.  - Stop risperidone  - Stop lithium   # Opioid use disorder / hx of polysubstance  abuse - Suboxone  16/4 mg SL daily - Referrals to inpatient SUD programs. This would be the ideal disposition. Otherwise an intensive outpatient SUD program would be appropriate  # Depression and anxiety with suicidal ideation - Suspect these symptoms are related to psychosocial stressors, homelessness, drug use and withdrawal, and psychotic symptoms.  - Fluoxetine 20 mg daily  # Disposition - Estimated discharge by  6/30  Oliva DELENA Salmon, DO 05/31/2024, 3:53 PM

## 2024-05-31 NOTE — Plan of Care (Signed)
  Problem: Education: Goal: Mental status will improve Outcome: Progressing   Problem: Activity: Goal: Sleeping patterns will improve Outcome: Progressing   

## 2024-05-31 NOTE — Progress Notes (Signed)
   05/31/24 0530  15 Minute Checks  Location Bedroom  Visual Appearance Calm  Behavior Sleeping  OTHER  Documented sleep last 24 hours 7.75  Sleep (Behavioral Health Patients Only)  Calculate sleep? (Click Yes once per 24 hr at 0600 safety check) Yes

## 2024-06-01 MED ORDER — BUPRENORPHINE HCL-NALOXONE HCL 8-2 MG SL SUBL: 2.0000 | SUBLINGUAL_TABLET | Freq: Every day | SUBLINGUAL | 0 refills | Status: DC

## 2024-06-01 MED ORDER — FLUOXETINE HCL 20 MG PO CAPS: 20.0000 mg | ORAL_CAPSULE | Freq: Every day | ORAL | 0 refills | Status: DC

## 2024-06-01 MED ORDER — BUPRENORPHINE HCL-NALOXONE HCL 8-2 MG SL FILM: 2.0000 | ORAL_FILM | Freq: Every day | SUBLINGUAL | 1 refills | Status: AC

## 2024-06-01 MED ORDER — FLUOXETINE HCL 20 MG PO CAPS
20.0000 mg | ORAL_CAPSULE | Freq: Every day | ORAL | 0 refills | Status: DC
Start: 2024-06-02 — End: 2024-10-13

## 2024-06-01 NOTE — Progress Notes (Signed)
   06/01/24 0506  15 Minute Checks  Location Bedroom  Visual Appearance Calm  Behavior Sleeping  OTHER  Documented sleep last 24 hours 7.5  Sleep (Behavioral Health Patients Only)  Calculate sleep? (Click Yes once per 24 hr at 0600 safety check) Yes

## 2024-06-01 NOTE — Progress Notes (Signed)
  [facility] Adult Case Management Discharge Plan :  Will you be returning to the same living situation after discharge:  No. At discharge, do you have transportation home?: Yes,  CSW arranged [facility] for [address] to [address] (physical address is [address] - [address]). Safe transport aware of this. D/c to [facility]  Do you have the ability to pay for your medications: Yes,  pt has active health insurance coverage.  Release of information consent forms completed and in the chart;  Patient's signature needed at discharge.  Patient to Follow up at:  Follow-up Information     [facility] Follow up on 06/02/2024.   Why: Please call this provider on 06/02/24 at 9:00 am to schedule a hospital follow up appointment for therapy and medication management services.  It will be a Virtual appointment. Contact information: [address]  [address] [place] [address] [phone]                 Next level of care provider has access to [facility] Link:no  Safety Planning and Suicide Prevention discussed: Yes,  completed with patient at discharge     Has patient been referred to the Quitline?: Patient refused referral for treatment  Patient has been referred for addiction treatment: Pt discharging to [facility] [place], LCSWA 06/01/2024, 8:45 AM

## 2024-06-01 NOTE — [facility] Suicide Risk Assessment (Signed)
 [facility] Discharge Suicide Risk Assessment   Principal Problem: Substance-induced psychotic disorder [facility]) Discharge Diagnoses: Principal Problem:   Substance-induced psychotic disorder (HCC) Active Problems:   Mood disorder (HCC)   Opioid use disorder   Total Time spent with patient: 45 minutes  Musculoskeletal: Normal gait and station  Psychiatric Specialty Exam  Presentation  General Appearance:  Appropriate for Environment  Eye Contact: Fair  Speech: Clear and Coherent  Speech Volume: Normal  Handedness: Right   Mood and Affect  Mood: Euthymic  Duration of Depression Symptoms: Greater than two weeks  Affect: Appropriate   Thought Process  Thought Processes: Coherent  Descriptions of Associations:Intact  Orientation:Full (Time, Place and Person)  Thought Content:Logical  Hallucinations: None Ideas of Reference:None  Suicidal Thoughts: Denies Homicidal Thoughts: Denies  Sensorium  Memory: Immediate Fair  Judgment: Fair  Insight: Fair   Art therapist  Concentration: Fair  Attention Span: Fair  Recall: [employer] of Knowledge: Fair  Language: Fair   Psychomotor Activity  Psychomotor Activity:No data recorded  Assets  Assets: Communication Skills; Desire for Improvement; Financial Resources/Insurance; Housing; Resilience  Physical Exam ROS Blood pressure 107/60, pulse (!) 49, temperature 98.4 F (36.9 C), temperature source Oral, resp. rate 16, height 5' 7 (1.702 m), weight 86.6 kg, SpO2 99%. Body mass index is 29.91 kg/m.  Mental Status Per Nursing Assessment::   On Admission:  Self-harm thoughts  Demographic Factors:  Male and Caucasian  Loss Factors: Financial problems/change in socioeconomic status  Historical Factors: Impulsivity and NA  Risk Reduction Factors:   Positive coping skills or problem solving skills  Continued Clinical Symptoms:  Substance abuse  Cognitive Features That Contribute  To Risk:  None    Suicide Risk:  Mild:  Suicidal ideation of limited frequency, intensity, duration, and specificity.  There are no identifiable plans, no associated intent, mild dysphoria and related symptoms, good self-control (both objective and subjective assessment), few other risk factors, and identifiable protective factors, including available and accessible social support.   Follow-up Information     [facility]. Schedule an appointment as soon as possible for a visit.   Why: You may call this provider to schedule a hospital follow up appointment for therapy and medication management services. Contact information: [address]  [address] [place] [address] [phone]         [facility] Follow up.   Specialty: Addiction Medicine Why: Referral made Contact information: [address] [place] [address] [phone]                 Plan Of Care/Follow-up recommendations: See DC summary   Oliva DELENA Salmon, DO 06/01/2024, 8:43 AM

## 2024-06-01 NOTE — [facility] Suicide Risk Assessment (Signed)
 [facility] INPATIENT:  Family/Significant Other Suicide Prevention Education  Suicide Prevention Education:  Suicide Prevention Education was reviewed thoroughly with patient, including risk factors, warning signs, and what to do.  Mobile Crisis services were described and that telephone number pointed out, with encouragement to patient to put this number in personal cell phone.  Brochure was provided to patient to share with natural supports.  Patient acknowledged the ways in which they are at risk, and how working through each of their issues can gradually start to reduce their risk factors.  Patient was encouraged to think of the information in the context of people in their own lives.  Patient denied having access to firearms  Patient verbalized understanding of information provided.  Patient endorsed a desire to live.

## 2024-06-01 NOTE — Group Note (Signed)
 Date:  06/01/2024 Time:  12:59 AM  Group Topic/Focus:  Wrap-Up Group:   The focus of this group is to help patients review their daily goal of treatment and discuss progress on daily workbooks.    Participation Level:  Active  Participation Quality:  Sharing  Affect:  Appropriate  Cognitive:  Appropriate  Insight: Good  Engagement in Group:  Engaged  Modes of Intervention:  Discussion  Additional Comments:  Patient shared his day overall was a 7. His goal for today was to bring peace. The highlight of his day was the prayer held amongst his peers.   Kristen H Rebeka Kimble 06/01/2024, 12:59 AM

## 2024-06-01 NOTE — Progress Notes (Signed)
 Pt discharged to lobby. Pt was stable and appreciative at that time. All papers and prescriptions were given and valuables returned. Verbal understanding expressed. Denies SI/HI and A/VH. Pt given opportunity to express concerns and ask questions.

## 2024-06-01 NOTE — Plan of Care (Signed)
  Problem: Coping: Goal: Ability to demonstrate self-control will improve Outcome: Progressing   Problem: Health Behavior/Discharge Planning: Goal: Identification of resources available to assist in meeting health care needs will improve Outcome: Progressing

## 2024-06-01 NOTE — Transportation (Signed)
 06/01/2024  Jeremiah Newman DOB: 1991-06-22 MRN: [id number]   RIDER WAIVER AND RELEASE OF LIABILITY  For the purposes of helping with transportation needs, [facility] partners with outside transportation providers (taxi companies, [place], Catering manager.) to give [facility] patients or other approved people the choice of on-demand rides Public librarian) to our buildings for non-emergency visits.  By using [employer], I, the person signing this document, on behalf of myself and/or any legal minors (in my care using the [employer]), agree:  Science writer given to me are supplied by independent, outside transportation providers who do not work for, or have any affiliation with, [employer]. [place] is not a transportation company. [place] has no control over the quality or safety of the rides I get using [employer]. [facility] has no control over whether any outside ride will happen on time or not. [place] gives no guarantee on the reliability, quality, safety, or availability on any rides, or that no mistakes will happen. I know and accept that traveling by vehicle (car, truck, SVU, fleeta, bus, taxi, etc.) has risks of serious injuries such as disability, being paralyzed, and death. I know and agree the risk of using [employer] is mine alone, and not [employer]. [employer] are provided as is and as are available. The transportation providers are in charge for all inspections and care of the vehicles used to provide these rides. I agree not to take legal action against [facility], its agents, employees, officers, directors, representatives, insurers, attorneys, assigns, successors, subsidiaries, and affiliates at any time for any reasons related directly or indirectly to using [employer]. I also agree not to take legal action against [place] or its affiliates for any injury, death, or damage to property caused by or related to using  [employer]. I have read this Waiver and Release of Liability, and I understand the terms used in it and their legal meaning. This Waiver is freely and voluntarily given with the understanding that my right (or any legal minors) to legal action against [place] relating to [employer] is knowingly given up to use these services.   I attest that I read the Ride Waiver and Release of Liability to Jeremiah Newman, gave Mr. Chapdelaine the opportunity to ask questions and answered the questions asked (if any). I affirm that Jeremiah Newman then provided consent for assistance with transportation.

## 2024-06-02 NOTE — Discharge Summary (Signed)
 Physician Discharge Summary Note  Patient:  Jeremiah Newman is an 33 y.o., male MRN:  [id number] DOB:  1991/10/10 Patient phone:  [phone] (home)  Patient address:   [place] [address],  Total Time spent with patient: 45 minutes  Date of Admission:  05/21/2024 Date of Discharge: 06/01/2024  Reason for Admission:  Mr. Jeremiah Newman is a 33 y/o male with a history of polysubstance use, opioid use disorder managed with buprenorphine , and a prior diagnosis of schizophrenia/schizoaffective disorder, bipolar type, admitted for suicidal ideation in the context of acute opioid withdrawal, expressed paranoia, and increasing symptoms of depression and anxiety.  Please see the H&P dated 05/22/2024 by Dr. [name] for further information.  Principal Problem: Substance-induced psychotic disorder [facility]) Discharge Diagnoses: Principal Problem:   Substance-induced psychotic disorder (HCC) Active Problems:   Mood disorder (HCC)   Opioid use disorder   Hospital Course: The patient was admitted voluntarily to inpatient psychiatry.  His outpatient risperidone  2 mg twice daily and lithium  300 mg twice daily was continued upon admission, and mirtazapine  15 mg nightly was added as well.  Prazosin  2 mg nightly was held due to blood pressure concerns.  Of note the patient's UDS was negative at the time of admission.  Given that the patient had a history of opioid use disorder he was started on Suboxone  and titrated up to a dose of 16/4 mg sublingually daily which resulted in good control of opioid cravings and no withdrawal symptoms.  Over the ensuing days the patient stabilized, reporting a resolution of suicidal ideation, he denied any AVH, and he reported an improvement in mood and a decrease anxiety.  The patient expressed an interest to transfer to an inpatient substance use program and worked diligently throughout this admission at researching and finding available facilities.  He was ultimately able  to secure admission to an [facility] in [place] and was transferred directly at time of discharge.  During this admission the patient did not endorse significant psychotic symptoms, and stated that he was skeptical as to the veracity of the diagnosis of schizoaffective disorder.  Upon further discussion of the symptoms he reported that he did not believe that he experienced symptoms of hallucinations or paranoia in the absence of active drug use.  His diagnosis was therefore revised to a substance-induced psychotic disorder, that was resolved during this admission and no longer active.  His diagnoses were additionally modified to an unspecified mood disorder and ongoing opioid use disorder.  As the patient was not felt to have an active psychotic disorder Risperdal  was tapered and discontinued along with lithium .  Mirtazapine  was ultimately discontinued due to the patient having adequate sleep and concerns of her long-term weight gain, and fluoxetine was initiated and titrated to 20 mg daily for reported chronic anxiety.  During this admission the patient was behaviorally appropriate at all times and did not exhibit or engage in any unsafe behaviors.  On the day of discharge he reported euthymic mood, denied suicidal ideation, denied AVH, and was tolerating his medications well without any side effects.  Musculoskeletal: Normal gait and station  Mental Status Exam: Appearance -casually dressed, no apparent distress, good hygiene Attitude -calm, friendly, polite Speech - normal volume, prosody, inflection Mood -good Affect -full Thought Process -LLGD Thought Content -no delusional thought content expressed SI/HI -denies Perceptions -denies AVH, not RIS Judgement/Insight -fair to good Fund of knowledge - WNL Language - No impairments   Assets  Assets: Manufacturing systems engineer; Desire for Improvement; Financial  Resources/Insurance; Housing; Resilience    Physical Exam Vitals and nursing  note reviewed.     Blood pressure 107/60, pulse (!) 49, temperature 98.4 F (36.9 C), temperature source Oral, resp. rate 16, height 5' 7 (1.702 m), weight 86.6 kg, SpO2 99%. Body mass index is 29.91 kg/m.   Social History   Tobacco Use  Smoking Status Some Days   Current packs/day: 1.00   Average packs/day: 1 pack/day for 9.5 years (9.5 ttl pk-yrs)   Types: Cigarettes   Start date: 2016  Smokeless Tobacco Never   Tobacco Cessation:  A prescription for an FDA-approved tobacco cessation medication provided at discharge   Blood Alcohol level:  Lab Results  Component Value Date   [facility] <15 05/21/2024   ETH <10 01/30/2024    Metabolic Disorder Labs:  Lab Results  Component Value Date   HGBA1C 4.7 (L) 12/23/2023   MPG 88.19 12/23/2023   MPG 93.93 10/10/2023   No results found for: PROLACTIN Lab Results  Component Value Date   CHOL 215 (H) 12/23/2023   TRIG 297 (H) 12/23/2023   HDL 40 (L) 12/23/2023   CHOLHDL 5.4 12/23/2023   VLDL 59 (H) 12/23/2023   LDLCALC 116 (H) 12/23/2023   LDLCALC 146 (H) 10/10/2023    See Psychiatric Specialty Exam and Suicide Risk Assessment completed by Attending Physician prior to discharge.  Discharge destination:  Other:  [facility]   Is patient on multiple antipsychotic therapies at discharge:  No    Discharge Instructions     Diet - low sodium heart healthy   Complete by: As directed       Allergies as of 06/01/2024   No Known Allergies      Medication List     STOP taking these medications    buprenorphine  8 MG Subl SL tablet Commonly known as: SUBUTEX    lithium  carbonate 300 MG ER tablet Commonly known as: LITHOBID    mirtazapine  7.5 MG tablet Commonly known as: REMERON    paliperidone  1.5 MG 24 hr tablet Commonly known as: INVEGA    paliperidone  234 MG/1.5ML injection Commonly known as: INVEGA  SUSTENNA   prazosin  2 MG capsule Commonly known as: MINIPRESS    risperiDONE  2 MG tablet Commonly known as:  RISPERDAL    traZODone  100 MG tablet Commonly known as: DESYREL    Vitamin D (Ergocalciferol) 1.25 MG (50000 UNIT) Caps capsule Commonly known as: DRISDOL       TAKE these medications      Indication  Buprenorphine  HCl-Naloxone  HCl 8-2 MG Film Commonly known as: Suboxone  Place 2 Film under the tongue daily for 14 days.  Indication: Opioid Dependence   buprenorphine -naloxone  8-2 mg Subl SL tablet Commonly known as: SUBOXONE  Place 2 tablets under the tongue daily.  Indication: Opioid Dependence   FLUoxetine 20 MG capsule Commonly known as: PROZAC Take 1 capsule (20 mg total) by mouth daily.  Indication: anxiety   FLUoxetine 20 MG capsule Commonly known as: PROZAC Take 1 capsule (20 mg total) by mouth daily.  Indication: anxiety        Follow-up Information     [facility] Follow up on 06/02/2024.   Why: Please call this provider on 06/02/24 at 9:00 am to schedule a hospital follow up appointment for therapy and medication management services.  It will be a Virtual appointment. Contact information: [address]  [address] [place] [address] [phone]                 Follow-up recommendations: Take all medications as prescribed,  attend all follow-up appointments as prescribed, abstain from substance use.    Signed: Oliva DELENA Salmon, DO 06/02/2024, 6:34 PM

## 2024-10-06 ENCOUNTER — Emergency Department
Admission: EM | Admit: 2024-10-06 | Discharge: 2024-10-07 | Disposition: A | Discharge status: Other Acute Inpatient Hospital | Payer: 59 | Attending: Emergency Medicine | Admitting: Emergency Medicine

## 2024-10-06 DIAGNOSIS — F32A Depression, unspecified: Secondary | ICD-10-CM | POA: Insufficient documentation

## 2024-10-06 DIAGNOSIS — R45851 Suicidal ideations: Secondary | ICD-10-CM | POA: Insufficient documentation

## 2024-10-06 DIAGNOSIS — F4310 Post-traumatic stress disorder, unspecified: Secondary | ICD-10-CM | POA: Diagnosis not present

## 2024-10-06 DIAGNOSIS — F259 Schizoaffective disorder, unspecified: Secondary | ICD-10-CM | POA: Diagnosis not present

## 2024-10-06 DIAGNOSIS — F17210 Nicotine dependence, cigarettes, uncomplicated: Secondary | ICD-10-CM | POA: Insufficient documentation

## 2024-10-06 LAB — URINE DRUG SCREEN, QUALITATIVE (ARMC ONLY)
Amphetamines, Ur Screen: NOT DETECTED
Barbiturates, Ur Screen: NOT DETECTED
Benzodiazepine, Ur Scrn: NOT DETECTED
Cannabinoid 50 Ng, Ur ~~LOC~~: NOT DETECTED
Cocaine Metabolite,Ur ~~LOC~~: NOT DETECTED
MDMA (Ecstasy)Ur Screen: NOT DETECTED
Methadone Scn, Ur: NOT DETECTED
Opiate, Ur Screen: NOT DETECTED
Phencyclidine (PCP) Ur S: NOT DETECTED
Tricyclic, Ur Screen: NOT DETECTED

## 2024-10-06 LAB — CBC
HCT: 45.5 % (ref 39.0–52.0)
Hemoglobin: 15.6 g/dL (ref 13.0–17.0)
MCH: 28.8 pg (ref 26.0–34.0)
MCHC: 34.3 g/dL (ref 30.0–36.0)
MCV: 83.9 fL (ref 80.0–100.0)
Platelets: 279 K/uL (ref 150–400)
RBC: 5.42 MIL/uL (ref 4.22–5.81)
RDW: 13.5 % (ref 11.5–15.5)
WBC: 10.3 K/uL (ref 4.0–10.5)
nRBC: 0 % (ref 0.0–0.2)

## 2024-10-06 LAB — COMPREHENSIVE METABOLIC PANEL WITH GFR
ALT: 22 U/L (ref 0–44)
AST: 21 U/L (ref 15–41)
Albumin: 5 g/dL (ref 3.5–5.0)
Alkaline Phosphatase: 84 U/L (ref 38–126)
Anion gap: 10 (ref 5–15)
BUN: 9 mg/dL (ref 6–20)
CO2: 23 mmol/L (ref 22–32)
Calcium: 9.5 mg/dL (ref 8.9–10.3)
Chloride: 106 mmol/L (ref 98–111)
Creatinine, Ser: 0.9 mg/dL (ref 0.61–1.24)
GFR, Estimated: >60 mL/min (ref >60)
Glucose, Bld: 88 mg/dL (ref 70–99)
Potassium: 3.6 mmol/L (ref 3.5–5.1)
Sodium: 139 mmol/L (ref 135–145)
Total Bilirubin: 0.8 mg/dL (ref 0.0–1.2)
Total Protein: 8.2 g/dL — ABNORMAL HIGH (ref 6.5–8.1)

## 2024-10-06 LAB — ETHANOL: Alcohol, Ethyl (B): <15 mg/dL (ref <15)

## 2024-10-06 NOTE — ED Notes (Signed)
 Pt telling this RN is here for help with depression and thoughts of SI. Pt states he moved back here a few days ago, staying with parents who drink. Had two beers with dad and was attempting to inform parents of mental health status but parents did not believe him, decided to take a gun and end his life but could not follow through. Pt then attempted to take sleeping pills but again, could not follow through. Pt asking for help. Pt states he has a six your old child that he had to sign his rights away to while in jail last year, mother died while he was in prison, but he stated that was a reason he could not follow through with SI thoughts.  Pt informed of ER Dr to see pt, and psych team to come by as well. Pt given sprite and warm blanket and asked to let this RN know if any needs/thoughts arise. Pt stating ok with this plan.

## 2024-10-06 NOTE — ED Notes (Signed)

## 2024-10-06 NOTE — ED Notes (Signed)
 White bag with patient label contents below: Black shirt Black pants [place] boxers Black earrings Chief executive officer [employer] 2 bracelets Hat  Black bag from home with belongings.-tagged with patient label

## 2024-10-06 NOTE — ED Triage Notes (Signed)
 PT presents to the ED via POV from home with suicidal ideation. Pt reports extensive mental health hx. Pt states that he was in [place]  going through a program and states that it was going well. States that he decided to come back home to his family and states that it just got everything fire up again. Pt reports that his family drinks and he can't be around that. Pt states that he has a messed up home life. States that he tried to blow his brains out prior to coming here. States that he then tried to take sleeping pills. Pt denies taking any sleeping pills. Pt extremely cooperative and reasonable at time of triage.
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
## 2024-10-06 NOTE — ED Notes (Signed)
 Report called to aj rn bhu nurse

## 2024-10-06 NOTE — ED Provider Notes (Signed)
 [facility] Provider Note    Event Date/Time   First MD Initiated Contact with Patient 10/06/24 1814     (approximate)   History   Suicidal   HPI  Jeremiah Newman is a 33 y.o. male who presents to the emergency department today because concerns for suicidal ideation.  Patient states he has a long history of depression, PTSD and suicidal ideation.  He states that he returned home about 3 days ago and since then his stress has been worse.  He did think about blowing his brains out and ingesting sleeping pills today but family stopped him. Denies any current medical complaints.     Physical Exam   Triage Vital Signs: ED Triage Vitals  Encounter Vitals Group     BP 10/06/24 1744 (!) 130/90     Girls Systolic BP Percentile --      Girls Diastolic BP Percentile --      Boys Systolic BP Percentile --      Boys Diastolic BP Percentile --      Pulse Rate 10/06/24 1744 78     Resp 10/06/24 1744 18     Temp 10/06/24 1744 98.9 F (37.2 C)     Temp Source 10/06/24 1744 Oral     SpO2 10/06/24 1744 99 %     Weight 10/06/24 1746 200 lb (90.7 kg)     Height 10/06/24 1746 5' 8 (1.727 m)     Head Circumference --      Peak Flow --      Pain Score 10/06/24 1745 0     Pain Loc --      Pain Education --      Exclude from Growth Chart --     Most recent vital signs: Vitals:   10/06/24 1744 10/06/24 1825  BP: (!) 130/90   Pulse: 78   Resp: 18   Temp: 98.9 F (37.2 C)   SpO2: 99% 99%   General: Awake, alert, oriented. CV:  Good peripheral perfusion. Regular rate and rhythm.  Resp:  Normal effort. Lungs clear. Abd:  No distention.    ED Results / Procedures / Treatments   Labs (all labs ordered are listed, but only abnormal results are displayed) Labs Reviewed  COMPREHENSIVE METABOLIC PANEL WITH GFR - Abnormal; Notable for the following components:      Result Value   Total Protein 8.2 (*)    All other components within normal limits  ETHANOL   CBC  URINE DRUG SCREEN, QUALITATIVE ([facility] ONLY)     EKG  None   RADIOLOGY None   PROCEDURES:  Critical Care performed: No  MEDICATIONS ORDERED IN ED: Medications - No data to display   IMPRESSION / MDM / ASSESSMENT AND PLAN / ED COURSE  I reviewed the triage vital signs and the nursing notes.                              Differential diagnosis includes, but is not limited to, SI, drug induced mood disorder, depression  Patient's presentation is most consistent with acute presentation with potential threat to life or bodily function.  Patient presented to the emergency department today because of concerns for SI.  Patient has significant mental health history.  The time my exam patient is calm.  Will have psychiatry evaluate. Denies any medical complaints.  The patient has been placed in psychiatric observation due to the need to provide  a safe environment for the patient while obtaining psychiatric consultation and evaluation, as well as ongoing medical and medication management to treat the patient's condition.  The patient has not been placed under full IVC at this time.       FINAL CLINICAL IMPRESSION(S) / ED DIAGNOSES   Final diagnoses:  Depression, unspecified depression type     Note:  This document was prepared using Dragon voice recognition software and may include unintentional dictation errors.    Floy Roberts, MD 10/06/24 2015
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
## 2024-10-07 ENCOUNTER — Inpatient Hospital Stay (HOSPITAL_COMMUNITY)
Admission: AD | Admit: 2024-10-07 | Discharge: 2024-10-13 | DRG: 885 | Disposition: A | Discharge status: Home / Self Care | Payer: 59 | Source: Intra-hospital

## 2024-10-07 DIAGNOSIS — Z560 Unemployment, unspecified: Secondary | ICD-10-CM | POA: Diagnosis not present

## 2024-10-07 DIAGNOSIS — F410 Panic disorder [episodic paroxysmal anxiety] without agoraphobia: Secondary | ICD-10-CM | POA: Diagnosis present

## 2024-10-07 DIAGNOSIS — F1990 Other psychoactive substance use, unspecified, uncomplicated: Secondary | ICD-10-CM | POA: Diagnosis not present

## 2024-10-07 DIAGNOSIS — E785 Hyperlipidemia, unspecified: Secondary | ICD-10-CM | POA: Diagnosis present

## 2024-10-07 DIAGNOSIS — F32A Depression, unspecified: Secondary | ICD-10-CM | POA: Diagnosis present

## 2024-10-07 DIAGNOSIS — F1994 Other psychoactive substance use, unspecified with psychoactive substance-induced mood disorder: Principal | ICD-10-CM | POA: Diagnosis present

## 2024-10-07 DIAGNOSIS — F1110 Opioid abuse, uncomplicated: Secondary | ICD-10-CM | POA: Diagnosis present

## 2024-10-07 DIAGNOSIS — Z91410 Personal history of adult physical and sexual abuse: Secondary | ICD-10-CM | POA: Diagnosis not present

## 2024-10-07 DIAGNOSIS — F19959 Other psychoactive substance use, unspecified with psychoactive substance-induced psychotic disorder, unspecified: Secondary | ICD-10-CM | POA: Diagnosis present

## 2024-10-07 DIAGNOSIS — F17290 Nicotine dependence, other tobacco product, uncomplicated: Secondary | ICD-10-CM | POA: Diagnosis present

## 2024-10-07 DIAGNOSIS — F1414 Cocaine abuse with cocaine-induced mood disorder: Secondary | ICD-10-CM | POA: Diagnosis present

## 2024-10-07 DIAGNOSIS — Z59868 Other specified financial insecurity: Secondary | ICD-10-CM

## 2024-10-07 DIAGNOSIS — Z91199 Patient's noncompliance with other medical treatment and regimen due to unspecified reason: Secondary | ICD-10-CM

## 2024-10-07 DIAGNOSIS — Z91411 Personal history of adult psychological abuse: Secondary | ICD-10-CM | POA: Diagnosis not present

## 2024-10-07 DIAGNOSIS — R45851 Suicidal ideations: Secondary | ICD-10-CM | POA: Diagnosis present

## 2024-10-07 DIAGNOSIS — Z634 Disappearance and death of family member: Secondary | ICD-10-CM | POA: Diagnosis not present

## 2024-10-07 DIAGNOSIS — G4700 Insomnia, unspecified: Secondary | ICD-10-CM | POA: Diagnosis present

## 2024-10-07 DIAGNOSIS — F411 Generalized anxiety disorder: Secondary | ICD-10-CM | POA: Diagnosis present

## 2024-10-07 DIAGNOSIS — Z5900 Homelessness unspecified: Secondary | ICD-10-CM | POA: Diagnosis not present

## 2024-10-07 DIAGNOSIS — F259 Schizoaffective disorder, unspecified: Principal | ICD-10-CM | POA: Diagnosis present

## 2024-10-07 DIAGNOSIS — E559 Vitamin D deficiency, unspecified: Secondary | ICD-10-CM | POA: Diagnosis present

## 2024-10-07 DIAGNOSIS — F4310 Post-traumatic stress disorder, unspecified: Secondary | ICD-10-CM | POA: Diagnosis present

## 2024-10-07 DIAGNOSIS — Z8249 Family history of ischemic heart disease and other diseases of the circulatory system: Secondary | ICD-10-CM

## 2024-10-07 DIAGNOSIS — F17210 Nicotine dependence, cigarettes, uncomplicated: Secondary | ICD-10-CM | POA: Diagnosis present

## 2024-10-07 DIAGNOSIS — Z79899 Other long term (current) drug therapy: Secondary | ICD-10-CM | POA: Diagnosis not present

## 2024-10-07 MED ORDER — DIPHENHYDRAMINE HCL 25 MG PO CAPS: 50.0000 mg | ORAL_CAPSULE | Freq: Three times a day (TID) | ORAL | Status: DC | PRN

## 2024-10-07 MED ORDER — NICOTINE 14 MG/24HR TD PT24
14.0000 mg | MEDICATED_PATCH | Freq: Every day | TRANSDERMAL | Status: DC
  Administered 2024-10-07: 14 mg via TRANSDERMAL
  Filled 2024-10-07: qty 1

## 2024-10-07 MED ORDER — LORAZEPAM 2 MG/ML IJ SOLN
2.0000 mg | Freq: Three times a day (TID) | INTRAMUSCULAR | Status: DC | PRN
Start: 2024-10-07 — End: 2024-10-13

## 2024-10-07 MED ORDER — PROPRANOLOL HCL 10 MG PO TABS
10.0000 mg | ORAL_TABLET | Freq: Every day | ORAL | Status: DC
  Administered 2024-10-08 – 2024-10-11 (×4): 10 mg via ORAL
  Filled 2024-10-07 (×5): qty 1

## 2024-10-07 MED ORDER — LORAZEPAM 2 MG PO TABS: 2.0000 mg | ORAL_TABLET | Freq: Three times a day (TID) | ORAL | Status: DC | PRN

## 2024-10-07 MED ORDER — HALOPERIDOL LACTATE 5 MG/ML IJ SOLN
10.0000 mg | Freq: Three times a day (TID) | INTRAMUSCULAR | Status: DC | PRN
Start: 2024-10-07 — End: 2024-10-13

## 2024-10-07 MED ORDER — HALOPERIDOL LACTATE 5 MG/ML IJ SOLN: 5.0000 mg | Freq: Three times a day (TID) | INTRAMUSCULAR | Status: DC | PRN

## 2024-10-07 MED ORDER — MAGNESIUM HYDROXIDE 400 MG/5ML PO SUSP: 30.0000 mL | Freq: Every day | ORAL | Status: DC | PRN

## 2024-10-07 MED ORDER — ATORVASTATIN CALCIUM 10 MG PO TABS
10.0000 mg | ORAL_TABLET | Freq: Every day | ORAL | Status: DC
  Administered 2024-10-07 – 2024-10-08 (×2): 10 mg via ORAL
  Filled 2024-10-07 (×3): qty 1

## 2024-10-07 MED ORDER — ACETAMINOPHEN 325 MG PO TABS: 650.0000 mg | ORAL_TABLET | Freq: Four times a day (QID) | ORAL | Status: DC | PRN

## 2024-10-07 MED ORDER — SERTRALINE HCL 100 MG PO TABS
100.0000 mg | ORAL_TABLET | Freq: Every day | ORAL | Status: DC
  Administered 2024-10-08 – 2024-10-13 (×6): 100 mg via ORAL
  Filled 2024-10-07: qty 10
  Filled 2024-10-07 (×7): qty 1

## 2024-10-07 MED ORDER — ALUM & MAG HYDROXIDE-SIMETH 200-200-20 MG/5ML PO SUSP: 30.0000 mL | ORAL | Status: DC | PRN

## 2024-10-07 MED ORDER — LORAZEPAM 2 MG/ML IJ SOLN: 2.0000 mg | Freq: Three times a day (TID) | INTRAMUSCULAR | Status: DC | PRN

## 2024-10-07 MED ORDER — NICOTINE 14 MG/24HR TD PT24
14.0000 mg | MEDICATED_PATCH | Freq: Every day | TRANSDERMAL | Status: DC
  Administered 2024-10-08 – 2024-10-13 (×6): 14 mg via TRANSDERMAL
  Filled 2024-10-07 (×7): qty 1

## 2024-10-07 MED ORDER — DIPHENHYDRAMINE HCL 50 MG/ML IJ SOLN
50.0000 mg | Freq: Three times a day (TID) | INTRAMUSCULAR | Status: DC | PRN
Start: 2024-10-07 — End: 2024-10-13

## 2024-10-07 MED ORDER — BUSPIRONE HCL 5 MG PO TABS
5.0000 mg | ORAL_TABLET | Freq: Two times a day (BID) | ORAL | Status: DC
  Administered 2024-10-07 – 2024-10-08 (×3): 5 mg via ORAL
  Filled 2024-10-07 (×3): qty 1

## 2024-10-07 MED ORDER — HALOPERIDOL 5 MG PO TABS
5.0000 mg | ORAL_TABLET | Freq: Three times a day (TID) | ORAL | Status: DC | PRN
Start: 2024-10-07 — End: 2024-10-07

## 2024-10-07 MED ORDER — OLANZAPINE 5 MG PO TABS
5.0000 mg | ORAL_TABLET | Freq: Two times a day (BID) | ORAL | Status: DC
  Administered 2024-10-07 – 2024-10-08 (×3): 5 mg via ORAL
  Filled 2024-10-07 (×3): qty 1

## 2024-10-07 MED ORDER — HALOPERIDOL 5 MG PO TABS: 5.0000 mg | ORAL_TABLET | Freq: Three times a day (TID) | ORAL | Status: DC | PRN

## 2024-10-07 MED ORDER — HYDROXYZINE HCL 25 MG PO TABS
25.0000 mg | ORAL_TABLET | Freq: Three times a day (TID) | ORAL | Status: DC | PRN
Start: 2024-10-07 — End: 2024-10-13
  Administered 2024-10-10 – 2024-10-11 (×2): 25 mg via ORAL
  Filled 2024-10-07: qty 10
  Filled 2024-10-07 (×2): qty 1

## 2024-10-07 MED ORDER — MIRTAZAPINE 7.5 MG PO TABS
7.5000 mg | ORAL_TABLET | Freq: Every day | ORAL | Status: DC
  Administered 2024-10-07: 7.5 mg via ORAL
  Filled 2024-10-07: qty 1

## 2024-10-07 MED ORDER — HYDROXYZINE HCL 25 MG PO TABS
50.0000 mg | ORAL_TABLET | Freq: Four times a day (QID) | ORAL | Status: DC | PRN
Start: 2024-10-07 — End: 2024-10-07
  Administered 2024-10-07: 50 mg via ORAL
  Filled 2024-10-07: qty 2

## 2024-10-07 NOTE — ED Notes (Signed)
VOL/Consult completed/Rec.Inpt.Admit 

## 2024-10-07 NOTE — ED Notes (Signed)
 Pt ABCs intact. RR even and unlabored. Pt in NAD. Bed in lowest locked position. Call bell in reach. Denies needs at this time.    Past Medical History:  Diagnosis Date   Schizophrenia (HCC)

## 2024-10-07 NOTE — ED Notes (Signed)
 Patient is resting comfortably.

## 2024-10-07 NOTE — ED Notes (Addendum)
Snack offered but pt refused.

## 2024-10-07 NOTE — BH Assessment (Signed)
 Patient has been accepted to [facility] 10/07/24   Patient assigned to room 403-2    Accepting physician is Dr. Raliegh   Dx: Schizoaffective DO   Call report to [phone]   Representative was [facility] Danika.

## 2024-10-07 NOTE — ED Notes (Signed)
 Patient sleeping

## 2024-10-07 NOTE — ED Notes (Addendum)
 Pt reports feeling very anxious and asking for medication to help. Pt also requesting a Nicotine  patch. MD aware.

## 2024-10-07 NOTE — ED Notes (Signed)
 RN Called to give report to [facility]. Pt's receiving RN still getting report. Will call back in about 20 minutes.

## 2024-10-07 NOTE — ED Notes (Signed)
 Meal provided patient sleeping left at bedside
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
## 2024-10-07 NOTE — ED Notes (Signed)
 Vol Pt Pending , going to [facility] tonight

## 2024-10-07 NOTE — ED Provider Notes (Signed)
 Emergency Medicine Observation Re-evaluation Note  Jeremiah Newman is a 33 y.o. male, seen on rounds today.  Pt initially presented to the ED for complaints of Suicidal  Currently, the patient is resting in bed. No reported issues from nursing team.   Physical Exam  BP (!) 130/90   Pulse 78   Temp 98.9 F (37.2 C) (Oral)   Resp 18   Ht 5' 8 (1.727 m)   Wt 90.7 kg   SpO2 99%   BMI 30.41 kg/m  Physical Exam General: Resting in bed  ED Course / MDM   Labs from yesterday without critical derangements  Plan  Current plan is for dispo per psychiatry, current plan for inpatient placement.    Levander Slate, MD 10/07/24 [phone]

## 2024-10-07 NOTE — Tx Team (Signed)
 Initial Treatment Plan 10/07/2024 11:10 PM [place] KROTZ FMW:[id number]    PATIENT STRESSORS: Medication change or noncompliance   Substance abuse     PATIENT STRENGTHS: General fund of knowledge  Motivation for treatment/growth    PATIENT IDENTIFIED PROBLEMS: Risk SI  depression  SA-ETOH  Mental health , my living situation, substance abuse treatment program               DISCHARGE CRITERIA:  Adequate post-discharge living arrangements Improved stabilization in mood, thinking, and/or behavior Verbal commitment to aftercare and medication compliance  PRELIMINARY DISCHARGE PLAN: Attend aftercare/continuing care group Attend PHP/IOP Attend 12-step recovery group Placement in alternative living arrangements Return to previous work or school arrangements  PATIENT/FAMILY INVOLVEMENT: This treatment plan has been presented to and reviewed with the patient, Jeremiah Newman.  The patient and family have been given the opportunity to ask questions and make suggestions.  Orlando Ozell LABOR, RN 10/07/2024, 11:10 PM

## 2024-10-08 ENCOUNTER — Other Ambulatory Visit: Payer: Self-pay

## 2024-10-08 ENCOUNTER — Encounter (HOSPITAL_COMMUNITY): Payer: Self-pay

## 2024-10-08 DIAGNOSIS — R45851 Suicidal ideations: Secondary | ICD-10-CM

## 2024-10-08 DIAGNOSIS — F1990 Other psychoactive substance use, unspecified, uncomplicated: Secondary | ICD-10-CM

## 2024-10-08 LAB — URINALYSIS, COMPLETE (UACMP) WITH MICROSCOPIC
Bacteria, UA: NONE SEEN
Bilirubin Urine: NEGATIVE
Glucose, UA: NEGATIVE mg/dL
Hgb urine dipstick: NEGATIVE
Ketones, ur: NEGATIVE mg/dL
Leukocytes,Ua: NEGATIVE
Nitrite: NEGATIVE
Protein, ur: NEGATIVE mg/dL
Specific Gravity, Urine: 1.015 (ref 1.005–1.030)
pH: 5 (ref 5.0–8.0)

## 2024-10-08 LAB — VITAMIN B12: Vitamin B-12: 498 pg/mL (ref 180–914)

## 2024-10-08 LAB — VITAMIN D 25 HYDROXY (VIT D DEFICIENCY, FRACTURES): Vit D, 25-Hydroxy: 19.39 ng/mL — ABNORMAL LOW (ref 30–100)

## 2024-10-08 LAB — LIPID PANEL
Cholesterol: 163 mg/dL (ref 0–200)
HDL: 29 mg/dL — ABNORMAL LOW (ref >40)
LDL Cholesterol: 66 mg/dL (ref 0–99)
Total CHOL/HDL Ratio: 5.6 ratio
Triglycerides: 340 mg/dL — ABNORMAL HIGH (ref <150)
VLDL: 68 mg/dL — ABNORMAL HIGH (ref 0–40)

## 2024-10-08 LAB — TSH: TSH: 1.07 u[IU]/mL (ref 0.350–4.500)

## 2024-10-08 MED ORDER — OLANZAPINE 5 MG PO TABS
5.0000 mg | ORAL_TABLET | Freq: Two times a day (BID) | ORAL | Status: DC
  Administered 2024-10-09 (×2): 5 mg via ORAL
  Filled 2024-10-08 (×2): qty 1

## 2024-10-08 MED ORDER — NICOTINE POLACRILEX 2 MG MT GUM
2.0000 mg | CHEWING_GUM | OROMUCOSAL | Status: DC | PRN
  Administered 2024-10-08 – 2024-10-13 (×20): 2 mg via ORAL
  Filled 2024-10-08 (×4): qty 1

## 2024-10-08 MED ORDER — BUSPIRONE HCL 7.5 MG PO TABS
7.5000 mg | ORAL_TABLET | Freq: Two times a day (BID) | ORAL | Status: DC
  Administered 2024-10-09 – 2024-10-13 (×9): 7.5 mg via ORAL
  Filled 2024-10-08 (×2): qty 1
  Filled 2024-10-08: qty 20
  Filled 2024-10-08 (×8): qty 1
  Filled 2024-10-08: qty 20

## 2024-10-08 MED ORDER — MIRTAZAPINE 15 MG PO TABS
15.0000 mg | ORAL_TABLET | Freq: Every day | ORAL | Status: DC
  Administered 2024-10-08: 15 mg via ORAL
  Filled 2024-10-08 (×2): qty 1

## 2024-10-08 NOTE — Plan of Care (Signed)
   Problem: Education: Goal: Emotional status will improve Outcome: Progressing Goal: Mental status will improve Outcome: Progressing   Problem: Activity: Goal: Interest or engagement in activities will improve Outcome: Progressing Goal: Sleeping patterns will improve Outcome: Progressing

## 2024-10-08 NOTE — Group Note (Signed)
 Date:  10/08/2024 Time:  10:39 AM  Group Topic/Focus:  Nutrition Group    Participation Level:  Did Not Attend  Jeremiah Newman [name] 10/08/2024, 10:39 AM

## 2024-10-08 NOTE — BHH Suicide Risk Assessment (Signed)
 BHH INPATIENT:  Family/Significant Other Suicide Prevention Education  Suicide Prevention Education:  Patient Refusal for Family/Significant Other Suicide Prevention Education: The patient Jeremiah Newman has refused to provide written consent for family/significant other to be provided Family/Significant Other Suicide Prevention Education during admission and/or prior to discharge.  Physician notified.  Jenkins LULLA Primer 10/08/2024, 10:48 AM

## 2024-10-08 NOTE — Group Note (Signed)
 Date:  10/08/2024 Time:  2:30 PM  Group Topic/Focus:  Emotional Education:   The focus of this group is to discuss what feelings/emotions are, and how they are experienced.    Participation Level:  Did Not Attend    Ubaldo DELENA Spears 10/08/2024, 2:30 PM

## 2024-10-08 NOTE — Group Note (Signed)
 Date:  10/08/2024 Time:  9:36 AM  Group Topic/Focus:  Emotional Education:   The focus of this group is to discuss what feelings/emotions are, and how they are experienced. Goals Group:   The focus of this group is to help patients establish daily goals to achieve during treatment and discuss how the patient can incorporate goal setting into their daily lives to aide in recovery. Orientation:   The focus of this group is to educate the patient on the purpose and policies of crisis stabilization and provide a format to answer questions about their admission.  The group details unit policies and expectations of patients while admitted.    Participation Level:  Did Not Attend  Jeremiah Newman [name] 10/08/2024, 9:36 AM

## 2024-10-08 NOTE — Progress Notes (Signed)
   10/08/24 1100  Psych Admission Type (Psych Patients Only)  Admission Status Voluntary  Psychosocial Assessment  Patient Complaints Self-harm thoughts;Depression  Eye Contact Fair  Facial Expression Sad  Affect Anxious  Speech Logical/coherent  Interaction Assertive  Motor Activity Other (Comment) (WNL)  Appearance/Hygiene Unremarkable  Behavior Characteristics Cooperative;Calm  Mood Pleasant  Thought Process  Coherency WDL  Content WDL  Delusions None reported or observed  Perception WDL  Hallucination None reported or observed  Judgment Impaired  Confusion None  Danger to Self  Current suicidal ideation? Passive  Self-Injurious Behavior Some self-injurious ideation observed or expressed.  No lethal plan expressed   Agreement Not to Harm Self Yes  Description of Agreement verbal  Danger to Others  Danger to Others None reported or observed

## 2024-10-08 NOTE — Group Note (Unsigned)
 Date:  10/08/2024 Time:  7:56 AM  Group Topic/Focus:  Coping With Mental Health Crisis:   The purpose of this group is to help patients identify strategies for coping with mental health crisis.  Group discusses possible causes of crisis and ways to manage them effectively.     Participation Level:  {BHH PARTICIPATION OZCZO:77735}  Participation Quality:  {BHH PARTICIPATION QUALITY:22265}  Affect:  {BHH AFFECT:22266}  Cognitive:  {BHH COGNITIVE:22267}  Insight: {BHH Insight2:20797}  Engagement in Group:  {BHH ENGAGEMENT IN HMNLE:77731}  Modes of Intervention:  {BHH MODES OF INTERVENTION:22269}  Additional Comments:  ***  Jaquetta Currier E Bravery Ketcham 10/08/2024, 7:56 AM

## 2024-10-08 NOTE — Plan of Care (Signed)

## 2024-10-08 NOTE — Group Note (Signed)
 LCSW Group Therapy Note   Group Date: 10/08/2024 Start Time: 1100 End Time: 1200   Participation:  patient was present.  He listened and was respectful but didn't participate in the discussion.  Type of Therapy:  Group Therapy   Topic:  Stronger Together:  Building Healthy Relationships  Objective:  To explore loneliness, boundaries, and safe ways to build relationships.  Goals: - Recognize healthy vs. unhealthy relationships. - Learn safe ways to connect with others. - Psychiatrist.  Summary:  Participants discussed loneliness, healthy connections, and setting boundaries. They explored safe ways to meet people and shared personal experiences. Key insights were reinforced through discussion and quotes.  Therapeutic Modalities Used: - Cognitive Behavioral Therapy (CBT) Elements - Identifying unhealthy relationship patterns, challenging negative thoughts about connection. - Dialectical Behavior Therapy (DBT) Elements - Interpersonal effectiveness, setting and maintaining boundaries. - Supportive Group Therapy - Peer discussion, shared experiences, and emotional validation.   Bomani Oommen O Lamica Mccart, LCSWA 10/08/2024  4:41 PM

## 2024-10-08 NOTE — Progress Notes (Signed)
(  Sleep Hours) -12  (Any PRNs that were needed, meds refused, or side effects to meds)- none  (Any disturbances and when (visitation, over night)- none  (Concerns raised by the patient)- none  (SI/HI/AVH)- SI

## 2024-10-08 NOTE — BHH Group Notes (Signed)
 BHH Group Notes:  (Nursing/MHT/Case Management/Adjunct)  Date:  10/08/2024  Time:  10:49 PM  Type of Therapy:  Wrap-up group  Participation Level:  Did Not Attend  Participation Quality:    Affect:    Cognitive:    Insight:    Engagement in Group:    Modes of Intervention:    Summary of Progress/Problems Didn't attend.   Jeremiah Newman 10/08/2024, 10:49 PM

## 2024-10-08 NOTE — Plan of Care (Signed)
   Problem: Education: Goal: Emotional status will improve Outcome: Progressing Goal: Mental status will improve Outcome: Progressing   Problem: Activity: Goal: Interest or engagement in activities will improve Outcome: Progressing Goal: Sleeping patterns will improve Outcome: Progressing   Problem: Safety: Goal: Periods of time without injury will increase Outcome: Progressing

## 2024-10-08 NOTE — Progress Notes (Signed)
 Jeremiah Newman   Type of Note: Resources  Pt was given list of substance use facilities, the number for [facility], and the number for [employer] and parole per pt's request.   No other needs or concerns at this time.   Signed:  Debby Clyne, LCSW-A 10/08/2024  1:59 PM

## 2024-10-08 NOTE — H&P (Signed)
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 this medication were discussed in detail with the patient and time was given for questions. The patient consents to medication trial.   -- Metabolic profile and EKG monitoring obtained while on an atypical antipsychotic (BMI: Lipid Panel: HbgA1c: QTc:)   -- Encouraged patient to participate in unit milieu and in scheduled group therapies   Safety and Monitoring:  Voluntary admission to inpatient psychiatric unit for safety, stabilization and treatment  Daily contact with patient to assess and evaluate symptoms and progress in treatment   Patient's case to be discussed in multi-disciplinary team meeting  Observation Level : q15 minute checks  Vital signs: q12 hours  Precautions: suicide, but pt currently verbally contracts for safety on unit?   Discharge Planning:  Social work and case management to assist with discharge planning and identification of hospital follow-up needs prior to discharge  Estimated LOS: 5-7?days  Discharge Concerns: Need to establish Safety plan; Medication compliance and effectiveness  Discharge Goals: Return home with outpatient referrals for mental health follow-up including medication management/psychotherapy.   Long Term Goal(s): Improvement in symptoms so as ready for discharge  Short Term Goals: Ability to identify changes in lifestyle to reduce recurrence of condition will improve, Ability to verbalize feelings will improve, Ability to disclose and discuss suicidal ideas, Ability to demonstrate self-control will improve, Ability to identify and develop effective coping behaviors will improve, Ability to maintain clinical measurements within normal limits will improve, Compliance with prescribed medications will improve, and Ability to identify triggers associated with substance abuse/mental health issues will improve  Physician Treatment Plan for Secondary Diagnosis: Principal Problem:   Schizoaffective disorder (HCC) Active diagnosis: Substance-induced mood disorder Suicidal ideation PTSD Polysubstance use disorder  I certify that inpatient services furnished can reasonably be expected to improve the patient's condition.    Devita Nies C Law Corsino, FNP 11/6/20251:14 PM
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
## 2024-10-08 NOTE — Progress Notes (Signed)
(  Sleep Hours) -6.5  (Any PRNs that were needed, meds refused, or side effects to meds)- none  (Any disturbances and when (visitation, over night)- none   (Concerns raised by the patient)- ETOH , SA facility  (SI/HI/AVH)- SI
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
## 2024-10-08 NOTE — BHH Group Notes (Signed)
 Patient did not attend OT group.

## 2024-10-09 ENCOUNTER — Encounter (HOSPITAL_COMMUNITY): Payer: Self-pay

## 2024-10-09 NOTE — Plan of Care (Signed)
   Problem: Education: Goal: Emotional status will improve Outcome: Progressing Goal: Mental status will improve Outcome: Progressing

## 2024-10-09 NOTE — Plan of Care (Signed)
   Problem: Education: Goal: Knowledge of [facility] Education information/materials will improve Outcome: Progressing Goal: Emotional status will improve Outcome: Progressing Goal: Mental status will improve Outcome: Progressing

## 2024-10-09 NOTE — Progress Notes (Signed)
 Tour of Duty:  Prentice JINNY Angle, RN, 10/09/24, Tour of Duty: 0700-1900  SI/HI/AVH: [place] passive SI, denies plan or intent. Denies HI/AVH  Self-Reported   Mood: Negative  Anxiety: Endorses Depression: Endorses Irritability: Denies  Broset  Violence Prevention Guidelines *See Row Information*: Small Violence Risk interventions implemented   LBM  Last BM Date : 10/08/24   Pain: not present  Patient Refusals (including Rx): No  >>Shift Summary: Patient observed to be mildly anxious on unit. Patient able to make needs known. Patient observed to engage appropriately with staff and peers. Patient taking medications as prescribed. This shift, PRN medication requested or required. No reported or observed side effects to medication. No reported or observed agitation, aggression, or other acute emotional distress. No reported or observed physical abnormalities or concerns.  Last Vitals  Vitals Weight: 88 kg Temp: (!) 97.5 F (36.4 C) Temp Source: Oral Pulse Rate: 75 Resp: 18 BP: 129/68 Patient Position: (not recorded)  Admission Type  Psych Admission Type (Psych Patients Only) Admission Status: Voluntary Date 72 hour document signed : (not recorded) Time 72 hour document signed : (not recorded) Provider Notified (First and Last Name) (see details for LINK to note): (not recorded)   Psychosocial Assessment  Psychosocial Assessment Patient Complaints: Self-harm behaviors, Depression Eye Contact: Fair Facial Expression: Animated Affect: Anxious Speech: Logical/coherent Interaction: Assertive Motor Activity: Other (Comment) (WDL) Appearance/Hygiene: Unremarkable Behavior Characteristics: Cooperative Mood: Anxious, Depressed   Aggressive Behavior  Targets: (not recorded)   Thought Process  Thought Process Coherency: Circumstantial Content: Blaming self Delusions: Within Defined Limits Perception: Within Defined Limits Hallucination: None reported or  observed Judgment: Limited Confusion: None  Danger to Self/Others  Danger to Self Current suicidal ideation?: Passive Description of Suicide Plan: (not recorded) Self-Injurious Behavior: 2 Agreement Not to Harm Self: (not recorded) Description of Agreement: (not recorded) Danger to Others: None reported or observed

## 2024-10-09 NOTE — Progress Notes (Signed)
 Ar[facility] KATHEE Picket   Type of Note: Substance Use Treatment  Spoke with pt who reports he completed phone screen with [facility]. He was accepted to their program. States he also called [place] probation/parole and his officer will be sending a letter to [place] today stating he is no longer on parole. Pt states that is the only reason he was declined from [facility]. [facility] is his first choice.  This [facility], [facility] [phone]) admissions who confirmed pt is accepted to their program in [place], [place] and can admit there any day. States pt would need a Greyh[employer] to [place], [place] and they will pick him up from the bus depot there and bring him to treatment. They do not provide Greyhound tickets.  Pt plans to call [facility] this afternoon regarding the letter from parole in hopes he can go there at discharge. If unable to, pt would like to go to [facility].   Pt will follow up with this writer regarding [facility] decision.  Signed:  Affie Gasner, LCSW-A 10/09/2024  10:54 AM

## 2024-10-09 NOTE — Group Note (Signed)
 Date:  10/09/2024 Time:  1:50 PM  Group Topic/Focus: Social Wellness Developing a Wellness Toolbox:   The focus of this group is to help patients develop a wellness toolbox with skills and strategies to promote recovery upon discharge.    Participation Level:  Did Not Attend    Additional Comments:  Did not attend  Jeremiah Newman 10/09/2024, 1:50 PM

## 2024-10-09 NOTE — Group Note (Signed)
 Recreation Therapy Group Note   Group Topic:Leisure Education  Group Date: 10/09/2024 Start Time: 0940 End Time: 1012 Facilitators: Alvis Edgell-McCall, LRT,CTRS Location: [address] Dayroom   Group Topic: Leisure Education  Goal Area(s) Addresses:  Patient will identify positive leisure activities.  Patient will identify one positive benefit of participation in leisure activities.   Behavioral Response: Active  Intervention: Leisure Group Game  Activity: Patient, MHT, and LRT participated in playing a trivia game of Guess the [place]. Teams took turns spinning the wheel. Whatever category ([address] Pop, Dance, Hip Hop, Indie and R&B) the wheel landed on, LRT read the lyric and that team would guess the missing lyric. The team with the most points wins the game.  Education:  Leisure Exposure, Pharmacist, Community, Discharge Planning  Education Outcome: Acknowledges education/In group clarification offered/Needs additional education   Affect/Mood: Appropriate   Participation Level: Active   Participation Quality: Independent   Behavior: Appropriate   Speech/Thought Process: Focused   Insight: Good   Judgement: Good   Modes of Intervention: Competitive Play   Patient Response to Interventions:  Engaged   Education Outcome:  In group clarification offered    Clinical Observations/Individualized Feedback: Pt attended and participated during group session.      Plan: Continue to engage patient in RT group sessions 2-3x/week.   Crosby Oriordan-McCall, LRT,CTRS 10/09/2024 12:10 PM

## 2024-10-09 NOTE — Group Note (Signed)
 Date:  10/09/2024 Time:  3:52 PM  Group Topic/Focus:  Overcoming Stress:   The focus of this group is to define stress and help patients assess their triggers.    Participation Level:  Active  Participation Quality:  Appropriate  Affect:  Appropriate  Cognitive:  Appropriate  Insight: Appropriate  Engagement in Group:  Engaged  Modes of Intervention:  Discussion   Jeremiah  Kiowa Newman 10/09/2024, 3:52 PM

## 2024-10-09 NOTE — BH IP Treatment Plan (Signed)
 Interdisciplinary Treatment and Diagnostic Plan Update  10/09/2024 Time of Session: 11:10 AM Jeremiah Newman MRN: [id number]  Principal Diagnosis: Schizoaffective disorder [facility])  Secondary Diagnoses: Principal Problem:   Schizoaffective disorder (HCC)   Current Medications:  Current Facility-Administered Medications  Medication Dose Route Frequency Provider Last Rate Last Admin   acetaminophen  (TYLENOL ) tablet 650 mg  650 mg Oral Q6H PRN Smith, Annie B, NP       alum & mag hydroxide-simeth (MAALOX/MYLANTA) 200-200-20 MG/5ML suspension 30 mL  30 mL Oral Q4H PRN Smith, Annie B, NP       atorvastatin (LIPITOR) tablet 10 mg  10 mg Oral QHS Smith, Annie B, NP   10 mg at 10/08/24 2122   busPIRone (BUSPAR) tablet 7.5 mg  7.5 mg Oral BID Ntuen, Tina C, FNP   7.5 mg at 10/09/24 9068   haloperidol  (HALDOL ) tablet 5 mg  5 mg Oral TID PRN Smith, Annie B, NP       And   diphenhydrAMINE  (BENADRYL ) capsule 50 mg  50 mg Oral TID PRN Smith, Annie B, NP       haloperidol  lactate (HALDOL ) injection 5 mg  5 mg Intramuscular TID PRN Smith, Annie B, NP       And   diphenhydrAMINE  (BENADRYL ) injection 50 mg  50 mg Intramuscular TID PRN Smith, Annie B, NP       And   LORazepam  (ATIVAN ) injection 2 mg  2 mg Intramuscular TID PRN Smith, Annie B, NP       haloperidol  lactate (HALDOL ) injection 10 mg  10 mg Intramuscular TID PRN Smith, Annie B, NP       And   diphenhydrAMINE  (BENADRYL ) injection 50 mg  50 mg Intramuscular TID PRN Smith, Annie B, NP       And   LORazepam  (ATIVAN ) injection 2 mg  2 mg Intramuscular TID PRN Smith, Annie B, NP       hydrOXYzine  (ATARAX ) tablet 25 mg  25 mg Oral TID PRN Smith, Annie B, NP       magnesium  hydroxide (MILK OF MAGNESIA) suspension 30 mL  30 mL Oral Daily PRN Smith, Annie B, NP       mirtazapine  (REMERON ) tablet 15 mg  15 mg Oral QHS Ntuen, Tina C, FNP   15 mg at 10/08/24 2122   nicotine  (NICODERM CQ  - dosed in mg/24 hours) patch 14 mg  14 mg Transdermal Daily Smith,  Annie B, NP   14 mg at 10/09/24 0930   nicotine  polacrilex (NICORETTE ) gum 2 mg  2 mg Oral PRN Ntuen, Tina C, FNP   2 mg at 10/09/24 1315   OLANZapine  (ZYPREXA ) tablet 5 mg  5 mg Oral BID Ntuen, Tina C, FNP   5 mg at 10/09/24 0931   propranolol (INDERAL) tablet 10 mg  10 mg Oral Daily Smith, Annie B, NP   10 mg at 10/09/24 0931   sertraline (ZOLOFT) tablet 100 mg  100 mg Oral Daily Smith, Annie B, NP   100 mg at 10/09/24 0930   PTA Medications: Medications Prior to Admission  Medication Sig Dispense Refill Last Dose/Taking   atorvastatin (LIPITOR) 10 MG tablet Take 10 mg by mouth at bedtime.      buprenorphine -naloxone  (SUBOXONE ) 8-2 mg SUBL SL tablet Place 2 tablets under the tongue daily. (Patient not taking: Reported on 10/07/2024) 30 tablet 0    busPIRone (BUSPAR) 15 MG tablet Take 15 mg by mouth 2 (two) times daily.  FLUoxetine  (PROZAC ) 20 MG capsule Take 1 capsule (20 mg total) by mouth daily. (Patient not taking: Reported on 10/07/2024) 30 capsule 0    FLUoxetine  (PROZAC ) 20 MG capsule Take 1 capsule (20 mg total) by mouth daily. (Patient not taking: Reported on 10/07/2024) 30 capsule 0    mirtazapine  (REMERON ) 15 MG tablet Take 15 mg by mouth at bedtime.      OLANZapine  (ZYPREXA ) 10 MG tablet Take 10 mg by mouth 2 (two) times daily.      propranolol (INDERAL) 10 MG tablet Take 10 mg by mouth.      sertraline (ZOLOFT) 100 MG tablet Take 100 mg by mouth daily.       Patient Stressors: Medication change or noncompliance   Substance abuse    Patient Strengths: Automotive Engineer for treatment/growth   Treatment Modalities: Medication Management, Group therapy, Case management,  1 to 1 session with clinician, Psychoeducation, Recreational therapy.   Physician Treatment Plan for Primary Diagnosis: Schizoaffective disorder (HCC) Long Term Goal(s): Improvement in symptoms so as ready for discharge   Short Term Goals: Ability to identify changes in lifestyle to  reduce recurrence of condition will improve Ability to verbalize feelings will improve Ability to disclose and discuss suicidal ideas Ability to demonstrate self-control will improve Ability to identify and develop effective coping behaviors will improve Ability to maintain clinical measurements within normal limits will improve Compliance with prescribed medications will improve Ability to identify triggers associated with substance abuse/mental health issues will improve  Medication Management: Evaluate patient's response, side effects, and tolerance of medication regimen.  Therapeutic Interventions: 1 to 1 sessions, Unit Group sessions and Medication administration.  Evaluation of Outcomes: Not Progressing  Physician Treatment Plan for Secondary Diagnosis: Principal Problem:   Schizoaffective disorder (HCC)  Long Term Goal(s): Improvement in symptoms so as ready for discharge   Short Term Goals: Ability to identify changes in lifestyle to reduce recurrence of condition will improve Ability to verbalize feelings will improve Ability to disclose and discuss suicidal ideas Ability to demonstrate self-control will improve Ability to identify and develop effective coping behaviors will improve Ability to maintain clinical measurements within normal limits will improve Compliance with prescribed medications will improve Ability to identify triggers associated with substance abuse/mental health issues will improve     Medication Management: Evaluate patient's response, side effects, and tolerance of medication regimen.  Therapeutic Interventions: 1 to 1 sessions, Unit Group sessions and Medication administration.  Evaluation of Outcomes: Not Progressing   RN Treatment Plan for Primary Diagnosis: Schizoaffective disorder (HCC) Long Term Goal(s): Knowledge of disease and therapeutic regimen to maintain health will improve  Short Term Goals: Ability to remain free from injury will  improve, Ability to verbalize frustration and anger appropriately will improve, Ability to demonstrate self-control, Ability to participate in decision making will improve, Ability to verbalize feelings will improve, Ability to disclose and discuss suicidal ideas, Ability to identify and develop effective coping behaviors will improve, and Compliance with prescribed medications will improve  Medication Management: RN will administer medications as ordered by provider, will assess and evaluate patient's response and provide education to patient for prescribed medication. RN will report any adverse and/or side effects to prescribing provider.  Therapeutic Interventions: 1 on 1 counseling sessions, Psychoeducation, Medication administration, Evaluate responses to treatment, Monitor vital signs and CBGs as ordered, Perform/monitor CIWA, COWS, AIMS and Fall Risk screenings as ordered, Perform wound care treatments as ordered.  Evaluation of Outcomes: Not Progressing  LCSW Treatment Plan for Primary Diagnosis: Schizoaffective disorder [facility]) Long Term Goal(s): Safe transition to appropriate next level of care at discharge, Engage patient in therapeutic group addressing interpersonal concerns.  Short Term Goals: Engage patient in aftercare planning with referrals and resources, Increase social support, Increase ability to appropriately verbalize feelings, Increase emotional regulation, Facilitate acceptance of mental health diagnosis and concerns, Facilitate patient progression through stages of change regarding substance use diagnoses and concerns, Identify triggers associated with mental health/substance abuse issues, and Increase skills for wellness and recovery  Therapeutic Interventions: Assess for all discharge needs, 1 to 1 time with Social worker, Explore available resources and support systems, Assess for adequacy in community support network, Educate family and significant other(s) on suicide  prevention, Complete Psychosocial Assessment, Interpersonal group therapy.  Evaluation of Outcomes: Not Progressing   Progress in Treatment: Attending groups: attended some groups Participating in groups: Yes. Taking medication as prescribed: Yes. Toleration medication: Yes. Family/Significant other contact made: No, will contact:  patient declined to provide consents Patient understands diagnosis: Yes. Discussing patient identified problems/goals with staff: Yes. Medical problems stabilized or resolved: Yes. Denies suicidal/homicidal ideation: Yes. Issues/concerns per patient self-inventory: No.  New problem(s) identified:  No  New Short Term/Long Term Goal(s):   medication stabilization, elimination of SI thoughts, development of comprehensive mental wellness plan.   Patient Goals:  I stay in a constant stay of panic, and I can't sleep.  Discharge Plan or Barriers:  Patient recently admitted. CSW will continue to follow and assess for appropriate referrals and possible discharge planning.    Reason for Continuation of Hospitalization: Medication stabilization Suicidal ideation  Estimated Length of Stay:  5 - 7 days  Last 3 [place] Suicide Severity Risk Score: Flowsheet Row [facility] ([facility]2024 [facility] Ad[facility] (Discharged) [facility]  C-SSRS RISK CATEGORY High Risk High Risk High Risk    Last PHQ 2/9 Scores:     No data to display          Scribe for Treatment Team: Jillann Charette O Dequann Vandervelden, LCSWA 10/09/2024 3:55 PM

## 2024-10-09 NOTE — Group Note (Signed)
 Date:  10/09/2024 Time:  11:13 AM  Group Topic/Focus: Self-Regulation assessment and goals orientation  Goals Group:   The focus of this group is to help patients establish daily goals to achieve during treatment and discuss how the patient can incorporate goal setting into their daily lives to aide in recovery. Orientation:   The focus of this group is to educate the patient on the purpose and policies of crisis stabilization and provide a format to answer questions about their admission.  The group details unit policies and expectations of patients while admitted.    Participation Level:  Did Not Attend   Additional Comments:  Patient did not attend group   Jeremiah Newman M Shireen Rayburn 10/09/2024, 11:13 AM
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
## 2024-10-09 NOTE — Group Note (Signed)
 Date:  10/09/2024 Time:  1:20 PM  Group Topic/Focus: Recreational Therapy Patient use their thinking skills to guess the lyrics of the song     Participation Level:  Did Not Attend   Additional Comments:  Patient didn't attend group   Lamyah Creed M Theodore Rahrig 10/09/2024, 1:20 PM

## 2024-10-10 MED ORDER — OLANZAPINE 5 MG PO TABS
5.0000 mg | ORAL_TABLET | Freq: Every day | ORAL | Status: DC
  Administered 2024-10-11: 5 mg via ORAL
  Filled 2024-10-10: qty 10
  Filled 2024-10-10 (×2): qty 1

## 2024-10-10 MED ORDER — PRAZOSIN HCL 1 MG PO CAPS
1.0000 mg | ORAL_CAPSULE | Freq: Every day | ORAL | Status: DC
  Administered 2024-10-10: 1 mg via ORAL
  Filled 2024-10-10: qty 1

## 2024-10-10 NOTE — Group Note (Signed)
 Date:  10/10/2024 Time:  9:33 AM  Group Topic/Focus: Goals Group  Patients participated in a goals group focused on setting SMART goals related to recovery. The group began with an psychiatrist using a beach ball with questions to promote engagement and build rapport. Patients then reviewed the components of SMART goals and completed a goals worksheet individually. Participants were encouraged to share their personal SMART goals with the group to promote insight, accountability, and peer support.  Participation Level:  Did Not Attend  Participation Quality:  N/A  Affect:  N/A  Cognitive:  N/A  Insight: None  Engagement in Group:  None  Modes of Intervention:  N/A  Additional Comments:  Jeremiah Newman did not attend goals group.  [name] 10/10/2024, 9:33 AM

## 2024-10-10 NOTE — Progress Notes (Addendum)
(  Sleep Hours) -8.5 (Any PRNs that were needed, meds refused, or side effects to meds)- refused lipitor and remeron  @ HS (Any disturbances and when (visitation, over night)-none (Concerns raised by the patient)- none (SI/HI/AVH)- passive SI  Pt refused to get out of bed this evening. Pt spoke to me but stated he did not feel good. Pt encouraged x 3 to get out of bed but refused. Pt would not tell me why he did not feel good. Denied stomach ache and headache. Upon 4th time of checking on patient, this nurse offered to bring medications to the patient, but patient would not answer. Resp even and unlabored.

## 2024-10-10 NOTE — [facility] Group Notes (Signed)
 [facility] Group Notes:  (Nursing/MHT/Case Management/Adjunct)  Date:  10/10/2024  Time:  4:41 AM  Type of Therapy:  Wrap-up group  Participation Level:  Did Not Attend  Participation Quality:    Affect:    Cognitive:    Insight:    Engagement in Group:    Modes of Intervention:    Summary of Progress/Problems: Refused to attend group.  Jeremiah Newman 10/10/2024, 4:41 AM

## 2024-10-10 NOTE — Group Note (Signed)
 Date:  10/10/2024 Time:  3:42 PM  Group Topic/Focus: Self confidence and peer support  This group participated in a music therapy session utilizing karaoke to promote self-expression, mood elevation, and social connection. The session provided a supportive and safe environment for patients to engage creatively through singing and listening to music. The activity encouraged self-confidence, peer support, and positive social interaction.    Participation Level:  Active  Participation Quality:  Appropriate, Attentive, Sharing, and Supportive  Affect:  Appropriate  Cognitive:  Alert and Appropriate  Insight: Good and Improving  Engagement in Group:  Engaged, Improving, and Supportive  Modes of Intervention:  Activity, Socialization, and Support  Additional Comments:  Selah attended and actively participated in [place].  Kristi HERO Dlynn Ranes 10/10/2024, 3:42 PM

## 2024-10-10 NOTE — Group Note (Signed)
 Date:  10/10/2024 Time:  8:42 PM  Group Topic/Focus:  Wrap-Up Group:   The focus of this group is to help patients review their daily goal of treatment and discuss progress on daily workbooks.    Participation Level:  Did Not Attend  Participation Quality:  DNA  Affect:  DNA  Cognitive:  DNA  Insight: Improving  Engagement in Group:  None  Modes of Intervention:  Discussion  Additional Comments:  The pt did not attend group.  [name] 10/10/2024, 8:42 PM

## 2024-10-10 NOTE — Group Note (Signed)
 Date:  10/10/2024 Time:  3:07 PM  Group Topic/Focus: Emotional Education   The group viewed a TED Talk on resilience, highlighting coping with adversity and cultivating personal strengths. The discussion provided a safe space for patients to reflect on past adverse events and explore strategies for managing challenges, focusing on controllable factors, and drawing on personal strengths and support. The group reinforced the concept that resilience is a skill that can be learned and practiced.   Participation Level:  Active  Participation Quality:  Appropriate, Attentive, and Sharing  Affect:  Appropriate  Cognitive:  Alert and Appropriate  Insight: Appropriate and Improving  Engagement in Group:  Engaged and Improving  Modes of Intervention:  Discussion, Education, Exploration, Problem-solving, and Socialization  Additional Comments:  Jeremiah Newman attended and actively participated in this wellness group,  Kristi HERO [facility] 10/10/2024, 3:07 PM

## 2024-10-10 NOTE — Progress Notes (Signed)
(  Sleep Hours) -9.25  (Any PRNs that were needed, meds refused, or side effects to meds)- hydroxyzine  25mg   (Any disturbances and when (visitation, over night)-none  (Concerns raised by the patient)- HS meds given early as patient states going to bed and admits will not get back up for med administration  (SI/HI/AVH)-denied

## 2024-10-10 NOTE — Progress Notes (Signed)
 Tour of Duty:  Prentice JINNY Angle, RN, 10/10/24, Tour of Duty: 0700-1900  SI/HI/AVH: Lewanda passive SI with no plan or intent. Denies HI/AVH  Self-Reported   Mood: Positive  Anxiety: Denies Depression: Denies Irritability: Denies  Broset  Violence Prevention Guidelines *See Row Information*: Small Violence Risk interventions implemented   LBM  Last BM Date : 10/09/24   Pain: not present  Patient Refusals (including Rx): No  >>Shift Summary: Patient observed to be calm on unit. Patient able to make needs known. Patient difficult to rouse for morning medication. Patient observed to engage appropriately with staff and peers. Patient taking medications as prescribed. This shift, no PRN medication requested or required. No reported or observed side effects to medication. No reported or observed agitation, aggression, or other acute emotional distress. No reported or observed physical abnormalities or concerns.  Last Vitals  Vitals Weight: 88 kg Temp: 98 F (36.7 C) Temp Source: Oral Pulse Rate: 67 Resp: 20 BP: 134/69 Patient Position: (not recorded)  Admission Type  Psych Admission Type (Psych Patients Only) Admission Status: Voluntary Date 72 hour document signed : (not recorded) Time 72 hour document signed : (not recorded) Provider Notified (First and Last Name) (see details for LINK to note): (not recorded)   Psychosocial Assessment  Psychosocial Assessment Patient Complaints: Self-harm thoughts Eye Contact: Fair Facial Expression: Animated Affect: Anxious Speech: Logical/coherent Interaction: Assertive Motor Activity: Other (Comment) (WDL) Appearance/Hygiene: Unremarkable Behavior Characteristics: Cooperative Mood: Anxious, Depressed   Aggressive Behavior  Targets: (not recorded)   Thought Process  Thought Process Coherency: Circumstantial Content: Blaming self Delusions: Within Defined Limits Perception: Within Defined Limits Hallucination: None  reported or observed Judgment: Limited Confusion: None  Danger to Self/Others  Danger to Self Current suicidal ideation?: Passive Description of Suicide Plan: (not recorded) Self-Injurious Behavior: 2 Agreement Not to Harm Self: (not recorded) Description of Agreement: (not recorded) Danger to Others: None reported or observed
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
## 2024-10-10 NOTE — Plan of Care (Signed)
   Problem: Education: Goal: Knowledge of [facility] Education information/materials will improve Outcome: Progressing Goal: Emotional status will improve Outcome: Progressing Goal: Mental status will improve Outcome: Progressing

## 2024-10-10 NOTE — [facility] Group Notes (Signed)
 Jeremiah Newman attended the social work group today 10/10/24 from 1000-1100.

## 2024-10-10 NOTE — Group Note (Signed)
 Date:  10/10/2024 Time:  5:48 PM  Group Topic/Focus:  Dimensions of Wellness:   The focus of this group is to introduce the topic of wellness using collage as a creative outlet for expressing emotions, reducing anxiety, fostering group cohesion , and enabling personal insight and healing.    Participation Level:  Active  Participation Quality:  Attentive  Affect:  Appropriate  Cognitive:  Alert  Insight: Improving  Engagement in Group:  Improving  Modes of Intervention:  Activity, Discussion, Exploration, Rapport Building, Socialization, and Support    Berwyn GORMAN Acosta 10/10/2024, 5:48 PM

## 2024-10-10 NOTE — Plan of Care (Signed)
   Problem: Education: Goal: Emotional status will improve Outcome: Progressing Goal: Mental status will improve Outcome: Progressing

## 2024-10-10 NOTE — Group Note (Signed)
 [facility] LCSW Group Therapy Note   Group Date: 10/10/2024 Start Time: 1000 End Time: 1058   Type of Therapy/Topic:  Group Therapy:  Balance in Life  Participation Level:  Minimal   Description of Group:    This group will address the concept of balance and how it feels and looks when one is unbalanced. Patients will be encouraged to process areas in their lives that are out of balance, and identify reasons for remaining unbalanced. Facilitators will guide patients utilizing problem- solving interventions to address and correct the stressor making their life unbalanced. Understanding and applying boundaries will be explored and addressed for obtaining  and maintaining a balanced life. Patients will be encouraged to explore ways to assertively make their unbalanced needs known to significant others in their lives, using other group members and facilitator for support and feedback.  Therapeutic Goals: Patient will identify two or more emotions or situations they have that consume much of in their lives. Patient will identify signs/triggers that life has become out of balance:  Patient will identify two ways to set boundaries in order to achieve balance in their lives:  Patient will demonstrate ability to communicate their needs through discussion and/or role plays  Summary of Patient Progress:        Pt was present and able to identify two or more emotions  signs/triggers that life has become out of balance. Patient was able to identify ways to set boundaries in order to achieve balance in their lives  Patient demonstrated the ability to communicate needs through discussion.    Therapeutic Modalities:   Cognitive Behavioral Therapy Solution-Focused Therapy Assertiveness Training   Golda Louder, LCSW

## 2024-10-11 MED ORDER — PRAZOSIN HCL 2 MG PO CAPS
2.0000 mg | ORAL_CAPSULE | Freq: Every day | ORAL | Status: DC
  Administered 2024-10-11: 2 mg via ORAL
  Filled 2024-10-11 (×2): qty 1
  Filled 2024-10-11: qty 10

## 2024-10-11 MED ORDER — PROPRANOLOL HCL 10 MG PO TABS
10.0000 mg | ORAL_TABLET | Freq: Two times a day (BID) | ORAL | Status: DC
  Administered 2024-10-11 – 2024-10-13 (×4): 10 mg via ORAL
  Filled 2024-10-11 (×3): qty 1
  Filled 2024-10-11: qty 20
  Filled 2024-10-11: qty 1

## 2024-10-11 NOTE — Group Note (Signed)
 Date:  10/11/2024 Time:  11:09 PM  Group Topic/Focus:  Wrap-Up Group:   The focus of this group is to help patients review their daily goal of treatment and discuss progress on daily workbooks.    Participation Level:  Did Not Attend  Participation Quality:  none  Affect:  n/a  Cognitive:  n/a  Insight: None  Engagement in Group:  None  Modes of Intervention:  none  Additional Comments:   Pt was encouraged but refused to attend wrap up group  Markian Glockner A Greenlee Ancheta 10/11/2024, 11:09 PM
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
## 2024-10-11 NOTE — Plan of Care (Signed)
   Problem: Education: Goal: Emotional status will improve Outcome: Progressing Goal: Mental status will improve Outcome: Progressing

## 2024-10-11 NOTE — Progress Notes (Signed)
 Tour of Duty:  Prentice JINNY Angle, RN, 10/11/24, Tour of Duty: 0700-1900  SI/HI/AVH: [place] passive SI, denies plan or intent. Denies HI/AVH  Self-Reported   Mood: Positive  Anxiety: Denies Depression: Denies Irritability: Denies  Broset  Violence Prevention Guidelines *See Row Information*: Small Violence Risk interventions implemented   LBM  Last BM Date : 10/11/24   Pain: not present  Patient Refusals (including Rx): No  >>Shift Summary: Patient observed to be mildly animated and boisterous on unit. Patient able to make needs known. Patient observed to engage appropriately with staff and peers. Patient taking medications as prescribed. This shift, PRN medication requested or required. No reported or observed side effects to medication. No reported or observed agitation, aggression, or other acute emotional distress. No reported or observed physical abnormalities or concerns.  Last Vitals  Vitals Weight: 88 kg Temp: 98 F (36.7 C) Temp Source: Oral Pulse Rate: 73 Resp: 20 BP: 119/61 Patient Position: (not recorded)  Admission Type  Psych Admission Type (Psych Patients Only) Admission Status: Voluntary Date 72 hour document signed : (not recorded) Time 72 hour document signed : (not recorded) Provider Notified (First and Last Name) (see details for LINK to note): (not recorded)   Psychosocial Assessment  Psychosocial Assessment Patient Complaints: Self-harm thoughts Eye Contact: Fair Facial Expression: Animated Affect: Anxious Speech: Logical/coherent Interaction: Assertive Motor Activity: Other (Comment) (WDL) Appearance/Hygiene: Unremarkable Behavior Characteristics: Cooperative Mood: Anxious   Aggressive Behavior  Targets: (not recorded)   Thought Process  Thought Process Coherency: Circumstantial Content: Blaming self Delusions: Within Defined Limits Perception: Within Defined Limits Hallucination: None reported or observed Judgment:  Limited Confusion: None  Danger to Self/Others  Danger to Self Current suicidal ideation?: Passive Description of Suicide Plan: (not recorded) Self-Injurious Behavior: 2 Agreement Not to Harm Self: (not recorded) Description of Agreement: (not recorded) Danger to Others: None reported or observed

## 2024-10-11 NOTE — Progress Notes (Signed)
(  Sleep Hours) -8.75  (Any PRNs that were needed, meds refused, or side effects to meds)- hydroxyzine  25mg   (Any disturbances and when (visitation, over night)-none  (Concerns raised by the patient)- none  (SI/HI/AVH)-denies

## 2024-10-11 NOTE — Plan of Care (Signed)
   Problem: Education: Goal: Knowledge of [facility] General Education information/materials will improve Outcome: Progressing Goal: Emotional status will improve Outcome: Progressing Goal: Verbalization of understanding the information provided will improve Outcome: Progressing

## 2024-10-11 NOTE — Group Note (Signed)
 Date:  10/11/2024 Time:  10:58 AM  Group Topic/Focus:  Goals Group:   The focus of this group is to help patients establish daily goals to achieve during treatment and discuss how the patient can incorporate goal setting into their daily lives to aide in recovery.    Participation Level:  Active  Participation Quality:  Appropriate  Affect:  Appropriate  Cognitive:  Appropriate  Insight: Appropriate  Engagement in Group:  Engaged  Modes of Intervention:  Discussion  Additional Comments:  to be at peace  Nat Rummer 10/11/2024, 10:58 AM

## 2024-10-11 NOTE — Group Note (Signed)
 Date:  10/11/2024 Time:  4:55 PM  Group Topic/Focus:  Overcoming Stress:   The focus of this group is learning gratitude to overcome stress.    Participation Level:  Active  Participation Quality:  Appropriate  Affect:  Appropriate  Cognitive:  Appropriate  Insight: Appropriate  Engagement in Group:  Engaged  Modes of Intervention:  Discussion   Inocente PARAS Averi Cacioppo 10/11/2024, 4:55 PM

## 2024-10-12 MED ORDER — VITAMIN D (ERGOCALCIFEROL) 1.25 MG (50000 UNIT) PO CAPS
50000.0000 [IU] | ORAL_CAPSULE | ORAL | Status: DC
  Administered 2024-10-13: 50000 [IU] via ORAL
  Filled 2024-10-12: qty 1

## 2024-10-12 MED ORDER — ATORVASTATIN CALCIUM 10 MG PO TABS
10.0000 mg | ORAL_TABLET | Freq: Every day | ORAL | Status: DC
  Filled 2024-10-12: qty 10

## 2024-10-12 NOTE — Transportation (Signed)
 10/12/2024  Arley KATHEE Picket DOB: 25-Nov-1991 MRN: [id number]   RIDER WAIVER AND RELEASE OF LIABILITY  For the purposes of helping with transportation needs, [facility] partners with outside transportation providers (taxi companies, [employer], catering manager.) to give [facility] patients or other approved people the choice of on-demand rides Public Librarian) to our buildings for non-emergency visits.  By using [employer], I, the person signing this document, on behalf of myself and/or any legal minors (in my care using the [employer]), agree:  Science Writer given to me are supplied by independent, outside transportation providers who do not work for, or have any affiliation with, [employer]. [facility] is not a transportation company. [facility] has no control over the quality or safety of the rides I get using [employer]. [facility] has no control over whether any outside ride will happen on time or not. [facility] gives no guarantee on the reliability, quality, safety, or availability on any rides, or that no mistakes will happen. I know and accept that traveling by vehicle (car, truck, SVU, fleeta, bus, taxi, etc.) has risks of serious injuries such as disability, being paralyzed, and death. I know and agree the risk of using [employer] is mine alone, and not [employer]. [employer] are provided as is and as are available. The transportation providers are in charge for all inspections and care of the vehicles used to provide these rides. I agree not to take legal action against [facility], its agents, employees, officers, directors, representatives, insurers, attorneys, assigns, successors, subsidiaries, and affiliates at any time for any reasons related directly or indirectly to using [employer]. I also agree not to take legal action against [facility] or its affiliates for any injury, death, or damage to property caused by or related to  using [employer]. I have read this Waiver and Release of Liability, and I understand the terms used in it and their legal meaning. This Waiver is freely and voluntarily given with the understanding that my right (or any legal minors) to legal action against [facility] relating to [employer] is knowingly given up to use these services.   I attest that I read the Ride Waiver and Release of Liability to Arley KATHEE Picket, gave Mr. Aughenbaugh the opportunity to ask questions and answered the questions asked (if any). I affirm that Arley KATHEE Picket then provided consent for assistance with transportation.
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
## 2024-10-12 NOTE — Progress Notes (Signed)
   10/12/24 0818  Psych Admission Type (Psych Patients Only)  Admission Status Voluntary  Psychosocial Assessment  Patient Complaints Anxiety  Eye Contact Fair  Facial Expression Animated  Affect Appropriate to circumstance  Speech Logical/coherent  Interaction Assertive  Motor Activity Other (Comment) (WDL)  Appearance/Hygiene Unremarkable  Behavior Characteristics Cooperative  Mood Anxious  Thought Process  Content Blaming self  Delusions WDL  Perception WDL  Hallucination None reported or observed  Judgment Limited  Confusion None  Danger to Self  Current suicidal ideation? Denies

## 2024-10-12 NOTE — Group Note (Deleted)
 Date:  10/12/2024 Time:  9:59 AM  Group Topic/Focus:  Goals Group:   The focus of this group is to help patients establish daily goals to achieve during treatment and discuss how the patient can incorporate goal setting into their daily lives to aide in recovery. Orientation:   The focus of this group is to educate the patient on the purpose and policies of crisis stabilization and provide a format to answer questions about their admission.  The group details unit policies and expectations of patients while admitted.     Participation Level:  {BHH PARTICIPATION OZCZO:77735}  Participation Quality:  {BHH PARTICIPATION QUALITY:22265}  Affect:  {BHH AFFECT:22266}  Cognitive:  {BHH COGNITIVE:22267}  Insight: {BHH Insight2:20797}  Engagement in Group:  {BHH ENGAGEMENT IN HMNLE:77731}  Modes of Intervention:  {BHH MODES OF INTERVENTION:22269}  Additional Comments:  ***  Jontez Redfield M Japheth Diekman 10/12/2024, 9:59 AM
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
## 2024-10-12 NOTE — Group Note (Signed)
 Date:  10/12/2024 Time:  4:33 PM  Group Topic/Focus: Occupational therapy    Occupational therapy in mental health focuses on helping clients develop skills, habits, and routines that support emotional well-being, social participation, and daily functioning. Group therapy provides a supportive environment for peer learning, connection, and practice of coping skills.  Participation Level:  Active  Additional Comments:  Patient attended  Jeremiah Newman 10/12/2024, 4:33 PM

## 2024-10-12 NOTE — Progress Notes (Signed)
 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   Spoke with patient who confirms continued interest in pursuing treatment with this facility and feels ready for discharge tomorrow.  Spoke with MD Bouchard regarding [employer]hound transportation, no concerns of note. Pt had no concerns with taking the [employer] as he has done this before.  [employer] bus ticket was purchased by [employer]. Will leave [name] bus depot tomorrow 11/11 at 9:30AM and arrive in [place], [place] tomorrow night at 9:50PM. Eleanor states their staff will pick him up from [facility] and transition him to [employer] [phone] [place], [place], [place] [address]).  Bus ticket has been printed and placed on the front of pt's chart. Copy of ticket and photo of patient has been sent secure with pt's permission to Melissa at [email or web address] to ensure a smooth transition from [place] to treatment facility.   No other needs at this time.   Signed:  Carrina Schoenberger, LCSW-A 10/12/2024  10:28 AM

## 2024-10-12 NOTE — Group Note (Signed)
 Date:  10/12/2024 Time:  11:11 AM  Group Topic/Focus: Recreational Therapy    this activity helps patients practice communication, critical thinking, and teamwork. It encourages collaborative problem-solving and the need for compromise, as participants work together to achieve a shared goal.  Participation Level:  Active   Additional Comments:  Patient attended  Jeremiah Newman 10/12/2024, 11:11 AM

## 2024-10-12 NOTE — Progress Notes (Signed)
 [facility] MD Progress Note  10/12/2024 3:40 PM Jeremiah Newman  MRN:  [id number]  Principal Problem: Substance induced mood disorder (HCC) Diagnosis: Principal Problem:   Substance induced mood disorder (HCC) Active Problems:   PTSD (post-traumatic stress disorder)   Schizoaffective disorder (HCC)  Jeremiah Newman is a 33 y.o. Caucasian male with prior psychiatric history significant for schizoaffective disorder, polysubstance use disorder, PTSD, substance-induced psychotic disorder, and opioid use disorder.  Patient presents voluntarily to [facility] from [facility] ED for worsening depression resulting in suicidal ideation and 2 interrupted suicide attempts on Tuesday in the context of psychosocial stressors and family altercation.  BAL: Less than 15 and UDS: Negative for substances.    24-hour chart review: Patient case discussed in interdisciplinary team meeting.  Vital signs without critical values.  No agitation protocol required.  Patient requires nicotine  gum for smoking cessation x 2.  Compliant with psychotropic medications without side effects.  Today's assessment notes: On assessment today, the patient reports that his mood is euthymic and has improved since admission. He states that his anxiety symptoms are currently at a manageable level. He reports stable sleep, appetite, and energy, with no difficulties in concentration. The patient denies suicidal ideation, intent, or plan, as well as homicidal ideation or psychotic symptoms. He denies experiencing any side effects from his current psychiatric medications. The patient expresses anticipation and motivation for discharge to residential rehabilitation tomorrow. He was informed that he is planned to leave via [employer] bus at 9:30 AM, arriving in [place], [place]  around 9:30 PM. Discussed with patient the importance of medication adherence and attending follow-up appointments to prevent  symptom exacerbation. We discussed psychosocial stressors, including being around family members who reportedly misuse alcohol. Discussed the critical importance of abstaining from alcohol and other substances to support recovery, improve stability, enhance cognitive function, promote better sleep, and reduce anxiety.  Total Time spent with patient: 45 minutes   Past Medical History:  Past Medical History:  Diagnosis Date   Schizophrenia (HCC)    History reviewed. No pertinent surgical history. Family History: History reviewed. No pertinent family history. Family Psychiatric  History: See H&P Social History:  Social History   Substance and Sexual Activity  Alcohol Use Not Currently     Social History   Substance and Sexual Activity  Drug Use No    Social History   Socioeconomic History   Marital status: Single    Spouse name: Not on file   Number of children: Not on file   Years of education: Not on file   Highest education level: Not on file  Occupational History   Not on file  Tobacco Use   Smoking status: Some Days    Current packs/day: 1.00    Average packs/day: 1 pack/day for 9.9 years (9.9 ttl pk-yrs)    Types: Cigarettes    Start date: 2016   Smokeless tobacco: Never  Vaping Use   Vaping status: Every Day  Substance and Sexual Activity   Alcohol use: Not Currently   Drug use: No   Sexual activity: Not Currently  Other Topics Concern   Not on file  Social History Narrative   Not on file   Social Drivers of Health   Financial Resource Strain: High Risk (06/04/2024)   Received from [facility]   Overall Financial Resource Strain (CARDIA)    How hard is it for you to pay for the very basics like food, housing, medical care, and  heating?: Hard  Food Insecurity: No Food Insecurity (10/07/2024)   Hunger Vital Sign    Worried About Running Out of Food in the Last Year: Never true    Ran Out of Food in the Last Year: Never true  Transportation Needs: No  Transportation Needs (10/08/2024)   PRAPARE - Administrator, Civil Service (Medical): No    Lack of Transportation (Non-Medical): No  Physical Activity: Inactive (02/21/2024)   Received from [facility]   Exercise Vital Sign    On average, how many days per week do you engage in moderate to strenuous exercise (like a brisk walk)?: 0 days    On average, how many minutes do you engage in exercise at this level?: 0 min  Stress: Stress Concern Present (02/21/2024)   Received from [facility]   [facility] - Occupational Stress Questionnaire    Feeling of Stress : Very much  Social Connections: Moderately Integrated (02/21/2024)   Received from [facility]   Social Connection and Isolation Panel    In a typical week, how many times do you talk on the phone with family, friends, or neighbors?: More than three times a week    How often do you get together with friends or relatives?: Twice a week    How often do you attend church or religious services?: More than 4 times per year    Do you belong to any clubs or organizations such as church groups, unions, fraternal or athletic groups, or school groups?: Yes    How often do you attend meetings of the clubs or organizations you belong to?: More than 4 times per year    Are you married, widowed, divorced, separated, never married, or living with a partner?: Never married   Additional Social History:    Sleep: Good Estimated Sleeping Duration (Last 24 Hours): 7.25-9.50 hours (Due to [employer] Time, the durations displayed may not accurately represent documentation during the time change interval)  Appetite:  Good  Current Medications: Current Facility-Administered Medications  Medication Dose Route Frequency Provider Last Rate Last Admin   acetaminophen  (TYLENOL ) tablet 650 mg  650 mg Oral Q6H PRN Smith, Annie B, NP       alum & mag hydroxide-simeth  (MAALOX/MYLANTA) 200-200-20 MG/5ML suspension 30 mL  30 mL Oral Q4H PRN Smith, Annie B, NP       busPIRone (BUSPAR) tablet 7.5 mg  7.5 mg Oral BID Ntuen, Tina C, FNP   7.5 mg at 10/12/24 0818   haloperidol  (HALDOL ) tablet 5 mg  5 mg Oral TID PRN Smith, Annie B, NP       And   diphenhydrAMINE  (BENADRYL ) capsule 50 mg  50 mg Oral TID PRN Smith, Annie B, NP       haloperidol  lactate (HALDOL ) injection 5 mg  5 mg Intramuscular TID PRN Smith, Annie B, NP       And   diphenhydrAMINE  (BENADRYL ) injection 50 mg  50 mg Intramuscular TID PRN Smith, Annie B, NP       And   LORazepam  (ATIVAN ) injection 2 mg  2 mg Intramuscular TID PRN Smith, Annie B, NP       haloperidol  lactate (HALDOL ) injection 10 mg  10 mg Intramuscular TID PRN Smith, Annie B, NP       And   diphenhydrAMINE  (BENADRYL ) injection 50 mg  50 mg Intramuscular TID PRN Smith, Annie B, NP  And   LORazepam  (ATIVAN ) injection 2 mg  2 mg Intramuscular TID PRN Smith, Annie B, NP       hydrOXYzine  (ATARAX ) tablet 25 mg  25 mg Oral TID PRN Smith, Annie B, NP   25 mg at 10/11/24 2109   magnesium  hydroxide (MILK OF MAGNESIA) suspension 30 mL  30 mL Oral Daily PRN Smith, Annie B, NP       nicotine  (NICODERM CQ  - dosed in mg/24 hours) patch 14 mg  14 mg Transdermal Daily Smith, Annie B, NP   14 mg at 10/12/24 9181   nicotine  polacrilex (NICORETTE ) gum 2 mg  2 mg Oral PRN Ntuen, Tina C, FNP   2 mg at 10/12/24 1239   OLANZapine  (ZYPREXA ) tablet 5 mg  5 mg Oral QHS Ji, Andrew, MD   5 mg at 10/11/24 2109   prazosin  (MINIPRESS ) capsule 2 mg  2 mg Oral QHS Ji, Andrew, MD   2 mg at 10/11/24 2109   propranolol (INDERAL) tablet 10 mg  10 mg Oral BID Lynnette Barter, MD   10 mg at 10/12/24 0818   sertraline (ZOLOFT) tablet 100 mg  100 mg Oral Daily Smith, Annie B, NP   100 mg at 10/12/24 9181    Lab Results:  No results found for this or any previous visit (from the past 48 hours).  Blood Alcohol level:  Lab Results  Component Value Date   [facility] <15  10/06/2024   ETH <15 05/21/2024   Metabolic Disorder Labs: Lab Results  Component Value Date   HGBA1C 4.7 (L) 12/23/2023   MPG 88.19 12/23/2023   MPG 93.93 10/10/2023   No results found for: PROLACTIN Lab Results  Component Value Date   CHOL 163 10/08/2024   TRIG 340 (H) 10/08/2024   HDL 29 (L) 10/08/2024   CHOLHDL 5.6 10/08/2024   VLDL 68 (H) 10/08/2024   LDLCALC 66 10/08/2024   LDLCALC 116 (H) 12/23/2023   Physical Findings: AIMS:  ,  ,  ,  ,  ,  ,   CIWA:    COWS:     Musculoskeletal: Strength & Muscle Tone: within normal limits Gait & Station: normal Patient leans: N/A  Psychiatric Specialty Exam:  Presentation  General Appearance:  Appropriate for Environment; Casual; Fairly Groomed  Eye Contact: Good  Speech: Clear and Coherent; Normal Rate  Speech Volume: Normal  Handedness: Right  Mood and Affect  Mood: Euthymic  Affect: Appropriate; Congruent  Thought Process  Thought Processes: Coherent  Descriptions of Associations:Circumstantial  Orientation:Full (Time, Place and Person)  Thought Content:Logical  History of Schizophrenia/Schizoaffective disorder:Yes  Duration of Psychotic Symptoms:Greater than six months  Hallucinations:Hallucinations: None   Ideas of Reference:None  Suicidal Thoughts:Suicidal Thoughts: No SI Passive Intent and/or Plan: -- (Denies)   Homicidal Thoughts:Homicidal Thoughts: No   Sensorium  Memory: Immediate Good; Recent Good  Judgment: Fair  Insight: Fair  Art Therapist  Concentration: Good  Attention Span: Good  Recall: Good  Fund of Knowledge: Good  Language: Good  Psychomotor Activity  Psychomotor Activity: Psychomotor Activity: Normal   Assets  Assets: Desire for Improvement; Communication Skills; Physical Health; Resilience  Sleep  Sleep: Sleep: Good   Physical Exam: Physical Exam Vitals and nursing note reviewed.  Constitutional:      General: He is  not in acute distress.    Appearance: He is not ill-appearing.  HENT:     Mouth/Throat:     Pharynx: Oropharynx is clear.  Cardiovascular:     Rate  and Rhythm: Normal rate.     Pulses: Normal pulses.  Pulmonary:     Effort: No respiratory distress.  Neurological:     Mental Status: He is alert and oriented to person, place, and time.    Review of Systems  Constitutional:  Negative for chills and fever.  HENT:  Negative for sore throat.   Eyes:  Negative for blurred vision.  Respiratory:  Negative for cough, sputum production, shortness of breath and wheezing.   Cardiovascular:  Negative for chest pain and palpitations.  Gastrointestinal:  Negative for heartburn and nausea.  Genitourinary:  Negative for dysuria.  Musculoskeletal:  Negative for falls.  Skin:  Negative for itching and rash.  Neurological:  Negative for dizziness and headaches.  Endo/Heme/Allergies: Negative.   Psychiatric/Behavioral:  Positive for depression. Negative for hallucinations, substance abuse and suicidal ideas. The patient is nervous/anxious. The patient does not have insomnia.    Blood pressure 113/62, pulse 63, temperature 98 F (36.7 C), resp. rate 20, height 5' 8 (1.727 m), weight 88 kg, SpO2 97%. Body mass index is 29.5 kg/m.  Treatment Plan Summary: Daily contact with patient to assess and evaluate symptoms and progress in treatment and Medication management  Observation Level/Precautions:  15 minute checks  Laboratory:   CBC: Within normal limits Chemistry Profile: Total protein 8.2 elevated, otherwise normal Folic Acid: N/A GGT: N/A HbAIC: 4.247 normal HCG: N/A UDS: No substances dictated UA: Ordered Vitamin B-12: Ordered TSH: Ordered Lipid panel: Ordered Vitamin D 25-hydroxy: Ordered   EKG: Sinus bradycardia with sinus arrhythmia, rate 58, QT/QTc 396/388  Psychotherapy: Therapeutic milieu  Medications: See [facility]  Consultations: Pending   Discharge Concerns: Safety  EDD:  10/13/2024  Other:      Assessment: Jeremiah Newman is a 33 y.o. Caucasian male with prior psychiatric history significant for stimulant use disorder, opioid use disorder, PTSD, substance-induced psychotic disorder.  Patient presents voluntarily to [facility] from [facility] ED for worsening depression resulting in suicidal ideation and 2 interrupted suicide attempts on Tuesday in the context of psychosocial stressors and family altercation.  BAL: Less than 15 and UDS: Negative for substances.   Update 11/10: Patient was observed attending unit groups and interacting appropriately. Overall appears stable. Mood is improved and euthymic. Anxiety is reported as manageable. Sleep, appetite, energy, and concentration are stable. No suicidal or homicidal ideation, intent, or psychotic symptoms noted. Patient is motivated for recovery. He is scheduled to return to [facility], [place]  for residential treatment, tomorrow, November 11, via [employer] bus with a seven-day supply of medications. Aftercare planning finalized, emphasizing adherence to treatment, abstinence from substances, and follow-up care. Current psychiatric treatment regimen will continue; no changes are indicated at this time.   Physician Treatment Plan for Primary Diagnosis: Substance induced mood disorder   Plans: Medications: -- Continue BuSpar tablet 7.5 mg p.o. twice daily for anxiety --Continue Olanzapine  tablet 5 mg p.o. nightly for psychosis -- Continue Zoloft tablet 100 mg p.o. daily for depression and anxiety -- increase prazosin  to 2 mg at bedtime for PTSD nightmares   Continue BH Agitation Protocol    Medication for other medical problems: -- Continue atorvastatin (LIPITOR) tablet 10 mg daily for hyperlipidemia --Continue nicotine  transdermal patch 21 mg transdermally every 24 hours for smoking cessation --Continue nicotine  gum 2 mg as needed for smoking cessation -- Continue  propranolol tablet 10 mg p.o. daily for anxiety and hypertension   Other PRN Medications  -Acetaminophen  650 mg every 6 as needed/mild  pain  -Maalox 30 mL oral every 4 as needed/digestion  -Magnesium  hydroxide 30 mL daily as needed/mild constipation    --The risks/benefits/side-effects/alternatives to this medication were discussed in detail with the patient and time was given for questions. The patient consents to medication trial.   -- Metabolic profile and EKG monitoring obtained while on an atypical antipsychotic (BMI: Lipid Panel: HbgA1c: QTc:)   -- Encouraged patient to participate in unit milieu and in scheduled group therapies    Safety and Monitoring:  Voluntary admission to inpatient psychiatric unit for safety, stabilization and treatment  Daily contact with patient to assess and evaluate symptoms and progress in treatment  Patient's case to be discussed in multi-disciplinary team meeting  Observation Level : q15 minute checks  Vital signs: q12 hours  Precautions: suicide, but pt currently verbally contracts for safety on unit?    Discharge Planning:  Social work and case management to assist with discharge planning and identification of hospital follow-up needs prior to discharge  Estimated LOS: 5-7?days  Discharge Concerns: Need to establish Safety plan; Medication compliance and effectiveness  Discharge Goals: Return home with outpatient referrals for mental health follow-up including medication management/psychotherapy.    Long Term Goal(s): Improvement in symptoms so as ready for discharge   Short Term Goals: Ability to identify changes in lifestyle to reduce recurrence of condition will improve, Ability to verbalize feelings will improve, Ability to disclose and discuss suicidal ideas, Ability to demonstrate self-control will improve, Ability to identify and develop effective coping behaviors will improve, Ability to maintain clinical measurements within normal limits will  improve, Compliance with prescribed medications will improve, and Ability to identify triggers associated with substance abuse/mental health issues will improve   Physician Treatment Plan for Secondary Diagnosis:  Principal Problem: PTSD Active diagnosis: Substance-induced mood disorder Suicidal ideation PTSD Polysubstance use disorder   I certify that inpatient services furnished can reasonably be expected to improve the patient's condition.    Blair Chiquita Hint, NP 10/12/2024, 3:40 PM   Patient ID: Arley KATHEE Picket, male   DOB: [birth year], 33 y.o.   MRN: [id number]

## 2024-10-12 NOTE — Group Note (Signed)
 Date:  10/12/2024 Time:  11:47 AM  Group Topic/Focus: Emotional physical wellness  These two aspects are intertwined; emotional distress can affect the body, while physical health problems can affect emotional well-being.  Participation Level:  Active  Participation Quality:  Appropriate  Affect:  Appropriate  Cognitive:  Appropriate  Insight: Appropriate  Engagement in Group:  Engaged  Modes of Intervention:  Discussion  Additional Comments:    Jeremiah Newman 10/12/2024, 11:47 AM

## 2024-10-12 NOTE — Group Note (Signed)
 Recreation Therapy Group Note   Group Topic:Communication  Group Date: 10/12/2024 Start Time: 0935 End Time: 1000 Facilitators: Harlis Champoux-McCall, LRT,CTRS Location: [address] Dayroom   Group Topic: Communication, Team Building, Problem Solving  Goal Area(s) Addresses:  Patient will effectively work with peer towards shared goal.  Patient will identify skills used to make activity successful.  Patient will identify how skills used during activity can be applied to reach post d/c goals.   Behavioral Response: Engaged  Intervention: STEM Activity- Glass Blower/designer  Activity: Tallest [employer]. In teams of 5-6, patients were given 11 craft pipe cleaners. Using the materials provided, patients were instructed to compete again the opposing team(s) to build the tallest free-standing structure from floor level. The activity was timed; difficulty increased by clinical research associate as production designer, theatre/television/film continued.  Systematically resources were removed with additional directions for example, placing one arm behind their back, working in silence, and shape stipulations. LRT facilitated post-activity discussion reviewing team processes and necessary communication skills involved in completion. Patients were encouraged to reflect how the skills utilized, or not utilized, in this activity can be incorporated to positively impact support systems post discharge.  Education: Pharmacist, Community, Scientist, Physiological, Discharge Planning   Education Outcome: Acknowledges education/In group clarification offered/Needs additional education.    Affect/Mood: Appropriate   Participation Level: Engaged   Participation Quality: Independent   Behavior: Appropriate   Speech/Thought Process: Focused   Insight: Good   Judgement: Good   Modes of Intervention: STEM Activity   Patient Response to Interventions:  Engaged   Education Outcome:  In group clarification offered    Clinical  Observations/Individualized Feedback: Pt was bright and comedic at times during group session. Pt worked well with peers in creating a structure that met the criteria. Pt also stated, in a comedic way, during group that his group cheated to create their tower.    Plan: Continue to engage patient in RT group sessions 2-3x/week.   Latrisha Coiro-McCall, LRT,CTRS 10/12/2024 11:54 AM

## 2024-10-12 NOTE — Group Note (Signed)
 Date:  10/12/2024 Time:  3:55 PM  Group Topic/Focus: Chaplin Spirituality:   The focus of this group is to discuss how one's spirituality can aide in recovery.    Participation Level:  Active    Additional Comments:  Patient attended   Jeremiah Newman 10/12/2024, 3:55 PM

## 2024-10-12 NOTE — Group Note (Signed)
 Date:  10/12/2024 Time:  9:22 PM  Group Topic/Focus:  Wrap-Up Group:   The focus of this group is to help patients review their daily goal of treatment and discuss progress on daily workbooks.    Participation Level:  Did Not Attend  Participation Quality:  none  Affect:  n/a  Cognitive:  n/a  Insight: None  Engagement in Group:  None  Modes of Intervention:  none  Additional Comments:   Pt was encouraged but refused to attend wrap up group   Mackinley Cassaday A Deashia Soule 10/12/2024, 9:22 PM

## 2024-10-12 NOTE — Group Note (Signed)
 Occupational Therapy Group Note  Group Topic:Coping Skills  Group Date: 10/12/2024 Start Time: 1500 End Time: 1538 Facilitators: Dot Dallas MATSU, OT   Group Description: Group encouraged increased engagement and participation through discussion and activity focused on Coping Ahead. Patients were split up into teams and selected a card from a stack of positive coping strategies. Patients were instructed to act out/charade the coping skill for other peers to guess and receive points for their team. Discussion followed with a focus on identifying additional positive coping strategies and patients shared how they were going to cope ahead over the weekend while continuing hospitalization stay.  Therapeutic Goal(s): Identify positive vs negative coping strategies. Identify coping skills to be used during hospitalization vs coping skills outside of hospital/at home Increase participation in therapeutic group environment and promote engagement in treatment   Participation Level: Engaged   Participation Quality: Independent   Behavior: Appropriate   Speech/Thought Process: Relevant   Affect/Mood: Appropriate   Insight: Fair   Judgement: Fair      Modes of Intervention: Education  Patient Response to Interventions:  Attentive   Plan: Continue to engage patient in OT groups 2 - 3x/week.  10/12/2024  D[place] MATSU Dot, OT  Haniyyah Sakuma, OT

## 2024-10-12 NOTE — Group Note (Signed)
 Date:  10/12/2024 Time:  10:25 AM  Group Topic/Focus:  Emotional Goals and Wellness orientation  Goals Group:   The focus of this group is to help patients establish daily goals to achieve during treatment and discuss how the patient can incorporate goal setting into their daily lives to aide in recovery. Orientation:   The focus of this group is to educate the patient on the purpose and policies of crisis stabilization and provide a format to answer questions about their admission.  The group details unit policies and expectations of patients while admitted.    Participation Level:  Active  Participation Quality:  Appropriate  Affect:  Appropriate  Cognitive:  Alert and Appropriate  Insight: Appropriate  Engagement in Group:  Engaged  Modes of Intervention:  Discussion and Orientation  Additional Comments:  patient attended   Jeremiah Newman 10/12/2024, 10:25 AM

## 2024-10-12 NOTE — Progress Notes (Signed)
  [facility] Adult Case Management Discharge Plan :  Will you be returning to the same living situation after discharge:  No. At discharge, do you have transportation home?: Yes,  CSW arranged [employer] taxi for 9am to [place] [employer], [facility] purchased greyhound ticket to [place] Do you have the ability to pay for your medications: No.  Release of information consent forms completed and in the chart;  Patient's signature needed at discharge.  Patient to Follow up at:  Follow-up Information     [facility] Follow up.   Why: You may call this provider to schedule a hospital follow up appointment.  Appointments are held in person.  Following this appointment, you will be scheduled for a clinical assessment to obtain therapy and medication management services. Contact information: [address] [place] [address] [phone]         [facility] Follow up.   Why: You have been accepted to substance use treatment with this program and will admit there on 10/13/24. This program is 12 months. Contact information: Phone: [phone] (main number for admissions)  Numbers for [place] location: [phone] and [phone]  Address of facility: [address] [place], [place], [place] [address]                Next level of care provider has access to [facility] Link:no  Safety Planning and Suicide Prevention discussed: Yes,  reviewed with patient prior to discharge      Has patient been referred to the Quitline?: Patient refused referral for treatment  Patient has been referred for addiction treatment: Will admit to [facility] for substance use treatment on 10/13/24  Jenkins LULLA Primer, LCSWA 10/12/2024, 3:42 PM

## 2024-10-13 DIAGNOSIS — F1994 Other psychoactive substance use, unspecified with psychoactive substance-induced mood disorder: Secondary | ICD-10-CM

## 2024-10-13 DIAGNOSIS — F259 Schizoaffective disorder, unspecified: Principal | ICD-10-CM

## 2024-10-13 DIAGNOSIS — F4310 Post-traumatic stress disorder, unspecified: Secondary | ICD-10-CM

## 2024-10-13 MED ORDER — PROPRANOLOL HCL 10 MG PO TABS: 10.0000 mg | ORAL_TABLET | Freq: Two times a day (BID) | ORAL | 0 refills | Status: AC

## 2024-10-13 MED ORDER — SERTRALINE HCL 100 MG PO TABS: 100.0000 mg | ORAL_TABLET | Freq: Every day | ORAL | 0 refills | Status: DC

## 2024-10-13 MED ORDER — OLANZAPINE 5 MG PO TABS: 5.0000 mg | ORAL_TABLET | Freq: Every day | ORAL | 0 refills | Status: AC

## 2024-10-13 MED ORDER — NICOTINE POLACRILEX 2 MG MT GUM: 2.0000 mg | CHEWING_GUM | OROMUCOSAL | Status: AC | PRN

## 2024-10-13 MED ORDER — ATORVASTATIN CALCIUM 10 MG PO TABS: 10.0000 mg | ORAL_TABLET | Freq: Every day | ORAL | 0 refills | Status: AC

## 2024-10-13 MED ORDER — BUSPIRONE HCL 7.5 MG PO TABS: 7.5000 mg | ORAL_TABLET | Freq: Two times a day (BID) | ORAL | 0 refills | Status: DC

## 2024-10-13 MED ORDER — BUSPIRONE HCL 7.5 MG PO TABS: 7.5000 mg | ORAL_TABLET | Freq: Two times a day (BID) | ORAL | 0 refills | Status: AC

## 2024-10-13 MED ORDER — NICOTINE 14 MG/24HR TD PT24: 14.0000 mg | MEDICATED_PATCH | Freq: Every day | TRANSDERMAL | Status: AC

## 2024-10-13 MED ORDER — PROPRANOLOL HCL 10 MG PO TABS: 10.0000 mg | ORAL_TABLET | Freq: Two times a day (BID) | ORAL | 0 refills | Status: DC

## 2024-10-13 MED ORDER — ATORVASTATIN CALCIUM 10 MG PO TABS: 10.0000 mg | ORAL_TABLET | Freq: Every day | ORAL | 0 refills | Status: DC

## 2024-10-13 MED ORDER — PRAZOSIN HCL 2 MG PO CAPS: 2.0000 mg | ORAL_CAPSULE | Freq: Every day | ORAL | 0 refills | Status: DC

## 2024-10-13 MED ORDER — OLANZAPINE 5 MG PO TABS: 5.0000 mg | ORAL_TABLET | Freq: Every day | ORAL | 0 refills | Status: DC

## 2024-10-13 MED ORDER — VITAMIN D (ERGOCALCIFEROL) 1.25 MG (50000 UNIT) PO CAPS
50000.0000 [IU] | ORAL_CAPSULE | ORAL | 0 refills | Status: DC
Start: 2024-10-13 — End: 2024-10-13

## 2024-10-13 MED ORDER — SERTRALINE HCL 100 MG PO TABS: 100.0000 mg | ORAL_TABLET | Freq: Every day | ORAL | 0 refills | Status: AC

## 2024-10-13 MED ORDER — VITAMIN D (ERGOCALCIFEROL) 1.25 MG (50000 UNIT) PO CAPS: 50000.0000 [IU] | ORAL_CAPSULE | ORAL | 0 refills | Status: AC

## 2024-10-13 MED ORDER — PRAZOSIN HCL 2 MG PO CAPS: 2.0000 mg | ORAL_CAPSULE | Freq: Every day | ORAL | 0 refills | Status: AC

## 2024-10-13 NOTE — Progress Notes (Signed)
   10/13/24 0800  Psych Admission Type (Psych Patients Only)  Admission Status Voluntary  Psychosocial Assessment  Patient Complaints Anxiety  Eye Contact Fair  Facial Expression Animated  Affect Appropriate to circumstance  Speech Logical/coherent  Interaction Assertive  Motor Activity Slow  Appearance/Hygiene Unremarkable  Behavior Characteristics Cooperative  Mood Anxious  Thought Process  Coherency WDL  Content WDL  Delusions None reported or observed  Perception WDL  Hallucination None reported or observed  Judgment Limited  Confusion None  Danger to Self  Current suicidal ideation? Denies  Description of Suicide Plan No Plan  Agreement Not to Harm Self Yes  Description of Agreement Verbal  Danger to Others  Danger to Others None reported or observed

## 2024-10-13 NOTE — Plan of Care (Signed)
   Problem: Education: Goal: Knowledge of [facility] Education information/materials will improve Outcome: Progressing Goal: Emotional status will improve Outcome: Progressing Goal: Mental status will improve Outcome: Progressing Goal: Verbalization of understanding the information provided will improve Outcome: Progressing

## 2024-10-13 NOTE — Progress Notes (Signed)
 Discharge Note:  Pt discharged to lobby. Pt was stable and appreciative at that time. All papers and prescriptions were given and valuables returned. Verbal understanding expressed. Denies SI/HI and A/VH. Pt given opportunity to express concerns and ask questions.

## 2024-10-13 NOTE — Progress Notes (Signed)
 (  Sleep Hours) - 8.5 (Any PRNs that were needed, meds refused, or side effects to meds)- none (Any disturbances and when (visitation, over night)- none (Concerns raised by the patient)- none  (SI/HI/AVH)- denies

## 2024-10-13 NOTE — Plan of Care (Signed)
  Problem: Education: Goal: Knowledge of [facility] Education information/materials will improve Outcome: Adequate for Discharge Goal: Emotional status will improve Outcome: Adequate for Discharge Goal: Mental status will improve Outcome: Adequate for Discharge Goal: Verbalization of understanding the information provided will improve Outcome: Adequate for Discharge   Problem: Activity: Goal: Interest or engagement in activities will improve Outcome: Adequate for Discharge Goal: Sleeping patterns will improve Outcome: Adequate for Discharge   Problem: Coping: Goal: Ability to verbalize frustrations and anger appropriately will improve Outcome: Adequate for Discharge Goal: Ability to demonstrate self-control will improve Outcome: Adequate for Discharge   Problem: Health Behavior/Discharge Planning: Goal: Identification of resources available to assist in meeting health care needs will improve Outcome: Adequate for Discharge Goal: Compliance with treatment plan for underlying cause of condition will improve Outcome: Adequate for Discharge   Problem: Physical Regulation: Goal: Ability to maintain clinical measurements within normal limits will improve Outcome: Adequate for Discharge   Problem: Safety: Goal: Periods of time without injury will increase Outcome: Adequate for Discharge   Problem: Education: Goal: Knowledge of [facility] Education information/materials will improve Outcome: Adequate for Discharge Goal: Emotional status will improve Outcome: Adequate for Discharge Goal: Mental status will improve Outcome: Adequate for Discharge Goal: Verbalization of understanding the information provided will improve Outcome: Adequate for Discharge   Problem: Activity: Goal: Interest or engagement in activities will improve Outcome: Adequate for Discharge Goal: Sleeping patterns will improve Outcome: Adequate for Discharge   Problem: Coping: Goal:  Ability to verbalize frustrations and anger appropriately will improve Outcome: Adequate for Discharge Goal: Ability to demonstrate self-control will improve Outcome: Adequate for Discharge   Problem: Health Behavior/Discharge Planning: Goal: Identification of resources available to assist in meeting health care needs will improve Outcome: Adequate for Discharge Goal: Compliance with treatment plan for underlying cause of condition will improve Outcome: Adequate for Discharge   Problem: Physical Regulation: Goal: Ability to maintain clinical measurements within normal limits will improve Outcome: Adequate for Discharge   Problem: Safety: Goal: Periods of time without injury will increase Outcome: Adequate for Discharge   Problem: Education: Goal: Ability to make informed decisions regarding treatment will improve Outcome: Adequate for Discharge   Problem: Coping: Goal: Coping ability will improve Outcome: Adequate for Discharge   Problem: Health Behavior/Discharge Planning: Goal: Identification of resources available to assist in meeting health care needs will improve Outcome: Adequate for Discharge   Problem: Medication: Goal: Compliance with prescribed medication regimen will improve Outcome: Adequate for Discharge   Problem: Self-Concept: Goal: Ability to disclose and discuss suicidal ideas will improve Outcome: Adequate for Discharge Goal: Will verbalize positive feelings about self Outcome: Adequate for Discharge   Problem: Education: Goal: Knowledge of disease or condition will improve Outcome: Adequate for Discharge Goal: Understanding of discharge needs will improve Outcome: Adequate for Discharge   Problem: Health Behavior/Discharge Planning: Goal: Ability to identify changes in lifestyle to reduce recurrence of condition will improve Outcome: Adequate for Discharge Goal: Identification of resources available to assist in meeting health care needs will  improve Outcome: Adequate for Discharge   Problem: Physical Regulation: Goal: Complications related to the disease process, condition or treatment will be avoided or minimized Outcome: Adequate for Discharge   Problem: Safety: Goal: Ability to remain free from injury will improve Outcome: Adequate for Discharge

## 2024-10-19 NOTE — Progress Notes (Signed)
 Spiritual care group on grief and loss facilitated by Chaplain Rockie Sofia, Bcc  Group Goal: Support / Education around grief and loss  Members engage in facilitated group support and psycho-social education.  Group Description:  Following introductions and group rules, group members engaged in facilitated group dialogue and support around topic of loss, with particular support around experiences of loss in their lives. Group Identified types of loss (relationships / self / things) and identified patterns, circumstances, and changes that precipitate losses. Reflected on thoughts / feelings around loss, normalized grief responses, and recognized variety in grief experience. Group encouraged individual reflection on safe space and on the coping skills that they are already utilizing.  Group drew on Adlerian / Rogerian and narrative framework  Patient Progress: Jeremiah Newman attended group and actively engaged and participated in group conversation and activities.
